# Patient Record
Sex: Female | Born: 1965
Health system: Southern US, Community
[De-identification: ages and names within clinical notes are randomized; demographics above are authoritative.]

## PROBLEM LIST (undated history)

## (undated) DIAGNOSIS — K760 Fatty (change of) liver, not elsewhere classified: Secondary | ICD-10-CM

## (undated) DIAGNOSIS — J449 Chronic obstructive pulmonary disease, unspecified: Secondary | ICD-10-CM

## (undated) DIAGNOSIS — G4730 Sleep apnea, unspecified: Secondary | ICD-10-CM

## (undated) DIAGNOSIS — C649 Malignant neoplasm of unspecified kidney, except renal pelvis: Secondary | ICD-10-CM

## (undated) DIAGNOSIS — I440 Atrioventricular block, first degree: Secondary | ICD-10-CM

## (undated) DIAGNOSIS — M1990 Unspecified osteoarthritis, unspecified site: Secondary | ICD-10-CM

## (undated) DIAGNOSIS — N289 Disorder of kidney and ureter, unspecified: Secondary | ICD-10-CM

## (undated) DIAGNOSIS — J45909 Unspecified asthma, uncomplicated: Secondary | ICD-10-CM

## (undated) DIAGNOSIS — K219 Gastro-esophageal reflux disease without esophagitis: Secondary | ICD-10-CM

## (undated) DIAGNOSIS — J309 Allergic rhinitis, unspecified: Secondary | ICD-10-CM

## (undated) DIAGNOSIS — F909 Attention-deficit hyperactivity disorder, unspecified type: Secondary | ICD-10-CM

## (undated) DIAGNOSIS — E039 Hypothyroidism, unspecified: Secondary | ICD-10-CM

## (undated) DIAGNOSIS — R251 Tremor, unspecified: Secondary | ICD-10-CM

## (undated) DIAGNOSIS — M069 Rheumatoid arthritis, unspecified: Secondary | ICD-10-CM

## (undated) DIAGNOSIS — Z8719 Personal history of other diseases of the digestive system: Secondary | ICD-10-CM

## (undated) DIAGNOSIS — E860 Dehydration: Secondary | ICD-10-CM

## (undated) DIAGNOSIS — N182 Chronic kidney disease, stage 2 (mild): Secondary | ICD-10-CM

## (undated) HISTORY — PX: WRIST SURGERY: SHX841

## (undated) HISTORY — PX: ELBOW SURGERY: SHX618

## (undated) HISTORY — DX: Chronic obstructive pulmonary disease, unspecified: J44.9

## (undated) HISTORY — PX: CHOLECYSTECTOMY: SHX55

## (undated) HISTORY — PX: APPENDECTOMY: SHX54

## (undated) HISTORY — DX: Malignant neoplasm of unspecified kidney, except renal pelvis: C64.9

## (undated) HISTORY — DX: Tremor, unspecified: R25.1

## (undated) HISTORY — DX: Disorder of kidney and ureter, unspecified: N28.9

## (undated) HISTORY — PX: KIDNEY SURGERY: SHX687

## (undated) HISTORY — DX: Sleep apnea, unspecified: G47.30

## (undated) HISTORY — PX: TOTAL SHOULDER ARTHROPLASTY: SHX126

## (undated) HISTORY — PX: SHOULDER SURGERY: SHX246

## (undated) HISTORY — DX: Unspecified asthma, uncomplicated: J45.909

## (undated) HISTORY — DX: Rheumatoid arthritis, unspecified: M06.9

## (undated) HISTORY — DX: Hypothyroidism, unspecified: E03.9

## (undated) HISTORY — DX: Dehydration: E86.0

## (undated) HISTORY — DX: Gastro-esophageal reflux disease without esophagitis: K21.9

## (undated) HISTORY — PX: TUBAL LIGATION: SHX77

## (undated) HISTORY — PX: TONSILLECTOMY: SUR1361

## (undated) HISTORY — DX: Allergic rhinitis, unspecified: J30.9

---
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
## 1984-08-28 HISTORY — PX: TUBAL LIGATION: SHX77

## 2004-08-28 HISTORY — PX: SHOULDER SURGERY: SHX246

## 2005-06-14 ENCOUNTER — Ambulatory Visit (HOSPITAL_COMMUNITY): Admission: RE | Admit: 2005-06-14 | Discharge: 2005-06-14 | Payer: Self-pay | Admitting: Orthopedic Surgery

## 2006-08-28 HISTORY — PX: KIDNEY SURGERY: SHX687

## 2007-08-29 HISTORY — PX: CHOLECYSTECTOMY: SHX55

## 2009-08-28 HISTORY — PX: TONSILLECTOMY: SUR1361

## 2010-02-17 DIAGNOSIS — G479 Sleep disorder, unspecified: Secondary | ICD-10-CM

## 2010-02-17 DIAGNOSIS — G44329 Chronic post-traumatic headache, not intractable: Secondary | ICD-10-CM

## 2010-02-17 DIAGNOSIS — F32A Depression, unspecified: Secondary | ICD-10-CM | POA: Insufficient documentation

## 2010-02-17 DIAGNOSIS — Z85528 Personal history of other malignant neoplasm of kidney: Secondary | ICD-10-CM | POA: Insufficient documentation

## 2010-02-17 DIAGNOSIS — E1169 Type 2 diabetes mellitus with other specified complication: Secondary | ICD-10-CM | POA: Insufficient documentation

## 2010-02-17 DIAGNOSIS — M4802 Spinal stenosis, cervical region: Secondary | ICD-10-CM | POA: Insufficient documentation

## 2010-02-17 DIAGNOSIS — R413 Other amnesia: Secondary | ICD-10-CM

## 2010-02-17 DIAGNOSIS — E785 Hyperlipidemia, unspecified: Secondary | ICD-10-CM

## 2010-02-17 DIAGNOSIS — M159 Polyosteoarthritis, unspecified: Secondary | ICD-10-CM | POA: Insufficient documentation

## 2010-02-17 HISTORY — DX: Depression, unspecified: F32.A

## 2010-02-17 HISTORY — DX: Chronic post-traumatic headache, not intractable: G44.329

## 2010-02-17 HISTORY — DX: Hyperlipidemia, unspecified: E78.5

## 2010-02-17 HISTORY — DX: Other amnesia: R41.3

## 2010-02-17 HISTORY — DX: Sleep disorder, unspecified: G47.9

## 2010-02-17 HISTORY — DX: Personal history of other malignant neoplasm of kidney: Z85.528

## 2010-02-17 HISTORY — DX: Type 2 diabetes mellitus with other specified complication: E11.69

## 2010-02-17 HISTORY — DX: Spinal stenosis, cervical region: M48.02

## 2015-03-01 DIAGNOSIS — E669 Obesity, unspecified: Secondary | ICD-10-CM

## 2015-03-01 HISTORY — DX: Obesity, unspecified: E66.9

## 2015-03-02 DIAGNOSIS — N182 Chronic kidney disease, stage 2 (mild): Secondary | ICD-10-CM | POA: Insufficient documentation

## 2015-04-30 DIAGNOSIS — J4550 Severe persistent asthma, uncomplicated: Secondary | ICD-10-CM | POA: Insufficient documentation

## 2015-04-30 DIAGNOSIS — J3089 Other allergic rhinitis: Secondary | ICD-10-CM | POA: Insufficient documentation

## 2015-04-30 DIAGNOSIS — K219 Gastro-esophageal reflux disease without esophagitis: Secondary | ICD-10-CM | POA: Insufficient documentation

## 2015-04-30 HISTORY — DX: Severe persistent asthma, uncomplicated: J45.50

## 2015-04-30 HISTORY — DX: Other allergic rhinitis: J30.89

## 2015-05-05 DIAGNOSIS — E039 Hypothyroidism, unspecified: Secondary | ICD-10-CM | POA: Insufficient documentation

## 2015-05-05 HISTORY — DX: Hypothyroidism, unspecified: E03.9

## 2015-05-07 ENCOUNTER — Other Ambulatory Visit: Payer: Self-pay | Admitting: *Deleted

## 2015-05-07 MED ORDER — OMALIZUMAB 150 MG ~~LOC~~ SOLR
375.0000 mg | SUBCUTANEOUS | Status: DC
  Administered 2015-08-02 – 2019-09-25 (×48): 375 mg via SUBCUTANEOUS

## 2015-05-12 DIAGNOSIS — F411 Generalized anxiety disorder: Secondary | ICD-10-CM

## 2015-05-12 HISTORY — DX: Generalized anxiety disorder: F41.1

## 2015-05-18 DIAGNOSIS — I10 Essential (primary) hypertension: Secondary | ICD-10-CM

## 2015-05-18 HISTORY — DX: Essential (primary) hypertension: I10

## 2015-05-26 ENCOUNTER — Ambulatory Visit (INDEPENDENT_AMBULATORY_CARE_PROVIDER_SITE_OTHER): Payer: Medicare Other

## 2015-05-26 DIAGNOSIS — J4550 Severe persistent asthma, uncomplicated: Secondary | ICD-10-CM

## 2015-06-17 DIAGNOSIS — F39 Unspecified mood [affective] disorder: Secondary | ICD-10-CM

## 2015-06-17 HISTORY — DX: Unspecified mood (affective) disorder: F39

## 2015-06-23 DIAGNOSIS — R079 Chest pain, unspecified: Secondary | ICD-10-CM | POA: Insufficient documentation

## 2015-06-23 HISTORY — DX: Chest pain, unspecified: R07.9

## 2015-06-24 ENCOUNTER — Encounter: Payer: Self-pay | Admitting: Allergy and Immunology

## 2015-06-24 ENCOUNTER — Ambulatory Visit (INDEPENDENT_AMBULATORY_CARE_PROVIDER_SITE_OTHER): Payer: Medicare Other | Admitting: Allergy and Immunology

## 2015-06-24 VITALS — BP 110/54 | HR 100 | Resp 20 | Ht 62.24 in | Wt 243.4 lb

## 2015-06-24 DIAGNOSIS — K219 Gastro-esophageal reflux disease without esophagitis: Secondary | ICD-10-CM

## 2015-06-24 DIAGNOSIS — G4733 Obstructive sleep apnea (adult) (pediatric): Secondary | ICD-10-CM

## 2015-06-24 DIAGNOSIS — J4550 Severe persistent asthma, uncomplicated: Secondary | ICD-10-CM | POA: Diagnosis not present

## 2015-06-24 DIAGNOSIS — G4730 Sleep apnea, unspecified: Secondary | ICD-10-CM

## 2015-06-24 DIAGNOSIS — J3089 Other allergic rhinitis: Secondary | ICD-10-CM

## 2015-06-24 MED ORDER — MONTELUKAST SODIUM 10 MG PO TABS: 10.0000 mg | ORAL_TABLET | Freq: Every day | ORAL | Status: DC

## 2015-06-24 NOTE — Patient Instructions (Addendum)
  1. Continue Symbicort 160 two inhalations two times per day + Qvar 80 two inhalations two times per day.  2. Continue Omeprazole 40 one tablet two times per day + Ranitidine 300 in evening  3. Continue Montelukast (singulair) 10 one tablet one time per day  4. Continue Zyrtec 10 one tablet one time per day  5. Continue CPAP with oxygen  6. Use Duoneb nebulization or Combivent respimat two puffs every 4-6 hours if needed.  7. Continue xolair and epi-pen  8. Get a flu vaccine  9. Return in 3 months or earlier if problem.

## 2015-06-24 NOTE — Progress Notes (Signed)
[facility]  Follow-up Note  Refering Provider: No ref. provider found Primary Provider: No primary care provider on file.  Subjective:   Kimberly Jenkins is a 49 y.o. female who returns to the [facility] in re-evaluation of the following:  HPI Comments:  Kimberly Jenkins returns to this clinic stating that she is feeling better overall. She has less coughing and less shortness of breath. She still dyspneic if she exerts herself any large degree. She still uses a nebulized ipratropium and albuterol combination 3 times per day. But in general her breathing is a lot better. She is not required any systemic steroids the past 3 months. Her nose is doing well. Her reflux is been under control. She feels better with more energy since restarting her CPAP and oxygen. As well, she apparently stopped one of her diabetic medications because she thinks she was developing problems with nausea and she is much better regarding that issue. She continues to use a combination of Symbicort and Qvar as well as omeprazole and ranitidine and montelukast. She continues on Xolair   Medication Sig  . beclomethasone (QVAR) 80 MCG/ACT inhaler Inhale 2 puffs into the lungs 2 (two) times daily.  . budesonide-formoterol (SYMBICORT) 160-4.5 MCG/ACT inhaler Inhale 2 puffs into the lungs 2 (two) times daily.  . Celecoxib (CELEBREX PO) Take by mouth.  . cetirizine (ZYRTEC) 10 MG tablet Take 10 mg by mouth daily.  . DULoxetine HCl (CYMBALTA PO) Take by mouth.  . EPINEPHrine (EPIPEN 2-PAK) 0.3 mg/0.3 mL IJ SOAJ injection Inject 0.3 mg into the muscle once.  . FUROSEMIDE PO Take by mouth.  Marland Kitchen GABAPENTIN, ONCE-DAILY, PO Take by mouth.  Marland Kitchen HYDROCODONE-ACETAMINOPHEN PO Take by mouth.  . Ipratropium-Albuterol (COMBIVENT RESPIMAT) 20-100 MCG/ACT AERS respimat Inhale 1 puff into the lungs every 6 (six) hours as needed for wheezing.  Marland Kitchen ipratropium-albuterol (DUONEB)  0.5-2.5 (3) MG/3ML SOLN Take 3 mLs by nebulization every 4 (four) hours as needed.  Marland Kitchen lisinopril (PRINIVIL,ZESTRIL) 5 MG tablet Take 5 mg by mouth daily.  . metFORMIN (GLUCOPHAGE) 500 MG tablet Take 500 mg by mouth 2 (two) times daily with a meal.  . MetFORMIN HCl (GLUCOPHAGE PO) Take by mouth.  . montelukast (SINGULAIR) 10 MG tablet Take 10 mg by mouth at bedtime.  Marland Kitchen omalizumab (XOLAIR) 150 MG injection Inject 375 mg into the skin every 14 (fourteen) days.  Marland Kitchen omeprazole (PRILOSEC) 40 MG capsule Take 40 mg by mouth 2 (two) times daily.  Marland Kitchen POTASSIUM PO Take by mouth.  . Pramipexole Dihydrochloride (MIRAPEX PO) Take by mouth.  . pravastatin (PRAVACHOL) 40 MG tablet Take 40 mg by mouth daily.  . ranitidine (ZANTAC) 300 MG capsule Take 300 mg by mouth every evening.  . triamterene-hydrochlorothiazide (DYAZIDE) 37.5-25 MG per capsule Take 1 capsule by mouth daily.   Facility-Administered Medications Prior to Visit  Medication Dose Route Frequency Provider Last Rate Last Dose  . omalizumab Arvid Right) injection 375 mg  375 mg Subcutaneous Q14 Days Jiles Prows, MD        No orders of the defined types were placed in this encounter.    Past Medical History  Diagnosis Date  . Cancer of kidney [facility])     History reviewed. No pertinent past surgical history.  Allergies  Allergen Reactions  . Advair Diskus [Fluticasone-Salmeterol]   . Aspirin   . Ciprofloxacin   . Clonidine Derivatives   . Nystatin   . Oxycodone   .  Serevent [Salmeterol]     Review of Systems  Constitutional: Negative for fever, chills and weight loss.  HENT: Negative for congestion, ear pain, nosebleeds and sore throat.   Eyes: Negative for pain, discharge and redness.  Respiratory: Positive for shortness of breath. Negative for cough, sputum production and wheezing.   Cardiovascular: Negative for chest pain and leg swelling.  Gastrointestinal: Negative for heartburn, nausea, vomiting and abdominal pain.   Musculoskeletal: Negative for myalgias and joint pain.  Skin: Negative for itching and rash.  Neurological: Negative for dizziness and headaches.  Endo/Heme/Allergies: Does not bruise/bleed easily.     Objective:   Filed Vitals:   06/24/15 1010  BP: 110/54  Pulse: 100  Resp: 20   Height: 5' 2.24" (158.1 cm)  Weight: 243 lb 6.2 oz (110.4 kg)   Physical Exam  Constitutional: She is well-developed, well-nourished, and in no distress. No distress.  HENT:  Head: Normocephalic and atraumatic. Head is without right periorbital erythema and without left periorbital erythema.  Right Ear: Tympanic membrane, external ear and ear canal normal. No drainage. No foreign bodies. Tympanic membrane is not injected, not scarred, not perforated, not erythematous, not retracted and not bulging. No middle ear effusion.  Left Ear: Tympanic membrane, external ear and ear canal normal. No drainage. No foreign bodies. Tympanic membrane is not injected, not scarred, not perforated, not erythematous, not retracted and not bulging.  No middle ear effusion.  Nose: Nose normal. No mucosal edema, rhinorrhea, nose lacerations, sinus tenderness, nasal deformity, septal deviation or nasal septal hematoma. No epistaxis.  Mouth/Throat: Oropharynx is clear and moist and mucous membranes are normal. No oropharyngeal exudate, posterior oropharyngeal edema, posterior oropharyngeal erythema or tonsillar abscesses.  Eyes: Conjunctivae and lids are normal. Pupils are equal, round, and reactive to light. Right eye exhibits no discharge and no exudate. No foreign body present in the right eye. Left eye exhibits no discharge and no exudate. No foreign body present in the left eye. Right conjunctiva is not injected. Right conjunctiva has no hemorrhage. Left conjunctiva is not injected. Left conjunctiva has no hemorrhage. No scleral icterus.  Neck: No tracheal tenderness present. No tracheal deviation present. No thyromegaly present.   Cardiovascular: Normal rate, regular rhythm, S1 normal, S2 normal and normal heart sounds.  Exam reveals no gallop and no friction rub.   No murmur heard. Pulmonary/Chest: Effort normal. No respiratory distress. She has no wheezes. She has no rhonchi. She has no rales. She exhibits no tenderness.  Musculoskeletal: She exhibits no edema or tenderness.  Lymphadenopathy:    She has no cervical adenopathy.  Skin: No purpura and no rash noted. Rash is not macular, not maculopapular, not nodular, not pustular, not vesicular and not urticarial. She is not diaphoretic. No cyanosis or erythema. No pallor. Nails show no clubbing.  Psychiatric: Mood, affect and judgment normal.    Diagnostics:    Spirometry was performed and demonstrated an FEV1 of 1.47 at 61 % of predicted.  The patient had an Asthma Control Test with the following results: ACT Total Score: 11.    Assessment and Plan:   1. Other allergic rhinitis   2. Gastroesophageal reflux disease, esophagitis presence not specified   3. Sleep apnea with use of continuous positive airway pressure (CPAP)      1. Continue Symbicort 160 two inhalations two times per day + Qvar 80 two inhalations two times per day.  2. Continue Omeprazole 40 one tablet two times per day + Ranitidine 300 in evening  3. Continue Montelukast (singulair) 10 one tablet one time per day  4. Continue Zyrtec 10 one tablet one time per day  5. Continue CPAP with oxygen  6. Use Duoneb nebulization or Combivent respimat two puffs every 4-6 hours if needed.  7. Get a flu vaccine  8. Continue xolair and epi-pen  9. Return in 3 months or earlier if problem.  Overall Kimberly Jenkins is doing okay. We'll continue to have her use the therapy mentioned above and regroup with her in 3 months or earlier if there is a problem. I've encouraged her to touch base with her endocrinologist concerning further management of her diabetes given the fact that she stop one of her diabetic  medications.      Allena Katz, MD [facility]

## 2015-07-08 DIAGNOSIS — E559 Vitamin D deficiency, unspecified: Secondary | ICD-10-CM

## 2015-07-08 HISTORY — DX: Vitamin D deficiency, unspecified: E55.9

## 2015-08-02 ENCOUNTER — Ambulatory Visit (INDEPENDENT_AMBULATORY_CARE_PROVIDER_SITE_OTHER): Payer: Medicare Other | Admitting: *Deleted

## 2015-08-02 DIAGNOSIS — J4550 Severe persistent asthma, uncomplicated: Secondary | ICD-10-CM

## 2015-09-08 ENCOUNTER — Ambulatory Visit (INDEPENDENT_AMBULATORY_CARE_PROVIDER_SITE_OTHER): Payer: Medicare Other

## 2015-09-08 DIAGNOSIS — J4550 Severe persistent asthma, uncomplicated: Secondary | ICD-10-CM | POA: Diagnosis not present

## 2015-09-23 ENCOUNTER — Ambulatory Visit (INDEPENDENT_AMBULATORY_CARE_PROVIDER_SITE_OTHER): Payer: Medicare Other | Admitting: *Deleted

## 2015-09-23 DIAGNOSIS — J4540 Moderate persistent asthma, uncomplicated: Secondary | ICD-10-CM

## 2015-09-27 ENCOUNTER — Ambulatory Visit: Payer: Medicare Other | Admitting: Allergy and Immunology

## 2015-09-27 ENCOUNTER — Ambulatory Visit (INDEPENDENT_AMBULATORY_CARE_PROVIDER_SITE_OTHER): Payer: Medicare Other | Admitting: Allergy and Immunology

## 2015-09-27 ENCOUNTER — Encounter: Payer: Self-pay | Admitting: Allergy and Immunology

## 2015-09-27 VITALS — BP 110/68 | HR 84 | Resp 16

## 2015-09-27 DIAGNOSIS — J4550 Severe persistent asthma, uncomplicated: Secondary | ICD-10-CM

## 2015-09-27 DIAGNOSIS — J387 Other diseases of larynx: Secondary | ICD-10-CM

## 2015-09-27 DIAGNOSIS — G4733 Obstructive sleep apnea (adult) (pediatric): Secondary | ICD-10-CM | POA: Diagnosis not present

## 2015-09-27 DIAGNOSIS — J3089 Other allergic rhinitis: Secondary | ICD-10-CM

## 2015-09-27 DIAGNOSIS — K219 Gastro-esophageal reflux disease without esophagitis: Secondary | ICD-10-CM

## 2015-09-27 DIAGNOSIS — E669 Obesity, unspecified: Secondary | ICD-10-CM

## 2015-09-27 DIAGNOSIS — G4730 Sleep apnea, unspecified: Secondary | ICD-10-CM

## 2015-09-27 NOTE — Progress Notes (Signed)
[facility]  Follow-up Note  Referring Provider: No ref. provider found Primary Provider: Welford Roche, NP Date of Office Visit: 09/27/2015  Subjective:   Kimberly Jenkins is a 50 y.o. female who returns to the [facility] on 09/27/2015 in re-evaluation of the following:  HPI Comments: Kimberly Jenkins returns to this clinic in reevaluation of her severe asthma treated with Xolair, LPR, and allergic rhinitis, and sleep apnea treated with CPAP and oxygen. She continues to have problems with wheezing and coughing and uses a bronchodilator at least 4 times per day usually with DuoNeb. She is out of breath if she exerts herself any large degree. Most recently she did go to the emergency room with an episode of asthma for which she was treated with prednisone. Apparently this episode was triggered off by an rhinitis which sounds as though she may of had a viral respiratory tract infection. In addition, she's been gaining weight. She is now visiting an endocrinologist for her diabetes and she's been treated for hypothyroidism. In addition, she's now visiting a nephrologist that she apparently has some degree of kidney disease whether that be from diabetes or some other source. Her nose is not really been causing her any problem. Her reflexes been under control. She's been using her CPAP.   Current Outpatient Prescriptions on File Prior to Visit  Medication Sig Dispense Refill  . beclomethasone (QVAR) 80 MCG/ACT inhaler Inhale 2 puffs into the lungs 2 (two) times daily.    . Brexpiprazole (REXULTI) 1 MG TABS Take 1 mg by mouth at bedtime.    . budesonide-formoterol (SYMBICORT) 160-4.5 MCG/ACT inhaler Inhale 2 puffs into the lungs 2 (two) times daily.    . celecoxib (CELEBREX) 200 MG capsule 200 mg.    . cetirizine (ZYRTEC) 10 MG tablet Take 10 mg by mouth daily.    . DULoxetine HCl (CYMBALTA PO) Take by mouth.    . EPINEPHrine (EPIPEN  2-PAK) 0.3 mg/0.3 mL IJ SOAJ injection Inject 0.3 mg into the muscle once.    . furosemide (LASIX) 40 MG tablet Take 40 mg by mouth.    . gabapentin (NEURONTIN) 400 MG capsule Take 400 mg by mouth 2 (two) times daily.     Marland Kitchen HYDROcodone-acetaminophen (NORCO) 10-325 MG tablet Take by mouth.    . Insulin Glargine (LANTUS SOLOSTAR) 100 UNIT/ML Solostar Pen 85 Units nightly.    . Ipratropium-Albuterol (COMBIVENT RESPIMAT) 20-100 MCG/ACT AERS respimat Inhale 1 puff into the lungs every 6 (six) hours as needed for wheezing.    Marland Kitchen ipratropium-albuterol (DUONEB) 0.5-2.5 (3) MG/3ML SOLN Take 3 mLs by nebulization every 4 (four) hours as needed.    Marland Kitchen lisinopril (PRINIVIL,ZESTRIL) 5 MG tablet Take 5 mg by mouth daily.    . metFORMIN (GLUCOPHAGE) 500 MG tablet Take 500 mg by mouth 2 (two) times daily with a meal.    . montelukast (SINGULAIR) 10 MG tablet Take 1 tablet (10 mg total) by mouth daily. 30 tablet 5  . nystatin cream (MYCOSTATIN)     . olopatadine (PATANOL) 0.1 % ophthalmic solution     . omalizumab (XOLAIR) 150 MG injection Inject 375 mg into the skin every 14 (fourteen) days.    Marland Kitchen omeprazole (PRILOSEC) 40 MG capsule Take 40 mg by mouth 2 (two) times daily.    . potassium chloride SA (K-DUR,KLOR-CON) 20 MEQ tablet Take by mouth.    . pramipexole (MIRAPEX) 0.125 MG tablet Take by mouth.    Marland Kitchen  pravastatin (PRAVACHOL) 40 MG tablet Take 40 mg by mouth daily.    . ranitidine (ZANTAC) 300 MG capsule Take 300 mg by mouth every evening.    . rizatriptan (MAXALT) 10 MG tablet Take 10 mg by mouth.    . triamcinolone cream (KENALOG) 0.1 %     . triamterene-hydrochlorothiazide (DYAZIDE) 37.5-25 MG per capsule Take 1 capsule by mouth daily.    . Vitamin D, Ergocalciferol, (DRISDOL) 50000 UNITS CAPS capsule Take by mouth every 7 (seven) days.     . Insulin Pen Needle (EASY TOUCH PEN NEEDLES) 31G X 8 MM MISC      Current Facility-Administered Medications on File Prior to Visit  Medication Dose Route Frequency  Provider Last Rate Last Dose  . omalizumab Arvid Right) injection 375 mg  375 mg Subcutaneous Q14 Days Jiles Prows, MD   375 mg at 09/23/15 1359    No orders of the defined types were placed in this encounter.    Past Medical History  Diagnosis Date  . Cancer of kidney ([facility])   . Kidney disease   . Dehydration   . Asthma   . GERD (gastroesophageal reflux disease)   . Sleep apnea   . Hypothyroidism   . Allergic rhinitis     Past Surgical History  Procedure Laterality Date  . Tonsillectomy    . Cesarean section      2 times  . Cholecystectomy    . Kidney surgery    . Appendectomy    . Shoulder surgery      Allergies  Allergen Reactions  . Advair Diskus [Fluticasone-Salmeterol]   . Aspirin   . Ciprofloxacin   . Clonidine Derivatives   . Nystatin   . Oxycodone   . Serevent [Salmeterol]     Review of systems negative except as noted in HPI / PMHx or noted below:  Review of Systems  Constitutional: Negative.   HENT: Negative.   Eyes: Negative.   Respiratory: Negative.   Cardiovascular: Negative.   Gastrointestinal: Negative.   Genitourinary: Negative.   Musculoskeletal: Negative.   Skin: Negative.   Neurological: Negative.   Endo/Heme/Allergies: Negative.   Psychiatric/Behavioral: Negative.      Objective:   Filed Vitals:   09/27/15 1523  BP: 110/68  Pulse: 84  Resp: 16          Physical Exam  Constitutional: She is well-developed, well-nourished, and in no distress.  HENT:  Head: Normocephalic. Head is without right periorbital erythema and without left periorbital erythema.  Right Ear: Tympanic membrane, external ear and ear canal normal.  Left Ear: Tympanic membrane, external ear and ear canal normal.  Nose: Nose normal. No mucosal edema or rhinorrhea.  Mouth/Throat: Oropharynx is clear and moist and mucous membranes are normal. No oropharyngeal exudate.  Eyes: Conjunctivae and lids are normal. Pupils are equal, round, and reactive to light.   Neck: Trachea normal. No tracheal deviation present. No thyromegaly present.  Cardiovascular: Normal rate, regular rhythm, S1 normal, S2 normal and normal heart sounds.   No murmur heard. Pulmonary/Chest: Effort normal. No stridor. No tachypnea. No respiratory distress. She has no wheezes. She has no rales. She exhibits no tenderness.  Abdominal: Soft. She exhibits no distension and no mass. There is no hepatosplenomegaly. There is no tenderness. There is no rebound and no guarding.  Musculoskeletal: She exhibits edema (ankles mid shin). She exhibits no tenderness.  Lymphadenopathy:       Head (right side): No tonsillar adenopathy present.  Head (left side): No tonsillar adenopathy present.    She has no cervical adenopathy.    She has no axillary adenopathy.  Neurological: She is alert. Gait normal.  Skin: No rash noted. She is not diaphoretic. No erythema. No pallor. Nails show no clubbing.  Psychiatric: Mood and affect normal.    Diagnostics:    Spirometry was performed and demonstrated an FEV1 of 1.15 at 45 % of predicted.  Oxygen saturation was 96% on room air at rest. With exercise up and down the hallway her oxygen saturation rose to 97% on room air.  The patient had an Asthma Control Test with the following results: ACT Total Score: 11.    Assessment and Plan:   1. Severe persistent asthma, uncomplicated   2. Other allergic rhinitis   3. LPRD (laryngopharyngeal reflux disease)   4. Sleep apnea with use of continuous positive airway pressure (CPAP)   5. Obesity     1. Continue Symbicort 160 two inhalations two times per day + Qvar 80 two inhalations two times per day.  2. Continue Omeprazole 40 one tablet two times per day + Ranitidine 300 in evening  3. Continue Montelukast (singulair) 10 one tablet one time per day  4. Continue Zyrtec 10 one tablet one time per day  5. Continue CPAP with oxygen  6. Use Duoneb nebulization or Combivent respimat two puffs  every 4-6 hours if needed.  7. Continue xolair and epi-pen  8. Follow up with endocrinologist and nephrologist - Weight? Fluid?  9. Return in 3 months or earlier if problem.  I will have Kimberly Jenkins continue to use high-dose inhaled steroids and Xolair to treat her atopic respiratory disease and high dose omeprazole and ranitidine to treat her reflux-induced respiratory disease. I think she would obviously do better she had a little more attention to her weight and there may also be some fluid overload issues given her kidney disease. I really do not want to give her any more systemic steroids given her diabetes and weight issue. She has problems over the next 3 months she'll contact me for further evaluation but otherwise I'll see her back in this clinic in 3 months.  Allena Katz, MD [facility]

## 2015-09-27 NOTE — Patient Instructions (Signed)
  1. Continue Symbicort 160 two inhalations two times per day + Qvar 80 two inhalations two times per day.  2. Continue Omeprazole 40 one tablet two times per day + Ranitidine 300 in evening  3. Continue Montelukast (singulair) 10 one tablet one time per day  4. Continue Zyrtec 10 one tablet one time per day  5. Continue CPAP with oxygen  6. Use Duoneb nebulization or Combivent respimat two puffs every 4-6 hours if needed.  7. Continue xolair and epi-pen  8. Follow up with endocrinologist and nephrologist - Weight? Fluid?  9. Return in 3 months or earlier if problem.

## 2015-10-11 ENCOUNTER — Ambulatory Visit (INDEPENDENT_AMBULATORY_CARE_PROVIDER_SITE_OTHER): Payer: Medicare Other

## 2015-10-11 DIAGNOSIS — J4540 Moderate persistent asthma, uncomplicated: Secondary | ICD-10-CM

## 2015-10-25 ENCOUNTER — Ambulatory Visit (INDEPENDENT_AMBULATORY_CARE_PROVIDER_SITE_OTHER): Payer: Medicare Other | Admitting: *Deleted

## 2015-10-25 DIAGNOSIS — J4550 Severe persistent asthma, uncomplicated: Secondary | ICD-10-CM | POA: Diagnosis not present

## 2015-11-03 DIAGNOSIS — D649 Anemia, unspecified: Secondary | ICD-10-CM | POA: Diagnosis not present

## 2015-11-03 DIAGNOSIS — D72829 Elevated white blood cell count, unspecified: Secondary | ICD-10-CM | POA: Diagnosis not present

## 2015-11-17 ENCOUNTER — Ambulatory Visit (INDEPENDENT_AMBULATORY_CARE_PROVIDER_SITE_OTHER): Payer: Medicare Other | Admitting: *Deleted

## 2015-11-17 DIAGNOSIS — J4540 Moderate persistent asthma, uncomplicated: Secondary | ICD-10-CM | POA: Diagnosis not present

## 2015-12-01 ENCOUNTER — Ambulatory Visit (INDEPENDENT_AMBULATORY_CARE_PROVIDER_SITE_OTHER): Payer: Medicare Other

## 2015-12-01 DIAGNOSIS — J4540 Moderate persistent asthma, uncomplicated: Secondary | ICD-10-CM | POA: Diagnosis not present

## 2015-12-20 ENCOUNTER — Ambulatory Visit (INDEPENDENT_AMBULATORY_CARE_PROVIDER_SITE_OTHER): Payer: Medicare Other | Admitting: *Deleted

## 2015-12-20 DIAGNOSIS — J4540 Moderate persistent asthma, uncomplicated: Secondary | ICD-10-CM

## 2015-12-27 ENCOUNTER — Encounter: Payer: Self-pay | Admitting: Allergy and Immunology

## 2015-12-27 ENCOUNTER — Ambulatory Visit (INDEPENDENT_AMBULATORY_CARE_PROVIDER_SITE_OTHER): Payer: Medicare Other | Admitting: Allergy and Immunology

## 2015-12-27 VITALS — BP 96/60 | HR 104 | Resp 20

## 2015-12-27 DIAGNOSIS — J4550 Severe persistent asthma, uncomplicated: Secondary | ICD-10-CM

## 2015-12-27 DIAGNOSIS — K219 Gastro-esophageal reflux disease without esophagitis: Secondary | ICD-10-CM

## 2015-12-27 DIAGNOSIS — J387 Other diseases of larynx: Secondary | ICD-10-CM | POA: Diagnosis not present

## 2015-12-27 NOTE — Progress Notes (Signed)
Follow-up Note  Referring Provider: Welford Roche, NP Primary Provider: Welford Roche, NP Date of Office Visit: 12/27/2015  Subjective:   Kimberly Jenkins (DOB: 22-Nov-1965) is a 50 y.o. female who returns to the [facility] on 12/27/2015 in re-evaluation of the following:  HPI: Kimberly Jenkins returns to this clinic in reevaluation of her asthma, allergic rhinitis, and LPR. I last saw her in his clinic in January 2017. At that point we placed her on a combination of therapy to address each one of her issues while she continues to use Xolair.  Apparently a lot of her medications have been changed. She was placed on Flovent in addition to Qvar and then she was placed on Brie. It appears as though Memory Dance is the only inhaler she is using at this point in time. She still uses her bronchodilator 4 times per day. In addition, her ranitidine was changed to Pepcid. She continues on omeprazole 40 mg twice a day.  Her nose is been doing relatively well. She takes her reflux is been doing relatively well. She has not visited with her nephrologist at this point in time although she has an appointment coming up in May. She was removed from her Celebrex as per the recommendation of the nephrologist and she's been having lots of pain across her body ever since that removal. She has not been removed from her omeprazole to date.    Medication List           BREO ELLIPTA IN  Inhale 1 puff into the lungs daily.     busPIRone 15 MG tablet  Commonly known as:  BUSPAR  Take one tablet three times daily.     celecoxib 200 MG capsule  Commonly known as:  CELEBREX  200 mg.     cetirizine 10 MG tablet  Commonly known as:  ZYRTEC  Take 10 mg by mouth daily.     CYMBALTA PO  Take by mouth.     EASY TOUCH PEN NEEDLES 31G X 8 MM Misc  Generic drug:  Insulin Pen Needle     EPIPEN 2-PAK 0.3 mg/0.3 mL Soaj injection  Generic drug:  EPINEPHrine  Inject 0.3 mg into the muscle once.     furosemide 40 MG tablet  Commonly known as:  LASIX  Take 40 mg by mouth.     gabapentin 400 MG capsule  Commonly known as:  NEURONTIN  Take 400 mg by mouth 2 (two) times daily.     HYDROcodone-acetaminophen 10-325 MG tablet  Commonly known as:  NORCO  Take by mouth.     ipratropium-albuterol 0.5-2.5 (3) MG/3ML Soln  Commonly known as:  DUONEB  Take 3 mLs by nebulization every 4 (four) hours as needed.     COMBIVENT RESPIMAT 20-100 MCG/ACT Aers respimat  Generic drug:  Ipratropium-Albuterol  Inhale 1 puff into the lungs every 6 (six) hours as needed for wheezing.     LANTUS SOLOSTAR 100 UNIT/ML Solostar Pen  Generic drug:  Insulin Glargine  85 Units nightly.     levothyroxine 50 MCG tablet  Commonly known as:  SYNTHROID, LEVOTHROID  TAKE 1 TABLET BY MOUTH EVERY DAY AT 6 AM     lisinopril 5 MG tablet  Commonly known as:  PRINIVIL,ZESTRIL  Take 5 mg by mouth daily.     metFORMIN 500 MG tablet  Commonly known as:  GLUCOPHAGE  Take 500 mg by mouth 2 (two) times daily with a meal.  montelukast 10 MG tablet  Commonly known as:  SINGULAIR  Take 1 tablet (10 mg total) by mouth daily.     nystatin cream  Commonly known as:  MYCOSTATIN     olopatadine 0.1 % ophthalmic solution  Commonly known as:  PATANOL     omeprazole 40 MG capsule  Commonly known as:  PRILOSEC  Take 40 mg by mouth 2 (two) times daily.     potassium chloride SA 20 MEQ tablet  Commonly known as:  K-DUR,KLOR-CON  Take by mouth.     pramipexole 0.125 MG tablet  Commonly known as:  MIRAPEX  Take by mouth.     pravastatin 40 MG tablet  Commonly known as:  PRAVACHOL  Take 40 mg by mouth daily.     ranitidine 300 MG capsule  Commonly known as:  ZANTAC  Take 300 mg by mouth every evening.     REXULTI 1 MG Tabs  Generic drug:  Brexpiprazole  Take 1 mg by mouth at bedtime.     rizatriptan 10 MG tablet  Commonly known as:  MAXALT  Take 10 mg by mouth.     TRANXENE-T 3.75 MG tablet  Generic  drug:  clorazepate  Take 3.75 mg by mouth.     triamcinolone cream 0.1 %  Commonly known as:  KENALOG     triamterene-hydrochlorothiazide 37.5-25 MG capsule  Commonly known as:  DYAZIDE  Take 1 capsule by mouth daily.     VICTOZA 18 MG/3ML Sopn  Generic drug:  Liraglutide  Inject 1.8 mg into the skin.     Vitamin D (Ergocalciferol) 50000 units Caps capsule  Commonly known as:  DRISDOL  Take by mouth every 7 (seven) days.     XOLAIR 150 MG injection  Generic drug:  omalizumab  Inject 375 mg into the skin every 14 (fourteen) days.        Past Medical History  Diagnosis Date  . Cancer of kidney ([place])   . Kidney disease   . Dehydration   . Asthma   . GERD (gastroesophageal reflux disease)   . Sleep apnea   . Hypothyroidism   . Allergic rhinitis     Past Surgical History  Procedure Laterality Date  . Tonsillectomy    . Cesarean section      2 times  . Cholecystectomy    . Kidney surgery    . Appendectomy    . Shoulder surgery      Allergies  Allergen Reactions  . Advair Diskus [Fluticasone-Salmeterol]   . Aspirin   . Ciprofloxacin   . Clonidine Derivatives   . Nystatin   . Oxycodone   . Serevent [Salmeterol]   . Cephalexin Nausea And Vomiting    Review of systems negative except as noted in HPI / PMHx or noted below:  Review of Systems  Constitutional: Negative.   HENT: Negative.   Eyes: Negative.   Respiratory: Negative.   Cardiovascular: Negative.   Gastrointestinal: Negative.   Genitourinary: Negative.   Musculoskeletal: Negative.   Skin: Negative.   Neurological: Negative.   Endo/Heme/Allergies: Negative.   Psychiatric/Behavioral: Negative.      Objective:   Filed Vitals:   12/27/15 1057  BP: 96/60  Pulse: 104  Resp: 20          Physical Exam  Constitutional: She is well-developed, well-nourished, and in no distress.  HENT:  Head: Normocephalic.  Right Ear: Tympanic membrane, external ear and ear canal normal.  Left Ear:  Tympanic membrane, external ear  and ear canal normal.  Nose: Nose normal. No mucosal edema or rhinorrhea.  Mouth/Throat: Uvula is midline, oropharynx is clear and moist and mucous membranes are normal. No oropharyngeal exudate.  Eyes: Conjunctivae are normal.  Neck: Trachea normal. No tracheal tenderness present. No tracheal deviation present. No thyromegaly present.  Cardiovascular: Normal rate, regular rhythm, S1 normal, S2 normal and normal heart sounds.   No murmur heard. Pulmonary/Chest: Breath sounds normal. No stridor. No respiratory distress. She has no wheezes. She has no rales.  Musculoskeletal: She exhibits no edema.  Lymphadenopathy:       Head (right side): No tonsillar adenopathy present.       Head (left side): No tonsillar adenopathy present.    She has no cervical adenopathy.  Neurological: She is alert. Gait normal.  Skin: No rash noted. She is not diaphoretic. No erythema. Nails show no clubbing.  Psychiatric: Mood and affect normal.    Diagnostics:    Spirometry was performed and demonstrated an FEV1 of 1.57 at 61 % of predicted.  The patient had an Asthma Control Test with the following results: ACT Total Score: 13.    Assessment and Plan:   1. Severe persistent asthma, uncomplicated   2. LPRD (laryngopharyngeal reflux disease)   3. Gastroesophageal reflux disease, esophagitis presence not specified     1. Continue Breo as prescribed by Dr. Alcide Clever  2. Continue Omeprazole 40 one tablet two times per day + Pepcid in evening  3. Continue Montelukast (singulair) 10 one tablet one time per day  4. Continue Zyrtec 10 one tablet one time per day  6. Use Duoneb nebulization or Combivent respimat two puffs every 4-6 hours if needed.  7. Continue xolair and epi-pen  8. Follow up with nephrologist  - omeprazole?  9. Return in 6 months or earlier if problem.  Kimberly Jenkins appears to be better from her last visit. Her spirometry is better. She is not as short of breath  although certainly still uses a bronchodilator multiple times a day. It does appear as though her medical therapy is being directed by Dr. Alcide Clever and in an attempt to not duplicate medical care I'm going to just see her back in this clinic in 6 months to assess her response to Xolair and her other medical therapy can be handled by her pulmonologist and primary care physician. Of note, she does apparently have some kidney disease and is using omeprazole. If her kidney disease does not respond to elimination of the Celebrex that was removed last month then maybe there should be consideration removing omeprazole.  Allena Katz, MD [place]

## 2015-12-27 NOTE — Patient Instructions (Signed)
  1. Continue Breo as prescribed by Dr. Alcide Clever  2. Continue Omeprazole 40 one tablet two times per day + Pepcid in evening  3. Continue Montelukast (singulair) 10 one tablet one time per day  4. Continue Zyrtec 10 one tablet one time per day  6. Use Duoneb nebulization or Combivent respimat two puffs every 4-6 hours if needed.  7. Continue xolair and epi-pen  8. Follow up with nephrologist  - omeprazole?  9. Return in 6 months or earlier if problem.

## 2016-01-06 ENCOUNTER — Ambulatory Visit (INDEPENDENT_AMBULATORY_CARE_PROVIDER_SITE_OTHER): Payer: Medicare Other

## 2016-01-06 DIAGNOSIS — J4540 Moderate persistent asthma, uncomplicated: Secondary | ICD-10-CM

## 2016-01-27 ENCOUNTER — Ambulatory Visit (INDEPENDENT_AMBULATORY_CARE_PROVIDER_SITE_OTHER): Payer: Medicare Other

## 2016-01-27 DIAGNOSIS — J4550 Severe persistent asthma, uncomplicated: Secondary | ICD-10-CM | POA: Diagnosis not present

## 2016-02-10 ENCOUNTER — Ambulatory Visit (INDEPENDENT_AMBULATORY_CARE_PROVIDER_SITE_OTHER): Payer: Medicare Other

## 2016-02-10 DIAGNOSIS — J4550 Severe persistent asthma, uncomplicated: Secondary | ICD-10-CM

## 2016-03-13 ENCOUNTER — Ambulatory Visit (INDEPENDENT_AMBULATORY_CARE_PROVIDER_SITE_OTHER): Payer: Medicare Other | Admitting: *Deleted

## 2016-03-13 DIAGNOSIS — J4550 Severe persistent asthma, uncomplicated: Secondary | ICD-10-CM

## 2016-04-03 ENCOUNTER — Ambulatory Visit (INDEPENDENT_AMBULATORY_CARE_PROVIDER_SITE_OTHER): Payer: Medicare Other | Admitting: *Deleted

## 2016-04-03 DIAGNOSIS — J4540 Moderate persistent asthma, uncomplicated: Secondary | ICD-10-CM

## 2016-04-17 ENCOUNTER — Ambulatory Visit (INDEPENDENT_AMBULATORY_CARE_PROVIDER_SITE_OTHER): Payer: Medicare Other | Admitting: *Deleted

## 2016-04-17 DIAGNOSIS — J4540 Moderate persistent asthma, uncomplicated: Secondary | ICD-10-CM | POA: Diagnosis not present

## 2016-05-02 DIAGNOSIS — Z9181 History of falling: Secondary | ICD-10-CM | POA: Insufficient documentation

## 2016-05-02 DIAGNOSIS — R251 Tremor, unspecified: Secondary | ICD-10-CM | POA: Insufficient documentation

## 2016-05-02 DIAGNOSIS — R260 Ataxic gait: Secondary | ICD-10-CM | POA: Insufficient documentation

## 2016-05-02 HISTORY — DX: Ataxic gait: R26.0

## 2016-05-02 HISTORY — DX: History of falling: Z91.81

## 2016-05-03 DIAGNOSIS — S91109A Unspecified open wound of unspecified toe(s) without damage to nail, initial encounter: Secondary | ICD-10-CM | POA: Insufficient documentation

## 2016-05-03 HISTORY — DX: Unspecified open wound of unspecified toe(s) without damage to nail, initial encounter: S91.109A

## 2016-06-08 ENCOUNTER — Ambulatory Visit (INDEPENDENT_AMBULATORY_CARE_PROVIDER_SITE_OTHER): Payer: Medicare Other | Admitting: *Deleted

## 2016-06-08 DIAGNOSIS — J4540 Moderate persistent asthma, uncomplicated: Secondary | ICD-10-CM

## 2016-06-15 ENCOUNTER — Encounter: Payer: Self-pay | Admitting: Rheumatology

## 2016-06-15 ENCOUNTER — Ambulatory Visit: Payer: Medicare Other | Admitting: Rheumatology

## 2016-06-15 ENCOUNTER — Other Ambulatory Visit: Payer: Self-pay | Admitting: Rheumatology

## 2016-06-15 ENCOUNTER — Ambulatory Visit (INDEPENDENT_AMBULATORY_CARE_PROVIDER_SITE_OTHER): Payer: Medicare Other | Admitting: Rheumatology

## 2016-06-15 ENCOUNTER — Ambulatory Visit (HOSPITAL_COMMUNITY)
Admission: RE | Admit: 2016-06-15 | Discharge: 2016-06-15 | Disposition: A | Discharge status: Home / Self Care | Payer: Medicare Other | Source: Ambulatory Visit | Attending: Rheumatology | Admitting: Rheumatology

## 2016-06-15 DIAGNOSIS — M79641 Pain in right hand: Secondary | ICD-10-CM

## 2016-06-15 DIAGNOSIS — M79642 Pain in left hand: Secondary | ICD-10-CM | POA: Diagnosis not present

## 2016-06-15 DIAGNOSIS — Z139 Encounter for screening, unspecified: Secondary | ICD-10-CM | POA: Insufficient documentation

## 2016-06-15 DIAGNOSIS — M545 Low back pain: Secondary | ICD-10-CM | POA: Diagnosis not present

## 2016-06-15 DIAGNOSIS — M79671 Pain in right foot: Secondary | ICD-10-CM | POA: Diagnosis not present

## 2016-06-15 DIAGNOSIS — Z5189 Encounter for other specified aftercare: Secondary | ICD-10-CM

## 2016-06-15 DIAGNOSIS — R700 Elevated erythrocyte sedimentation rate: Secondary | ICD-10-CM | POA: Diagnosis not present

## 2016-06-15 DIAGNOSIS — M79672 Pain in left foot: Secondary | ICD-10-CM | POA: Diagnosis not present

## 2016-06-15 DIAGNOSIS — M542 Cervicalgia: Secondary | ICD-10-CM | POA: Diagnosis not present

## 2016-06-20 ENCOUNTER — Telehealth: Payer: Self-pay | Admitting: Radiology

## 2016-06-20 NOTE — Telephone Encounter (Signed)
I have not called patient/ I have received her new patient labs/ and she has appt to review. I have placed her labs in the new patient referral folder on [employer].  They can also be accessed via [facility] lab computer if needed.

## 2016-06-21 DIAGNOSIS — D509 Iron deficiency anemia, unspecified: Secondary | ICD-10-CM | POA: Diagnosis not present

## 2016-06-23 ENCOUNTER — Telehealth: Payer: Self-pay | Admitting: Radiology

## 2016-06-23 LAB — PTH, INTACT AND CALCIUM

## 2016-06-23 NOTE — Telephone Encounter (Signed)
I have asked Kimberly Jenkins to call her to come back in.

## 2016-06-23 NOTE — Telephone Encounter (Signed)
Patients PTH was received frozen at the lab, which is appropriate, however the lab allowed the specimen to thaw prior to performing test, so the test could not be performed. >i will call patient to come back for this to be drawn again

## 2016-06-27 ENCOUNTER — Other Ambulatory Visit: Payer: Self-pay | Admitting: Rheumatology

## 2016-06-27 DIAGNOSIS — R5382 Chronic fatigue, unspecified: Secondary | ICD-10-CM

## 2016-06-28 LAB — PTH, INTACT AND CALCIUM
CALCIUM: 9 mg/dL (ref 8.6–10.4)
PTH: 52 pg/mL (ref 14–64)

## 2016-06-29 ENCOUNTER — Ambulatory Visit (INDEPENDENT_AMBULATORY_CARE_PROVIDER_SITE_OTHER): Payer: Medicare Other | Admitting: Allergy and Immunology

## 2016-06-29 ENCOUNTER — Encounter: Payer: Self-pay | Admitting: Allergy and Immunology

## 2016-06-29 VITALS — BP 108/68 | HR 80 | Resp 20

## 2016-06-29 DIAGNOSIS — J4550 Severe persistent asthma, uncomplicated: Secondary | ICD-10-CM | POA: Diagnosis not present

## 2016-06-29 DIAGNOSIS — K219 Gastro-esophageal reflux disease without esophagitis: Secondary | ICD-10-CM

## 2016-06-29 DIAGNOSIS — J3089 Other allergic rhinitis: Secondary | ICD-10-CM | POA: Diagnosis not present

## 2016-06-29 NOTE — Progress Notes (Signed)
Labs normal.

## 2016-06-29 NOTE — Progress Notes (Signed)
Follow-up Note  Referring Provider: Welford Roche, NP Primary Provider: Welford Roche, NP Date of Office Visit: 06/29/2016  Subjective:   Kimberly Jenkins (DOB: 02/27/1966) is a 50 y.o. female who returns to the [facility] on 06/29/2016 in re-evaluation of the following:  HPI: Kimberly Jenkins returns to this clinic in reevaluation of her asthma treated with omalizumab, allergic rhinitis, and LPR. I last saw her in his clinic in May 2017.  Although she continues to use DuoNeb about 3 times per day she has not required a systemic steroid to treat an exacerbation of her asthma since I've last seen her in his clinic. She does continue on Xolair and Breo at this point in time.  Her nose has not been causing her much of a problem and she has not required an antibiotic to treat an episode of sinusitis.  Her reflux is under very good control at this point in time.  During her last visit with me she mentioned that she saw a nephrologist for problems with her kidneys and she had her Celebrex removed and apparently her kidney function is better as there is no additional manipulation that has been required by the nephrologist to treat her issue. She still remains on omeprazole.  Kimberly Jenkins has apparently developed rheumatoid arthritis over the course of the past 3 months. She is now seeing a rheumatologist in [place] and was started on prednisone 3 weeks ago to help with her significantly involved small joint of her hands. She's had some benefit while using prednisone.    Medication List      BREO ELLIPTA IN Inhale 1 puff into the lungs daily.   busPIRone 15 MG tablet Commonly known as:  BUSPAR Take one tablet three times daily.   cetirizine 10 MG tablet Commonly known as:  ZYRTEC Take 10 mg by mouth daily.   EASY TOUCH PEN NEEDLES 31G X 8 MM Misc Generic drug:  Insulin Pen Needle   EPIPEN 2-PAK 0.3 mg/0.3 mL Soaj injection Generic drug:  EPINEPHrine Inject 0.3 mg into the  muscle once.   famotidine 20 MG tablet Commonly known as:  PEPCID Take 1 tablet by mouth at bedtime.   furosemide 40 MG tablet Commonly known as:  LASIX Take 40 mg by mouth.   gabapentin 400 MG capsule Commonly known as:  NEURONTIN Take 400 mg by mouth 2 (two) times daily.   HYDROcodone-acetaminophen 10-325 MG tablet Commonly known as:  NORCO Take by mouth.   ipratropium-albuterol 0.5-2.5 (3) MG/3ML Soln Commonly known as:  DUONEB Take 3 mLs by nebulization every 4 (four) hours as needed.   COMBIVENT RESPIMAT 20-100 MCG/ACT Aers respimat Generic drug:  Ipratropium-Albuterol Inhale 1 puff into the lungs every 6 (six) hours as needed for wheezing.   LANTUS SOLOSTAR 100 UNIT/ML Solostar Pen Generic drug:  Insulin Glargine 85 Units nightly.   levothyroxine 50 MCG tablet Commonly known as:  SYNTHROID, LEVOTHROID TAKE 1 TABLET BY MOUTH EVERY DAY AT 6 AM   metFORMIN 500 MG tablet Commonly known as:  GLUCOPHAGE Take 500 mg by mouth 2 (two) times daily with a meal.   montelukast 10 MG tablet Commonly known as:  SINGULAIR Take 1 tablet (10 mg total) by mouth daily.   nystatin cream Commonly known as:  MYCOSTATIN   olopatadine 0.1 % ophthalmic solution Commonly known as:  PATANOL   omeprazole 40 MG capsule Commonly known as:  PRILOSEC Take 40 mg by mouth 2 (two) times daily.   potassium  chloride SA 20 MEQ tablet Commonly known as:  K-DUR,KLOR-CON Take by mouth.   pramipexole 0.125 MG tablet Commonly known as:  MIRAPEX Take by mouth.   pravastatin 40 MG tablet Commonly known as:  PRAVACHOL Take 40 mg by mouth daily.   rizatriptan 10 MG tablet Commonly known as:  MAXALT Take 10 mg by mouth.   triamcinolone cream 0.1 % Commonly known as:  KENALOG   triamterene-hydrochlorothiazide 37.5-25 MG capsule Commonly known as:  DYAZIDE Take 1 capsule by mouth daily.   VICTOZA 18 MG/3ML Sopn Generic drug:  liraglutide Inject 1.8 mg into the skin.   Vitamin D  (Ergocalciferol) 50000 units Caps capsule Commonly known as:  DRISDOL Take by mouth every 7 (seven) days.   XOLAIR 150 MG injection Generic drug:  omalizumab Inject 375 mg into the skin every 14 (fourteen) days.       Past Medical History:  Diagnosis Date  . Allergic rhinitis   . Asthma   . Cancer of kidney ([place])   . Dehydration   . GERD (gastroesophageal reflux disease)   . Hypothyroidism   . Kidney disease   . Sleep apnea     Past Surgical History:  Procedure Laterality Date  . APPENDECTOMY    . CESAREAN SECTION     2 times  . CHOLECYSTECTOMY    . KIDNEY SURGERY    . SHOULDER SURGERY    . TONSILLECTOMY      Allergies  Allergen Reactions  . Advair Diskus [Fluticasone-Salmeterol]   . Aspirin   . Ciprofloxacin   . Clonidine Derivatives   . Nystatin   . Oxycodone   . Serevent [Salmeterol]   . Cephalexin Nausea And Vomiting    Review of systems negative except as noted in HPI / PMHx or noted below:  Review of Systems  Constitutional: Negative.   HENT: Negative.   Eyes: Negative.   Respiratory: Negative.   Cardiovascular: Negative.   Gastrointestinal: Negative.   Genitourinary: Negative.   Musculoskeletal: Negative.   Skin: Negative.   Neurological: Negative.   Endo/Heme/Allergies: Negative.   Psychiatric/Behavioral: Negative.      Objective:   Vitals:   06/29/16 1109  BP: 108/68  Pulse: 80  Resp: 20          Physical Exam  Constitutional: She is well-developed, well-nourished, and in no distress.  HENT:  Head: Normocephalic.  Right Ear: Tympanic membrane, external ear and ear canal normal.  Left Ear: Tympanic membrane, external ear and ear canal normal.  Nose: Nose normal. No mucosal edema or rhinorrhea.  Mouth/Throat: Uvula is midline, oropharynx is clear and moist and mucous membranes are normal. No oropharyngeal exudate.  Eyes: Conjunctivae are normal.  Neck: Trachea normal. No tracheal tenderness present. No tracheal deviation  present. No thyromegaly present.  Cardiovascular: Normal rate, regular rhythm, S1 normal, S2 normal and normal heart sounds.   No murmur heard. Pulmonary/Chest: Breath sounds normal. No stridor. No respiratory distress. She has no wheezes. She has no rales.  Musculoskeletal: She exhibits no edema.  Lymphadenopathy:       Head (right side): No tonsillar adenopathy present.       Head (left side): No tonsillar adenopathy present.    She has no cervical adenopathy.  Neurological: She is alert. Gait normal.  Skin: No rash noted. She is not diaphoretic. No erythema. Nails show no clubbing.  Psychiatric: Mood and affect normal.    Diagnostics:    Spirometry was performed and demonstrated an FEV1 of 1.45 at  57 % of predicted.  The patient had an Asthma Control Test with the following results: ACT Total Score: 15.    Assessment and Plan:   1. Severe persistent asthma without complication   2. Other allergic rhinitis   3. LPRD (laryngopharyngeal reflux disease)     1. Continue Breo as prescribed by Dr. Alcide Clever  2. Continue Omeprazole 40 one tablet two times per day + Pepcid in evening  3. Continue Montelukast (singulair) 10 one tablet one time per day  4. Continue Zyrtec 10 one tablet one time per day  6. Use Duoneb nebulization or Combivent respimat two puffs every 4-6 hours if needed.  7. Continue xolair and epi-pen  8. Return in 6 months or earlier if problem.  From a respiratory standpoint Dub Mikes is stable although certainly stability is defined by the requirement for a short acting bronchodilator multiple times per day while utilizing a large collection of anti-inflammatory medication including Xolair. She will continue to use this form of therapy and as well continue to use therapy directed against reflux as noted above. I will see her back in this clinic in 6 months or earlier if there is a problem.  Allena Katz, MD [place]

## 2016-06-29 NOTE — Patient Instructions (Signed)
  1. Continue Breo as prescribed by Dr. Chodri  2. Continue Omeprazole 40 one tablet two times per day + Pepcid in evening  3. Continue Montelukast (singulair) 10 one tablet one time per day  4. Continue Zyrtec 10 one tablet one time per day  6. Use Duoneb nebulization or Combivent respimat two puffs every 4-6 hours if needed.  7. Continue xolair and epi-pen  8. Return in 6 months or earlier if problem. 

## 2016-07-13 ENCOUNTER — Ambulatory Visit (INDEPENDENT_AMBULATORY_CARE_PROVIDER_SITE_OTHER): Payer: Medicare Other | Admitting: *Deleted

## 2016-07-13 DIAGNOSIS — M79643 Pain in unspecified hand: Secondary | ICD-10-CM | POA: Insufficient documentation

## 2016-07-13 DIAGNOSIS — M1990 Unspecified osteoarthritis, unspecified site: Secondary | ICD-10-CM | POA: Insufficient documentation

## 2016-07-13 DIAGNOSIS — M79673 Pain in unspecified foot: Secondary | ICD-10-CM

## 2016-07-13 DIAGNOSIS — R700 Elevated erythrocyte sedimentation rate: Secondary | ICD-10-CM | POA: Insufficient documentation

## 2016-07-13 DIAGNOSIS — J4540 Moderate persistent asthma, uncomplicated: Secondary | ICD-10-CM | POA: Diagnosis not present

## 2016-07-13 DIAGNOSIS — J4550 Severe persistent asthma, uncomplicated: Secondary | ICD-10-CM

## 2016-07-13 HISTORY — DX: Pain in unspecified hand: M79.643

## 2016-07-13 HISTORY — DX: Elevated erythrocyte sedimentation rate: R70.0

## 2016-07-13 HISTORY — DX: Pain in unspecified foot: M79.673

## 2016-07-13 NOTE — Progress Notes (Signed)
Office Visit Note  Patient: Kimberly Jenkins             Date of Birth: Aug 27, 1966           MRN: [id number]             PCP: Welford Roche, NP Referring: Welford Roche, NP Visit Date: 07/18/2016 Occupation:Diability    Subjective:  Hand pain and swelling   History of Present Illness: Kimberly Jenkins is a 50 y.o. female with history of inflammatory arthritis. She states she finished the prednisone taper and had minimal relief all she was on the prednisone. Her hands continue to swell and are very painful. She has occasional swelling in her ankle joints.   Activities of Daily Living:  Patient reports morning stiffness for 30 minutes.   Patient Reports nocturnal pain.  Difficulty dressing/grooming: Reports Difficulty climbing stairs: Reports Difficulty getting out of chair: Reports Difficulty using hands for taps, buttons, cutlery, and/or writing: Reports   Review of Systems  Constitutional: Positive for weight gain and weakness. Negative for fatigue, night sweats and weight loss.  HENT: Positive for mouth dryness. Negative for mouth sores, trouble swallowing, trouble swallowing and nose dryness.   Eyes: Negative for pain, redness, visual disturbance and dryness.  Respiratory: Negative for cough, shortness of breath and difficulty breathing.   Cardiovascular: Negative for chest pain, palpitations, hypertension, irregular heartbeat and swelling in legs/feet.  Gastrointestinal: Positive for constipation and diarrhea. Negative for blood in stool.  Endocrine: Negative for increased urination.  Genitourinary: Negative for vaginal dryness.  Musculoskeletal: Positive for arthralgias, joint pain, joint swelling, myalgias, morning stiffness and myalgias. Negative for muscle weakness and muscle tenderness.  Skin: Negative for color change, rash, hair loss, skin tightness, ulcers and sensitivity to sunlight.  Allergic/Immunologic: Negative for susceptible to infections.    Neurological: Negative for dizziness, memory loss and night sweats.  Hematological: Negative for swollen glands.  Psychiatric/Behavioral: Positive for depressed mood and sleep disturbance. The patient is nervous/anxious.     PMFS History:  Patient Active Problem List   Diagnosis Date Noted  . Rheumatoid arthritis of multiple sites with negative rheumatoid factor  07/18/2016  . High risk medication use 07/18/2016  . Hand pain 07/13/2016  . Foot pain 07/13/2016  . Elevated sed rate 07/13/2016  . Inflammatory arthritis 07/13/2016  . Severe persistent asthma 04/30/2015  . Other allergic rhinitis 04/30/2015  . GERD (gastroesophageal reflux disease) 04/30/2015    Past Medical History:  Diagnosis Date  . Allergic rhinitis   . Asthma   . Cancer of kidney ([place])   . Dehydration   . GERD (gastroesophageal reflux disease)   . Hypothyroidism   . Kidney disease   . Sleep apnea     Family History  Problem Relation Age of Onset  . Asthma Mother   . Lung cancer Mother   . Asthma Father   . Allergic rhinitis Father   . COPD Father   . Heart attack Father   . Brain cancer Brother   . Brain cancer Paternal Uncle   . Asthma Maternal Grandmother   . Brain cancer Paternal Grandmother   . Heart attack Brother    Past Surgical History:  Procedure Laterality Date  . APPENDECTOMY    . CESAREAN SECTION     2 times  . CHOLECYSTECTOMY    . KIDNEY SURGERY    . SHOULDER SURGERY    . TONSILLECTOMY     Social History   Social History Narrative  .  No narrative on file     Objective: Vital Signs: BP 107/65   Pulse 82   Resp 14   Ht 5\' 2"  (1.575 m)   Wt 223 lb (101.2 kg)   BMI 40.79 kg/m    Physical Exam  Constitutional: She is oriented to person, place, and time. She appears well-developed and well-nourished.  HENT:  Head: Normocephalic and atraumatic.  Eyes: Conjunctivae and EOM are normal.  Neck: Normal range of motion.  Cardiovascular: Normal rate, regular rhythm, normal  heart sounds and intact distal pulses.   Pulmonary/Chest: Effort normal and breath sounds normal.  Abdominal: Soft. Bowel sounds are normal.  Liver and spleen difficult to palpate due to body habitus  Lymphadenopathy:    She has no cervical adenopathy.  Neurological: She is alert and oriented to person, place, and time.  Skin: Skin is warm and dry. Capillary refill takes less than 2 seconds.  Psychiatric: She has a normal mood and affect. Her behavior is normal.  Nursing note and vitals reviewed.    Musculoskeletal Exam: C-spine, thoracic spine, lumbar spine good range of motion. No SI joint tenderness. Shoulder joints, elbow joints, wrist joints with good range of motion she is tenderness and pain and swelling over bilateral wrist joints. She has tenderness and synovitis on palpation over bilateral first second and third MCP joint. She has tenderness across all PIP joints in her hands. She is tenderness on palpation of bilateral ankle joints. Although joints afford range of motion with no synovitis.  CDAI Exam: CDAI Homunculus Exam:   Tenderness:  RUE: wrist LUE: wrist Right hand: 3rd MCP, 4th MCP, 5th MCP, 2nd PIP, 3rd PIP, 4th PIP and 5th PIP Left hand: 1st MCP, 2nd MCP, 3rd MCP, 4th MCP, 5th MCP, 2nd PIP, 3rd PIP, 4th PIP and 5th PIP RLE: tibiotalar LLE: tibiotalar  Swelling:  RUE: wrist LUE: wrist Right hand: 2nd MCP and 3rd MCP Left hand: 1st MCP, 2nd MCP and 3rd MCP  Joint Counts:  CDAI Tender Joint count: 18 CDAI Swollen Joint count: 7  Global Assessments:  Patient Global Assessment: 8 Provider Global Assessment: 8  CDAI Calculated Score: 41    Investigation: Findings:  September 2017:  Uric acid was 6.3.  ANA was negative, rheumatoid factor negative, and sed rate was 48. 06/15/2016 CMP normal, CBC normal, CK normal, hep panel negative, HIV negative, Ace 32, SPEP negative, immunoglobulins normal, UA negative, CCP negative, 14 33 eta negative, TB negative, vitamin  D 47   X-rays of bilateral hands, 2 views 06/15/16, showed osteopenia, reticular pattern, PIP and DIP narrowing, no erosive changes, and no intercarpal joint space changes were noted.   X-rays of bilateral feet, 2 views 06/15/16, showed bilateral 1st MTP, all PIP and DIP narrowing, no erosive changes, and bilateral inferior and posterior calcaneal spurs were noted.   An ultrasound of her bilateral hands was performed 06/15/16 to look for synovitis or erosive changes.  After informed consent was obtained, per EULAR recommendations, ultrasound examination of bilateral hands was performed using a 12 MHz transducer, Gray scale, and power Doppler.  Bilateral 2nd, 3rd, and 5th MCP joints and bilateral wrist joints, both dorsal and volar aspects, were evaluated.  The findings were:  She had synovial thickening in her left 2nd and 3rd MCP joints.  She has synovitis in her left 2nd, 3rd, and 5th MCP joints.  She has synovitis in her bilateral wrist joints. The right median nerve was 0.15 cm square, and the left was 0.13 cm  square.  Both are more than upper limits of normal.  These findings are consistent with inflammatory arthritis.       Imaging: No results found.  Speciality Comments: No specialty comments available.    Procedures:  No procedures performed Allergies: Advair diskus [fluticasone-salmeterol]; Aspirin; Ciprofloxacin; Clonidine derivatives; Nystatin; Oxycodone; Serevent [salmeterol]; and Cephalexin   Assessment / Plan: Visit Diagnoses: Rheumatoid arthritis of multiple sites with negative rheumatoid factor  - -RF,-CCP,-14-3-3eta,HighESR,+ synovitis on ultrasound: She continues to have significant synovitis in her bilateral hands and discomfort. She has come off the prednisone taper and has a lot of discomfort. She does not want to restart prednisone. We had detailed discussion regarding different treatment options and their side effects. Keeping in mind the aggressive nature of her  disease we discussed possible option of methotrexate. Indications side effects contraindications were discussed at length. The medication was reviewed at length by Dr. Koleen Nimrod our pharmacist. Handout was given consent was taken. The plan is to start her on 6 tablets by mouth every week of 2.5 mg methotrexate if tolerated we will increase it to 8 tablets by mouth every week. She will also get folic acid 1 mg, 2 tablets by mouth daily.  High risk medication use: She'll be starting methotrexate and folic acid today. We will check her labs and 2 weeks 2 and then every 2 months to monitor for drug toxicity.  Pain in both hands: She has some pain and stiffness due to underlying osteoarthritis  Pain in both feet : She has some changes in her feet due to osteoarthritis  She is morbidly obese. Weight loss diet and exercise was discussed.  Association of heart disease with rheumatoid arthritis was discussed. Need to monitor blood pressure, cholesterol, and to exercise 30-60 minutes on daily basis was discussed. Poor dental hygiene can be a predisposing factor for rheumatoid arthritis. Good dental hygiene was discussed. Orders: No orders of the defined types were placed in this encounter.  No orders of the defined types were placed in this encounter.   Face-to-face time spent with patient was 40 minutes. 50% of time was spent in counseling and coordination of care.  Follow-Up Instructions: Return in about 3 months (around 10/18/2016) for Rheumatoid arthritis.   Bo Merino, MD

## 2016-07-17 ENCOUNTER — Ambulatory Visit: Payer: Medicare Other | Admitting: Rheumatology

## 2016-07-17 ENCOUNTER — Other Ambulatory Visit: Payer: Self-pay | Admitting: Rheumatology

## 2016-07-18 ENCOUNTER — Encounter: Payer: Self-pay | Admitting: Rheumatology

## 2016-07-18 ENCOUNTER — Ambulatory Visit (INDEPENDENT_AMBULATORY_CARE_PROVIDER_SITE_OTHER): Payer: Medicare Other | Admitting: Rheumatology

## 2016-07-18 VITALS — BP 107/65 | HR 82 | Resp 14 | Ht 62.0 in | Wt 223.0 lb

## 2016-07-18 DIAGNOSIS — Z9189 Other specified personal risk factors, not elsewhere classified: Secondary | ICD-10-CM | POA: Insufficient documentation

## 2016-07-18 DIAGNOSIS — M79672 Pain in left foot: Secondary | ICD-10-CM | POA: Diagnosis not present

## 2016-07-18 DIAGNOSIS — M79641 Pain in right hand: Secondary | ICD-10-CM | POA: Diagnosis not present

## 2016-07-18 DIAGNOSIS — Z79899 Other long term (current) drug therapy: Secondary | ICD-10-CM

## 2016-07-18 DIAGNOSIS — IMO0001 Reserved for inherently not codable concepts without codable children: Secondary | ICD-10-CM

## 2016-07-18 DIAGNOSIS — Z6841 Body Mass Index (BMI) 40.0 and over, adult: Secondary | ICD-10-CM | POA: Diagnosis not present

## 2016-07-18 DIAGNOSIS — M0609 Rheumatoid arthritis without rheumatoid factor, multiple sites: Secondary | ICD-10-CM | POA: Diagnosis not present

## 2016-07-18 DIAGNOSIS — M79671 Pain in right foot: Secondary | ICD-10-CM | POA: Diagnosis not present

## 2016-07-18 DIAGNOSIS — M79642 Pain in left hand: Secondary | ICD-10-CM | POA: Diagnosis not present

## 2016-07-18 DIAGNOSIS — E6609 Other obesity due to excess calories: Secondary | ICD-10-CM | POA: Diagnosis not present

## 2016-07-18 HISTORY — DX: Other specified personal risk factors, not elsewhere classified: Z91.89

## 2016-07-18 HISTORY — DX: Other long term (current) drug therapy: Z79.899

## 2016-07-18 MED ORDER — METHOTREXATE 2.5 MG PO TABS
ORAL_TABLET | ORAL | 0 refills | Status: DC
Start: 2016-07-18 — End: 2016-10-15

## 2016-07-18 MED ORDER — FOLIC ACID 1 MG PO TABS: 2.0000 mg | ORAL_TABLET | Freq: Every day | ORAL | 3 refills | Status: DC

## 2016-07-18 NOTE — Progress Notes (Signed)
Pharmacy Note Subjective: Patient presents today to the [facility] to see Dr. Estanislado Pandy.  Patient seen by the pharmacist for counseling on methotrexate.    Objective: CBC (06/15/16) WBC: 8.3 K/uL RBC: 4.81 MIL/uL Hgb: 13 g/dL Hct: 40.1 % PLT: 329 K/uL  CMP (06/15/16)  SCr: 0.98 mg/dL BUN: 13 mg/dL AST: 17 U/L ALT: 19 U/L  TB Gold: negative  (06/15/16) Hepatitis panel: negative (06/15/16) HIV: negative (06/15/16)  Chest-xray: On file from 06/15/16   Assessment/Plan:  Patient was initiated on methotrexate tablets.  Patient was counseled on the purpose, proper use, and adverse effects of methotrexate including nausea, infection, and signs and symptoms of pneumonitis.  Reviewed instructions with patient to take methotrexate 6 tablets weekly for two weeks then 8 tablets weekly along with folic acid 2mg  daily.  Discussed the importance of frequent monitoring of kidney and liver function and blood counts, and provided patient with standing lab instructions.  Counseled patient to avoid sulfa antibiotics such as Bactrim or Septra while on methotrexate.  Advised patient to get annual influenza vaccine and to get a pneumococcal vaccine if patient has not already had one.  Patient confirms she has already had both.  Provided patient with educational materials on methotrexate and answered all questions.  Patient consented to methotrexate use.  Will upload consent into patient's chart.    [name], Pharm.D., BCPS Clinical Pharmacist Pager: [phone] Phone: [phone] 07/18/2016 1:56 PM

## 2016-07-18 NOTE — Patient Instructions (Signed)
Association of heart disease with rheumatoid arthritis was discussed. Need to monitor blood pressure, cholesterol, and to exercise 30-60 minutes on daily basis was discussed. Poor dental hygiene can be a predisposing factor for rheumatoid arthritis. Good dental hygiene was discussed.  Standing Labs We placed an order today for your standing lab work.    Please come back and get your standing labs in 2 weeks times 2 then every 2 months.   We have open lab Monday through Friday from 8:30-11:30 AM and 1-4 PM at the office of Dr. Tresa Moore, PA.   The office is located at [address], [place], [place], [place] [address] No appointment is necessary.   Labs are drawn by [facility].  You may receive a bill from [facility] for your lab work.    Methotrexate tablets What is this medicine? METHOTREXATE (METH oh TREX ate) is a chemotherapy drug used to treat cancer including breast cancer, leukemia, and lymphoma. This medicine can also be used to treat psoriasis and certain kinds of arthritis. This medicine may be used for other purposes; ask your health care provider or pharmacist if you have questions. COMMON BRAND NAME(S): Rheumatrex, Trexall What should I tell my health care provider before I take this medicine? They need to know if you have any of these conditions: -fluid in the stomach area or lungs -if you often drink alcohol -infection or immune system problems -kidney disease or on hemodialysis -liver disease -low blood counts, like low white cell, platelet, or red cell counts -lung disease -radiation therapy -stomach ulcers -ulcerative colitis -an unusual or allergic reaction to methotrexate, other medicines, foods, dyes, or preservatives -pregnant or trying to get pregnant -breast-feeding How should I use this medicine? Take this medicine by mouth with a glass of water. Follow the directions on the prescription label. Take your medicine at regular intervals. Do not  take it more often than directed. Do not stop taking except on your doctor's advice. Make sure you know why you are taking this medicine and how often you should take it. If this medicine is used for a condition that is not cancer, like arthritis or psoriasis, it should be taken weekly, NOT daily. Taking this medicine more often than directed can cause serious side effects, even death. Talk to your healthcare provider about safe handling and disposal of this medicine. You may need to take special precautions. Talk to your pediatrician regarding the use of this medicine in children. While this drug may be prescribed for selected conditions, precautions do apply. Overdosage: If you think you have taken too much of this medicine contact a poison control center or emergency room at once. NOTE: This medicine is only for you. Do not share this medicine with others. What if I miss a dose? If you miss a dose, talk with your doctor or health care professional. Do not take double or extra doses. What may interact with this medicine? This medicine may interact with the following medication: -acitretin -aspirin and aspirin-like medicines including salicylates -azathioprine -certain antibiotics like penicillins, tetracycline, and chloramphenicol -cyclosporine -gold -hydroxychloroquine -live virus vaccines -NSAIDs, medicines for pain and inflammation, like ibuprofen or naproxen -other cytotoxic agents -penicillamine -phenylbutazone -phenytoin -probenecid -retinoids such as isotretinoin and tretinoin -steroid medicines like prednisone or cortisone -sulfonamides like sulfasalazine and trimethoprim/sulfamethoxazole -theophylline This list may not describe all possible interactions. Give your health care provider a list of all the medicines, herbs, non-prescription drugs, or dietary supplements you use. Also tell them if you smoke,  drink alcohol, or use illegal drugs. Some items may interact with your  medicine. What should I watch for while using this medicine? Avoid alcoholic drinks. This medicine can make you more sensitive to the sun. Keep out of the sun. If you cannot avoid being in the sun, wear protective clothing and use sunscreen. Do not use sun lamps or tanning beds/booths. You may need blood work done while you are taking this medicine. Call your doctor or health care professional for advice if you get a fever, chills or sore throat, or other symptoms of a cold or flu. Do not treat yourself. This drug decreases your body's ability to fight infections. Try to avoid being around people who are sick. This medicine may increase your risk to bruise or bleed. Call your doctor or health care professional if you notice any unusual bleeding. Check with your doctor or health care professional if you get an attack of severe diarrhea, nausea and vomiting, or if you sweat a lot. The loss of too much body fluid can make it dangerous for you to take this medicine. Talk to your doctor about your risk of cancer. You may be more at risk for certain types of cancers if you take this medicine. Both men and women must use effective birth control with this medicine. Do not become pregnant while taking this medicine or until at least 1 normal menstrual cycle has occurred after stopping it. Women should inform their doctor if they wish to become pregnant or think they might be pregnant. Men should not father a child while taking this medicine and for 3 months after stopping it. There is a potential for serious side effects to an unborn child. Talk to your health care professional or pharmacist for more information. Do not breast-feed an infant while taking this medicine. What side effects may I notice from receiving this medicine? Side effects that you should report to your doctor or health care professional as soon as possible: -allergic reactions like skin rash, itching or hives, swelling of the face, lips, or  tongue -breathing problems or shortness of breath -diarrhea -dry, nonproductive cough -low blood counts - this medicine may decrease the number of white blood cells, red blood cells and platelets. You may be at increased risk for infections and bleeding. -mouth sores -redness, blistering, peeling or loosening of the skin, including inside the mouth -signs of infection - fever or chills, cough, sore throat, pain or trouble passing urine -signs and symptoms of bleeding such as bloody or black, tarry stools; red or dark-brown urine; spitting up blood or brown material that looks like coffee grounds; red spots on the skin; unusual bruising or bleeding from the eye, gums, or nose -signs and symptoms of kidney injury like trouble passing urine or change in the amount of urine -signs and symptoms of liver injury like dark yellow or brown urine; general ill feeling or flu-like symptoms; light-colored stools; loss of appetite; nausea; right upper belly pain; unusually weak or tired; yellowing of the eyes or skin Side effects that usually do not require medical attention (report to your doctor or health care professional if they continue or are bothersome): -dizziness -hair loss -tiredness -upset stomach -vomiting This list may not describe all possible side effects. Call your doctor for medical advice about side effects. You may report side effects to FDA at 1-800-FDA-1088. Where should I keep my medicine? Keep out of the reach of children. Store at room temperature between 20 and 25 degrees C (  68 and 77 degrees F). Protect from light. Throw away any unused medicine after the expiration date. NOTE: This sheet is a summary. It may not cover all possible information. If you have questions about this medicine, talk to your doctor, pharmacist, or health care provider.  2017 Elsevier/Gold Standard (2015-04-19 05:39:22)

## 2016-08-03 ENCOUNTER — Telehealth: Payer: Self-pay | Admitting: Rheumatology

## 2016-08-03 ENCOUNTER — Encounter: Payer: Self-pay | Admitting: Rheumatology

## 2016-08-03 NOTE — Telephone Encounter (Signed)
Patient called about MTX and says she has gotten diarrhea the past two time she has taken it. She was also seen by her PCP and was told she is very dehydrated as well. Patient has questions about medication since she is due to take it on Sunday. Please call patient.

## 2016-08-03 NOTE — Telephone Encounter (Signed)
Patient has been advised to stop her Methotrexate. Patient has been advised to make a follow up appointment to discuss other treatment options. Front desk to call patient to schedule appointment.

## 2016-08-03 NOTE — Telephone Encounter (Signed)
Discontinue methotrexate. Reschedule a follow-up visit to discuss other treatment options

## 2016-08-03 NOTE — Telephone Encounter (Signed)
Patient states she is on 6 MTX per week which she started taking 07/23/16. Patient states she has had diarrhea the last two time she has taken the medication. Patient has seen her PCP and states she was informed she is dehydrated and her blood pressure is low.  She has also been seen in the emergency room last Thursday due to a fall. Patient would like to know how to proceed with her medication.

## 2016-08-07 ENCOUNTER — Ambulatory Visit (INDEPENDENT_AMBULATORY_CARE_PROVIDER_SITE_OTHER): Payer: Medicare Other | Admitting: *Deleted

## 2016-08-07 DIAGNOSIS — J4550 Severe persistent asthma, uncomplicated: Secondary | ICD-10-CM

## 2016-08-08 ENCOUNTER — Other Ambulatory Visit: Payer: Self-pay | Admitting: Rheumatology

## 2016-08-08 NOTE — Telephone Encounter (Signed)
Ok to give prednisone taper as prescribed before.

## 2016-08-08 NOTE — Telephone Encounter (Signed)
Spoke to patient and she is having pain and swelling in her hands and wrists. Patient discontinued MTX due to side effects. Patient has a follow up scheduled to discuss other treatment options.   Last Visit: 07/18/16 Next Visit: 08/29/16   Okay to refill Prednisone?

## 2016-08-24 NOTE — Progress Notes (Signed)
Office Visit Note  Patient: Kimberly Jenkins             Date of Birth: 1966/03/10           MRN: [id number]             PCP: Welford Roche, NP Referring: Welford Roche, NP Visit Date: 08/29/2016 Occupation: On disability since 1996           Subjective:  Pain and swelling in hands   History of Present Illness: Kimberly Jenkins is a 50 y.o. female with seronegative rheumatoid arthritis. She states she started methotrexate after the last visit. When she reached 6 tablets per week she started having diarrhea to the point she got dehydrated. She received IV fluids at the hospital. She states she went for follow-up visit with her PCP who did x-rays of her bilateral hands and her lower back and found that she had some disc disease in her lower back. She started having increased pain and swelling in her bilateral hands and was given a Medrol Dosepak which she finished about a week ago she is having  increased swelling again in her bilateral hands. She also complains of discomfort in her bilateral knee joints and her ankles.  Activities of Daily Living:  Patient reports morning stiffness for all day hours.   Patient Reports nocturnal pain.  Difficulty dressing/grooming: Denies Difficulty climbing stairs: Reports Difficulty getting out of chair: Reports Difficulty using hands for taps, buttons, cutlery, and/or writing: Reports   Review of Systems  Constitutional: Positive for fatigue. Negative for night sweats, weight gain, weight loss and weakness.  HENT: Negative for mouth sores, trouble swallowing, trouble swallowing, mouth dryness and nose dryness.   Eyes: Negative for pain, redness, visual disturbance and dryness.  Respiratory: Negative for cough, shortness of breath and difficulty breathing.   Cardiovascular: Negative for chest pain, palpitations, hypertension, irregular heartbeat and swelling in legs/feet.  Gastrointestinal: Positive for constipation and diarrhea. Negative  for blood in stool.  Endocrine: Negative for increased urination.  Genitourinary: Negative for vaginal dryness.  Musculoskeletal: Positive for arthralgias, joint pain, joint swelling, myalgias, morning stiffness and myalgias. Negative for muscle weakness and muscle tenderness.  Skin: Negative for color change, rash, hair loss, skin tightness, ulcers and sensitivity to sunlight.  Allergic/Immunologic: Negative for susceptible to infections.  Neurological: Negative for dizziness, memory loss and night sweats.  Hematological: Negative for swollen glands.  Psychiatric/Behavioral: Positive for depressed mood and sleep disturbance. The patient is nervous/anxious.     PMFS History:  Patient Active Problem List   Diagnosis Date Noted  . Fibromyalgia 08/27/2016  . History of right rotator cuff tear repair 08/27/2016  . Diabetes 08/27/2016  . Chronic obstructive pulmonary disease ([place]) 08/27/2016  . Dyslipidemia 08/27/2016  . History of renal cell carcinoma 08/27/2016  . Anxiety 08/27/2016  . Sleep apnea 08/27/2016  . Rheumatoid arthritis of multiple sites with negative rheumatoid factor  07/18/2016  . High risk medication use 07/18/2016  . Hand pain 07/13/2016  . Foot pain 07/13/2016  . Elevated sed rate 07/13/2016  . Inflammatory arthritis 07/13/2016  . Severe persistent asthma 04/30/2015  . Other allergic rhinitis 04/30/2015  . GERD (gastroesophageal reflux disease) 04/30/2015    Past Medical History:  Diagnosis Date  . Allergic rhinitis   . Asthma   . Cancer of kidney ([place])   . Dehydration   . GERD (gastroesophageal reflux disease)   . Hypothyroidism   . Kidney disease   . Sleep apnea  Family History  Problem Relation Age of Onset  . Asthma Mother   . Lung cancer Mother   . Asthma Father   . Allergic rhinitis Father   . COPD Father   . Heart attack Father   . Brain cancer Brother   . Brain cancer Paternal Uncle   . Asthma Maternal Grandmother   . Brain cancer  Paternal Grandmother   . Heart attack Brother    Past Surgical History:  Procedure Laterality Date  . APPENDECTOMY    . CESAREAN SECTION     2 times  . CHOLECYSTECTOMY    . KIDNEY SURGERY    . SHOULDER SURGERY    . TONSILLECTOMY     Social History   Social History Narrative  . No narrative on file     Objective: Vital Signs: BP 109/61 (BP Location: Left Arm)   Resp 14   Ht 5' 2.5" (1.588 m)   Wt 227 lb (103 kg)   BMI 40.86 kg/m    Physical Exam  Constitutional: She is oriented to person, place, and time. She appears well-developed and well-nourished.  HENT:  Head: Normocephalic and atraumatic.  Eyes: Conjunctivae and EOM are normal.  Neck: Normal range of motion.  Cardiovascular: Normal rate, regular rhythm, normal heart sounds and intact distal pulses.   Pulmonary/Chest: Effort normal and breath sounds normal.  Abdominal: Soft. Bowel sounds are normal.  Lymphadenopathy:    She has no cervical adenopathy.  Neurological: She is alert and oriented to person, place, and time.  Skin: Skin is warm and dry. Capillary refill takes less than 2 seconds.  Psychiatric: Her behavior is normal.  Increased depression  Nursing note and vitals reviewed.    Musculoskeletal Exam: C-spine and thoracic lumbar spine good range of motion she is some discomfort with range of motion. She is painful range of motion of her bilateral wrist joints and elbow joints. She has some synovitis over her bilateral wrist joints all MCPs and PIP joints with incomplete fist formation. She is warmth on palpation over bilateral knee joints bilateral ankle joints and tenderness over bilateral MTP joints.  CDAI Exam: CDAI Homunculus Exam:   Tenderness:  RUE: glenohumeral and wrist LUE: glenohumeral and wrist Right hand: 1st MCP, 2nd MCP, 3rd MCP, 4th MCP, 5th MCP, 2nd PIP, 3rd PIP, 4th PIP and 5th PIP Left hand: 1st MCP, 2nd MCP, 3rd MCP, 4th MCP, 5th MCP, 2nd PIP, 3rd PIP, 4th PIP and 5th PIP RLE:  tibiofemoral and tibiotalar LLE: tibiofemoral and tibiotalar  Swelling:  RUE: wrist LUE: wrist Right hand: 1st MCP, 2nd MCP, 3rd MCP, 4th MCP, 5th MCP, 2nd PIP, 3rd PIP, 4th PIP and 5th PIP Left hand: 1st MCP, 2nd MCP, 3rd MCP, 4th MCP, 5th MCP, 2nd PIP, 3rd PIP, 4th PIP and 5th PIP  Joint Counts:  CDAI Tender Joint count: 24 CDAI Swollen Joint count: 20  Global Assessments:  Patient Global Assessment: 8 Provider Global Assessment: 6  CDAI Calculated Score: 58    Investigation: Findings:   September 2017:  Uric acid was 6.3.  ANA was negative, rheumatoid factor negative, and sed rate was 48.   06/15/2016  X-rays of bilateral hands, 2 views today, showed osteopenia, reticular pattern, PIP and DIP narrowing, no erosive changes, and no intercarpal joint space changes were noted.   X-rays of bilateral feet, 2 views today, showed bilateral 1st MTP, all PIP and DIP narrowing, no erosive changes, and bilateral inferior and posterior calcaneal spurs were noted.  I also obtained an ultrasound of her bilateral hands to look for synovitis or erosive changes.  After informed consent was obtained, per EULAR recommendations, ultrasound examination of bilateral hands was performed using a 12 MHz transducer, Gray scale, and power Doppler.  Bilateral 2nd, 3rd, and 5th MCP joints and bilateral wrist joints, both dorsal and volar aspects, were evaluated.  The findings were:  She had synovial thickening in her left 2nd and 3rd MCP joints.  She has synovitis in her left 2nd, 3rd, and 5th MCP joints.  She has synovitis in her bilateral wrist joints. The right median nerve was 0.15 cm square, and the left was 0.13 cm square.  Both are more than upper limits of normal.  These findings are consistent with inflammatory arthritis.      No visits with results within 2 Month(s) from this visit.  Latest known visit with results is:  Lab on 06/27/2016  Component Date Value Ref Range Status  . PTH 06/28/2016  52  14 - 64 pg/mL Final  . Calcium 06/28/2016 9.0  8.6 - 10.4 mg/dL Final   Comment:   Interpretive Guide:                              Intact PTH               Calcium                              ----------               ------- Normal Parathyroid           Normal                   Normal Hypoparathyroidism           Low or Low Normal        Low Hyperparathyroidism      Primary                 Normal or High           High      Secondary               High                     Normal or Low      Tertiary                High                     High Non-Parathyroid   Hypercalcemia              Low or Low Normal        High     Imaging: No results found.  Speciality Comments: No specialty comments available.    Procedures:  No procedures performed Allergies: Advair diskus [fluticasone-salmeterol]; Aspirin; Ciprofloxacin; Clonidine derivatives; Nystatin; Oxycodone; Serevent [salmeterol]; and Cephalexin   Assessment / Plan:     Visit Diagnoses: Rheumatoid arthritis of multiple sites with negative rheumatoid factor  - RF, CCP negative, 14 33 eta negative-,ANA-,ESR48, positive synovitis on ultrasound. She has significant synovitis on examination today over bilateral wrist joints and bilateral hands and multiple arthralgias. She could not tolerate methotrexate due to diarrhea and had to get IV fluids. She has underlying IBS which  complicates the situation. She had a prednisone taper which helped her to some extent but not the swelling has returned. We had detailed discussion regarding different treatment options and their side effects. Indications side effects contraindications off Arava and other anti-TNF agents were discussed at length. She decided to proceed with Enbrel. We will apply for Enbrel.  High risk medication use - she is off methotrexate due to diarrhea. I will give her prednisone taper as a bridging therapy she will take prednisone 5 mg tablet 2 tablets by mouth daily for 2  weeks, 1-1/2 for 2 weeks, 1 for 2 weeks and then half for 2 weeks. Side effects were discussed.  Pain in both hands: She has severe synovitis involving her hands and wrists joints  Pain in both feet she has tenderness over bilateral feet  Fibromyalgia: She has generalized pain and myalgias.  History of right rotator cuff tear repair  Her other medical problems are as follows:  Diabetes  COPD  Dyslipidemia  History of renal cell carcinoma - Right partial nephrectomy 2008  Anxiety - With depression. She was tearful during the whole conversation today  Sleep apnea - On CPAP, 2 L oxygen   Her other medical conditions include history of chronic kidney disease, tremors, hypothyroidism, allergies, restless leg syndrome, migraines, IBS, appendectomy, cholecystectomy, tonsillectomy. Orders: No orders of the defined types were placed in this encounter.  Meds ordered this encounter  Medications  . predniSONE (DELTASONE) 5 MG tablet    Sig: Take 10 mg QD for two weeks, then take 7.5 mg QD for two weeks, then 5 mg QD for two weeks, then 2.5 mg QD for two weeks.    Dispense:  56 tablet    Refill:  0    Face-to-face time spent with patient was 40 minutes. 50% of time was spent in counseling and coordination of care.  Follow-Up Instructions: Return in about 2 months (around 10/27/2016) for Rheumatoid arthritis.   Bo Merino, MD

## 2016-08-27 DIAGNOSIS — J449 Chronic obstructive pulmonary disease, unspecified: Secondary | ICD-10-CM | POA: Insufficient documentation

## 2016-08-27 DIAGNOSIS — G4737 Central sleep apnea in conditions classified elsewhere: Secondary | ICD-10-CM

## 2016-08-27 DIAGNOSIS — E785 Hyperlipidemia, unspecified: Secondary | ICD-10-CM | POA: Insufficient documentation

## 2016-08-27 DIAGNOSIS — M797 Fibromyalgia: Secondary | ICD-10-CM

## 2016-08-27 DIAGNOSIS — E1140 Type 2 diabetes mellitus with diabetic neuropathy, unspecified: Secondary | ICD-10-CM | POA: Insufficient documentation

## 2016-08-27 DIAGNOSIS — Z85528 Personal history of other malignant neoplasm of kidney: Secondary | ICD-10-CM | POA: Insufficient documentation

## 2016-08-27 DIAGNOSIS — M75121 Complete rotator cuff tear or rupture of right shoulder, not specified as traumatic: Secondary | ICD-10-CM | POA: Insufficient documentation

## 2016-08-27 DIAGNOSIS — F419 Anxiety disorder, unspecified: Secondary | ICD-10-CM | POA: Insufficient documentation

## 2016-08-27 DIAGNOSIS — Z794 Long term (current) use of insulin: Secondary | ICD-10-CM

## 2016-08-27 HISTORY — DX: Personal history of other malignant neoplasm of kidney: Z85.528

## 2016-08-27 HISTORY — DX: Type 2 diabetes mellitus with diabetic neuropathy, unspecified: Z79.4

## 2016-08-27 HISTORY — DX: Hyperlipidemia, unspecified: E78.5

## 2016-08-27 HISTORY — DX: Chronic obstructive pulmonary disease, unspecified: J44.9

## 2016-08-27 HISTORY — DX: Central sleep apnea in conditions classified elsewhere: G47.37

## 2016-08-27 HISTORY — DX: Long term (current) use of insulin: E11.40

## 2016-08-27 HISTORY — DX: Fibromyalgia: M79.7

## 2016-08-27 HISTORY — DX: Anxiety disorder, unspecified: F41.9

## 2016-08-29 ENCOUNTER — Ambulatory Visit (INDEPENDENT_AMBULATORY_CARE_PROVIDER_SITE_OTHER): Payer: Medicare Other | Admitting: Rheumatology

## 2016-08-29 ENCOUNTER — Telehealth: Payer: Self-pay | Admitting: Radiology

## 2016-08-29 ENCOUNTER — Encounter: Payer: Self-pay | Admitting: Rheumatology

## 2016-08-29 VITALS — BP 109/61 | Resp 14 | Ht 62.5 in | Wt 227.0 lb

## 2016-08-29 DIAGNOSIS — E1140 Type 2 diabetes mellitus with diabetic neuropathy, unspecified: Secondary | ICD-10-CM | POA: Diagnosis not present

## 2016-08-29 DIAGNOSIS — Z85528 Personal history of other malignant neoplasm of kidney: Secondary | ICD-10-CM

## 2016-08-29 DIAGNOSIS — M797 Fibromyalgia: Secondary | ICD-10-CM

## 2016-08-29 DIAGNOSIS — Z794 Long term (current) use of insulin: Secondary | ICD-10-CM

## 2016-08-29 DIAGNOSIS — M79671 Pain in right foot: Secondary | ICD-10-CM

## 2016-08-29 DIAGNOSIS — M75121 Complete rotator cuff tear or rupture of right shoulder, not specified as traumatic: Secondary | ICD-10-CM | POA: Diagnosis not present

## 2016-08-29 DIAGNOSIS — M0609 Rheumatoid arthritis without rheumatoid factor, multiple sites: Secondary | ICD-10-CM

## 2016-08-29 DIAGNOSIS — Z79899 Other long term (current) drug therapy: Secondary | ICD-10-CM

## 2016-08-29 DIAGNOSIS — E785 Hyperlipidemia, unspecified: Secondary | ICD-10-CM

## 2016-08-29 DIAGNOSIS — G4737 Central sleep apnea in conditions classified elsewhere: Secondary | ICD-10-CM

## 2016-08-29 DIAGNOSIS — J449 Chronic obstructive pulmonary disease, unspecified: Secondary | ICD-10-CM | POA: Diagnosis not present

## 2016-08-29 DIAGNOSIS — F419 Anxiety disorder, unspecified: Secondary | ICD-10-CM | POA: Diagnosis not present

## 2016-08-29 DIAGNOSIS — M79641 Pain in right hand: Secondary | ICD-10-CM | POA: Diagnosis not present

## 2016-08-29 DIAGNOSIS — M79642 Pain in left hand: Secondary | ICD-10-CM

## 2016-08-29 DIAGNOSIS — M79672 Pain in left foot: Secondary | ICD-10-CM

## 2016-08-29 MED ORDER — PREDNISONE 5 MG PO TABS: ORAL_TABLET | ORAL | 0 refills | Status: DC

## 2016-08-29 MED ORDER — ETANERCEPT 50 MG/ML ~~LOC~~ SOAJ: 50.0000 mg | SUBCUTANEOUS | 0 refills | Status: DC

## 2016-08-29 NOTE — Progress Notes (Signed)
Pharmacy Note Subjective: Patient presents today to the [facility] to see Dr. Estanislado Pandy.  Patient was previously on methotrexate, but experienced diarrhea and dehydration while on the medication.  She has stopped methotrexate at this time.  Patient seen by the pharmacist for discussion of leflunomide or Enbrel.    Objective: TB Test: negative (06/15/16) Hepatitis panel: negative (06/15/16) HIV: negative (06/15/16) CBC normal on 06/15/16  Assessment/Plan:  Had a thorough discussion of the purpose, proper use, and adverse effects of leflunomide and Enbrel.  Patient does not want to initiate leflunomide at this time due to the risk of diarrhea.  She is interested in Enbrel at this time.  Counseled patient that Enbrel is a TNF blocking agent.  Reviewed Enbrel dose of 50 mg once weekly.  Counseled patient on purpose, proper use, and adverse effects of Enbrel.  Reviewed the most common adverse effects including infections, headache, and injection site reactions. Discussed that there is the possibility of an increased risk of malignancy but it is not well understood if this increased risk is due to the medication or the disease state.  Advised patient to get yearly dermatology exams due to risk of skin cancer.  Reviewed the importance of regular labs while on Enbrel therapy.  Counseled patient that Enbrel should be held prior to scheduled surgery.  Counseled patient to avoid live vaccines while on Enbrel.  Advised patient to get annual influenza vaccine and the pneumococcal vaccine as needed.  Patient confirms she has had her annual influenza vaccine and has had a pneumococcal vaccine as well.  Provided patient with medication education material and answered all questions.  Patient consented to Enbrel.  Will upload consent into the media tab.  Will apply for Enbrel through patient's insurance company.  Advised patient to contact the office to schedule the first Enbrel shot once Enbrel is approved  by insurance.  Patient voiced understanding.    Patient was also given a prednisone taper while in the office.  Reviewed purpose, proper use, and adverse effects of prednisone with patient.  I also gave her the taper instructions.  Advised patient to closely monitor her blood sugar while on prednisone and to contact her primary care provider if she has elevated blood sugar.  Patient voiced understanding.    [name], Pharm.D., BCPS Clinical Pharmacist Pager: [phone] Phone: [phone] 08/29/2016 11:43 AM

## 2016-08-29 NOTE — Progress Notes (Addendum)
Submitted a prior authorization for Enbrel through CoverMyMeds.   Authorization was approved.   Spoke with patient to inform her of the results. Talked to her about using [facility] outpatient pharmacy to fill her Enbrel and have it mailed to her home. She agreed to have the RX sent there. She will call us to schedule a nursing visit for her first injection to be administered in the office after she receives conformation of the mailed RX.   Will scan document once we receive it.   Kimberly Jenkins, [place], CPhT

## 2016-08-29 NOTE — Patient Instructions (Addendum)
Prednisone taper using 5 mg tablets:  Take 10 mg (2 tablets) daily for two weeks, then take 7.5 mg (1 and one-half tablets) daily for two weeks, then 5 mg (1 tablet) daily for two weeks, then 2.5 mg (one-half tablet) daily for two weeks.    Monitor your blood sugar closely while on prednisone.  Call your primary care if your blood sugar is elevated.     Etanercept injection What is this medicine? ETANERCEPT (et a [employer]) is used for the treatment of rheumatoid arthritis in adults and children. The medicine is also used to treat psoriatic arthritis, ankylosing spondylitis, and psoriasis. This medicine may be used for other purposes; ask your health care provider or pharmacist if you have questions. COMMON BRAND NAME(S): Enbrel What should I tell my health care provider before I take this medicine? They need to know if you have any of these conditions: -blood disorders -cancer -congestive heart failure -diabetes -exposure to chickenpox -immune system problems -infection -multiple sclerosis -seizure disorder -tuberculosis, a positive skin test for tuberculosis or have recently been in close contact with someone who has tuberculosis -Wegener's granulomatosis -an unusual or allergic reaction to etanercept, latex, other medicines, foods, dyes, or preservatives -pregnant or trying to get pregnant -breast-feeding How should I use this medicine? The medicine is given by injection under the skin. You will be taught how to prepare and give this medicine. Use exactly as directed. Take your medicine at regular intervals. Do not take your medicine more often than directed. It is important that you put your used needles and syringes in a special sharps container. Do not put them in a trash can. If you do not have a sharps container, call your pharmacist or healthcare provider to get one. A special MedGuide will be given to you by the pharmacist with each prescription and refill. Be sure to read this  information carefully each time. Talk to your pediatrician regarding the use of this medicine in children. While this drug may be prescribed for children as young as 8 years of age for selected conditions, precautions do apply. Overdosage: If you think you have taken too much of this medicine contact a poison control center or emergency room at once. NOTE: This medicine is only for you. Do not share this medicine with others. What if I miss a dose? If you miss a dose, contact your health care professional to find out when you should take your next dose. Do not take double or extra doses without advice. What may interact with this medicine? Do not take this medicine with any of the following medications: -anakinra This medicine may also interact with the following medications: -cyclophosphamide -sulfasalazine -vaccines This list may not describe all possible interactions. Give your health care provider a list of all the medicines, herbs, non-prescription drugs, or dietary supplements you use. Also tell them if you smoke, drink alcohol, or use illegal drugs. Some items may interact with your medicine. What should I watch for while using this medicine? Tell your doctor or healthcare professional if your symptoms do not start to get better or if they get worse. You will be tested for tuberculosis (TB) before you start this medicine. If your doctor prescribes any medicine for TB, you should start taking the TB medicine before starting this medicine. Make sure to finish the full course of TB medicine. Call your doctor or health care professional for advice if you get a fever, chills or sore throat, or other symptoms of a  cold or flu. Do not treat yourself. This drug decreases your body's ability to fight infections. Try to avoid being around people who are sick. What side effects may I notice from receiving this medicine? Side effects that you should report to your doctor or health care professional as  soon as possible: -allergic reactions like skin rash, itching or hives, swelling of the face, lips, or tongue -changes in vision -fever, chills or any other sign of infection -numbness or tingling in legs or other parts of the body -red, scaly patches or raised bumps on the skin -shortness of breath or difficulty breathing -swollen lymph nodes in the neck, underarm, or groin areas -unexplained weight loss -unusual bleeding or bruising -unusual swelling or fluid retention in the legs -unusually weak or tired Side effects that usually do not require medical attention (report to your doctor or health care professional if they continue or are bothersome): -dizziness -headache -nausea -redness, itching, or swelling at the injection site -vomiting This list may not describe all possible side effects. Call your doctor for medical advice about side effects. You may report side effects to FDA at 1-800-FDA-1088. Where should I keep my medicine? Keep out of the reach of children. Store between 2 and 8 degrees C (36 and 46 degrees F). Do not freeze or shake. Protect from light. Throw away any unused medicine after the expiration date. You will be instructed on how to store this medicine. NOTE: This sheet is a summary. It may not cover all possible information. If you have questions about this medicine, talk to your doctor, pharmacist, or health care provider.  2017 Elsevier/Gold Standard (2012-02-19 15:33:36)

## 2016-08-29 NOTE — Telephone Encounter (Signed)
-----   Message from [facility], CPhT sent at 08/29/2016  3:41 PM EST ----- PA for enbrel approved

## 2016-08-30 MED FILL — ENBREL 50 MG/ML SURECLICK S: 50 | 28 days supply | Qty: 4 | Fill #0

## 2016-09-06 ENCOUNTER — Ambulatory Visit (INDEPENDENT_AMBULATORY_CARE_PROVIDER_SITE_OTHER): Payer: Medicare Other | Admitting: *Deleted

## 2016-09-06 DIAGNOSIS — J4550 Severe persistent asthma, uncomplicated: Secondary | ICD-10-CM | POA: Diagnosis not present

## 2016-09-07 ENCOUNTER — Ambulatory Visit: Payer: Medicare Other | Admitting: Radiology

## 2016-09-07 VITALS — BP 130/62 | HR 76

## 2016-09-07 DIAGNOSIS — M0609 Rheumatoid arthritis without rheumatoid factor, multiple sites: Secondary | ICD-10-CM

## 2016-09-07 MED ORDER — ETANERCEPT 50 MG/ML ~~LOC~~ SOSY
50.0000 mg | PREFILLED_SYRINGE | Freq: Once | SUBCUTANEOUS | Status: AC
  Administered 2016-09-07: 50 mg via SUBCUTANEOUS

## 2016-09-07 MED ORDER — ETANERCEPT 50 MG/ML ~~LOC~~ SOAJ: 50.0000 mg | SUBCUTANEOUS | 0 refills | Status: DC

## 2016-09-07 NOTE — Progress Notes (Signed)
Patient presented today for Enbrel new start. Advised return to office in one month for labs

## 2016-09-07 NOTE — Patient Instructions (Signed)
Return in one month for labs

## 2016-09-20 ENCOUNTER — Ambulatory Visit (INDEPENDENT_AMBULATORY_CARE_PROVIDER_SITE_OTHER): Payer: Medicare Other | Admitting: *Deleted

## 2016-09-20 DIAGNOSIS — J4550 Severe persistent asthma, uncomplicated: Secondary | ICD-10-CM

## 2016-09-22 MED FILL — ENBREL 50 MG/ML SURECLICK S: 50 | 28 days supply | Qty: 4 | Fill #0

## 2016-09-26 ENCOUNTER — Telehealth: Payer: Self-pay

## 2016-09-26 NOTE — Telephone Encounter (Signed)
Spoke with Kimberly Jenkins who state that she received her Enbrel via mail order from [facility] today 09/26/16. She did not have any questions regarding the mediation.   [name], [place], CPhT

## 2016-09-30 ENCOUNTER — Other Ambulatory Visit: Payer: Self-pay | Admitting: Rheumatology

## 2016-10-04 ENCOUNTER — Ambulatory Visit (INDEPENDENT_AMBULATORY_CARE_PROVIDER_SITE_OTHER): Payer: Medicare Other | Admitting: *Deleted

## 2016-10-04 DIAGNOSIS — J4550 Severe persistent asthma, uncomplicated: Secondary | ICD-10-CM | POA: Diagnosis not present

## 2016-10-15 ENCOUNTER — Other Ambulatory Visit: Payer: Self-pay | Admitting: Rheumatology

## 2016-10-16 NOTE — Telephone Encounter (Signed)
Last Visit: 08/29/16 Next Visit: 11/01/16 Labs: 06/15/16 WNL Patient states she will come to the office tomorrow to update labs.   Okay to refill 30 day supply MTX?

## 2016-10-18 ENCOUNTER — Ambulatory Visit: Payer: Medicare Other | Admitting: Rheumatology

## 2016-10-18 ENCOUNTER — Other Ambulatory Visit: Payer: Self-pay | Admitting: Radiology

## 2016-10-18 ENCOUNTER — Encounter: Payer: Self-pay | Admitting: Radiology

## 2016-10-18 ENCOUNTER — Telehealth: Payer: Self-pay

## 2016-10-18 DIAGNOSIS — Z79899 Other long term (current) drug therapy: Secondary | ICD-10-CM

## 2016-10-18 LAB — COMPLETE METABOLIC PANEL WITH GFR
ALT: 14 U/L (ref 6–29)
AST: 10 U/L (ref 10–35)
Albumin: 3.6 g/dL (ref 3.6–5.1)
Alkaline Phosphatase: 100 U/L (ref 33–130)
BUN: 12 mg/dL (ref 7–25)
CHLORIDE: 106 mmol/L (ref 98–110)
CO2: 24 mmol/L (ref 20–31)
CREATININE: 1.27 mg/dL — AB (ref 0.50–1.05)
Calcium: 8.6 mg/dL (ref 8.6–10.4)
GFR, EST AFRICAN AMERICAN: 56 mL/min — AB (ref >=60)
GFR, EST NON AFRICAN AMERICAN: 49 mL/min — AB (ref >=60)
Glucose, Bld: 142 mg/dL — ABNORMAL HIGH (ref 65–99)
POTASSIUM: 4.1 mmol/L (ref 3.5–5.3)
Sodium: 141 mmol/L (ref 135–146)
Total Bilirubin: 0.3 mg/dL (ref 0.2–1.2)
Total Protein: 6.6 g/dL (ref 6.1–8.1)

## 2016-10-18 LAB — CBC WITH DIFFERENTIAL/PLATELET
Basophils Absolute: 0 cells/uL (ref 0–200)
Basophils Relative: 0 %
EOS ABS: 210 {cells}/uL (ref 15–500)
Eosinophils Relative: 2 %
HEMATOCRIT: 38 % (ref 35.0–45.0)
Hemoglobin: 12 g/dL (ref 11.7–15.5)
LYMPHS PCT: 22 %
Lymphs Abs: 2310 cells/uL (ref 850–3900)
MCH: 27.1 pg (ref 27.0–33.0)
MCHC: 31.6 g/dL — AB (ref 32.0–36.0)
MCV: 86 fL (ref 80.0–100.0)
MONO ABS: 945 {cells}/uL (ref 200–950)
MONOS PCT: 9 %
MPV: 9.7 fL (ref 7.5–12.5)
NEUTROS PCT: 67 %
Neutro Abs: 7035 cells/uL (ref 1500–7800)
Platelets: 280 10*3/uL (ref 140–400)
RBC: 4.42 MIL/uL (ref 3.80–5.10)
RDW: 15.6 % — AB (ref 11.0–15.0)
WBC: 10.5 10*3/uL (ref 3.8–10.8)

## 2016-10-18 MED FILL — ENBREL 50 MG/ML SURECLICK S: 50 | 28 days supply | Qty: 4 | Fill #1

## 2016-10-18 NOTE — Progress Notes (Signed)
Office Visit Note  Patient: Kimberly Jenkins             Date of Birth: [birth year]           MRN: [id number]             PCP: Mikael Spray, NP Referring: Mikael Spray, NP Visit Date: 11/01/2016 Occupation: @GUAROCC @    Subjective:  Pain hands   History of Present Illness: Kimberly Jenkins is a 51 y.o. female with history of seronegative rheumatoid arthritis. She continues to have pain and swelling in her bilateral hands. She was recently given a prednisone taper due to ongoing pain and swelling which she finished last week. She was started on methotrexate initially but it was discontinued due to diarrhea. And was a started on 09/07/2016 which she has not noticed much improvement on the medication. She continues to have pain and swelling in her bilateral hands. None of the other joints are swollen although she's a stiff in multiple joints.  Activities of Daily Living:  Patient reports morning stiffness for 15 minutes.   Patient Reports nocturnal pain.  Difficulty dressing/grooming: Reports Difficulty climbing stairs: Reports Difficulty getting out of chair: Reports Difficulty using hands for taps, buttons, cutlery, and/or writing: Reports   Review of Systems  Constitutional: Positive for fatigue and weight loss. Negative for night sweats and weakness.  HENT: Positive for mouth dryness. Negative for mouth sores, trouble swallowing, trouble swallowing and nose dryness.   Eyes: Negative for pain, redness, visual disturbance and dryness.  Respiratory: Negative for cough, shortness of breath and difficulty breathing.   Cardiovascular: Negative for chest pain, palpitations, hypertension, irregular heartbeat and swelling in legs/feet.  Gastrointestinal: Negative for blood in stool, constipation and diarrhea.  Endocrine: Negative for increased urination.  Genitourinary: Negative for vaginal dryness.  Musculoskeletal: Positive for arthralgias, joint pain, joint swelling,  myalgias, morning stiffness and myalgias. Negative for muscle weakness and muscle tenderness.  Skin: Negative for color change, rash, hair loss, skin tightness, ulcers and sensitivity to sunlight.  Allergic/Immunologic: Negative for susceptible to infections.  Neurological: Negative for dizziness, memory loss and night sweats.  Hematological: Negative for swollen glands.  Psychiatric/Behavioral: Positive for depressed mood and sleep disturbance. The patient is nervous/anxious.        Uses CPAP    PMFS History:  Patient Active Problem List   Diagnosis Date Noted  . Fibromyalgia 08/27/2016  . History of right rotator cuff tear repair 08/27/2016  . Diabetes 08/27/2016  . Chronic obstructive pulmonary disease (HCC) 08/27/2016  . Dyslipidemia 08/27/2016  . History of renal cell carcinoma 08/27/2016  . Anxiety 08/27/2016  . Sleep apnea 08/27/2016  . Rheumatoid arthritis of multiple sites with negative rheumatoid factor  07/18/2016  . High risk medication use 07/18/2016  . Hand pain 07/13/2016  . Foot pain 07/13/2016  . Elevated sed rate 07/13/2016  . Inflammatory arthritis 07/13/2016  . Severe persistent asthma 04/30/2015  . Other allergic rhinitis 04/30/2015  . GERD (gastroesophageal reflux disease) 04/30/2015    Past Medical History:  Diagnosis Date  . Allergic rhinitis   . Asthma   . Cancer of kidney (HCC)   . Dehydration   . GERD (gastroesophageal reflux disease)   . Hypothyroidism   . Kidney disease   . Sleep apnea     Family History  Problem Relation Age of Onset  . Asthma Mother   . Lung cancer Mother   . Asthma Father   . Allergic rhinitis Father   .  COPD Father   . Heart attack Father   . Brain cancer Brother   . Brain cancer Paternal Uncle   . Asthma Maternal Grandmother   . Brain cancer Paternal Grandmother   . Heart attack Brother    Past Surgical History:  Procedure Laterality Date  . APPENDECTOMY    . CESAREAN SECTION     2 times  . CHOLECYSTECTOMY     . KIDNEY SURGERY    . SHOULDER SURGERY    . TONSILLECTOMY     Social History   Social History Narrative  . No narrative on file     Objective: Vital Signs: BP 100/60   Pulse 78    Physical Exam  Constitutional: She is oriented to person, place, and time. She appears well-developed and well-nourished.  HENT:  Head: Normocephalic and atraumatic.  Eyes: Conjunctivae and EOM are normal.  Neck: Normal range of motion.  Cardiovascular: Normal rate, regular rhythm, normal heart sounds and intact distal pulses.   Pulmonary/Chest: Effort normal and breath sounds normal.  Abdominal: Soft. Bowel sounds are normal.  Lymphadenopathy:    She has no cervical adenopathy.  Neurological: She is alert and oriented to person, place, and time.  Skin: Skin is warm and dry. Capillary refill takes less than 2 seconds.  Psychiatric: She has a normal mood and affect. Her behavior is normal.  Nursing note and vitals reviewed.    Musculoskeletal Exam: C-spine and thoracic lumbar spine good range of motion. Shoulder joints are good range of motion. Elbow joints with range of motion. She has limited range of motion of bilateral wrist joints synovitis. She synovitis over bilateral MCPs and PIP joints as described above. Discomfort range of motion of her knee joints ankles joints. No MTP PIP normal.  CDAI Exam: CDAI Homunculus Exam:   Tenderness:  RUE: wrist LUE: wrist Right hand: 1st MCP, 2nd MCP, 3rd MCP, 4th MCP, 5th MCP, 3rd PIP and 4th PIP Left hand: 1st MCP, 2nd MCP, 3rd MCP, 4th MCP, 5th MCP, 2nd PIP, 3rd PIP, 4th PIP and 5th PIP RLE: tibiofemoral LLE: tibiofemoral  Swelling:  RUE: wrist LUE: wrist Right hand: 1st MCP, 2nd MCP, 3rd PIP and 4th PIP Left hand: 1st MCP, 2nd MCP, 3rd MCP, 2nd PIP and 4th PIP  Joint Counts:  CDAI Tender Joint count: 20 CDAI Swollen Joint count: 11  Global Assessments:  Patient Global Assessment: 10 Provider Global Assessment: 8  CDAI Calculated Score:  49   Investigation: No additional findings. Orders Only on 10/18/2016  Component Date Value Ref Range Status  . WBC 10/18/2016 10.5  3.8 - 10.8 K/uL Final  . RBC 10/18/2016 4.42  3.80 - 5.10 MIL/uL Final  . Hemoglobin 10/18/2016 12.0  11.7 - 15.5 g/dL Final  . HCT 10/18/2016 38.0  35.0 - 45.0 % Final  . MCV 10/18/2016 86.0  80.0 - 100.0 fL Final  . MCH 10/18/2016 27.1  27.0 - 33.0 pg Final  . MCHC 10/18/2016 31.6* 32.0 - 36.0 g/dL Final  . RDW 10/18/2016 15.6* 11.0 - 15.0 % Final  . Platelets 10/18/2016 280  140 - 400 K/uL Final  . MPV 10/18/2016 9.7  7.5 - 12.5 fL Final  . Neutro Abs 10/18/2016 7035  1,500 - 7,800 cells/uL Final  . Lymphs Abs 10/18/2016 2310  850 - 3,900 cells/uL Final  . Monocytes Absolute 10/18/2016 945  200 - 950 cells/uL Final  . Eosinophils Absolute 10/18/2016 210  15 - 500 cells/uL Final  . Basophils Absolute 10/18/2016  0  0 - 200 cells/uL Final  . Neutrophils Relative % 10/18/2016 67  % Final  . Lymphocytes Relative 10/18/2016 22  % Final  . Monocytes Relative 10/18/2016 9  % Final  . Eosinophils Relative 10/18/2016 2  % Final  . Basophils Relative 10/18/2016 0  % Final  . Smear Review 10/18/2016 Criteria for review not met   Final  . Sodium 10/18/2016 141  135 - 146 mmol/L Final  . Potassium 10/18/2016 4.1  3.5 - 5.3 mmol/L Final  . Chloride 10/18/2016 106  98 - 110 mmol/L Final  . CO2 10/18/2016 24  20 - 31 mmol/L Final  . Glucose, Bld 10/18/2016 142* 65 - 99 mg/dL Final  . BUN 10/18/2016 12  7 - 25 mg/dL Final  . Creat 10/18/2016 1.27* 0.50 - 1.05 mg/dL Final   Comment:   For patients > or = 53 years of age: The upper reference limit for Creatinine is approximately 13% higher for people identified as African-American.     . Total Bilirubin 10/18/2016 0.3  0.2 - 1.2 mg/dL Final  . Alkaline Phosphatase 10/18/2016 100  33 - 130 U/L Final  . AST 10/18/2016 10  10 - 35 U/L Final  . ALT 10/18/2016 14  6 - 29 U/L Final  . Total Protein 10/18/2016 6.6   6.1 - 8.1 g/dL Final  . Albumin 10/18/2016 3.6  3.6 - 5.1 g/dL Final  . Calcium 10/18/2016 8.6  8.6 - 10.4 mg/dL Final  . GFR, Est African American 10/18/2016 56* >=60 mL/min Final  . GFR, Est Non African American 10/18/2016 49* >=60 mL/min Final    Imaging: No results found.  Speciality Comments: No specialty comments available.    Procedures:  No procedures performed Allergies: Methotrexate; Advair diskus [fluticasone-salmeterol]; Aspirin; Ciprofloxacin; Clonidine derivatives; Nystatin; Oxycodone; Serevent [salmeterol]; and Cephalexin   Assessment / Plan:     Visit Diagnoses: Rheumatoid arthritis of multiple sites with negative rheumatoid factor . She's having severe disease with synovitis involving her MCPs and PIPs of her hands. She recently came off prednisone taper and flaring again. She's been on Enbrel for 2 months now and had no response. We had detailed discussion regarding different treatment options. She had methotrexate in the past which cause diarrhea. We discussed the option of Arava but she is concerned about the side effect of diarrhea with Arava. Her creatinine is also elevated we'll continue to monitor that. We discussed switching from Enbrel to Humira indications CONTRAINDICATIONS were discussed at length. Consent was taken we will apply for Humira and the like to switch her as soon as possible. I will also give her the prednisone taper as a bridging therapy. I discussed possible use of sulfasalazine in future. I'll check G6PD with her next lab work as well.  Elevated sed rate  Pain in both feet  Pain in both hands  High risk medication use: Enbrel 50 mg by mouth every week started January 2017 without any good response.  History of renal cell carcinoma dxd 2008 in remission  History of COPD  History of sleep apnea  History of asthma  History of diabetes mellitus: She's been advised to monitor blood sugars closely as she'll be starting another prednisone  taper.  History of hyperlipidemia    Orders: No orders of the defined types were placed in this encounter.  Meds ordered this encounter  Medications  . predniSONE (DELTASONE) 5 MG tablet    Sig: Take 10 mg QD for two weeks, then take  7.5 mg QD for two weeks, then 5 mg QD for two weeks, then 2.5 mg QD for two weeks.    Dispense:  56 tablet    Refill:  0    Face-to-face time spent with patient was 30 minutes. 50% of time was spent in counseling and coordination of care.  Follow-Up Instructions: Return in about 2 months (around 01/01/2017) for Rheumatoid arthritis.   Bo Merino, MD  Note - This record has been created using Editor, commissioning.  Chart creation errors have been sought, but may not always  have been located. Such creation errors do not reflect on  the standard of medical care.

## 2016-10-18 NOTE — Telephone Encounter (Signed)
Noted patient's last refill of Enbrel was on 09/22/16 at [facility]. I called patient with a refill reminder call.  Patient confirms she does need a refill at this time.  Will have the outpatient pharmacy process patient's prescription at this time to be mailed to her home.   Chriselda Leppert, [place], CPhT 12:57 PM

## 2016-10-19 NOTE — Progress Notes (Signed)
Please decrease MTX to 4 tabs po qd. Please, check BMP in 1 month.

## 2016-10-20 ENCOUNTER — Telehealth: Payer: Self-pay | Admitting: Radiology

## 2016-10-20 DIAGNOSIS — Z79899 Other long term (current) drug therapy: Secondary | ICD-10-CM

## 2016-10-20 NOTE — Telephone Encounter (Signed)
Called patient to advise to decrease MTX to 4/ week  Recheck lab in 4 weeks

## 2016-10-20 NOTE — Telephone Encounter (Signed)
-----   Message from Bo Merino, MD sent at 10/19/2016 12:18 PM EST ----- Please decrease MTX to 4 tabs po qd. Please, check BMP in 1 month.

## 2016-10-24 ENCOUNTER — Other Ambulatory Visit: Payer: Self-pay | Admitting: Rheumatology

## 2016-10-25 ENCOUNTER — Telehealth: Payer: Self-pay | Admitting: *Deleted

## 2016-10-25 NOTE — Telephone Encounter (Signed)
Patient states she has not been on MTX for a few months due to getting severely dehydrated. Patient states she ended up in the hospital due and was given IV fluids because the MTX caused diarrhea. Patient wanted to make [place] aware she was no longer on the MTX. Patient states she is only on Enbrel.

## 2016-11-01 ENCOUNTER — Encounter: Payer: Self-pay | Admitting: Rheumatology

## 2016-11-01 ENCOUNTER — Ambulatory Visit (INDEPENDENT_AMBULATORY_CARE_PROVIDER_SITE_OTHER): Payer: Medicare Other | Admitting: Rheumatology

## 2016-11-01 ENCOUNTER — Other Ambulatory Visit: Payer: Self-pay | Admitting: Pharmacist

## 2016-11-01 VITALS — BP 100/60 | HR 78

## 2016-11-01 DIAGNOSIS — M79642 Pain in left hand: Secondary | ICD-10-CM | POA: Diagnosis not present

## 2016-11-01 DIAGNOSIS — M797 Fibromyalgia: Secondary | ICD-10-CM

## 2016-11-01 DIAGNOSIS — Z85528 Personal history of other malignant neoplasm of kidney: Secondary | ICD-10-CM

## 2016-11-01 DIAGNOSIS — Z8669 Personal history of other diseases of the nervous system and sense organs: Secondary | ICD-10-CM | POA: Diagnosis not present

## 2016-11-01 DIAGNOSIS — M0609 Rheumatoid arthritis without rheumatoid factor, multiple sites: Secondary | ICD-10-CM | POA: Diagnosis not present

## 2016-11-01 DIAGNOSIS — Z79899 Other long term (current) drug therapy: Secondary | ICD-10-CM

## 2016-11-01 DIAGNOSIS — M79672 Pain in left foot: Secondary | ICD-10-CM | POA: Diagnosis not present

## 2016-11-01 DIAGNOSIS — M79641 Pain in right hand: Secondary | ICD-10-CM | POA: Diagnosis not present

## 2016-11-01 DIAGNOSIS — Z8709 Personal history of other diseases of the respiratory system: Secondary | ICD-10-CM

## 2016-11-01 DIAGNOSIS — Z8639 Personal history of other endocrine, nutritional and metabolic disease: Secondary | ICD-10-CM

## 2016-11-01 DIAGNOSIS — M79671 Pain in right foot: Secondary | ICD-10-CM | POA: Diagnosis not present

## 2016-11-01 MED ORDER — PREDNISONE 5 MG PO TABS: ORAL_TABLET | ORAL | 0 refills | Status: DC

## 2016-11-01 MED ORDER — ADALIMUMAB 40 MG/0.8ML ~~LOC~~ AJKT: 40.0000 mg | AUTO-INJECTOR | SUBCUTANEOUS | 2 refills | Status: DC

## 2016-11-01 MED FILL — HUMIRA PEN 40 MG/0.8ML PNKT: 40 | 28 days supply | Qty: 2 | Fill #0

## 2016-11-01 NOTE — Progress Notes (Signed)
Submitted a prior authorization for Humira and received an approval through Covermymeds. Patient has been approved through 08/27/17  Reference Number: [id number] Phone Number : [phone]

## 2016-11-01 NOTE — Patient Instructions (Signed)
Adalimumab Injection What is this medicine? ADALIMUMAB (a dal AYE mu mab) is used to treat rheumatoid and psoriatic arthritis. It is also used to treat ankylosing spondylitis, Crohn's disease, ulcerative colitis, plaque psoriasis, hidradenitis suppurativa, and uveitis. This medicine may be used for other purposes; ask your health care provider or pharmacist if you have questions. COMMON BRAND NAME(S): CYLTEZO, Humira What should I tell my health care provider before I take this medicine? They need to know if you have any of these conditions: -diabetes -heart disease -hepatitis B or history of hepatitis B infection -immune system problems -infection or history of infections -multiple sclerosis -recently received or scheduled to receive a vaccine -scheduled to have surgery -tuberculosis, a positive skin test for tuberculosis or have recently been in close contact with someone who has tuberculosis -an unusual reaction to adalimumab, other medicines, mannitol, latex, rubber, foods, dyes, or preservatives -pregnant or trying to get pregnant -breast-feeding How should I use this medicine? This medicine is for injection under the skin. You will be taught how to prepare and give this medicine. Use exactly as directed. Take your medicine at regular intervals. Do not take your medicine more often than directed. A special MedGuide will be given to you by the pharmacist with each prescription and refill. Be sure to read this information carefully each time. It is important that you put your used needles and syringes in a special sharps container. Do not put them in a trash can. If you do not have a sharps container, call your pharmacist or healthcare provider to get one. Talk to your pediatrician regarding the use of this medicine in children. While this drug may be prescribed for children as young as 2 years for selected conditions, precautions do apply. The manufacturer of the medicine offers free  information to patients and their health care partners. Call 1-800-448-6472 for more information. Overdosage: If you think you have taken too much of this medicine contact a poison control center or emergency room at once. NOTE: This medicine is only for you. Do not share this medicine with others. What if I miss a dose? If you miss a dose, take it as soon as you can. If it is almost time for your next dose, take only that dose. Do not take double or extra doses. Give the next dose when your next scheduled dose is due. Call your doctor or health care professional if you are not sure how to handle a missed dose. What may interact with this medicine? Do not take this medicine with any of the following medications: -abatacept -anakinra -etanercept -infliximab -live virus vaccines -rilonacept This medicine may also interact with the following medications: -vaccines This list may not describe all possible interactions. Give your health care provider a list of all the medicines, herbs, non-prescription drugs, or dietary supplements you use. Also tell them if you smoke, drink alcohol, or use illegal drugs. Some items may interact with your medicine. What should I watch for while using this medicine? Visit your doctor or health care professional for regular checks on your progress. Tell your doctor or healthcare professional if your symptoms do not start to get better or if they get worse. You will be tested for tuberculosis (TB) before you start this medicine. If your doctor prescribes any medicine for TB, you should start taking the TB medicine before starting this medicine. Make sure to finish the full course of TB medicine. Call your doctor or health care professional if you get a cold   or other infection while receiving this medicine. Do not treat yourself. This medicine may decrease your body's ability to fight infection. Talk to your doctor about your risk of cancer. You may be more at risk for  certain types of cancers if you take this medicine. What side effects may I notice from receiving this medicine? Side effects that you should report to your doctor or health care professional as soon as possible: -allergic reactions like skin rash, itching or hives, swelling of the face, lips, or tongue -breathing problems -changes in vision -chest pain -fever, chills, or any other sign of infection -numbness or tingling -red, scaly patches or raised bumps on the skin -swelling of the ankles -swollen lymph nodes in the neck, underarm, or groin areas -unexplained weight loss -unusual bleeding or bruising -unusually weak or tired Side effects that usually do not require medical attention (report to your doctor or health care professional if they continue or are bothersome): -headache -nausea -redness, itching, swelling, or bruising at site where injected This list may not describe all possible side effects. Call your doctor for medical advice about side effects. You may report side effects to FDA at 1-800-FDA-1088. Where should I keep my medicine? Keep out of the reach of children. Store in the original container and in the refrigerator between 2 and 8 degrees C (36 and 46 degrees F). Do not freeze. The product may be stored in a cool carrier with an ice pack, if needed. Protect from light. Throw away any unused medicine after the expiration date. NOTE: This sheet is a summary. It may not cover all possible information. If you have questions about this medicine, talk to your doctor, pharmacist, or health care provider.  2018 Elsevier/Gold Standard (2015-03-03 11:11:43)  

## 2016-11-01 NOTE — Progress Notes (Signed)
Pharmacy Note Subjective: Patient presents today to the [facility] to see Dr. Estanislado Pandy.   Kimberly Jenkins is a pleasant 51 yo F who presents for follow up of her rheumatoid arthritis.  She is currently taking Enbrel 50 mg weekly.    Does the patient feel that his/her medications are working for him/her?  No, patient started Enbrel on 09/07/2016.  She reported some improvement in her joints, but is having some joint pain and swelling in her left hand.  Patient was on a prednisone taper and completed it one to two weeks ago.   Has the patient been experiencing any side effects to the medications prescribed?  No Does the patient have any problems obtaining medications?  No   Patient is being switched from Enbrel to Humira.  Patient seen by the pharmacist for counseling on Humira.    Objective: TB Test: negative (06/15/16) Hepatitis panel: negative (06/15/16) HIV: negative (06/15/16)  CBC    Component Value Date/Time   WBC 10.5 10/18/2016 1029   RBC 4.42 10/18/2016 1029   HGB 12.0 10/18/2016 1029   HCT 38.0 10/18/2016 1029   PLT 280 10/18/2016 1029   MCV 86.0 10/18/2016 1029   MCH 27.1 10/18/2016 1029   MCHC 31.6 (L) 10/18/2016 1029   RDW 15.6 (H) 10/18/2016 1029   LYMPHSABS 2,310 10/18/2016 1029   MONOABS 945 10/18/2016 1029   EOSABS 210 10/18/2016 1029   BASOSABS 0 10/18/2016 1029    Assessment/Plan:  Counseled patient that Humira is a TNF blocking agent.  Reviewed Humira dose of 40 mg every other week.  Counseled patient on purpose, proper use, and adverse effects of Humira.  Reviewed the Jenkins common adverse effects including infections, headache, and injection site reactions. Discussed that there is the possibility of an increased risk of malignancy but it is not well understood if this increased risk is due to the medication or the disease state.  Advised patient to get yearly dermatology exams due to risk of skin cancer.  Reviewed the importance of regular labs while on  Humira therapy.  Advised patient to get standing labs one month after starting Humira then every 2-3 months.  Counseled patient that Humira should be held prior to scheduled surgery.  Counseled patient to avoid live vaccines while on Humira.  Advised patient to get annual influenza vaccine and the pneumococcal vaccine as needed.  Provided patient with medication education material and answered all questions.  Patient voiced understanding.  Patient consented to Humira.  Will upload consent into the media tab.  Reviewed storage instructions of Humira.  Will apply for Humira.  Advised patient to contact the office to schedule the first Humira shot once Humira is approved by insurance.  Patient voiced understanding.  Patient reports she takes her Enbrel on Thursdays, and her Jenkins recent Enbrel injection was on 10/26/16.  Patient is aware that initial Humira injection must be at least one week after her Jenkins recent Enbrel injection.   Kimberly Jenkins, Pharm.D., BCPS Clinical Pharmacist Pager: [phone] Phone: [phone] 11/01/2016 11:09 AM

## 2016-11-01 NOTE — Progress Notes (Signed)
Informed patient Humira was approved. Will send Humira prescription to patient's pharmacy.  Nursing visit for initial injection scheduled for 11/02/16 at 10:00 AM.    [name], Pharm.D., BCPS, CPP Clinical Pharmacist Pager: [phone] Phone: [phone] 11/01/2016 12:52 PM

## 2016-11-02 ENCOUNTER — Ambulatory Visit: Payer: Medicare Other

## 2016-11-02 VITALS — BP 105/54 | HR 74

## 2016-11-02 DIAGNOSIS — M0609 Rheumatoid arthritis without rheumatoid factor, multiple sites: Secondary | ICD-10-CM

## 2016-11-02 MED ORDER — ADALIMUMAB 40 MG/0.8ML ~~LOC~~ PSKT
40.0000 mg | PREFILLED_SYRINGE | Freq: Once | SUBCUTANEOUS | Status: AC
  Administered 2016-11-02: 40 mg via SUBCUTANEOUS

## 2016-11-02 NOTE — Progress Notes (Unsigned)
Patient in office for an initial start for Humira. Patient's last Enbrel injection was 1 week ago.  Patient tolerated injection well. Patient was monitored for adverse reactions. No adverse reactions noted.   Administrations This Visit    Adalimumab PSKT 40 mg    Admin Date 11/02/2016 Action Given Dose 40 mg Route Subcutaneous Administered By Carole Binning, LPN

## 2016-11-02 NOTE — Patient Instructions (Signed)
Standing Labs We placed an order today for your standing lab work.    Please come back and get your standing labs in 1 month  We have open lab Monday through Friday from 8:30-11:30 AM and 1:30-4 PM at the office of Dr. Tresa Moore, PA.   The office is located at [address] [place], [place], [place] [address] No appointment is necessary.   Labs are drawn by [facility].  You may receive a bill from [facility] for your lab work.    Adalimumab Injection What is this medicine? ADALIMUMAB (a dal AYE mu mab) is used to treat rheumatoid and psoriatic arthritis. It is also used to treat ankylosing spondylitis, Crohn's disease, ulcerative colitis, plaque psoriasis, hidradenitis suppurativa, and uveitis. This medicine may be used for other purposes; ask your health care provider or pharmacist if you have questions. COMMON BRAND NAME(S): CYLTEZO, Humira What should I tell my health care provider before I take this medicine? They need to know if you have any of these conditions: -diabetes -heart disease -hepatitis B or history of hepatitis B infection -immune system problems -infection or history of infections -multiple sclerosis -recently received or scheduled to receive a vaccine -scheduled to have surgery -tuberculosis, a positive skin test for tuberculosis or have recently been in close contact with someone who has tuberculosis -an unusual reaction to adalimumab, other medicines, mannitol, latex, rubber, foods, dyes, or preservatives -pregnant or trying to get pregnant -breast-feeding How should I use this medicine? This medicine is for injection under the skin. You will be taught how to prepare and give this medicine. Use exactly as directed. Take your medicine at regular intervals. Do not take your medicine more often than directed. A special MedGuide will be given to you by the pharmacist with each prescription and refill. Be sure to read this information carefully each  time. It is important that you put your used needles and syringes in a special sharps container. Do not put them in a trash can. If you do not have a sharps container, call your pharmacist or healthcare provider to get one. Talk to your pediatrician regarding the use of this medicine in children. While this drug may be prescribed for children as young as 2 years for selected conditions, precautions do apply. The manufacturer of the medicine offers free information to patients and their health care partners. Call (364)318-2031 for more information. Overdosage: If you think you have taken too much of this medicine contact a poison control center or emergency room at once. NOTE: This medicine is only for you. Do not share this medicine with others. What if I miss a dose? If you miss a dose, take it as soon as you can. If it is almost time for your next dose, take only that dose. Do not take double or extra doses. Give the next dose when your next scheduled dose is due. Call your doctor or health care professional if you are not sure how to handle a missed dose. What may interact with this medicine? Do not take this medicine with any of the following medications: -abatacept -anakinra -etanercept -infliximab -live virus vaccines -rilonacept This medicine may also interact with the following medications: -vaccines This list may not describe all possible interactions. Give your health care provider a list of all the medicines, herbs, non-prescription drugs, or dietary supplements you use. Also tell them if you smoke, drink alcohol, or use illegal drugs. Some items may interact with your medicine. What should I watch for  while using this medicine? Visit your doctor or health care professional for regular checks on your progress. Tell your doctor or healthcare professional if your symptoms do not start to get better or if they get worse. You will be tested for tuberculosis (TB) before you start this  medicine. If your doctor prescribes any medicine for TB, you should start taking the TB medicine before starting this medicine. Make sure to finish the full course of TB medicine. Call your doctor or health care professional if you get a cold or other infection while receiving this medicine. Do not treat yourself. This medicine may decrease your body's ability to fight infection. Talk to your doctor about your risk of cancer. You may be more at risk for certain types of cancers if you take this medicine. What side effects may I notice from receiving this medicine? Side effects that you should report to your doctor or health care professional as soon as possible: -allergic reactions like skin rash, itching or hives, swelling of the face, lips, or tongue -breathing problems -changes in vision -chest pain -fever, chills, or any other sign of infection -numbness or tingling -red, scaly patches or raised bumps on the skin -swelling of the ankles -swollen lymph nodes in the neck, underarm, or groin areas -unexplained weight loss -unusual bleeding or bruising -unusually weak or tired Side effects that usually do not require medical attention (report to your doctor or health care professional if they continue or are bothersome): -headache -nausea -redness, itching, swelling, or bruising at site where injected This list may not describe all possible side effects. Call your doctor for medical advice about side effects. You may report side effects to FDA at 1-800-FDA-1088. Where should I keep my medicine? Keep out of the reach of children. Store in the original container and in the refrigerator between 2 and 8 degrees C (36 and 46 degrees F). Do not freeze. The product may be stored in a cool carrier with an ice pack, if needed. Protect from light. Throw away any unused medicine after the expiration date. NOTE: This sheet is a summary. It may not cover all possible information. If you have questions  about this medicine, talk to your doctor, pharmacist, or health care provider.  2018 Elsevier/Gold Standard (2015-03-03 11:11:43)

## 2016-11-02 NOTE — Progress Notes (Unsigned)
Pharmacy Note  Subjective:   Patient is being initiated on Humira.  Patient was previously counseled extensively on Humira on 11/01/16 and consented to initiation of Humira at that time.  Patient presents to clinic today to receive the first dose of Humira.  She confirms her most recent Enbrel injection was one week ago.       Objective: CMP     Component Value Date/Time   NA 141 10/18/2016 1029   K 4.1 10/18/2016 1029   CL 106 10/18/2016 1029   CO2 24 10/18/2016 1029   GLUCOSE 142 (H) 10/18/2016 1029   BUN 12 10/18/2016 1029   CREATININE 1.27 (H) 10/18/2016 1029   CALCIUM 8.6 10/18/2016 1029   PROT 6.6 10/18/2016 1029   ALBUMIN 3.6 10/18/2016 1029   AST 10 10/18/2016 1029   ALT 14 10/18/2016 1029   ALKPHOS 100 10/18/2016 1029   BILITOT 0.3 10/18/2016 1029   GFRNONAA 49 (L) 10/18/2016 1029   GFRAA 56 (L) 10/18/2016 1029   CBC    Component Value Date/Time   WBC 10.5 10/18/2016 1029   RBC 4.42 10/18/2016 1029   HGB 12.0 10/18/2016 1029   HCT 38.0 10/18/2016 1029   PLT 280 10/18/2016 1029   MCV 86.0 10/18/2016 1029   MCH 27.1 10/18/2016 1029   MCHC 31.6 (L) 10/18/2016 1029   RDW 15.6 (H) 10/18/2016 1029   LYMPHSABS 2,310 10/18/2016 1029   MONOABS 945 10/18/2016 1029   EOSABS 210 10/18/2016 1029   BASOSABS 0 10/18/2016 1029    TB Gold: negative (06/15/2016)  Assessment/Plan:  Patient was initiated on Humira today.  She will need standing labs in one month.  Patient was monitored for 30 minutes post injection.  Provided patient with standing lab instructions.    [name], Pharm.D., BCPS Clinical Pharmacist Pager: [phone] Phone: [phone] 11/02/2016 10:24 AM

## 2016-11-14 ENCOUNTER — Other Ambulatory Visit: Payer: Self-pay | Admitting: Rheumatology

## 2016-11-14 NOTE — Telephone Encounter (Signed)
Last Visit: 11/01/16 Next Visit: 01/02/17 Labs: 10/18/16 Creat. 1.27 GFR 49 Previous Creat 0.98 GFR 67  Okay to refill MTX?

## 2016-11-14 NOTE — Telephone Encounter (Signed)
Okay to refill methotrexate. We should repeat her BMP in 1 month it could be related to her diuretic. Please fax the results to her PCP.

## 2016-11-15 NOTE — Telephone Encounter (Signed)
Patient states she is not taking this medication due to the side effect of severe diarrhea she had. She has not been taking it.

## 2016-11-15 NOTE — Telephone Encounter (Signed)
If she wants we can offer appt to discuss other tt options

## 2016-11-15 NOTE — Telephone Encounter (Signed)
Spoke with patient and she is agreeable for an appointment to discuss other treatment options. Patient transferred to the front to schedule appointment.

## 2016-11-28 ENCOUNTER — Telehealth: Payer: Self-pay

## 2016-11-28 MED FILL — HUMIRA PEN 40 MG/0.8ML PNKT: 40 | 28 days supply | Qty: 2 | Fill #1

## 2016-11-28 NOTE — Telephone Encounter (Signed)
Noted patient's last refill of Humira was on 11/01/16 at [facility]. I called patient with a refill reminder call.  Patient confirms she does need a refill at this time.  Will have the outpatient pharmacy process patient's prescription at this time.    Cylan Borum, [place], CPhT 9:47 AM

## 2016-12-04 NOTE — Progress Notes (Signed)
Office Visit Note  Patient: Kimberly Jenkins             Date of Birth: 1965-11-08           MRN: [id number]             PCP: Welford Roche, NP Referring: Welford Roche, NP Visit Date: 12/13/2016 Occupation: @GUAROCC @    Subjective:  Pain in hands   History of Present Illness: Kimberly Jenkins is a 51 y.o. female with history of him seronegative rheumatoid arthritis. She's been on Humira since March 2018. She is on monotherapy with that. She has not noticed any improvement in her symptoms she continues to have pain and swelling in multiple joints including her hands, knee joints she also has some popping sensation in her feet. She complains of generalized pain.   Activities of Daily Living:  Patient reports morning stiffness for 45 minutes.   Patient Reports nocturnal pain.  Difficulty dressing/grooming: Denies Difficulty climbing stairs: Reports Difficulty getting out of chair: Reports Difficulty using hands for taps, buttons, cutlery, and/or writing: Reports   Review of Systems  Constitutional: Positive for fatigue. Negative for night sweats, weight gain, weight loss and weakness.  HENT: Positive for mouth dryness. Negative for mouth sores, trouble swallowing, trouble swallowing and nose dryness.   Eyes: Positive for dryness. Negative for pain, redness and visual disturbance.  Respiratory: Negative for cough, shortness of breath and difficulty breathing.   Cardiovascular: Negative for chest pain, palpitations, hypertension, irregular heartbeat and swelling in legs/feet.  Gastrointestinal: Positive for diarrhea. Negative for blood in stool and constipation.       IBS  Endocrine: Negative for increased urination.  Genitourinary: Negative for vaginal dryness.  Musculoskeletal: Positive for arthralgias, joint pain, joint swelling and morning stiffness. Negative for myalgias, muscle weakness, muscle tenderness and myalgias.  Skin: Negative for color change, rash, hair  loss, skin tightness, ulcers and sensitivity to sunlight.  Allergic/Immunologic: Negative for susceptible to infections.  Neurological: Negative for dizziness, memory loss and night sweats.  Hematological: Negative for swollen glands.  Psychiatric/Behavioral: Positive for depressed mood. Negative for sleep disturbance. The patient is not nervous/anxious.     PMFS History:  Patient Active Problem List   Diagnosis Date Noted  . Fibromyalgia 08/27/2016  . History of right rotator cuff tear repair 08/27/2016  . Diabetes 08/27/2016  . Chronic obstructive pulmonary disease ([place]) 08/27/2016  . Dyslipidemia 08/27/2016  . History of renal cell carcinoma 08/27/2016  . Anxiety 08/27/2016  . Sleep apnea 08/27/2016  . Rheumatoid arthritis of multiple sites with negative rheumatoid factor  07/18/2016  . High risk medication use 07/18/2016  . Hand pain 07/13/2016  . Foot pain 07/13/2016  . Elevated sed rate 07/13/2016  . Severe persistent asthma 04/30/2015  . Other allergic rhinitis 04/30/2015  . GERD (gastroesophageal reflux disease) 04/30/2015    Past Medical History:  Diagnosis Date  . Allergic rhinitis   . Asthma   . Cancer of kidney ([place])   . Dehydration   . GERD (gastroesophageal reflux disease)   . Hypothyroidism   . Kidney disease   . Sleep apnea     Family History  Problem Relation Age of Onset  . Asthma Mother   . Lung cancer Mother   . Asthma Father   . Allergic rhinitis Father   . COPD Father   . Heart attack Father   . Brain cancer Brother   . Brain cancer Paternal Uncle   . Asthma Maternal Grandmother   .  Brain cancer Paternal Grandmother   . Heart attack Brother    Past Surgical History:  Procedure Laterality Date  . APPENDECTOMY    . CESAREAN SECTION     2 times  . CHOLECYSTECTOMY    . KIDNEY SURGERY    . SHOULDER SURGERY    . TONSILLECTOMY     Social History   Social History Narrative  . No narrative on file     Objective: Vital Signs: There  were no vitals taken for this visit.   Physical Exam  Constitutional: She is oriented to person, place, and time. She appears well-developed and well-nourished.  HENT:  Head: Normocephalic and atraumatic.  Eyes: Conjunctivae and EOM are normal.  Neck: Normal range of motion.  Cardiovascular: Normal rate, regular rhythm, normal heart sounds and intact distal pulses.   Pulmonary/Chest: Effort normal and breath sounds normal.  Abdominal: Soft. Bowel sounds are normal.  Lymphadenopathy:    She has no cervical adenopathy.  Neurological: She is alert and oriented to person, place, and time.  Skin: Skin is warm and dry. Capillary refill takes less than 2 seconds.  Psychiatric: She has a normal mood and affect. Her behavior is normal.  Nursing note and vitals reviewed.    Musculoskeletal Exam: C-spine and thoracic lumbar spine good range of motion. Shoulder joints elbow joints painful range of motion. She has synovitis over bilateral wrist joints bilateral MCPs and PIP joints. She is warmth and swelling in her bilateral knee joints and ankle joints.  CDAI Exam: CDAI Homunculus Exam:   Tenderness:  RUE: wrist LUE: wrist Right hand: 2nd MCP and 3rd MCP Left hand: 2nd MCP, 3rd MCP and 4th MCP RLE: tibiofemoral and tibiotalar LLE: tibiofemoral and tibiotalar  Swelling:  RUE: wrist LUE: wrist Right hand: 2nd MCP Left hand: 2nd MCP and 3rd MCP RLE: tibiofemoral LLE: tibiofemoral  Joint Counts:  CDAI Tender Joint count: 9 CDAI Swollen Joint count: 7  Global Assessments:  Patient Global Assessment: 8 Provider Global Assessment: 8  CDAI Calculated Score: 32    Investigation: No additional findings.   Imaging: No results found.  Speciality Comments: No specialty comments available.    Procedures:  No procedures performed Allergies: Methotrexate; Advair diskus [fluticasone-salmeterol]; Aspirin; Ciprofloxacin; Clonidine derivatives; Nystatin; Oxycodone; Serevent  [salmeterol]; and Cephalexin   Assessment / Plan:     Visit Diagnoses: Rheumatoid arthritis of multiple sites with negative rheumatoid factor : She continues to have severe disease with pain and synovitis in multiple joints. She's been on Humira for 1 month now with inadequate response. She has failed Enbrel in the past and had side effects from methotrexate. Different treatment options and their side effects were discussed. I would like to try [place] as a combination therapy. Indications side effects contraindications were discussed at length. Informed consent was taken. She'll be started on Arava 10 mg by mouth daily for 2 weeks if her labs are stable minimal increase it to 20 mg by mouth daily. If she fails the combination therapy may consider switching her to redirect Esmeralda Links or Morrie Sheldon.  High risk medication use: She is currently on Humira 40 mg every other week. We will check labs today. And then labs will be checked every 2 weeks 2 monitor for drug therapy.  Pain in both feet: She continues to have pain and discomfort in her bilateral feet  Pain in both hands: Pain and discomfort in her bilateral hands with synovitis  She also has knee joint pain and discomfort and warmth in  her knee joints  History of right rotator cuff tear repair  Fibromyalgia: She continues to have some generalized pain from fibromyalgia  Diabetes  Chronic obstructive pulmonary disease, unspecified COPD type ([place])  Dyslipidemia: Reviewed her previous lipid panel. It may be an issue once we start on exam.  History of renal cell carcinoma: She has lows stable GFR  Anxiety  Sleep apnea: Patient is using CPAP.   [place] Association of heart disease with rheumatoid arthritis was discussed. Need to monitor blood pressure, cholesterol, and to exercise 30-60 minutes on daily basis was discussed. Poor dental hygiene can be a predisposing factor for rheumatoid arthritis. Good dental hygiene was discussed.  Orders: No  orders of the defined types were placed in this encounter.  No orders of the defined types were placed in this encounter.   Face-to-face time spent with patient was 35 minutes. 50% of time was spent in counseling and coordination of care.  Follow-Up Instructions: No Follow-up on file.   Bo Merino, MD  Note - This record has been created using Editor, commissioning.  Chart creation errors have been sought, but may not always  have been located. Such creation errors do not reflect on  the standard of medical care.

## 2016-12-06 ENCOUNTER — Ambulatory Visit (INDEPENDENT_AMBULATORY_CARE_PROVIDER_SITE_OTHER): Payer: Medicare Other

## 2016-12-06 DIAGNOSIS — J4550 Severe persistent asthma, uncomplicated: Secondary | ICD-10-CM | POA: Diagnosis not present

## 2016-12-11 ENCOUNTER — Encounter: Payer: Self-pay | Admitting: Rheumatology

## 2016-12-12 ENCOUNTER — Other Ambulatory Visit: Payer: Self-pay | Admitting: *Deleted

## 2016-12-12 NOTE — Telephone Encounter (Signed)
Patient states she is not taking this medication due to side effects she had of severe diarrhea. Patient is due in the office on 12/13/16 to discuss other treatment options.

## 2016-12-12 NOTE — Telephone Encounter (Signed)
Refill request received via fax  Last Visit: 11/01/16 Next Visit: 12/13/16 Labs: 10/18/16 Creat 1.27 GFR 49 Previous creat 0.98 GFR 67   Okay to refill MTX?

## 2016-12-12 NOTE — Telephone Encounter (Signed)
Reduce MTX to 3 tabs po qweek

## 2016-12-13 ENCOUNTER — Ambulatory Visit (INDEPENDENT_AMBULATORY_CARE_PROVIDER_SITE_OTHER): Payer: Medicare Other | Admitting: Rheumatology

## 2016-12-13 ENCOUNTER — Encounter: Payer: Self-pay | Admitting: Rheumatology

## 2016-12-13 VITALS — BP 110/60 | HR 80 | Resp 18 | Wt 222.0 lb

## 2016-12-13 DIAGNOSIS — E1140 Type 2 diabetes mellitus with diabetic neuropathy, unspecified: Secondary | ICD-10-CM | POA: Diagnosis not present

## 2016-12-13 DIAGNOSIS — M79672 Pain in left foot: Secondary | ICD-10-CM

## 2016-12-13 DIAGNOSIS — Z85528 Personal history of other malignant neoplasm of kidney: Secondary | ICD-10-CM | POA: Diagnosis not present

## 2016-12-13 DIAGNOSIS — F419 Anxiety disorder, unspecified: Secondary | ICD-10-CM | POA: Diagnosis not present

## 2016-12-13 DIAGNOSIS — J449 Chronic obstructive pulmonary disease, unspecified: Secondary | ICD-10-CM

## 2016-12-13 DIAGNOSIS — Z79899 Other long term (current) drug therapy: Secondary | ICD-10-CM | POA: Diagnosis not present

## 2016-12-13 DIAGNOSIS — M0609 Rheumatoid arthritis without rheumatoid factor, multiple sites: Secondary | ICD-10-CM

## 2016-12-13 DIAGNOSIS — M797 Fibromyalgia: Secondary | ICD-10-CM

## 2016-12-13 DIAGNOSIS — G4737 Central sleep apnea in conditions classified elsewhere: Secondary | ICD-10-CM

## 2016-12-13 DIAGNOSIS — M79641 Pain in right hand: Secondary | ICD-10-CM | POA: Diagnosis not present

## 2016-12-13 DIAGNOSIS — M75121 Complete rotator cuff tear or rupture of right shoulder, not specified as traumatic: Secondary | ICD-10-CM

## 2016-12-13 DIAGNOSIS — E785 Hyperlipidemia, unspecified: Secondary | ICD-10-CM | POA: Diagnosis not present

## 2016-12-13 DIAGNOSIS — Z794 Long term (current) use of insulin: Secondary | ICD-10-CM

## 2016-12-13 DIAGNOSIS — M79642 Pain in left hand: Secondary | ICD-10-CM

## 2016-12-13 DIAGNOSIS — M79671 Pain in right foot: Secondary | ICD-10-CM

## 2016-12-13 LAB — COMPLETE METABOLIC PANEL WITH GFR
ALBUMIN: 4 g/dL (ref 3.6–5.1)
ALT: 18 U/L (ref 6–29)
AST: 12 U/L (ref 10–35)
Alkaline Phosphatase: 118 U/L (ref 33–130)
BILIRUBIN TOTAL: 0.4 mg/dL (ref 0.2–1.2)
BUN: 18 mg/dL (ref 7–25)
CO2: 25 mmol/L (ref 20–31)
CREATININE: 1.24 mg/dL — AB (ref 0.50–1.05)
Calcium: 9.2 mg/dL (ref 8.6–10.4)
Chloride: 105 mmol/L (ref 98–110)
GFR, EST NON AFRICAN AMERICAN: 50 mL/min — AB (ref >=60)
GFR, Est African American: 58 mL/min — ABNORMAL LOW (ref >=60)
GLUCOSE: 134 mg/dL — AB (ref 65–99)
Potassium: 4 mmol/L (ref 3.5–5.3)
SODIUM: 141 mmol/L (ref 135–146)
TOTAL PROTEIN: 7.2 g/dL (ref 6.1–8.1)

## 2016-12-13 LAB — CBC WITH DIFFERENTIAL/PLATELET
BASOS PCT: 1 %
Basophils Absolute: 104 cells/uL (ref 0–200)
Eosinophils Absolute: 208 cells/uL (ref 15–500)
Eosinophils Relative: 2 %
HCT: 40.7 % (ref 35.0–45.0)
HEMOGLOBIN: 13.3 g/dL (ref 11.7–15.5)
LYMPHS ABS: 1768 {cells}/uL (ref 850–3900)
Lymphocytes Relative: 17 %
MCH: 28.7 pg (ref 27.0–33.0)
MCHC: 32.7 g/dL (ref 32.0–36.0)
MCV: 87.7 fL (ref 80.0–100.0)
MONOS PCT: 6 %
MPV: 10.3 fL (ref 7.5–12.5)
Monocytes Absolute: 624 cells/uL (ref 200–950)
NEUTROS ABS: 7696 {cells}/uL (ref 1500–7800)
Neutrophils Relative %: 74 %
PLATELETS: 283 10*3/uL (ref 140–400)
RBC: 4.64 MIL/uL (ref 3.80–5.10)
RDW: 15.1 % — ABNORMAL HIGH (ref 11.0–15.0)
WBC: 10.4 10*3/uL (ref 3.8–10.8)

## 2016-12-13 MED ORDER — LEFLUNOMIDE 10 MG PO TABS: ORAL_TABLET | ORAL | 0 refills | Status: DC

## 2016-12-13 NOTE — Progress Notes (Signed)
Pharmacy Note  Subjective: Patient presents today to the [facility] to see Dr. Estanislado Pandy for follow up of rheumatoid arthritis.  She is currently taking Humira 40 mg every other week.  Patient started Humira on 11/02/16.  Patient is due for standing labs today.    Does the patient feel that his/her medications are working for him/her? Patient started Humira 11/02/16.  She continues to have pain and swelling in her hands.  Has the patient been experiencing any side effects to the medications prescribed?  No Does the patient have any problems obtaining medications?  No  Patient seen by the pharmacist for counseling on leflunomide Jolee Ewing).    Objective: CBC    Component Value Date/Time   WBC 10.5 10/18/2016 1029   RBC 4.42 10/18/2016 1029   HGB 12.0 10/18/2016 1029   HCT 38.0 10/18/2016 1029   PLT 280 10/18/2016 1029   MCV 86.0 10/18/2016 1029   MCH 27.1 10/18/2016 1029   MCHC 31.6 (L) 10/18/2016 1029   RDW 15.6 (H) 10/18/2016 1029   LYMPHSABS 2,310 10/18/2016 1029   MONOABS 945 10/18/2016 1029   EOSABS 210 10/18/2016 1029   BASOSABS 0 10/18/2016 1029   CMP     Component Value Date/Time   NA 141 10/18/2016 1029   K 4.1 10/18/2016 1029   CL 106 10/18/2016 1029   CO2 24 10/18/2016 1029   GLUCOSE 142 (H) 10/18/2016 1029   BUN 12 10/18/2016 1029   CREATININE 1.27 (H) 10/18/2016 1029   CALCIUM 8.6 10/18/2016 1029   PROT 6.6 10/18/2016 1029   ALBUMIN 3.6 10/18/2016 1029   AST 10 10/18/2016 1029   ALT 14 10/18/2016 1029   ALKPHOS 100 10/18/2016 1029   BILITOT 0.3 10/18/2016 1029   GFRNONAA 49 (L) 10/18/2016 1029   GFRAA 56 (L) 10/18/2016 1029   TB Gold: negative (06/15/16)  Pregnancy status:  Patient had tubal ligation  Vitals:   12/13/16 1018  BP: 110/60  Pulse: 80  Resp: 18    Assessment/Plan: Patient is being initiated on leflunomide (Arava) 10 mg daily for two weeks.  She will need labs two weeks after starting [place].  If labs are stable, will plan  to increase to Arava 20 mg daily.  Advised patient to get labs two weeks after dose is increased.  Patient was counseled on the purpose, proper use, and adverse effects of leflunomide including risk of infection, nausea/diarrhea/weight loss, increase in blood pressure, rash, hair loss, tingling in the hands and feet, and signs and symptoms of interstitial lung disease.  Discussed the importance of frequent monitoring of liver function and blood counts, and patient was provided with instructions for standing labs.  Provided patient with educational materials on leflunomide and answered all questions.  Patient consented to [place] use, and consent will be uploaded into the media tab.     [name], Pharm.D., BCPS, CPP Clinical Pharmacist Pager: [phone] Phone: [phone] 12/13/2016 10:40 AM

## 2016-12-13 NOTE — Patient Instructions (Addendum)
Standing Labs We placed an order today for your standing lab work.    Please come back and get your standing labs in 2 weeks x 2, then every 2 months  We have open lab Monday through Friday from 8:30-11:30 AM and 1:30-4 PM at the office of Dr. Tresa Moore, PA.   The office is located at [address], [place], [place], [place] [address] No appointment is necessary.   Labs are drawn by [facility].  You may receive a bill from [facility] for your lab work.    Start taking leflunomide (Arava) 10 mg daily for two weeks, then get labs.  If labs are stable, will plan to increase to leflunomide 20 mg daily.    Leflunomide tablets What is this medicine? LEFLUNOMIDE (le FLOO na mide) is for rheumatoid arthritis. This medicine may be used for other purposes; ask your health care provider or pharmacist if you have questions. COMMON BRAND NAME(S): Arava What should I tell my health care provider before I take this medicine? They need to know if you have any of these conditions: -alcoholism -bone marrow problems -fever or infection -immune system problems -kidney disease -liver disease -an unusual or allergic reaction to leflunomide, teriflunomide, other medicines, lactose, foods, dyes, or preservatives -pregnant or trying to get pregnant -breast-feeding How should I use this medicine? Take this medicine by mouth with a full glass of water. Follow the directions on the prescription label. Take your medicine at regular intervals. Do not take your medicine more often than directed. Do not stop taking except on your doctor's advice. Talk to your pediatrician regarding the use of this medicine in children. Special care may be needed. Overdosage: If you think you have taken too much of this medicine contact a poison control center or emergency room at once. NOTE: This medicine is only for you. Do not share this medicine with others. What if I miss a dose? If you miss a dose, take it  as soon as you can. If it is almost time for your next dose, take only that dose. Do not take double or extra doses. What may interact with this medicine? Do not take this medicine with any of the following medications: -teriflunomide This medicine may also interact with the following medications: -charcoal -cholestyramine -methotrexate -NSAIDs, medicines for pain and inflammation, like ibuprofen or naproxen -phenytoin -rifampin -tolbutamide -vaccines -warfarin This list may not describe all possible interactions. Give your health care provider a list of all the medicines, herbs, non-prescription drugs, or dietary supplements you use. Also tell them if you smoke, drink alcohol, or use illegal drugs. Some items may interact with your medicine. What should I watch for while using this medicine? Visit your doctor or health care professional for regular checks on your progress. You will need frequent blood checks while you are receiving the medicine. If you get a cold or other infection while receiving this medicine, call your doctor or health care professional. Do not treat yourself. The medicine may increase your risk of getting an infection. If you are a woman who has the potential to become pregnant, discuss birth control options with your doctor or health care professional. Dennis Bast must not be pregnant, and you must be using a reliable form of birth control. The medicine may harm an unborn baby. Immediately call your doctor if you think you might be pregnant. Alcoholic drinks may increase possible damage to your liver. Do not drink alcohol while taking this medicine. What side effects may  I notice from receiving this medicine? Side effects that you should report to your doctor or health care professional as soon as possible: -allergic reactions like skin rash, itching or hives, swelling of the face, lips, or tongue -cough -difficulty breathing or shortness of breath -fever, chills or any other  sign of infection -redness, blistering, peeling or loosening of the skin, including inside the mouth -unusual bleeding or bruising -unusually weak or tired -vomiting -yellowing of eyes or skin Side effects that usually do not require medical attention (report to your doctor or health care professional if they continue or are bothersome): -diarrhea -hair loss -headache -nausea This list may not describe all possible side effects. Call your doctor for medical advice about side effects. You may report side effects to FDA at 1-800-FDA-1088. Where should I keep my medicine? Keep out of the reach of children. Store at room temperature between 15 and 30 degrees C (59 and 86 degrees F). Protect from moisture and light. Throw away any unused medicine after the expiration date. NOTE: This sheet is a summary. It may not cover all possible information. If you have questions about this medicine, talk to your doctor, pharmacist, or health care provider.  2018 Elsevier/Gold Standard (2013-08-12 10:53:11)

## 2016-12-14 NOTE — Progress Notes (Signed)
LABS stable

## 2016-12-19 ENCOUNTER — Telehealth: Payer: Self-pay

## 2016-12-19 NOTE — Telephone Encounter (Signed)
Noted patient's last refill of was on 11/28/16 at [facility]  I called patient with a refill reminder call.  Patient confirms she does need a refill at this time.  Will have the outpatient pharmacy process patient's prescription at this time.    Keyon Winnick, [place], CPhT 10:24 AM

## 2016-12-20 ENCOUNTER — Ambulatory Visit (INDEPENDENT_AMBULATORY_CARE_PROVIDER_SITE_OTHER): Payer: Medicare Other | Admitting: *Deleted

## 2016-12-20 DIAGNOSIS — Z862 Personal history of diseases of the blood and blood-forming organs and certain disorders involving the immune mechanism: Secondary | ICD-10-CM | POA: Diagnosis not present

## 2016-12-20 DIAGNOSIS — J4550 Severe persistent asthma, uncomplicated: Secondary | ICD-10-CM

## 2016-12-20 MED FILL — HUMIRA PEN 40 MG/0.8ML PNKT: 40 | 28 days supply | Qty: 2 | Fill #2

## 2017-01-02 ENCOUNTER — Ambulatory Visit: Payer: Medicare Other | Admitting: Rheumatology

## 2017-01-11 ENCOUNTER — Encounter: Payer: Self-pay | Admitting: Rheumatology

## 2017-01-11 ENCOUNTER — Other Ambulatory Visit: Payer: Self-pay | Admitting: Rheumatology

## 2017-01-12 NOTE — Progress Notes (Signed)
Office Visit Note  Patient: Kimberly Jenkins             Date of Birth: 02-27-1966           MRN: [id number]             PCP: Welford Roche, NP Referring: Welford Roche, NP Visit Date: 01/24/2017 Occupation: @GUAROCC @    Subjective:  Hand pain   History of Present Illness: Kimberly Jenkins is a 51 y.o. female with history of seronegative rheumatoid arthritis. She states she's doing much better on the combination of Humira and Arava. She still continues to have some discomfort in her hands. She describes discomfort mostly in her left hand. She still have a stiffness and tightness in her left hand. She is some popping sensation in her ankles. She denies any joint swelling.  Activities of Daily Living:  Patient reports morning stiffness for 20 minutes.   Patient Reports nocturnal pain.  Difficulty dressing/grooming: Denies Difficulty climbing stairs: Reports Difficulty getting out of chair: Reports Difficulty using hands for taps, buttons, cutlery, and/or writing: Reports   Review of Systems  Constitutional: Positive for fatigue. Negative for night sweats, weight gain, weight loss and weakness.  HENT: Positive for mouth dryness. Negative for mouth sores, trouble swallowing, trouble swallowing and nose dryness.   Eyes: Negative for pain, redness, visual disturbance and dryness.  Respiratory: Positive for shortness of breath. Negative for cough and difficulty breathing.        COPD  Cardiovascular: Negative for chest pain, palpitations, hypertension, irregular heartbeat and swelling in legs/feet.  Gastrointestinal: Negative for blood in stool, constipation and diarrhea.  Endocrine: Negative for increased urination.  Genitourinary: Negative for vaginal dryness.  Musculoskeletal: Positive for arthralgias, joint pain, joint swelling and morning stiffness. Negative for myalgias, muscle weakness, muscle tenderness and myalgias.  Skin: Negative for color change, rash, hair loss,  skin tightness, ulcers and sensitivity to sunlight.  Allergic/Immunologic: Negative for susceptible to infections.  Neurological: Negative for dizziness, memory loss and night sweats.  Hematological: Negative for swollen glands.  Psychiatric/Behavioral: Positive for depressed mood and sleep disturbance. The patient is nervous/anxious.        On CPAP on oxygen 2L    PMFS History:  Patient Active Problem List   Diagnosis Date Noted  . Fibromyalgia 08/27/2016  . History of right rotator cuff tear repair 08/27/2016  . Diabetes 08/27/2016  . Chronic obstructive pulmonary disease ([place]) 08/27/2016  . Dyslipidemia 08/27/2016  . History of renal cell carcinoma 08/27/2016  . Anxiety 08/27/2016  . Sleep apnea 08/27/2016  . Rheumatoid arthritis of multiple sites with negative rheumatoid factor  07/18/2016  . High risk medication use 07/18/2016  . Hand pain 07/13/2016  . Foot pain 07/13/2016  . Elevated sed rate 07/13/2016  . Severe persistent asthma 04/30/2015  . Other allergic rhinitis 04/30/2015  . GERD (gastroesophageal reflux disease) 04/30/2015    Past Medical History:  Diagnosis Date  . Allergic rhinitis   . Asthma   . Cancer of kidney ([place])   . Dehydration   . GERD (gastroesophageal reflux disease)   . Hypothyroidism   . Kidney disease   . Sleep apnea     Family History  Problem Relation Age of Onset  . Brain cancer Brother   . Asthma Mother   . Lung cancer Mother   . Asthma Father   . Allergic rhinitis Father   . COPD Father   . Heart attack Father   . Brain cancer Paternal  Uncle   . Asthma Maternal Grandmother   . Brain cancer Paternal Grandmother   . Heart attack Brother    Past Surgical History:  Procedure Laterality Date  . APPENDECTOMY    . CESAREAN SECTION     2 times  . CHOLECYSTECTOMY    . KIDNEY SURGERY    . SHOULDER SURGERY    . TONSILLECTOMY     Social History   Social History Narrative  . No narrative on file     Objective: Vital Signs:  BP 112/62   Pulse 80   Resp 14   Wt 215 lb (97.5 kg)   BMI 38.70 kg/m    Physical Exam   Musculoskeletal Exam: C-spine and thoracic lumbar spine good range of motion. Shoulder joints elbow joints are good range of motion. She has synovitis and tenderness over her left wrist joint. She has some tenderness across her MCPs and PIPs as described below. She is warmth in her right knee joint. Wrist joints are good range of motion.  CDAI Exam: CDAI Homunculus Exam:   Tenderness:  LUE: wrist Left hand: 2nd MCP, 3rd MCP, 4th MCP, 2nd PIP, 3rd PIP, 4th PIP and 5th PIP RLE: tibiofemoral  Swelling:  LUE: wrist RLE: tibiofemoral  Joint Counts:  CDAI Tender Joint count: 9 CDAI Swollen Joint count: 2  Global Assessments:  Patient Global Assessment: 6 Provider Global Assessment: 6  CDAI Calculated Score: 23    Investigation: Findings:  06/17/2016 negative TB gold  CBC Latest Ref Rng & Units 01/15/2017 12/13/2016 10/18/2016  WBC 3.8 - 10.8 K/uL 7.1 10.4 10.5  Hemoglobin 11.7 - 15.5 g/dL 12.5 13.3 12.0  Hematocrit 35.0 - 45.0 % 39.8 40.7 38.0  Platelets 140 - 400 K/uL 231 283 280    CMP Latest Ref Rng & Units 01/15/2017 12/13/2016 10/18/2016  Glucose 65 - 99 mg/dL 137(H) 134(H) 142(H)  BUN 7 - 25 mg/dL 12 18 12   Creatinine 0.50 - 1.05 mg/dL 1.13(H) 1.24(H) 1.27(H)  Sodium 135 - 146 mmol/L 142 141 141  Potassium 3.5 - 5.3 mmol/L 4.0 4.0 4.1  Chloride 98 - 110 mmol/L 110 105 106  CO2 20 - 31 mmol/L 22 25 24   Calcium 8.6 - 10.4 mg/dL 8.8 9.2 8.6  Total Protein 6.1 - 8.1 g/dL 6.4 7.2 6.6  Total Bilirubin 0.2 - 1.2 mg/dL 0.4 0.4 0.3  Alkaline Phos 33 - 130 U/L 104 118 100  AST 10 - 35 U/L 14 12 10   ALT 6 - 29 U/L 13 18 14     Imaging: No results found.  Speciality Comments: No specialty comments available.    Procedures:  Medium Joint Inj Date/Time: 01/24/2017 11:17 AM Performed by: Bo Merino Authorized by: Bo Merino   Consent Given by:  Patient Site  marked: the procedure site was marked   Timeout: prior to procedure the correct patient, procedure, and site was verified   Indications:  Pain and joint swelling Location:  Wrist Site:  L radiocarpal Needle Size:  27 G Needle Length:  1.5 inches Approach:  Dorsal Ultrasound Guided: No   Fluoroscopic Guidance: No   Medications:  1 mL lidocaine 1 %; 30 mg triamcinolone acetonide 40 MG/ML Aspiration Attempted: Yes   Aspirate amount (mL):  0 Patient tolerance:  Patient tolerated the procedure well with no immediate complications   Allergies: Methotrexate; Advair diskus [fluticasone-salmeterol]; Aspirin; Ciprofloxacin; Clonidine derivatives; Nystatin; Oxycodone; Serevent [salmeterol]; and Cephalexin   Assessment / Plan:     Visit Diagnoses: Rheumatoid arthritis of  multiple sites with negative rheumatoid factor : She still have quite active disease with pain and swelling in multiple joints. Overall she's been quite happy with the combination therapy. Her Jolee Ewing was recently increased.  Elevated sed rate  High risk medication use - Humira 40 mg subcutaneous every other week(11/02/2016), Arava 20 mg by mouth daily (12/13/2016) (inadequate response to Enbrel, side effects from methotrexate). She has low GFR but still stable.  History of right rotator cuff tear repair: Decreased range of motion  Pain in both hands: She is been and swelling in her left hand especially her left wrist joint per request after informed consent was obtained left wrist joint was prepped in sterile fashion injected with cortisone which she tolerated well. I've also given her a prescription for left wrist brace.  Pain in both feet and proper fitting shoes were given.  History of renal cell carcinoma  Fibromyalgia: She does have generalized pain  History of sleep apnea - On CPAP  History of asthma  History of COPD  History of hyperlipidemia  Pain in left wrist - Plan: DME Other see comment    Orders: Orders  Placed This Encounter  Procedures  . Medium Joint Injection/Arthrocentesis  . Hand/Upper Extremity Injection/Arthrocentesis  . Medium Joint Injection/Arthrocentesis  . DME Other see comment   No orders of the defined types were placed in this encounter.   Face-to-face time spent with patient was 30 minutes. 50% of time was spent in counseling and coordination of care.  Follow-Up Instructions: Return in about 3 months (around 04/26/2017) for Rheumatoid arthritis.   Bo Merino, MD  Note - This record has been created using Editor, commissioning.  Chart creation errors have been sought, but may not always  have been located. Such creation errors do not reflect on  the standard of medical care.

## 2017-01-15 ENCOUNTER — Other Ambulatory Visit: Payer: Self-pay

## 2017-01-15 ENCOUNTER — Other Ambulatory Visit: Payer: Self-pay | Admitting: Rheumatology

## 2017-01-15 ENCOUNTER — Telehealth: Payer: Self-pay | Admitting: Pharmacist

## 2017-01-15 DIAGNOSIS — Z79899 Other long term (current) drug therapy: Secondary | ICD-10-CM

## 2017-01-15 LAB — CBC WITH DIFFERENTIAL/PLATELET
Basophils Absolute: 71 cells/uL (ref 0–200)
Basophils Relative: 1 %
EOS PCT: 4 %
Eosinophils Absolute: 284 cells/uL (ref 15–500)
HCT: 39.8 % (ref 35.0–45.0)
HEMOGLOBIN: 12.5 g/dL (ref 11.7–15.5)
LYMPHS ABS: 1917 {cells}/uL (ref 850–3900)
LYMPHS PCT: 27 %
MCH: 27.6 pg (ref 27.0–33.0)
MCHC: 31.4 g/dL — AB (ref 32.0–36.0)
MCV: 87.9 fL (ref 80.0–100.0)
MONOS PCT: 12 %
MPV: 10.4 fL (ref 7.5–12.5)
Monocytes Absolute: 852 cells/uL (ref 200–950)
Neutro Abs: 3976 cells/uL (ref 1500–7800)
Neutrophils Relative %: 56 %
PLATELETS: 231 10*3/uL (ref 140–400)
RBC: 4.53 MIL/uL (ref 3.80–5.10)
RDW: 14.9 % (ref 11.0–15.0)
WBC: 7.1 10*3/uL (ref 3.8–10.8)

## 2017-01-15 NOTE — Telephone Encounter (Signed)
I spoke to patient while she was in the office today getting labs.  Patient confirms she is taking leflunomide 10 mg daily.  She reports some diarrhea with the medication.  Patient has history of inflammatory bowel disease.  Patient confirms she is staying hydrated and the diarrhea is tolerable.  I explained to patient that the diarrhea is often transient, but I advised patient to contact us if diarrhea worsens.  Patient voiced understanding.   [name], Pharm.D., BCPS, CPP Clinical Pharmacist Pager: [phone] Phone: [phone] 01/15/2017 10:37 AM

## 2017-01-16 LAB — COMPLETE METABOLIC PANEL WITH GFR
ALT: 13 U/L (ref 6–29)
AST: 14 U/L (ref 10–35)
Albumin: 3.7 g/dL (ref 3.6–5.1)
Alkaline Phosphatase: 104 U/L (ref 33–130)
BUN: 12 mg/dL (ref 7–25)
CHLORIDE: 110 mmol/L (ref 98–110)
CO2: 22 mmol/L (ref 20–31)
Calcium: 8.8 mg/dL (ref 8.6–10.4)
Creat: 1.13 mg/dL — ABNORMAL HIGH (ref 0.50–1.05)
GFR, Est African American: 65 mL/min (ref >=60)
GFR, Est Non African American: 56 mL/min — ABNORMAL LOW (ref >=60)
GLUCOSE: 137 mg/dL — AB (ref 65–99)
POTASSIUM: 4 mmol/L (ref 3.5–5.3)
SODIUM: 142 mmol/L (ref 135–146)
Total Bilirubin: 0.4 mg/dL (ref 0.2–1.2)
Total Protein: 6.4 g/dL (ref 6.1–8.1)

## 2017-01-16 NOTE — Telephone Encounter (Signed)
Last Visit: 12/13/16 Next Visit: 12/28/16 Labs: 01/15/17 Stable  Okay to refill Arava?

## 2017-01-16 NOTE — Telephone Encounter (Signed)
ok 

## 2017-01-16 NOTE — Progress Notes (Signed)
stable °

## 2017-01-24 ENCOUNTER — Encounter: Payer: Self-pay | Admitting: Rheumatology

## 2017-01-24 ENCOUNTER — Ambulatory Visit (INDEPENDENT_AMBULATORY_CARE_PROVIDER_SITE_OTHER): Payer: Medicare Other | Admitting: Rheumatology

## 2017-01-24 VITALS — BP 112/62 | HR 80 | Resp 14 | Wt 215.0 lb

## 2017-01-24 DIAGNOSIS — M797 Fibromyalgia: Secondary | ICD-10-CM | POA: Diagnosis not present

## 2017-01-24 DIAGNOSIS — M25532 Pain in left wrist: Secondary | ICD-10-CM

## 2017-01-24 DIAGNOSIS — M79642 Pain in left hand: Secondary | ICD-10-CM

## 2017-01-24 DIAGNOSIS — M0609 Rheumatoid arthritis without rheumatoid factor, multiple sites: Secondary | ICD-10-CM

## 2017-01-24 DIAGNOSIS — Z8669 Personal history of other diseases of the nervous system and sense organs: Secondary | ICD-10-CM

## 2017-01-24 DIAGNOSIS — Z85528 Personal history of other malignant neoplasm of kidney: Secondary | ICD-10-CM

## 2017-01-24 DIAGNOSIS — R700 Elevated erythrocyte sedimentation rate: Secondary | ICD-10-CM

## 2017-01-24 DIAGNOSIS — M79671 Pain in right foot: Secondary | ICD-10-CM

## 2017-01-24 DIAGNOSIS — Z8709 Personal history of other diseases of the respiratory system: Secondary | ICD-10-CM | POA: Diagnosis not present

## 2017-01-24 DIAGNOSIS — M79641 Pain in right hand: Secondary | ICD-10-CM | POA: Diagnosis not present

## 2017-01-24 DIAGNOSIS — M75121 Complete rotator cuff tear or rupture of right shoulder, not specified as traumatic: Secondary | ICD-10-CM

## 2017-01-24 DIAGNOSIS — Z8639 Personal history of other endocrine, nutritional and metabolic disease: Secondary | ICD-10-CM | POA: Diagnosis not present

## 2017-01-24 DIAGNOSIS — Z79899 Other long term (current) drug therapy: Secondary | ICD-10-CM | POA: Diagnosis not present

## 2017-01-24 DIAGNOSIS — M79672 Pain in left foot: Secondary | ICD-10-CM

## 2017-01-24 MED ORDER — ADALIMUMAB 40 MG/0.8ML ~~LOC~~ AJKT: 40.0000 mg | AUTO-INJECTOR | SUBCUTANEOUS | 2 refills | Status: DC

## 2017-01-24 MED ORDER — LIDOCAINE HCL 1 % IJ SOLN
1.0000 mL | INTRAMUSCULAR | Status: AC | PRN
  Administered 2017-01-24: 1 mL

## 2017-01-24 MED ORDER — TRIAMCINOLONE ACETONIDE 40 MG/ML IJ SUSP
30.0000 mg | INTRAMUSCULAR | Status: AC | PRN
  Administered 2017-01-24: 30 mg via INTRA_ARTICULAR

## 2017-01-24 MED FILL — HUMIRA PEN 40 MG/0.8ML PNKT: 40 | 28 days supply | Qty: 2 | Fill #0

## 2017-01-24 NOTE — Patient Instructions (Addendum)
Standing Labs We placed an order today for your standing lab work.    Please come back and get your standing labs in  one week and then every 2 months  We have open lab Monday through Friday from 8:30-11:30 AM and 1:30-4 PM at the office of Dr. Tresa Moore, PA.   The office is located at [address] [place], [place], [place] [address] No appointment is necessary.   Labs are drawn by [facility].  You may receive a bill from [facility] for your lab work. If you have any questions regarding directions or hours of operation,  please call [phone].

## 2017-02-01 ENCOUNTER — Other Ambulatory Visit: Payer: Self-pay | Admitting: Radiology

## 2017-02-01 DIAGNOSIS — Z79899 Other long term (current) drug therapy: Secondary | ICD-10-CM

## 2017-02-01 LAB — CBC WITH DIFFERENTIAL/PLATELET
BASOS ABS: 0 {cells}/uL (ref 0–200)
Basophils Relative: 0 %
EOS PCT: 2 %
Eosinophils Absolute: 144 cells/uL (ref 15–500)
HCT: 44.7 % (ref 35.0–45.0)
Hemoglobin: 14.7 g/dL (ref 11.7–15.5)
Lymphocytes Relative: 20 %
Lymphs Abs: 1440 cells/uL (ref 850–3900)
MCH: 28.4 pg (ref 27.0–33.0)
MCHC: 32.9 g/dL (ref 32.0–36.0)
MCV: 86.5 fL (ref 80.0–100.0)
MONOS PCT: 11 %
MPV: 10.6 fL (ref 7.5–12.5)
Monocytes Absolute: 792 cells/uL (ref 200–950)
NEUTROS ABS: 4824 {cells}/uL (ref 1500–7800)
NEUTROS PCT: 67 %
PLATELETS: 265 10*3/uL (ref 140–400)
RBC: 5.17 MIL/uL — ABNORMAL HIGH (ref 3.80–5.10)
RDW: 14.3 % (ref 11.0–15.0)
WBC: 7.2 10*3/uL (ref 3.8–10.8)

## 2017-02-01 LAB — COMPLETE METABOLIC PANEL WITH GFR
ALT: 14 U/L (ref 6–29)
AST: 15 U/L (ref 10–35)
Albumin: 4.3 g/dL (ref 3.6–5.1)
Alkaline Phosphatase: 119 U/L (ref 33–130)
BUN: 13 mg/dL (ref 7–25)
CO2: 22 mmol/L (ref 20–31)
Calcium: 9.4 mg/dL (ref 8.6–10.4)
Chloride: 105 mmol/L (ref 98–110)
Creat: 1.19 mg/dL — ABNORMAL HIGH (ref 0.50–1.05)
GFR, EST NON AFRICAN AMERICAN: 53 mL/min — AB (ref >=60)
GFR, Est African American: 61 mL/min (ref >=60)
GLUCOSE: 78 mg/dL (ref 65–99)
Potassium: 3.7 mmol/L (ref 3.5–5.3)
SODIUM: 141 mmol/L (ref 135–146)
Total Bilirubin: 0.5 mg/dL (ref 0.2–1.2)
Total Protein: 7.7 g/dL (ref 6.1–8.1)

## 2017-02-06 NOTE — Progress Notes (Signed)
Stable

## 2017-02-19 MED FILL — HUMIRA PEN 40 MG/0.8ML PNKT: 40 | 28 days supply | Qty: 2 | Fill #1

## 2017-02-26 ENCOUNTER — Other Ambulatory Visit: Payer: Self-pay | Admitting: Rheumatology

## 2017-02-26 NOTE — Telephone Encounter (Signed)
Last Visit: 01/24/17 Next Visit: 04/26/17 Labs: 02/01/17 Stable  Okay to refill per Dr. Estanislado Pandy

## 2017-03-14 MED FILL — HUMIRA PEN 40 MG/0.8ML PNKT: 40 | 28 days supply | Qty: 2 | Fill #2

## 2017-03-27 ENCOUNTER — Other Ambulatory Visit: Payer: Self-pay | Admitting: Rheumatology

## 2017-03-27 NOTE — Telephone Encounter (Signed)
Last Visit: 01/24/17 Next Visit: 04/26/17 Labs: 02/01/17 Stable  Okay to refill per Dr. Estanislado Pandy

## 2017-04-06 ENCOUNTER — Other Ambulatory Visit: Payer: Self-pay | Admitting: Rheumatology

## 2017-04-06 ENCOUNTER — Encounter: Payer: Self-pay | Admitting: Rheumatology

## 2017-04-06 MED FILL — HUMIRA PEN 40 MG/0.8ML PNKT: 40 | 28 days supply | Qty: 2 | Fill #0

## 2017-04-06 NOTE — Telephone Encounter (Signed)
Last Visit: 01/24/17 Next Visit: 04/26/17 Labs: 02/01/17 Stable 06/17/2016 negative TB gold  Okay to refill per Dr. Estanislado Pandy

## 2017-04-21 NOTE — Progress Notes (Deleted)
Office Visit Note  Patient: Kimberly Jenkins             Date of Birth: [birth year]           MRN: [id number]             PCP: Welford Roche, NP Referring: Welford Roche, NP Visit Date: 04/26/2017 Occupation: @GUAROCC @    Subjective:  No chief complaint on file.   History of Present Illness: Kimberly Jenkins is a 51 y.o. female ***   Activities of Daily Living:  Patient reports morning stiffness for *** {minute/hour:19697}.   Patient {ACTIONS;DENIES/REPORTS:21021675::"Denies"} nocturnal pain.  Difficulty dressing/grooming: {ACTIONS;DENIES/REPORTS:21021675::"Denies"} Difficulty climbing stairs: {ACTIONS;DENIES/REPORTS:21021675::"Denies"} Difficulty getting out of chair: {ACTIONS;DENIES/REPORTS:21021675::"Denies"} Difficulty using hands for taps, buttons, cutlery, and/or writing: {ACTIONS;DENIES/REPORTS:21021675::"Denies"}   No Rheumatology ROS completed.   PMFS History:  Patient Active Problem List   Diagnosis Date Noted  . Fibromyalgia 08/27/2016  . History of right rotator cuff tear repair 08/27/2016  . Diabetes 08/27/2016  . Chronic obstructive pulmonary disease ([place]) 08/27/2016  . Dyslipidemia 08/27/2016  . History of renal cell carcinoma 08/27/2016  . Anxiety 08/27/2016  . Sleep apnea 08/27/2016  . Rheumatoid arthritis of multiple sites with negative rheumatoid factor  07/18/2016  . High risk medication use 07/18/2016  . Hand pain 07/13/2016  . Foot pain 07/13/2016  . Elevated sed rate 07/13/2016  . Severe persistent asthma 04/30/2015  . Other allergic rhinitis 04/30/2015  . GERD (gastroesophageal reflux disease) 04/30/2015    Past Medical History:  Diagnosis Date  . Allergic rhinitis   . Asthma   . Cancer of kidney ([place])   . Dehydration   . GERD (gastroesophageal reflux disease)   . Hypothyroidism   . Kidney disease   . Sleep apnea     Family History  Problem Relation Age of Onset  . Brain cancer Brother   . Asthma Mother   . Lung cancer  Mother   . Asthma Father   . Allergic rhinitis Father   . COPD Father   . Heart attack Father   . Brain cancer Paternal Uncle   . Asthma Maternal Grandmother   . Brain cancer Paternal Grandmother   . Heart attack Brother    Past Surgical History:  Procedure Laterality Date  . APPENDECTOMY    . CESAREAN SECTION     2 times  . CHOLECYSTECTOMY    . KIDNEY SURGERY    . SHOULDER SURGERY    . TONSILLECTOMY     Social History   Social History Narrative  . No narrative on file     Objective: Vital Signs: There were no vitals taken for this visit.   Physical Exam   Musculoskeletal Exam: ***  CDAI Exam: No CDAI exam completed.    Investigation: No additional findings.TB Gold 05/2016 CBC Latest Ref Rng & Units 02/01/2017 01/15/2017 12/13/2016  WBC 3.8 - 10.8 K/uL 7.2 7.1 10.4  Hemoglobin 11.7 - 15.5 g/dL 14.7 12.5 13.3  Hematocrit 35.0 - 45.0 % 44.7 39.8 40.7  Platelets 140 - 400 K/uL 265 231 283   CMP     Component Value Date/Time   NA 141 02/01/2017 1212   K 3.7 02/01/2017 1212   CL 105 02/01/2017 1212   CO2 22 02/01/2017 1212   GLUCOSE 78 02/01/2017 1212   BUN 13 02/01/2017 1212   CREATININE 1.19 (H) 02/01/2017 1212   CALCIUM 9.4 02/01/2017 1212   PROT 7.7 02/01/2017 1212   ALBUMIN 4.3 02/01/2017 1212   AST  15 02/01/2017 1212   ALT 14 02/01/2017 1212   ALKPHOS 119 02/01/2017 1212   BILITOT 0.5 02/01/2017 1212   GFRNONAA 53 (L) 02/01/2017 1212   GFRAA 61 02/01/2017 1212    Imaging: No results found.  Speciality Comments: No specialty comments available.    Procedures:  No procedures performed Allergies: Methotrexate; Advair diskus [fluticasone-salmeterol]; Aspirin; Ciprofloxacin; Clonidine derivatives; Nystatin; Oxycodone; Serevent [salmeterol]; and Cephalexin   Assessment / Plan:     Visit Diagnoses: Rheumatoid arthritis of multiple sites with negative rheumatoid factor   High risk medication use -  Humira 40 mg subcutaneous every other  week(11/02/2016), Arava 20 mg by mouth daily (12/13/2016) (inadequate response to Enbrel, side effects from methotrexate)  History of right rotator cuff tear repair  Fibromyalgia  Diabetes  Dyslipidemia  Chronic obstructive pulmonary disease, unspecified COPD type ([place])  Anxiety  Gastroesophageal reflux disease, esophagitis presence not specified  Severe persistent asthma without complication  Sleep apnea    Orders: No orders of the defined types were placed in this encounter.  No orders of the defined types were placed in this encounter.   Face-to-face time spent with patient was *** minutes. 50% of time was spent in counseling and coordination of care.  Follow-Up Instructions: No Follow-up on file.   Bo Merino, MD  Note - This record has been created using Editor, commissioning.  Chart creation errors have been sought, but may not always  have been located. Such creation errors do not reflect on  the standard of medical care.

## 2017-04-26 ENCOUNTER — Ambulatory Visit: Payer: Medicare Other | Admitting: Rheumatology

## 2017-05-01 MED FILL — HUMIRA PEN 40 MG/0.8ML PNKT: 40 | 28 days supply | Qty: 2 | Fill #1

## 2017-05-14 ENCOUNTER — Encounter: Payer: Self-pay | Admitting: Allergy and Immunology

## 2017-05-14 ENCOUNTER — Encounter: Payer: Self-pay | Admitting: Rheumatology

## 2017-05-16 ENCOUNTER — Ambulatory Visit (INDEPENDENT_AMBULATORY_CARE_PROVIDER_SITE_OTHER): Payer: Medicare Other | Admitting: *Deleted

## 2017-05-16 DIAGNOSIS — J4550 Severe persistent asthma, uncomplicated: Secondary | ICD-10-CM

## 2017-05-25 MED FILL — HUMIRA PEN 40 MG/0.8ML PNKT: 40 | 28 days supply | Qty: 2 | Fill #2

## 2017-05-30 ENCOUNTER — Encounter: Payer: Self-pay | Admitting: Allergy and Immunology

## 2017-05-30 ENCOUNTER — Ambulatory Visit (INDEPENDENT_AMBULATORY_CARE_PROVIDER_SITE_OTHER): Payer: Medicare Other | Admitting: Allergy and Immunology

## 2017-05-30 VITALS — BP 112/62 | HR 68 | Resp 18

## 2017-05-30 DIAGNOSIS — J3089 Other allergic rhinitis: Secondary | ICD-10-CM

## 2017-05-30 DIAGNOSIS — J4550 Severe persistent asthma, uncomplicated: Secondary | ICD-10-CM

## 2017-05-30 DIAGNOSIS — G4730 Sleep apnea, unspecified: Secondary | ICD-10-CM | POA: Diagnosis not present

## 2017-05-30 DIAGNOSIS — K219 Gastro-esophageal reflux disease without esophagitis: Secondary | ICD-10-CM

## 2017-05-30 NOTE — Progress Notes (Signed)
Follow-up Note  Referring Provider: Welford Roche, NP Primary Provider: Welford Roche, NP Date of Office Visit: 05/30/2017  Subjective:   Kimberly Jenkins (DOB: 05/10/1966) is a 51 y.o. female who returns to the [facility] on 05/30/2017 in re-evaluation of the following:  HPI: Kimberly Jenkins returns to this clinic in reevaluation of her severe asthma treated with omalizumab, allergic rhinitis, and LPR. Her last visit to this clinic was November 2017.  She unfortunately stopped using omalizumab injections because of a logistical issue and the fact that she has been having problems over the course of the past several months with recurrent diarrhea for which she is now seeing Dr. Lyda Jester and is scheduled for colonoscopy on October 9.  She continues to have a requirement for bronchodilator multiple times a day. She will use DuoNeb twice a day and if she exerts herself she will use a MDI albuterol. This appears to occur even in the face of utilizing her omalizumab and Breo prescribed by her pulmonologist. She has not required a systemic steroid to treat an exacerbation of asthma.  Most recently she has contracted a respiratory tract infection with nasal congestion and laryngitis and coughing for which she was administered doxycycline and a cough medication and she is improving over the course of the past several days.  Her nose has been doing relatively well on montelukast and an antihistamine. It does not sound as though she has required a antibiotic to treat an episode of sinusitis.  Her reflux has been under excellent control on her current medical therapy which includes omeprazole and a H2 receptor blocker.  She is using a new CPAP mask which is working much better and she is very satisfied with the response she is receiving with that mask.  She is now on Humira and has been so for the past 4 months every 2 weeks for her rheumatoid arthritis and her hands are much  better.  Allergies as of 05/30/2017      Reactions   Methotrexate Diarrhea   Advair Diskus [fluticasone-salmeterol]    Aspirin    Ciprofloxacin    Clonidine Derivatives    Nystatin    Oxycodone    Serevent [salmeterol]    Cephalexin Nausea And Vomiting      Medication List      BREO ELLIPTA IN Inhale 1 puff into the lungs daily.   busPIRone 15 MG tablet Commonly known as:  BUSPAR Take one tablet three times daily.   cetirizine 10 MG tablet Commonly known as:  ZYRTEC Take 10 mg by mouth daily.   clorazepate 3.75 MG tablet Commonly known as:  TRANXENE Take one tablet four times a day as needed.   donepezil 10 MG tablet Commonly known as:  ARICEPT 10 mg daily   doxycycline 100 MG EC tablet Commonly known as:  DORYX Take 100 mg by mouth 2 (two) times daily.   EASY TOUCH PEN NEEDLES 31G X 8 MM Misc Generic drug:  Insulin Pen Needle   EPIPEN 2-PAK 0.3 mg/0.3 mL Soaj injection Generic drug:  EPINEPHrine Inject 0.3 mg into the muscle once.   famotidine 20 MG tablet Commonly known as:  PEPCID Take 1 tablet by mouth at bedtime.   FLUoxetine 40 MG capsule Commonly known as:  PROZAC Take 40 mg by mouth daily.   folic acid 1 MG tablet Commonly known as:  FOLVITE Take 2 tablets (2 mg total) by mouth daily.   furosemide 40 MG tablet Commonly known  as:  LASIX Take 40 mg by mouth.   gabapentin 400 MG capsule Commonly known as:  NEURONTIN Take 600 mg by mouth 3 (three) times daily.   HUMIRA PEN 40 MG/0.8ML Pnkt Generic drug:  Adalimumab INJECT 1 PEN INTO THE SKIN EVERY 14 DAYS.   HYDROcodone-acetaminophen 10-325 MG tablet Commonly known as:  NORCO Take by mouth.   HYDROcodone-homatropine 5-1.5 MG/5ML syrup Commonly known as:  HYCODAN Take 5 mLs by mouth every 6 (six) hours as needed for cough.   ipratropium-albuterol 0.5-2.5 (3) MG/3ML Soln Commonly known as:  DUONEB Take 3 mLs by nebulization every 4 (four) hours as needed.   COMBIVENT RESPIMAT 20-100  MCG/ACT Aers respimat Generic drug:  Ipratropium-Albuterol Inhale 1 puff into the lungs every 6 (six) hours as needed for wheezing.   LANTUS SOLOSTAR 100 UNIT/ML Solostar Pen Generic drug:  Insulin Glargine 85 Units nightly.   leflunomide 10 MG tablet Commonly known as:  ARAVA TAKE 2 TABLETS BY MOUTH EVERY DAY   levothyroxine 50 MCG tablet Commonly known as:  SYNTHROID, LEVOTHROID TAKE 1 TABLET BY MOUTH EVERY DAY AT 6 AM   metFORMIN 500 MG tablet Commonly known as:  GLUCOPHAGE Take 500 mg by mouth 2 (two) times daily with a meal.   montelukast 10 MG tablet Commonly known as:  SINGULAIR Take 1 tablet (10 mg total) by mouth daily.   nystatin cream Commonly known as:  MYCOSTATIN   olopatadine 0.1 % ophthalmic solution Commonly known as:  PATANOL   omeprazole 40 MG capsule Commonly known as:  PRILOSEC Take 40 mg by mouth 2 (two) times daily.   potassium chloride SA 20 MEQ tablet Commonly known as:  K-DUR,KLOR-CON Take by mouth.   pramipexole 0.125 MG tablet Commonly known as:  MIRAPEX Take by mouth.   pravastatin 40 MG tablet Commonly known as:  PRAVACHOL Take 40 mg by mouth daily.   rizatriptan 10 MG tablet Commonly known as:  MAXALT Take 10 mg by mouth.   topiramate 25 MG tablet Commonly known as:  TOPAMAX 1 tab twice a day for 4 days, then 2 twice a day for 4 days, then 3 twice a day for 4 days then 4 twice a day.   triamcinolone cream 0.1 % Commonly known as:  KENALOG   triamterene-hydrochlorothiazide 37.5-25 MG capsule Commonly known as:  DYAZIDE Take 1 capsule by mouth daily.   VICTOZA 18 MG/3ML Sopn Generic drug:  liraglutide Inject 1.8 mg into the skin.   Vitamin D (Ergocalciferol) 50000 units Caps capsule Commonly known as:  DRISDOL Take by mouth every 7 (seven) days.   XOLAIR 150 MG injection Generic drug:  omalizumab Inject 375 mg into the skin every 14 (fourteen) days.       Past Medical History:  Diagnosis Date  . Allergic  rhinitis   . Asthma   . Cancer of kidney ([place])   . Dehydration   . GERD (gastroesophageal reflux disease)   . Hypothyroidism   . Kidney disease   . Sleep apnea     Past Surgical History:  Procedure Laterality Date  . APPENDECTOMY    . CESAREAN SECTION     2 times  . CHOLECYSTECTOMY    . KIDNEY SURGERY    . SHOULDER SURGERY    . TONSILLECTOMY      Review of systems negative except as noted in HPI / PMHx or noted below:  Review of Systems  Constitutional: Negative.   HENT: Negative.   Eyes: Negative.   Respiratory:  Negative.   Cardiovascular: Negative.   Gastrointestinal: Negative.   Genitourinary: Negative.   Musculoskeletal: Negative.   Skin: Negative.   Neurological: Negative.   Endo/Heme/Allergies: Negative.   Psychiatric/Behavioral: Negative.      Objective:   Vitals:   05/30/17 1512  BP: 112/62  Pulse: 68  Resp: 18          Physical Exam  Constitutional: She is well-developed, well-nourished, and in no distress.  HENT:  Head: Normocephalic.  Right Ear: Tympanic membrane, external ear and ear canal normal.  Left Ear: External ear and ear canal normal. A middle ear effusion (dull light reflex) is present.  Nose: Nose normal. No mucosal edema or rhinorrhea.  Mouth/Throat: Uvula is midline, oropharynx is clear and moist and mucous membranes are normal. No oropharyngeal exudate.  Eyes: Conjunctivae are normal.  Neck: Trachea normal. No tracheal tenderness present. No tracheal deviation present. No thyromegaly present.  Cardiovascular: Normal rate, regular rhythm, S1 normal, S2 normal and normal heart sounds.   No murmur heard. Pulmonary/Chest: Breath sounds normal. No stridor. No respiratory distress. She has no wheezes. She has no rales.  Musculoskeletal: She exhibits no edema.  Lymphadenopathy:       Head (right side): No tonsillar adenopathy present.       Head (left side): No tonsillar adenopathy present.    She has no cervical adenopathy.   Neurological: She is alert. Gait normal.  Skin: No rash noted. She is not diaphoretic. No erythema. Nails show no clubbing.  Psychiatric: Mood and affect normal.    Diagnostics:    Spirometry was performed and demonstrated an FEV1 of 1.66 at 66 % of predicted.  The patient had an Asthma Control Test with the following results: ACT Total Score: 18.    Assessment and Plan:   1. Asthma, severe persistent, well-controlled   2. Other allergic rhinitis   3. LPRD (laryngopharyngeal reflux disease)   4. Sleep apnea with use of continuous positive airway pressure (CPAP)     1. Continue Breo as prescribed by Dr. Alcide Clever  2. Continue Omeprazole 40 one tablet two times per day + Pepcid in evening  3. Continue Montelukast (singulair) 10 one tablet one time per day  4. Continue Zyrtec 10 one tablet one time per day  6. Use Duoneb nebulization or Combivent respimat two puffs every 4-6 hours if needed.  7. Continue xolair and epi-pen  8. Return in 6 months or earlier if problem.  Overall Kimberly Jenkins appears to be stable regarding her respiratory tract situation with stability defined as consistent use of a bronchodilator on a daily basis. She will continue to utilize the therapy mentioned above which includes treatment directed against respiratory tract inflammation and reflux and I will see her back in this clinic in 6 months or earlier if there is a problem.  Allena Katz, MD [facility] [place]

## 2017-05-30 NOTE — Patient Instructions (Signed)
  1. Continue Breo as prescribed by Dr. Alcide Clever  2. Continue Omeprazole 40 one tablet two times per day + Pepcid in evening  3. Continue Montelukast (singulair) 10 one tablet one time per day  4. Continue Zyrtec 10 one tablet one time per day  6. Use Duoneb nebulization or Combivent respimat two puffs every 4-6 hours if needed.  7. Continue xolair and epi-pen  8. Return in 6 months or earlier if problem.

## 2017-06-19 ENCOUNTER — Other Ambulatory Visit: Payer: Self-pay | Admitting: Rheumatology

## 2017-06-19 NOTE — Telephone Encounter (Signed)
Last Visit: 01/24/17 Next Visit: 06/26/17 Labs: 02/01/17 Stable 06/17/2016 negative TB gold  Okay to refill 30 day supply per Dr. Estanislado Pandy

## 2017-06-26 ENCOUNTER — Encounter: Payer: Self-pay | Admitting: Rheumatology

## 2017-06-26 ENCOUNTER — Other Ambulatory Visit: Payer: Self-pay | Admitting: *Deleted

## 2017-06-26 ENCOUNTER — Ambulatory Visit (INDEPENDENT_AMBULATORY_CARE_PROVIDER_SITE_OTHER): Payer: Medicare Other | Admitting: Rheumatology

## 2017-06-26 VITALS — BP 96/62 | HR 74 | Resp 18 | Ht 62.5 in | Wt 194.0 lb

## 2017-06-26 DIAGNOSIS — Z9225 Personal history of immunosupression therapy: Secondary | ICD-10-CM

## 2017-06-26 DIAGNOSIS — M797 Fibromyalgia: Secondary | ICD-10-CM

## 2017-06-26 DIAGNOSIS — M25512 Pain in left shoulder: Secondary | ICD-10-CM

## 2017-06-26 DIAGNOSIS — F5101 Primary insomnia: Secondary | ICD-10-CM

## 2017-06-26 DIAGNOSIS — Z79899 Other long term (current) drug therapy: Secondary | ICD-10-CM | POA: Diagnosis not present

## 2017-06-26 DIAGNOSIS — R5383 Other fatigue: Secondary | ICD-10-CM | POA: Diagnosis not present

## 2017-06-26 DIAGNOSIS — M0609 Rheumatoid arthritis without rheumatoid factor, multiple sites: Secondary | ICD-10-CM | POA: Diagnosis not present

## 2017-06-26 LAB — COMPLETE METABOLIC PANEL WITH GFR
AG RATIO: 1.4 (calc) (ref 1.0–2.5)
ALT: 11 U/L (ref 6–29)
AST: 12 U/L (ref 10–35)
Albumin: 4.1 g/dL (ref 3.6–5.1)
Alkaline phosphatase (APISO): 123 U/L (ref 33–130)
BUN / CREAT RATIO: 11 (calc) (ref 6–22)
BUN: 12 mg/dL (ref 7–25)
CO2: 26 mmol/L (ref 20–32)
CREATININE: 1.07 mg/dL — AB (ref 0.50–1.05)
Calcium: 9 mg/dL (ref 8.6–10.4)
Chloride: 107 mmol/L (ref 98–110)
GFR, EST AFRICAN AMERICAN: 69 mL/min/{1.73_m2} (ref >=60)
GFR, Est Non African American: 59 mL/min/{1.73_m2} — ABNORMAL LOW (ref >=60)
GLOBULIN: 2.9 g/dL (ref 1.9–3.7)
Glucose, Bld: 133 mg/dL — ABNORMAL HIGH (ref 65–99)
Potassium: 4 mmol/L (ref 3.5–5.3)
Sodium: 142 mmol/L (ref 135–146)
Total Bilirubin: 0.4 mg/dL (ref 0.2–1.2)
Total Protein: 7 g/dL (ref 6.1–8.1)

## 2017-06-26 LAB — CBC WITH DIFFERENTIAL/PLATELET
BASOS ABS: 41 {cells}/uL (ref 0–200)
Basophils Relative: 0.7 %
EOS PCT: 3.6 %
Eosinophils Absolute: 209 cells/uL (ref 15–500)
HEMATOCRIT: 40.3 % (ref 35.0–45.0)
Hemoglobin: 13.2 g/dL (ref 11.7–15.5)
LYMPHS ABS: 1607 {cells}/uL (ref 850–3900)
MCH: 27.6 pg (ref 27.0–33.0)
MCHC: 32.8 g/dL (ref 32.0–36.0)
MCV: 84.1 fL (ref 80.0–100.0)
MPV: 10.8 fL (ref 7.5–12.5)
Monocytes Relative: 10.6 %
Neutro Abs: 3329 cells/uL (ref 1500–7800)
Neutrophils Relative %: 57.4 %
Platelets: 234 10*3/uL (ref 140–400)
RBC: 4.79 10*6/uL (ref 3.80–5.10)
RDW: 13.3 % (ref 11.0–15.0)
Total Lymphocyte: 27.7 %
WBC: 5.8 10*3/uL (ref 3.8–10.8)
WBCMIX: 615 {cells}/uL (ref 200–950)

## 2017-06-26 MED ORDER — DICLOFENAC SODIUM 1 % TD GEL: TRANSDERMAL | 3 refills | Status: DC

## 2017-06-26 MED ORDER — LEFLUNOMIDE 10 MG PO TABS: 20.0000 mg | ORAL_TABLET | Freq: Every day | ORAL | 0 refills | Status: DC

## 2017-06-26 NOTE — Progress Notes (Signed)
Office Visit Note  Patient: Kimberly Jenkins             Date of Birth: 23-Dec-1965           MRN: [id number]             PCP: Welford Roche, NP Referring: Welford Roche, NP Visit Date: 06/26/2017 Occupation: @GUAROCC @    Subjective:  Rheumatoid Arthritis (Doing good)   History of Present Illness: Kimberly Jenkins is a 51 y.o. female  With a history of seronegative rheumatoid arthritis.  She is on a combination of Humira and Arava.  (Inadequate response to Enbrel and side effects from methotrexate).  The last visit to the office was Dec 28, 2016.  Patient reports that the injection that was given in the wrist by Dr Estanislado Pandy really helped a lot.  Today, patient states that she is happy with the combination of Humira and Arava. She is able to make better fist formation than in the past. However she is still unable to hold things with her hands that are of significant weight.  She also has a history of fibromyalgia.  She has been exercising lately.  She rates her fibromyalgia discomfort is 5 on a scale of 0-10. She is resting less and exercising more which is helping her control her fiber better.  Activities of Daily Living:  Patient reports morning stiffness for 30 minute.   Patient Reports nocturnal pain.  Difficulty dressing/grooming: Reports Difficulty climbing stairs: Reports Difficulty getting out of chair: Reports Difficulty using hands for taps, buttons, cutlery, and/or writing: Reports   Review of Systems  Constitutional: Positive for fatigue, night sweats and weakness.  HENT: Positive for mouth dryness.   Eyes: Negative for dryness.  Respiratory: Negative for shortness of breath.   Cardiovascular: Positive for swelling in legs/feet.  Gastrointestinal: Negative for nausea.  Endocrine: Positive for cold intolerance and excessive thirst.  Genitourinary: Negative for painful urination.  Musculoskeletal: Positive for arthralgias, gait problem, joint pain,  joint swelling, muscle weakness, morning stiffness and muscle tenderness.  Skin: Positive for hair loss and redness.  Neurological: Positive for light-headedness, numbness and night sweats.  Hematological: Positive for bruising/bleeding tendency.  Psychiatric/Behavioral: Negative for sleep disturbance.    PMFS History:  Patient Active Problem List   Diagnosis Date Noted  . Fibromyalgia 08/27/2016  . History of right rotator cuff tear repair 08/27/2016  . Diabetes 08/27/2016  . Chronic obstructive pulmonary disease ([place]) 08/27/2016  . Dyslipidemia 08/27/2016  . History of renal cell carcinoma 08/27/2016  . Anxiety 08/27/2016  . Sleep apnea 08/27/2016  . Rheumatoid arthritis of multiple sites with negative rheumatoid factor  07/18/2016  . High risk medication use 07/18/2016  . Hand pain 07/13/2016  . Foot pain 07/13/2016  . Elevated sed rate 07/13/2016  . Severe persistent asthma 04/30/2015  . Other allergic rhinitis 04/30/2015  . GERD (gastroesophageal reflux disease) 04/30/2015    Past Medical History:  Diagnosis Date  . Allergic rhinitis   . Asthma   . Cancer of kidney ([place])   . COPD (chronic obstructive pulmonary disease) ([place])   . Dehydration   . GERD (gastroesophageal reflux disease)   . Hypothyroidism   . Kidney disease   . Rheumatoid arthritis ([place])   . Sleep apnea     Family History  Problem Relation Age of Onset  . Brain cancer Brother   . Asthma Mother   . Lung cancer Mother   . Asthma Father   . Allergic rhinitis Father   .  COPD Father   . Heart attack Father   . Brain cancer Paternal Uncle   . Asthma Maternal Grandmother   . Brain cancer Paternal Grandmother   . Heart attack Brother    Past Surgical History:  Procedure Laterality Date  . APPENDECTOMY    . CESAREAN SECTION     2 times  . CHOLECYSTECTOMY    . KIDNEY SURGERY    . SHOULDER SURGERY    . TONSILLECTOMY    . TOTAL SHOULDER ARTHROPLASTY    . TUBAL LIGATION     Social History    Social History Narrative  . No narrative on file     Objective: Vital Signs: BP 96/62 (BP Location: Left Arm, Patient Position: Sitting, Cuff Size: Normal)   Pulse 74   Resp 18   Ht 5' 2.5" (1.588 m)   Wt 194 lb (88 kg)   BMI 34.92 kg/m    Physical Exam  Constitutional: She is oriented to person, place, and time. She appears well-developed and well-nourished.  HENT:  Head: Normocephalic and atraumatic.  Eyes: Pupils are equal, round, and reactive to light. EOM are normal.  Cardiovascular: Normal rate, regular rhythm and normal heart sounds.  Exam reveals no gallop and no friction rub.   No murmur heard. Pulmonary/Chest: Effort normal and breath sounds normal. She has no wheezes. She has no rales.  Abdominal: Soft. Bowel sounds are normal. She exhibits no distension. There is no tenderness. There is no guarding. No hernia.  Musculoskeletal: Normal range of motion. She exhibits no edema, tenderness or deformity.  Lymphadenopathy:    She has no cervical adenopathy.  Neurological: She is alert and oriented to person, place, and time. Coordination normal.  Skin: Skin is warm and dry. Capillary refill takes less than 2 seconds. No rash noted.  Psychiatric: She has a normal mood and affect. Her behavior is normal.  Nursing note and vitals reviewed.    Musculoskeletal Exam:  Full range of motion of all joints Grip strength is equal and strong bilaterally Fibromyalgia tender points 18 out of 18 positive  CDAI Exam: CDAI Homunculus Exam:   Joint Counts:  CDAI Tender Joint count: 0 CDAI Swollen Joint count: 0    Investigation: No additional findings. Orders Only on 02/01/2017  Component Date Value Ref Range Status  . WBC 02/01/2017 7.2  3.8 - 10.8 K/uL Final  . RBC 02/01/2017 5.17* 3.80 - 5.10 MIL/uL Final  . Hemoglobin 02/01/2017 14.7  11.7 - 15.5 g/dL Final  . HCT 02/01/2017 44.7  35.0 - 45.0 % Final  . MCV 02/01/2017 86.5  80.0 - 100.0 fL Final  . MCH 02/01/2017  28.4  27.0 - 33.0 pg Final  . MCHC 02/01/2017 32.9  32.0 - 36.0 g/dL Final  . RDW 02/01/2017 14.3  11.0 - 15.0 % Final  . Platelets 02/01/2017 265  140 - 400 K/uL Final  . MPV 02/01/2017 10.6  7.5 - 12.5 fL Final  . Neutro Abs 02/01/2017 4824  1,500 - 7,800 cells/uL Final  . Lymphs Abs 02/01/2017 1440  850 - 3,900 cells/uL Final  . Monocytes Absolute 02/01/2017 792  200 - 950 cells/uL Final  . Eosinophils Absolute 02/01/2017 144  15 - 500 cells/uL Final  . Basophils Absolute 02/01/2017 0  0 - 200 cells/uL Final  . Neutrophils Relative % 02/01/2017 67  % Final  . Lymphocytes Relative 02/01/2017 20  % Final  . Monocytes Relative 02/01/2017 11  % Final  . Eosinophils Relative 02/01/2017 2  %  Final  . Basophils Relative 02/01/2017 0  % Final  . Smear Review 02/01/2017 Criteria for review not met   Final  . Sodium 02/01/2017 141  135 - 146 mmol/L Final  . Potassium 02/01/2017 3.7  3.5 - 5.3 mmol/L Final  . Chloride 02/01/2017 105  98 - 110 mmol/L Final  . CO2 02/01/2017 22  20 - 31 mmol/L Final  . Glucose, Bld 02/01/2017 78  65 - 99 mg/dL Final  . BUN 02/01/2017 13  7 - 25 mg/dL Final  . Creat 02/01/2017 1.19* 0.50 - 1.05 mg/dL Final   Comment:   For patients > or = 53 years of age: The upper reference limit for Creatinine is approximately 13% higher for people identified as African-American.     . Total Bilirubin 02/01/2017 0.5  0.2 - 1.2 mg/dL Final  . Alkaline Phosphatase 02/01/2017 119  33 - 130 U/L Final  . AST 02/01/2017 15  10 - 35 U/L Final  . ALT 02/01/2017 14  6 - 29 U/L Final  . Total Protein 02/01/2017 7.7  6.1 - 8.1 g/dL Final  . Albumin 02/01/2017 4.3  3.6 - 5.1 g/dL Final  . Calcium 02/01/2017 9.4  8.6 - 10.4 mg/dL Final  . GFR, Est African American 02/01/2017 61  >=60 mL/min Final  . GFR, Est Non African American 02/01/2017 53* >=60 mL/min Final  Orders Only on 01/15/2017  Component Date Value Ref Range Status  . WBC 01/15/2017 7.1  3.8 - 10.8 K/uL Final  . RBC  01/15/2017 4.53  3.80 - 5.10 MIL/uL Final  . Hemoglobin 01/15/2017 12.5  11.7 - 15.5 g/dL Final  . HCT 01/15/2017 39.8  35.0 - 45.0 % Final  . MCV 01/15/2017 87.9  80.0 - 100.0 fL Final  . MCH 01/15/2017 27.6  27.0 - 33.0 pg Final  . MCHC 01/15/2017 31.4* 32.0 - 36.0 g/dL Final  . RDW 01/15/2017 14.9  11.0 - 15.0 % Final  . Platelets 01/15/2017 231  140 - 400 K/uL Final  . MPV 01/15/2017 10.4  7.5 - 12.5 fL Final  . Neutro Abs 01/15/2017 3976  1,500 - 7,800 cells/uL Final  . Lymphs Abs 01/15/2017 1917  850 - 3,900 cells/uL Final  . Monocytes Absolute 01/15/2017 852  200 - 950 cells/uL Final  . Eosinophils Absolute 01/15/2017 284  15 - 500 cells/uL Final  . Basophils Absolute 01/15/2017 71  0 - 200 cells/uL Final  . Neutrophils Relative % 01/15/2017 56  % Final  . Lymphocytes Relative 01/15/2017 27  % Final  . Monocytes Relative 01/15/2017 12  % Final  . Eosinophils Relative 01/15/2017 4  % Final  . Basophils Relative 01/15/2017 1  % Final  . Smear Review 01/15/2017 Criteria for review not met   Final  . Sodium 01/15/2017 142  135 - 146 mmol/L Final  . Potassium 01/15/2017 4.0  3.5 - 5.3 mmol/L Final  . Chloride 01/15/2017 110  98 - 110 mmol/L Final  . CO2 01/15/2017 22  20 - 31 mmol/L Final  . Glucose, Bld 01/15/2017 137* 65 - 99 mg/dL Final  . BUN 01/15/2017 12  7 - 25 mg/dL Final  . Creat 01/15/2017 1.13* 0.50 - 1.05 mg/dL Final   Comment:   For patients > or = 53 years of age: The upper reference limit for Creatinine is approximately 13% higher for people identified as African-American.     . Total Bilirubin 01/15/2017 0.4  0.2 - 1.2 mg/dL Final  . Alkaline  Phosphatase 01/15/2017 104  33 - 130 U/L Final  . AST 01/15/2017 14  10 - 35 U/L Final  . ALT 01/15/2017 13  6 - 29 U/L Final  . Total Protein 01/15/2017 6.4  6.1 - 8.1 g/dL Final  . Albumin 01/15/2017 3.7  3.6 - 5.1 g/dL Final  . Calcium 01/15/2017 8.8  8.6 - 10.4 mg/dL Final  . GFR, Est African American 01/15/2017 65   >=60 mL/min Final  . GFR, Est Non African American 01/15/2017 56* >=60 mL/min Final     Imaging: No results found.  Speciality Comments: No specialty comments available.    Procedures:  No procedures performed Allergies: Methotrexate; Advair diskus [fluticasone-salmeterol]; Aspirin; Ciprofloxacin; Clonidine derivatives; Nystatin; Oxycodone; Serevent [salmeterol]; and Cephalexin   Assessment / Plan:     Visit Diagnoses: Rheumatoid arthritis of multiple sites with negative rheumatoid factor  - Plan: CBC with Differential/Platelet, COMPLETE METABOLIC PANEL WITH GFR, QuantiFERON-TB Gold Plus  High risk medication use - Plan: CBC with Differential/Platelet, COMPLETE METABOLIC PANEL WITH GFR, QuantiFERON-TB Gold Plus  Acute pain of left shoulder  Fibromyalgia  Fatigue, unspecified type  Primary insomnia   Plan: 1.:  Rheumatoid arthritis (seronegative) Reports that she is doing well with her rheumatoid arthritis overall. She had poor response from Enbrel. She feels that the Humira and Arava combination is working well for her. She reports that she was unable to make a good fist formation before but now with the Humira and Arava, she is doing much better.  She is not able to do a full fist formation but she is 90% making a fist formation. She still struggles to hold things with her hands because of weakness in her hands. We will give the medication combination more time to work to see what full relief she is able to get.  In the meanwhile she will continue to size her hands to get the maximum functionality back. If necessary we can refer her to physical therapy for her hands.  2.:  Prescription Humira every other week Arava 20 mg daily Adequate response with this combination (had inadequate response to Enbrel and had side effects from methotrexate) Patient's last labs were June 2018. She is due for repeat labs today.  CBC with differential and CMP with GFR today; patient's last TB  Gold was October 2017 and she is due for repeat TB Gold today which we have ordered.  #3: Fibromyalgia syndrome. Active dz w/ generalized pain Patient rates her fibromyalgia discomfort between 4-5 on a scale of 0-10. She has been more active and doing Silver sneakers and walking with her walker at the mall. In the past she is to sleep a lot. She has changed that habit to being a little bit more active and that has definitely helped her fibromyalgia.  4.:  Insomnia and fatigue.  Ongoing but improved.  5.:  Patient will continue her exercise regimen as discussed.  6.:  Voltaren gel.  She is having pain to the left shoulder and the Voltaren gel will help. I will give her 3 tubes with 3 refills.  7: intentional weight loss via exercise and proper diet  ==>              today    194 lbs  ;  was 215 on Jan 25, 2017  Orders: Orders Placed This Encounter  Procedures  . CBC with Differential/Platelet  . COMPLETE METABOLIC PANEL WITH GFR  . QuantiFERON-TB Gold Plus   Meds ordered this encounter  Medications  . diclofenac sodium (VOLTAREN) 1 % GEL    Sig: Voltaren Gel 3 grams to 3 large joints upto TID 3 TUBES with 3 refills    Dispense:  3 Tube    Refill:  3    Order Specific Question:   Supervising Provider    Answer:   Bo Merino [[id number]]  . leflunomide (ARAVA) 10 MG tablet    Sig: Take 2 tablets (20 mg total) by mouth daily.    Dispense:  180 tablet    Refill:  0    Order Specific Question:   Supervising Provider    Answer:   Bo Merino [phone]    Face-to-face time spent with patient was 30 minutes. 50% of time was spent in counseling and coordination of care.  Follow-Up Instructions: Return in about 5 months (around 11/24/2017) for RA,humira q 2 wks, arava 1m qd, FMS, FATIGUE,INSOMNIA.   NEliezer Lofts PA-C  Note - This record has been created using [employer]  Chart creation errors have been sought, but may not always  have been located. Such creation  errors do not reflect on  the standard of medical care.

## 2017-06-28 LAB — QUANTIFERON TB GOLD ASSAY (BLOOD)
Mitogen-Nil: >10 IU/mL
QUANTIFERON(R)-TB GOLD: NEGATIVE
Quantiferon Nil Value: 0.05 IU/mL

## 2017-06-28 NOTE — Progress Notes (Signed)
TB gold negative

## 2017-07-02 MED FILL — HUMIRA PEN 40 MG/0.8ML PNKT: 40 | 28 days supply | Qty: 2 | Fill #0

## 2017-07-04 LAB — QUANTIFERON( R ) PL 1T

## 2017-07-10 NOTE — Progress Notes (Signed)
Labs are stable. TB gold pending

## 2017-07-24 ENCOUNTER — Other Ambulatory Visit: Payer: Self-pay | Admitting: Rheumatology

## 2017-07-25 NOTE — Telephone Encounter (Signed)
Last Visit: 06/26/17 Next Visit: 11/28/17 Labs: 06/26/17 stable TB Gold: 06/26/17 Neg   Okay to refill per Dr. Estanislado Pandy

## 2017-07-26 ENCOUNTER — Other Ambulatory Visit: Payer: Self-pay | Admitting: Rheumatology

## 2017-07-26 MED FILL — HUMIRA PEN 40 MG/0.8ML PNKT: 40 | 27 days supply | Qty: 2 | Fill #0

## 2017-07-26 NOTE — Telephone Encounter (Signed)
Last Visit: 06/26/17 Next Visit: 11/28/17  Okay to refill per Dr. Estanislado Pandy

## 2017-08-16 MED FILL — HUMIRA PEN 40 MG/0.8ML PNKT: 40 | 27 days supply | Qty: 2 | Fill #1

## 2017-08-31 ENCOUNTER — Telehealth: Payer: Self-pay

## 2017-08-31 NOTE — Telephone Encounter (Signed)
Received a confirmation from the pharmacy that pts prior authorization has expired. Authorization was submitted and approved via cover my meds from 08/31/2017 through 08/27/2018. Informed pharmacy.   [id number] Phone: [phone]  Will send document to scan center.  Luisangel Wainright, [place], CPhT 3:06 PM

## 2017-09-12 ENCOUNTER — Encounter: Payer: Self-pay | Admitting: Rheumatology

## 2017-09-12 ENCOUNTER — Encounter: Payer: Self-pay | Admitting: Allergy and Immunology

## 2017-09-12 MED FILL — HUMIRA PEN 40 MG/0.8ML PNKT: 40 | 27 days supply | Qty: 2 | Fill #2

## 2017-09-17 ENCOUNTER — Ambulatory Visit (INDEPENDENT_AMBULATORY_CARE_PROVIDER_SITE_OTHER): Payer: Medicare Other

## 2017-09-17 DIAGNOSIS — J4550 Severe persistent asthma, uncomplicated: Secondary | ICD-10-CM

## 2017-10-01 ENCOUNTER — Ambulatory Visit (INDEPENDENT_AMBULATORY_CARE_PROVIDER_SITE_OTHER): Payer: Medicare Other | Admitting: *Deleted

## 2017-10-01 DIAGNOSIS — J4550 Severe persistent asthma, uncomplicated: Secondary | ICD-10-CM | POA: Diagnosis not present

## 2017-10-03 ENCOUNTER — Other Ambulatory Visit: Payer: Self-pay | Admitting: *Deleted

## 2017-10-03 MED ORDER — OMALIZUMAB 150 MG ~~LOC~~ SOLR: 375.0000 mg | SUBCUTANEOUS | 11 refills | Status: DC

## 2017-10-15 ENCOUNTER — Ambulatory Visit: Payer: Medicare Other

## 2017-10-22 ENCOUNTER — Other Ambulatory Visit: Payer: Self-pay | Admitting: Rheumatology

## 2017-10-22 NOTE — Telephone Encounter (Addendum)
Last Visit: 06/26/17 Next Visit: 11/28/17 Labs: 06/26/17 Stable Tb Gold: 06/26/17 Neg   Left message to advise patient she is due to update labs.   Okay to refill 30 day supply per Dr. Estanislado Pandy

## 2017-10-23 MED FILL — HUMIRA PEN 40 MG/0.8ML PNKT: 40 | 28 days supply | Qty: 2 | Fill #0

## 2017-11-01 ENCOUNTER — Other Ambulatory Visit: Payer: Self-pay

## 2017-11-01 DIAGNOSIS — Z79899 Other long term (current) drug therapy: Secondary | ICD-10-CM

## 2017-11-01 LAB — COMPLETE METABOLIC PANEL WITHOUT GFR
AG Ratio: 1.3 (calc) (ref 1.0–2.5)
ALT: 11 U/L (ref 6–29)
AST: 12 U/L (ref 10–35)
Albumin: 3.6 g/dL (ref 3.6–5.1)
Alkaline phosphatase (APISO): 98 U/L (ref 33–130)
BUN/Creatinine Ratio: 11 (calc) (ref 6–22)
BUN: 12 mg/dL (ref 7–25)
CO2: 26 mmol/L (ref 20–32)
Calcium: 8.8 mg/dL (ref 8.6–10.4)
Chloride: 108 mmol/L (ref 98–110)
Creat: 1.07 mg/dL — ABNORMAL HIGH (ref 0.50–1.05)
GFR, Est African American: 69 mL/min/1.73m2
GFR, Est Non African American: 59 mL/min/1.73m2 — ABNORMAL LOW
Globulin: 2.7 g/dL (ref 1.9–3.7)
Glucose, Bld: 134 mg/dL — ABNORMAL HIGH (ref 65–99)
Potassium: 4.5 mmol/L (ref 3.5–5.3)
Sodium: 141 mmol/L (ref 135–146)
Total Bilirubin: 0.3 mg/dL (ref 0.2–1.2)
Total Protein: 6.3 g/dL (ref 6.1–8.1)

## 2017-11-01 LAB — CBC WITH DIFFERENTIAL/PLATELET
Basophils Absolute: 73 {cells}/uL (ref 0–200)
Basophils Relative: 1.1 %
Eosinophils Absolute: 211 {cells}/uL (ref 15–500)
Eosinophils Relative: 3.2 %
HCT: 38 % (ref 35.0–45.0)
Hemoglobin: 12.6 g/dL (ref 11.7–15.5)
Lymphs Abs: 2284 {cells}/uL (ref 850–3900)
MCH: 28.4 pg (ref 27.0–33.0)
MCHC: 33.2 g/dL (ref 32.0–36.0)
MCV: 85.8 fL (ref 80.0–100.0)
MPV: 10.6 fL (ref 7.5–12.5)
Monocytes Relative: 9.7 %
Neutro Abs: 3392 {cells}/uL (ref 1500–7800)
Neutrophils Relative %: 51.4 %
Platelets: 218 Thousand/uL (ref 140–400)
RBC: 4.43 Million/uL (ref 3.80–5.10)
RDW: 13.9 % (ref 11.0–15.0)
Total Lymphocyte: 34.6 %
WBC mixed population: 640 {cells}/uL (ref 200–950)
WBC: 6.6 Thousand/uL (ref 3.8–10.8)

## 2017-11-01 NOTE — Addendum Note (Signed)
Addended by: Nena [name] on: 11/01/2017 10:15 AM   Modules accepted: Orders

## 2017-11-10 ENCOUNTER — Encounter: Payer: Self-pay | Admitting: Allergy and Immunology

## 2017-11-15 DIAGNOSIS — R634 Abnormal weight loss: Secondary | ICD-10-CM

## 2017-11-15 HISTORY — DX: Abnormal weight loss: R63.4

## 2017-11-16 ENCOUNTER — Other Ambulatory Visit: Payer: Self-pay | Admitting: Rheumatology

## 2017-11-19 NOTE — Telephone Encounter (Signed)
Last Visit: 06/26/17 Next Visit: 11/28/17 Labs: 11/01/17 Glucose is elevated. CBC WNL. All other labs are stable. Tb Gold: 06/26/17 Neg   Okay to refill per Dr. Estanislado Pandy

## 2017-11-23 NOTE — Progress Notes (Deleted)
Office Visit Note  Patient: Kimberly Jenkins             Date of Birth: January 29, 1966           MRN: [id number]             PCP: Welford Roche, NP Referring: Welford Roche, NP Visit Date: 11/28/2017 Occupation: @GUAROCC @    Subjective:  No chief complaint on file.   History of Present Illness: Kimberly Jenkins is a 52 y.o. female ***   Activities of Daily Living:  Patient reports morning stiffness for *** {minute/hour:19697}.   Patient {ACTIONS;DENIES/REPORTS:21021675::"Denies"} nocturnal pain.  Difficulty dressing/grooming: {ACTIONS;DENIES/REPORTS:21021675::"Denies"} Difficulty climbing stairs: {ACTIONS;DENIES/REPORTS:21021675::"Denies"} Difficulty getting out of chair: {ACTIONS;DENIES/REPORTS:21021675::"Denies"} Difficulty using hands for taps, buttons, cutlery, and/or writing: {ACTIONS;DENIES/REPORTS:21021675::"Denies"}   No Rheumatology ROS completed.   PMFS History:  Patient Active Problem List   Diagnosis Date Noted  . Fibromyalgia 08/27/2016  . History of right rotator cuff tear repair 08/27/2016  . Diabetes 08/27/2016  . Chronic obstructive pulmonary disease ([place]) 08/27/2016  . Dyslipidemia 08/27/2016  . History of renal cell carcinoma 08/27/2016  . Anxiety 08/27/2016  . Sleep apnea 08/27/2016  . Rheumatoid arthritis of multiple sites with negative rheumatoid factor  07/18/2016  . High risk medication use 07/18/2016  . Hand pain 07/13/2016  . Foot pain 07/13/2016  . Elevated sed rate 07/13/2016  . Severe persistent asthma 04/30/2015  . Other allergic rhinitis 04/30/2015  . GERD (gastroesophageal reflux disease) 04/30/2015    Past Medical History:  Diagnosis Date  . Allergic rhinitis   . Asthma   . Cancer of kidney ([place])   . COPD (chronic obstructive pulmonary disease) ([place])   . Dehydration   . GERD (gastroesophageal reflux disease)   . Hypothyroidism   . Kidney disease   . Rheumatoid arthritis ([place])   . Sleep apnea     Family History    Problem Relation Age of Onset  . Brain cancer Brother   . Asthma Mother   . Lung cancer Mother   . Asthma Father   . Allergic rhinitis Father   . COPD Father   . Heart attack Father   . Brain cancer Paternal Uncle   . Asthma Maternal Grandmother   . Brain cancer Paternal Grandmother   . Heart attack Brother    Past Surgical History:  Procedure Laterality Date  . APPENDECTOMY    . CESAREAN SECTION     2 times  . CHOLECYSTECTOMY    . KIDNEY SURGERY    . SHOULDER SURGERY    . TONSILLECTOMY    . TOTAL SHOULDER ARTHROPLASTY    . TUBAL LIGATION     Social History   Social History Narrative  . Not on file     Objective: Vital Signs: There were no vitals taken for this visit.   Physical Exam   Musculoskeletal Exam: ***  CDAI Exam: No CDAI exam completed.    Investigation: No additional findings.TB Gold: 06/26/2017 Negative  CBC Latest Ref Rng & Units 11/01/2017 06/26/2017 02/01/2017  WBC 3.8 - 10.8 Thousand/uL 6.6 5.8 7.2  Hemoglobin 11.7 - 15.5 g/dL 12.6 13.2 14.7  Hematocrit 35.0 - 45.0 % 38.0 40.3 44.7  Platelets 140 - 400 Thousand/uL 218 234 265   CMP Latest Ref Rng & Units 11/01/2017 06/26/2017 02/01/2017  Glucose 65 - 99 mg/dL 134(H) 133(H) 78  BUN 7 - 25 mg/dL 12 12 13   Creatinine 0.50 - 1.05 mg/dL 1.07(H) 1.07(H) 1.19(H)  Sodium 135 - 146 mmol/L  141 142 141  Potassium 3.5 - 5.3 mmol/L 4.5 4.0 3.7  Chloride 98 - 110 mmol/L 108 107 105  CO2 20 - 32 mmol/L 26 26 22   Calcium 8.6 - 10.4 mg/dL 8.8 9.0 9.4  Total Protein 6.1 - 8.1 g/dL 6.3 7.0 7.7  Total Bilirubin 0.2 - 1.2 mg/dL 0.3 0.4 0.5  Alkaline Phos 33 - 130 U/L - - 119  AST 10 - 35 U/L 12 12 15   ALT 6 - 29 U/L 11 11 14     Imaging: No results found.  Speciality Comments: No specialty comments available.    Procedures:  No procedures performed Allergies: Methotrexate; Advair diskus [fluticasone-salmeterol]; Aspirin; Ciprofloxacin; Clonidine derivatives; Nystatin; Oxycodone; Serevent [salmeterol];  and Cephalexin   Assessment / Plan:     Visit Diagnoses: No diagnosis found.    Orders: No orders of the defined types were placed in this encounter.  No orders of the defined types were placed in this encounter.   Face-to-face time spent with patient was *** minutes. 50% of time was spent in counseling and coordination of care.  Follow-Up Instructions: No follow-ups on file.   Earnestine Mealing, CMA  Note - This record has been created using Editor, commissioning.  Chart creation errors have been sought, but may not always  have been located. Such creation errors do not reflect on  the standard of medical care.

## 2017-11-26 ENCOUNTER — Ambulatory Visit: Payer: Medicare Other | Admitting: Allergy and Immunology

## 2017-11-26 MED FILL — HUMIRA PEN 40 MG/0.8ML PNKT: 40 | 84 days supply | Qty: 6 | Fill #0

## 2017-11-28 ENCOUNTER — Ambulatory Visit: Payer: Medicare Other | Admitting: Rheumatology

## 2017-11-28 ENCOUNTER — Encounter: Payer: Self-pay | Admitting: Rheumatology

## 2017-12-03 ENCOUNTER — Ambulatory Visit (INDEPENDENT_AMBULATORY_CARE_PROVIDER_SITE_OTHER): Payer: Medicare Other | Admitting: Allergy and Immunology

## 2017-12-03 ENCOUNTER — Encounter: Payer: Self-pay | Admitting: Allergy and Immunology

## 2017-12-03 VITALS — BP 108/60 | HR 60 | Resp 18

## 2017-12-03 DIAGNOSIS — K219 Gastro-esophageal reflux disease without esophagitis: Secondary | ICD-10-CM | POA: Diagnosis not present

## 2017-12-03 DIAGNOSIS — J3089 Other allergic rhinitis: Secondary | ICD-10-CM

## 2017-12-03 DIAGNOSIS — K529 Noninfective gastroenteritis and colitis, unspecified: Secondary | ICD-10-CM | POA: Diagnosis not present

## 2017-12-03 DIAGNOSIS — J4550 Severe persistent asthma, uncomplicated: Secondary | ICD-10-CM | POA: Diagnosis not present

## 2017-12-03 MED ORDER — COLESTIPOL HCL 1 G PO TABS: ORAL_TABLET | ORAL | 5 refills | Status: DC

## 2017-12-03 NOTE — Patient Instructions (Addendum)
  1. Continue Breo as prescribed by Dr. Alcide Clever  2. Continue Omeprazole 40 one tablet two times per day + Pepcid in evening  3. Continue Montelukast (singulair) 10 one tablet one time per day  4. Continue Zyrtec 10 one tablet one time per day  6. Use Duoneb nebulization or Combivent respimat two puffs every 4-6 hours if needed.  7. Continue xolair and epi-pen  8. In evaluation of Diarrhea:   A. Stool - O&P, stool pathogens, C diff. Toxin and culture  B. Try colestipol 1gm tablet 1-2-3 times a day  8. Return in 6 months or earlier if problem.

## 2017-12-03 NOTE — Progress Notes (Signed)
Follow-up Note  Referring Provider: Welford Roche, NP Primary Provider: Welford Roche, NP Date of Office Visit: 12/03/2017  Subjective:   Kimberly Jenkins (DOB: June 26, 1966) is a 52 y.o. female who returns to the [facility] on 12/03/2017 in re-evaluation of the following:  HPI: Kimberly Jenkins returns to this clinic in reevaluation of several issues including severe asthma treated with omalizumab and allergic rhinitis and LPR.  Her last visit to this clinic was 30 May 2017.  Overall her respiratory tract status is about the same from her last visit.  It does not appear that she has required a systemic steroid or an antibiotic to treat a flareup of her airway.  She continues to use omalizumab on a regular basis and continues to use a combination inhaler.  Her requirement for a short acting bronchodilators is 1 or 2 times per day but rare use at nighttime.  She can exert herself without much difficulty.  Her reflux has been under excellent control.  She still continues to have issues with diarrhea.  When I last saw her in this clinic she was scheduled to have a colonoscopy performed by Dr. Lyda Jester.  She apparently has not had any samples of her stool collected to have testing in determination of the cause of her diarrhea and she has not really had any therapy for her diarrhea other than the suggestion that she increase her fiber content.  Allergies as of 12/03/2017      Reactions   Methotrexate Diarrhea   Advair Diskus [fluticasone-salmeterol]    Aspirin    Ciprofloxacin    Clonidine Derivatives    Nystatin    Oxycodone    Serevent [salmeterol]    Cephalexin Nausea And Vomiting      Medication List      BREO ELLIPTA IN Inhale 1 puff into the lungs daily.   busPIRone 15 MG tablet Commonly known as:  BUSPAR Take one tablet three times daily.   cetirizine 10 MG tablet Commonly known as:  ZYRTEC Take 10 mg by mouth daily.   clorazepate 3.75 MG  tablet Commonly known as:  TRANXENE Take one tablet four times a day as needed.   EASY TOUCH PEN NEEDLES 31G X 8 MM Misc Generic drug:  Insulin Pen Needle   EPIPEN 2-PAK 0.3 mg/0.3 mL Soaj injection Generic drug:  EPINEPHrine Inject 0.3 mg into the muscle once.   ezetimibe 10 MG tablet Commonly known as:  ZETIA Take 10 mg by mouth daily.   famotidine 20 MG tablet Commonly known as:  PEPCID Take 1 tablet by mouth at bedtime.   FLUoxetine 40 MG capsule Commonly known as:  PROZAC Take 40 mg by mouth daily.   furosemide 40 MG tablet Commonly known as:  LASIX Take 40 mg by mouth.   gabapentin 400 MG capsule Commonly known as:  NEURONTIN Take 600 mg by mouth 3 (three) times daily.   HUMIRA PEN 40 MG/0.8ML Pnkt Generic drug:  Adalimumab INJECT 1 PEN INTO THE SKIN EVERY 14 DAYS.   HYDROcodone-acetaminophen 10-325 MG tablet Commonly known as:  NORCO Take by mouth.   ipratropium-albuterol 0.5-2.5 (3) MG/3ML Soln Commonly known as:  DUONEB Take 3 mLs by nebulization every 4 (four) hours as needed.   COMBIVENT RESPIMAT 20-100 MCG/ACT Aers respimat Generic drug:  Ipratropium-Albuterol Inhale 1 puff into the lungs every 6 (six) hours as needed for wheezing.   Iron 325 (65 Fe) MG Tabs Take by mouth.   LANTUS SOLOSTAR 100  UNIT/ML Solostar Pen Generic drug:  Insulin Glargine 85 Units nightly.   leflunomide 10 MG tablet Commonly known as:  ARAVA Take 2 tablets (20 mg total) by mouth daily.   levothyroxine 50 MCG tablet Commonly known as:  SYNTHROID, LEVOTHROID TAKE 1 TABLET BY MOUTH EVERY DAY AT 6 AM   metFORMIN 500 MG tablet Commonly known as:  GLUCOPHAGE Take 500 mg by mouth 2 (two) times daily with a meal.   montelukast 10 MG tablet Commonly known as:  SINGULAIR Take 1 tablet (10 mg total) by mouth daily.   nystatin cream Commonly known as:  MYCOSTATIN   olopatadine 0.1 % ophthalmic solution Commonly known as:  PATANOL   omalizumab 150 MG  injection Commonly known as:  XOLAIR Inject 375 mg into the skin every 14 (fourteen) days.   omeprazole 40 MG capsule Commonly known as:  PRILOSEC Take 40 mg by mouth 2 (two) times daily.   potassium chloride SA 20 MEQ tablet Commonly known as:  K-DUR,KLOR-CON Take by mouth.   pramipexole 0.125 MG tablet Commonly known as:  MIRAPEX Take by mouth.   pravastatin 40 MG tablet Commonly known as:  PRAVACHOL Take 40 mg by mouth daily.   rizatriptan 10 MG tablet Commonly known as:  MAXALT Take 10 mg by mouth.   triamcinolone cream 0.1 % Commonly known as:  KENALOG   VICTOZA [place] Inject into the skin.   Vitamin D (Ergocalciferol) 50000 units Caps capsule Commonly known as:  DRISDOL Take by mouth every 7 (seven) days.       Past Medical History:  Diagnosis Date  . Allergic rhinitis   . Asthma   . Cancer of kidney ([place])   . COPD (chronic obstructive pulmonary disease) ([place])   . Dehydration   . GERD (gastroesophageal reflux disease)   . Hypothyroidism   . Kidney disease   . Rheumatoid arthritis ([place])   . Sleep apnea     Past Surgical History:  Procedure Laterality Date  . APPENDECTOMY    . CESAREAN SECTION     2 times  . CHOLECYSTECTOMY    . KIDNEY SURGERY    . SHOULDER SURGERY    . TONSILLECTOMY    . TOTAL SHOULDER ARTHROPLASTY    . TUBAL LIGATION      Review of systems negative except as noted in HPI / PMHx or noted below:  Review of Systems  Constitutional: Negative.   HENT: Negative.   Eyes: Negative.   Respiratory: Negative.   Cardiovascular: Negative.   Gastrointestinal: Negative.   Genitourinary: Negative.   Musculoskeletal: Negative.   Skin: Negative.   Neurological: Negative.   Endo/Heme/Allergies: Negative.   Psychiatric/Behavioral: Negative.      Objective:   Vitals:   12/03/17 0837  BP: 108/60  Pulse: 60  Resp: 18          Physical Exam  Constitutional: She is well-developed, well-nourished, and in no distress.  HENT:   Head: Normocephalic.  Right Ear: Tympanic membrane, external ear and ear canal normal.  Left Ear: Tympanic membrane, external ear and ear canal normal.  Nose: Nose normal. No mucosal edema or rhinorrhea.  Mouth/Throat: Uvula is midline, oropharynx is clear and moist and mucous membranes are normal. No oropharyngeal exudate.  Eyes: Conjunctivae are normal.  Neck: Trachea normal. No tracheal tenderness present. No tracheal deviation present. No thyromegaly present.  Cardiovascular: Normal rate, regular rhythm, S1 normal, S2 normal and normal heart sounds.  No murmur heard. Pulmonary/Chest: Breath sounds normal. No stridor.  No respiratory distress. She has no wheezes. She has no rales.  Musculoskeletal: She exhibits no edema.  Lymphadenopathy:       Head (right side): No tonsillar adenopathy present.       Head (left side): No tonsillar adenopathy present.    She has no cervical adenopathy.  Neurological: She is alert. Gait normal.  Skin: No rash noted. She is not diaphoretic. No erythema. Nails show no clubbing.  Psychiatric: Mood and affect normal.    Diagnostics:    Spirometry was performed and demonstrated an FEV1 of 1.73 at 66 % of predicted.  The patient had an Asthma Control Test with the following results: ACT Total Score: 17.    Assessment and Plan:   1. Asthma, severe persistent, well-controlled   2. Other allergic rhinitis   3. LPRD (laryngopharyngeal reflux disease)   4. Chronic diarrhea     1. Continue Breo as prescribed by Dr. Alcide Clever  2. Continue Omeprazole 40 one tablet two times per day + Pepcid in evening  3. Continue Montelukast (singulair) 10 one tablet one time per day  4. Continue Zyrtec 10 one tablet one time per day  6. Use Duoneb nebulization or Combivent respimat two puffs every 4-6 hours if needed.  7. Continue xolair and epi-pen  8. In evaluation of Diarrhea:   A. Stool - O&P, stool pathogens, C diff. Toxin and culture  B. Try colestipol 1gm  tablet 1-2-3 times a day  8. Return in 6 months or earlier if problem.  Kimberly Jenkins appears to be stable regarding her atopic respiratory disease and she will remain on a combination of omalizumab and anti-inflammatory agents for her respiratory tract and her reflux appears to be under good control as well and she will remain on a proton pump inhibitor and H2 receptor blocker.  Her diarrhea is still active and she may need to go see a different GI doctor regarding further evaluation of this issue.  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  Allena Katz, MD Allergy / Immunology [place]

## 2017-12-04 ENCOUNTER — Encounter: Payer: Self-pay | Admitting: Allergy and Immunology

## 2017-12-12 DIAGNOSIS — F320 Major depressive disorder, single episode, mild: Secondary | ICD-10-CM

## 2017-12-12 HISTORY — DX: Major depressive disorder, single episode, mild: F32.0

## 2017-12-17 ENCOUNTER — Ambulatory Visit (INDEPENDENT_AMBULATORY_CARE_PROVIDER_SITE_OTHER): Payer: Medicare Other | Admitting: *Deleted

## 2017-12-17 DIAGNOSIS — J4550 Severe persistent asthma, uncomplicated: Secondary | ICD-10-CM | POA: Diagnosis not present

## 2017-12-31 ENCOUNTER — Ambulatory Visit: Payer: Medicare Other

## 2018-01-01 ENCOUNTER — Ambulatory Visit (INDEPENDENT_AMBULATORY_CARE_PROVIDER_SITE_OTHER): Payer: Medicare Other | Admitting: *Deleted

## 2018-01-01 DIAGNOSIS — J4550 Severe persistent asthma, uncomplicated: Secondary | ICD-10-CM

## 2018-01-02 NOTE — Progress Notes (Signed)
Office Visit Note  Patient: Kimberly Jenkins             Date of Birth: [birth year]           MRN: [id number]             PCP: Welford Roche, NP Referring: Welford Roche, NP Visit Date: 01/07/2018 Occupation: @GUAROCC @    Subjective:  Left wrist pain    History of Present Illness: Kimberly Jenkins is a 51 y.o. female with history of seronegative rheumatoid arthritis and fibromyalgia.  Patient continues to inject Humira subcutaneously every other week and takes Arava 20 mg by mouth daily.  She feels that her rheumatoid arthritis has been a lot more well controlled since starting on Humira.  She states that she continues to have pain and swelling in her left wrist but has no pain in her right hand at this time.  She experiences knee pain when climbing steps.  She uses a walker when walking long distances. She states that her fibromyalgia has been flaring more frequently due to increased stress at home.  She states that she has been having increased fatigue and muscle tenderness and muscle aches.  She is currently having muscle tenderness on the left chest wall and left arm.  She denies any shortness of breath, jaw pain, nausea, or palpitations.  She states that she has had the symptoms in the past.  She states that she has been taking Xanax to help with her insomnia.  She reports that she has been walking on a regular basis.   Activities of Daily Living:  Patient reports morning stiffness for 20 minutes.   Patient Reports nocturnal pain.  Difficulty dressing/grooming: Reports Difficulty climbing stairs: Reports Difficulty getting out of chair: Reports Difficulty using hands for taps, buttons, cutlery, and/or writing: Reports   Review of Systems  Constitutional: Positive for fatigue.  HENT: Negative for mouth sores, trouble swallowing, trouble swallowing, mouth dryness and nose dryness.   Eyes: Negative for pain, visual disturbance and dryness.  Respiratory: Negative for cough,  hemoptysis, shortness of breath and difficulty breathing.   Cardiovascular: Negative for chest pain, palpitations, hypertension and swelling in legs/feet.  Gastrointestinal: Positive for constipation (Hx of IBS) and diarrhea. Negative for blood in stool.  Endocrine: Negative for increased urination.  Genitourinary: Positive for painful urination.  Musculoskeletal: Positive for arthralgias, joint pain, joint swelling, muscle weakness, morning stiffness and muscle tenderness. Negative for myalgias and myalgias.  Skin: Negative for color change, pallor, rash, hair loss, nodules/bumps, skin tightness, ulcers and sensitivity to sunlight.  Allergic/Immunologic: Negative for susceptible to infections.  Neurological: Negative for dizziness and headaches.  Hematological: Negative for swollen glands.  Psychiatric/Behavioral: Positive for sleep disturbance. Negative for depressed mood. The patient is not nervous/anxious.     PMFS History:  Patient Active Problem List   Diagnosis Date Noted  . Fibromyalgia 08/27/2016  . History of right rotator cuff tear repair 08/27/2016  . Diabetes 08/27/2016  . Chronic obstructive pulmonary disease ([place]) 08/27/2016  . Dyslipidemia 08/27/2016  . History of renal cell carcinoma 08/27/2016  . Anxiety 08/27/2016  . Sleep apnea 08/27/2016  . Rheumatoid arthritis of multiple sites with negative rheumatoid factor  07/18/2016  . High risk medication use 07/18/2016  . Hand pain 07/13/2016  . Foot pain 07/13/2016  . Elevated sed rate 07/13/2016  . Severe persistent asthma 04/30/2015  . Other allergic rhinitis 04/30/2015  . GERD (gastroesophageal reflux disease) 04/30/2015    Past Medical History:  Diagnosis Date  . Allergic rhinitis   . Asthma   . Cancer of kidney ([place])   . COPD (chronic obstructive pulmonary disease) ([place])   . Dehydration   . GERD (gastroesophageal reflux disease)   . Hypothyroidism   . Kidney disease   . Rheumatoid arthritis ([place])   .  Sleep apnea     Family History  Problem Relation Age of Onset  . Brain cancer Brother   . Asthma Mother   . Lung cancer Mother   . Asthma Father   . Allergic rhinitis Father   . COPD Father   . Heart attack Father   . Brain cancer Paternal Uncle   . Asthma Maternal Grandmother   . Brain cancer Paternal Grandmother   . Heart attack Brother    Past Surgical History:  Procedure Laterality Date  . APPENDECTOMY    . CESAREAN SECTION     2 times  . CHOLECYSTECTOMY    . KIDNEY SURGERY    . SHOULDER SURGERY    . TONSILLECTOMY    . TOTAL SHOULDER ARTHROPLASTY    . TUBAL LIGATION     Social History   Social History Narrative  . Not on file     Objective: Vital Signs: BP (!) 98/56 (BP Location: Left Arm, Patient Position: Sitting, Cuff Size: Normal)   Pulse 69   Resp 20   Ht 5' 2.5" (1.588 m)   Wt 192 lb (87.1 kg)   BMI 34.56 kg/m    Physical Exam  Constitutional: She is oriented to person, place, and time. She appears well-developed and well-nourished.  HENT:  Head: Normocephalic and atraumatic.  Eyes: Conjunctivae and EOM are normal.  Neck: Normal range of motion.  Cardiovascular: Normal rate, regular rhythm, normal heart sounds and intact distal pulses.  Pulmonary/Chest: Effort normal and breath sounds normal.  Abdominal: Soft. Bowel sounds are normal.  Lymphadenopathy:    She has no cervical adenopathy.  Neurological: She is alert and oriented to person, place, and time.  Skin: Skin is warm and dry. Capillary refill takes less than 2 seconds.  Psychiatric: She has a normal mood and affect. Her behavior is normal.  Nursing note and vitals reviewed.    Musculoskeletal Exam: Generalized hyperalgesia on exam. C-spine limited range of motion with lateral rotation.  Thoracic and lumbar spine good range of motion.  No midline spinal tenderness.  No SI joint tenderness.  Shoulder joints, elbow joints, wrist joints, MCPs, PIPs, DIPs good range of motion with no synovitis.   She has tenderness of the left wrist joint.  Hip joints, knee joints, ankle joints, MTPs, PIPs, DIPs good range of motion with no synovitis.  No warmth or effusion of bilateral knees.  She has tenderness of bilateral trochanteric bursa.  CDAI Exam: No CDAI exam completed.    Investigation: No additional findings.  CBC Latest Ref Rng & Units 11/01/2017 06/26/2017 02/01/2017  WBC 3.8 - 10.8 Thousand/uL 6.6 5.8 7.2  Hemoglobin 11.7 - 15.5 g/dL 12.6 13.2 14.7  Hematocrit 35.0 - 45.0 % 38.0 40.3 44.7  Platelets 140 - 400 Thousand/uL 218 234 265   CMP Latest Ref Rng & Units 11/01/2017 06/26/2017 02/01/2017  Glucose 65 - 99 mg/dL 134(H) 133(H) 78  BUN 7 - 25 mg/dL 12 12 13   Creatinine 0.50 - 1.05 mg/dL 1.07(H) 1.07(H) 1.19(H)  Sodium 135 - 146 mmol/L 141 142 141  Potassium 3.5 - 5.3 mmol/L 4.5 4.0 3.7  Chloride 98 - 110 mmol/L 108 107 105  CO2 20 - 32 mmol/L 26 26 22   Calcium 8.6 - 10.4 mg/dL 8.8 9.0 9.4  Total Protein 6.1 - 8.1 g/dL 6.3 7.0 7.7  Total Bilirubin 0.2 - 1.2 mg/dL 0.3 0.4 0.5  Alkaline Phos 33 - 130 U/L - - 119  AST 10 - 35 U/L 12 12 15   ALT 6 - 29 U/L 11 11 14    Imaging: No results found.  Speciality Comments: No specialty comments available.    Procedures:  No procedures performed Allergies: Methotrexate; Advair diskus [fluticasone-salmeterol]; Aspirin; Ciprofloxacin; Clonidine derivatives; Nystatin; Oxycodone; Serevent [salmeterol]; and Cephalexin   Assessment / Plan:     Visit Diagnoses: Rheumatoid arthritis of multiple sites with negative rheumatoid factor: She has no synovitis on exam today.  She is having discomfort in her left wrist at this time. She has not had any recent rheumatoid arthritis flares. She continues to inject Humira 40 mg subcutaneous every other week and takes Arava 20 mg by mouth daily.  She has noticed significant improvement since starting on Humira.  High risk medication use - Humira 40 mg subcutaneous every other week(11/02/2016), Arava 20 mg  by mouth daily (12/13/2016) (inadequate response to Enbrel, side effects from methotrexate).  CBC and CMP will be drawn today to monitor for drug toxicity.  She will return in August and every 3 months for lab work.- Plan: CBC with Differential/Platelet, COMPLETE METABOLIC PANEL WITH GFR  History of rotator cuff surgery: She has chronic pain in her bilateral shoulders.  She has good range of motion with discomfort on exam.  Fibromyalgia: She has generalized hyperalgesia on exam.  She has been having increased muscle aches and muscle tenderness.  She is having tenderness in her left chest wall as well as left arm.  She was advised to follow-up with her cardiologist today to rule out a cardiovascular origin of her chest wall pain.  She has had increased stress at home which has been causing her fibromyalgia to flare.  She has chronic fatigue and insomnia.  She is been taking Xanax to help her sleep at night.  She was encouraged to continue to exercise on a regular basis.  She has been walking for exercise.  Good sleep hygiene was discussed.  Other medical conditions are listed as follows:  History of renal cell carcinoma  Dyslipidemia  History of diabetes mellitus  History of hypothyroidism  History of gastroesophageal reflux (GERD)  Chronic obstructive pulmonary disease, unspecified COPD type ([place])  Dysuria -She has been experiencing symptoms of dysuria.  UA was checked today.  She was advised to follow-up with PCP regarding her urinary symptoms.  Plan: Urinalysis, Routine w reflex microscopic    Orders: Orders Placed This Encounter  Procedures  . CBC with Differential/Platelet  . COMPLETE METABOLIC PANEL WITH GFR  . Urinalysis, Routine w reflex microscopic   No orders of the defined types were placed in this encounter.   Face-to-face time spent with patient was 30 minutes.> 50% of time was spent in counseling and coordination of care.  Follow-Up Instructions: Return in about 5  months (around 06/09/2018) for Rheumatoid arthritis, Fibromyalgia.   Ofilia Neas, PA-C  Note - This record has been created using Dragon software.  Chart creation errors have been sought, but may not always  have been located. Such creation errors do not reflect on  the standard of medical care.

## 2018-01-07 ENCOUNTER — Ambulatory Visit (INDEPENDENT_AMBULATORY_CARE_PROVIDER_SITE_OTHER): Payer: Medicare Other | Admitting: Physician Assistant

## 2018-01-07 ENCOUNTER — Encounter: Payer: Self-pay | Admitting: Physician Assistant

## 2018-01-07 VITALS — BP 98/56 | HR 69 | Resp 20 | Ht 62.5 in | Wt 192.0 lb

## 2018-01-07 DIAGNOSIS — E785 Hyperlipidemia, unspecified: Secondary | ICD-10-CM

## 2018-01-07 DIAGNOSIS — Z85528 Personal history of other malignant neoplasm of kidney: Secondary | ICD-10-CM

## 2018-01-07 DIAGNOSIS — Z8719 Personal history of other diseases of the digestive system: Secondary | ICD-10-CM | POA: Diagnosis not present

## 2018-01-07 DIAGNOSIS — Z9889 Other specified postprocedural states: Secondary | ICD-10-CM

## 2018-01-07 DIAGNOSIS — Z8639 Personal history of other endocrine, nutritional and metabolic disease: Secondary | ICD-10-CM | POA: Diagnosis not present

## 2018-01-07 DIAGNOSIS — R300 Dysuria: Secondary | ICD-10-CM | POA: Diagnosis not present

## 2018-01-07 DIAGNOSIS — M797 Fibromyalgia: Secondary | ICD-10-CM | POA: Diagnosis not present

## 2018-01-07 DIAGNOSIS — M0609 Rheumatoid arthritis without rheumatoid factor, multiple sites: Secondary | ICD-10-CM

## 2018-01-07 DIAGNOSIS — Z79899 Other long term (current) drug therapy: Secondary | ICD-10-CM

## 2018-01-07 DIAGNOSIS — J449 Chronic obstructive pulmonary disease, unspecified: Secondary | ICD-10-CM

## 2018-01-08 LAB — URINALYSIS, ROUTINE W REFLEX MICROSCOPIC
BILIRUBIN URINE: NEGATIVE
Glucose, UA: NEGATIVE
HGB URINE DIPSTICK: NEGATIVE
Ketones, ur: NEGATIVE
Leukocytes, UA: NEGATIVE
Nitrite: NEGATIVE
PROTEIN: NEGATIVE
Specific Gravity, Urine: 1.01 (ref 1.001–1.03)
pH: <=5 (ref 5.0–8.0)

## 2018-01-08 LAB — COMPLETE METABOLIC PANEL WITH GFR
AG Ratio: 1.4 (calc) (ref 1.0–2.5)
ALKALINE PHOSPHATASE (APISO): 113 U/L (ref 33–130)
ALT: 17 U/L (ref 6–29)
AST: 17 U/L (ref 10–35)
Albumin: 4.1 g/dL (ref 3.6–5.1)
BUN/Creatinine Ratio: 10 (calc) (ref 6–22)
BUN: 11 mg/dL (ref 7–25)
CALCIUM: 9.1 mg/dL (ref 8.6–10.4)
CO2: 28 mmol/L (ref 20–32)
CREATININE: 1.06 mg/dL — AB (ref 0.50–1.05)
Chloride: 107 mmol/L (ref 98–110)
GFR, EST NON AFRICAN AMERICAN: 60 mL/min/{1.73_m2} (ref >=60)
GFR, Est African American: 69 mL/min/{1.73_m2} (ref >=60)
GLUCOSE: 113 mg/dL — AB (ref 65–99)
Globulin: 2.9 g/dL (calc) (ref 1.9–3.7)
Potassium: 3.9 mmol/L (ref 3.5–5.3)
SODIUM: 142 mmol/L (ref 135–146)
Total Bilirubin: 0.3 mg/dL (ref 0.2–1.2)
Total Protein: 7 g/dL (ref 6.1–8.1)

## 2018-01-08 LAB — CBC WITH DIFFERENTIAL/PLATELET
BASOS ABS: 99 {cells}/uL (ref 0–200)
Basophils Relative: 1.4 %
EOS PCT: 4 %
Eosinophils Absolute: 284 cells/uL (ref 15–500)
HEMATOCRIT: 39 % (ref 35.0–45.0)
Hemoglobin: 13.3 g/dL (ref 11.7–15.5)
LYMPHS ABS: 2002 {cells}/uL (ref 850–3900)
MCH: 29.5 pg (ref 27.0–33.0)
MCHC: 34.1 g/dL (ref 32.0–36.0)
MCV: 86.5 fL (ref 80.0–100.0)
MONOS PCT: 10.8 %
MPV: 10.6 fL (ref 7.5–12.5)
NEUTROS PCT: 55.6 %
Neutro Abs: 3948 cells/uL (ref 1500–7800)
PLATELETS: 243 10*3/uL (ref 140–400)
RBC: 4.51 10*6/uL (ref 3.80–5.10)
RDW: 13.6 % (ref 11.0–15.0)
Total Lymphocyte: 28.2 %
WBC mixed population: 767 cells/uL (ref 200–950)
WBC: 7.1 10*3/uL (ref 3.8–10.8)

## 2018-01-08 NOTE — Progress Notes (Signed)
CBC WNL. Glucose 113.  Creatinine stable. All other lab values are WNL.

## 2018-01-09 LAB — CDIFF NAA+O+P+STOOL CULTURE
E COLI SHIGA TOXIN ASSAY: NEGATIVE
Toxigenic C. Difficile by PCR: NEGATIVE

## 2018-01-09 LAB — SPECIMEN STATUS REPORT

## 2018-01-11 LAB — BACTERIAL STOOL PATHOGENS, NAA

## 2018-01-11 LAB — SPECIMEN STATUS REPORT

## 2018-01-15 ENCOUNTER — Ambulatory Visit (INDEPENDENT_AMBULATORY_CARE_PROVIDER_SITE_OTHER): Payer: Medicare Other | Admitting: *Deleted

## 2018-01-15 DIAGNOSIS — J4550 Severe persistent asthma, uncomplicated: Secondary | ICD-10-CM

## 2018-01-29 ENCOUNTER — Ambulatory Visit: Payer: Self-pay

## 2018-01-30 ENCOUNTER — Ambulatory Visit (INDEPENDENT_AMBULATORY_CARE_PROVIDER_SITE_OTHER): Payer: Medicare Other | Admitting: *Deleted

## 2018-01-30 DIAGNOSIS — J4550 Severe persistent asthma, uncomplicated: Secondary | ICD-10-CM | POA: Diagnosis not present

## 2018-02-11 ENCOUNTER — Other Ambulatory Visit: Payer: Self-pay | Admitting: Rheumatology

## 2018-02-11 NOTE — Telephone Encounter (Signed)
Last Visit: 01/07/18 Next Visit: 06/10/18 Labs: 01/07/18 CBC WNL. Glucose 113. Creatinine stable. All other lab values are WNL. TB Gold: 06/26/17 Neg   Okay to refill per Dr. Estanislado Pandy

## 2018-02-13 ENCOUNTER — Ambulatory Visit (INDEPENDENT_AMBULATORY_CARE_PROVIDER_SITE_OTHER): Payer: Medicare Other | Admitting: *Deleted

## 2018-02-13 DIAGNOSIS — J4550 Severe persistent asthma, uncomplicated: Secondary | ICD-10-CM

## 2018-02-18 MED FILL — HUMIRA PEN 40 MG/0.8ML PNKT: 40 | 84 days supply | Qty: 6 | Fill #0

## 2018-02-27 ENCOUNTER — Ambulatory Visit: Payer: Medicare Other

## 2018-03-14 ENCOUNTER — Ambulatory Visit (INDEPENDENT_AMBULATORY_CARE_PROVIDER_SITE_OTHER): Payer: Medicare Other | Admitting: *Deleted

## 2018-03-14 DIAGNOSIS — J4550 Severe persistent asthma, uncomplicated: Secondary | ICD-10-CM

## 2018-03-28 ENCOUNTER — Ambulatory Visit: Payer: Self-pay

## 2018-05-07 ENCOUNTER — Other Ambulatory Visit: Payer: Self-pay | Admitting: Rheumatology

## 2018-05-07 DIAGNOSIS — Z79899 Other long term (current) drug therapy: Secondary | ICD-10-CM

## 2018-05-07 NOTE — Telephone Encounter (Signed)
Last Visit: 01/07/18 Next Visit: 06/10/18 Labs: 01/07/18 CBC WNL. Glucose 113. Creatinine stable. All other lab values are WNL. TB Gold: 06/26/17 Neg   Patient advised she is due for labs and will update 05/08/18  Okay to refill per Dr. Estanislado Pandy

## 2018-05-08 ENCOUNTER — Other Ambulatory Visit: Payer: Self-pay | Admitting: *Deleted

## 2018-05-08 DIAGNOSIS — Z79899 Other long term (current) drug therapy: Secondary | ICD-10-CM

## 2018-05-09 LAB — CBC WITH DIFFERENTIAL/PLATELET
BASOS PCT: 1 %
Basophils Absolute: 58 cells/uL (ref 0–200)
EOS PCT: 4.5 %
Eosinophils Absolute: 261 cells/uL (ref 15–500)
HCT: 38.8 % (ref 35.0–45.0)
Hemoglobin: 12.9 g/dL (ref 11.7–15.5)
Lymphs Abs: 1427 cells/uL (ref 850–3900)
MCH: 28.7 pg (ref 27.0–33.0)
MCHC: 33.2 g/dL (ref 32.0–36.0)
MCV: 86.4 fL (ref 80.0–100.0)
MPV: 10.2 fL (ref 7.5–12.5)
Monocytes Relative: 10.9 %
NEUTROS PCT: 59 %
Neutro Abs: 3422 cells/uL (ref 1500–7800)
PLATELETS: 219 10*3/uL (ref 140–400)
RBC: 4.49 10*6/uL (ref 3.80–5.10)
RDW: 13.4 % (ref 11.0–15.0)
Total Lymphocyte: 24.6 %
WBC: 5.8 10*3/uL (ref 3.8–10.8)
WBCMIX: 632 {cells}/uL (ref 200–950)

## 2018-05-09 LAB — COMPLETE METABOLIC PANEL WITH GFR
AG Ratio: 1.4 (calc) (ref 1.0–2.5)
ALKALINE PHOSPHATASE (APISO): 101 U/L (ref 33–130)
ALT: 13 U/L (ref 6–29)
AST: 14 U/L (ref 10–35)
Albumin: 4 g/dL (ref 3.6–5.1)
BILIRUBIN TOTAL: 0.5 mg/dL (ref 0.2–1.2)
BUN / CREAT RATIO: 11 (calc) (ref 6–22)
BUN: 13 mg/dL (ref 7–25)
CHLORIDE: 107 mmol/L (ref 98–110)
CO2: 27 mmol/L (ref 20–32)
CREATININE: 1.19 mg/dL — AB (ref 0.50–1.05)
Calcium: 9.1 mg/dL (ref 8.6–10.4)
GFR, Est African American: 60 mL/min/{1.73_m2} (ref >=60)
GFR, Est Non African American: 52 mL/min/{1.73_m2} — ABNORMAL LOW (ref >=60)
GLUCOSE: 108 mg/dL — AB (ref 65–99)
Globulin: 2.8 g/dL (calc) (ref 1.9–3.7)
Potassium: 4.1 mmol/L (ref 3.5–5.3)
Sodium: 140 mmol/L (ref 135–146)
Total Protein: 6.8 g/dL (ref 6.1–8.1)

## 2018-05-09 NOTE — Progress Notes (Signed)
Creatinine is mildly elevated most likely due to use of diuretics.  Please forward labs to her PCP.

## 2018-05-20 MED FILL — HUMIRA PEN 40 MG/0.8ML PNKT: 40 | 28 days supply | Qty: 2 | Fill #0

## 2018-05-25 ENCOUNTER — Other Ambulatory Visit: Payer: Self-pay | Admitting: Allergy and Immunology

## 2018-05-27 NOTE — Progress Notes (Signed)
Office Visit Note  Patient: Kimberly Jenkins             Date of Birth: 03-27-1966           MRN: [id number]             PCP: Welford Roche, NP Referring: Welford Roche, NP Visit Date: 06/10/2018 Occupation: @GUAROCC @  Subjective:  Lower back pain   History of Present Illness: Kimberly Jenkins is a 52 y.o. female with history of seronegative rheumatoid arthritis and fibromyalgia.  Patient is on Humira 40 mg sq injections every other week and Arava 20 mg by mouth daily.  She denies any recent rheumatoid arthritis flares.  She denies any joint swelling at this time.  She reports significant improvement since being on Humira.  She states she has had increased stress at home.  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  She has been having increased lower back pain for the past 1 month.  She is stating that she has right-sided sciatica.  She states that she will be following up with her PCP soon.  She denies seeing a spine specialist.  She states the pain is so severe she is standing for prolonged periods of time.  She continues to have generalized muscle aches muscle tenderness due to fibromyalgia.  She is chronic fatigue and insomnia.  She states that she has discomfort in bilateral knee joints when she is going up and down steps.  She would like to have a ramp at home instead of steps to eliminate some of her knee pain   Activities of Daily Living:  Patient reports morning stiffness for 5-10   minutes.   Patient Reports nocturnal pain.  Difficulty dressing/grooming: Denies Difficulty climbing stairs: Reports Difficulty getting out of chair: Reports Difficulty using hands for taps, buttons, cutlery, and/or writing: Denies  Review of Systems  Constitutional: Positive for fatigue.  HENT: Positive for mouth dryness. Negative for mouth sores and nose dryness.   Eyes: Negative for pain, visual disturbance and dryness.  Respiratory: Negative for cough, hemoptysis, shortness of breath  and difficulty breathing.   Cardiovascular: Negative for chest pain, palpitations, hypertension and swelling in legs/feet.  Gastrointestinal: Positive for constipation and diarrhea (Hx of IBS). Negative for blood in stool.  Endocrine: Negative for increased urination.  Genitourinary: Negative for painful urination.  Musculoskeletal: Positive for arthralgias, joint pain, myalgias, morning stiffness, muscle tenderness and myalgias. Negative for joint swelling and muscle weakness.  Skin: Negative for color change, pallor, rash, hair loss, nodules/bumps, skin tightness, ulcers and sensitivity to sunlight.  Allergic/Immunologic: Negative for susceptible to infections.  Neurological: Negative for dizziness, numbness, headaches and weakness.  Hematological: Negative for swollen glands.  Psychiatric/Behavioral: Positive for sleep disturbance. Negative for depressed mood. The patient is not nervous/anxious.     PMFS History:  Patient Active Problem List   Diagnosis Date Noted  . Fibromyalgia 08/27/2016  . History of right rotator cuff tear repair 08/27/2016  . Diabetes 08/27/2016  . Chronic obstructive pulmonary disease ([place]) 08/27/2016  . Dyslipidemia 08/27/2016  . History of renal cell carcinoma 08/27/2016  . Anxiety 08/27/2016  . Sleep apnea 08/27/2016  . Rheumatoid arthritis of multiple sites with negative rheumatoid factor  07/18/2016  . High risk medication use 07/18/2016  . Hand pain 07/13/2016  . Foot pain 07/13/2016  . Elevated sed rate 07/13/2016  . Severe persistent asthma 04/30/2015  . Other allergic rhinitis 04/30/2015  . GERD (gastroesophageal reflux disease) 04/30/2015  Past Medical History:  Diagnosis Date  . Allergic rhinitis   . Asthma   . Cancer of kidney ([place])   . COPD (chronic obstructive pulmonary disease) ([place])   . Dehydration   . GERD (gastroesophageal reflux disease)   . Hypothyroidism   . Kidney disease   . Rheumatoid arthritis ([place])   . Sleep apnea       Family History  Problem Relation Age of Onset  . Brain cancer Brother   . Asthma Mother   . Lung cancer Mother   . Asthma Father   . Allergic rhinitis Father   . COPD Father   . Heart attack Father   . Brain cancer Paternal Uncle   . Asthma Maternal Grandmother   . Brain cancer Paternal Grandmother   . Heart attack Brother    Past Surgical History:  Procedure Laterality Date  . APPENDECTOMY    . CESAREAN SECTION     2 times  . CHOLECYSTECTOMY    . KIDNEY SURGERY    . SHOULDER SURGERY    . TONSILLECTOMY    . TOTAL SHOULDER ARTHROPLASTY    . TUBAL LIGATION     Social History   Social History Narrative  . Not on file    Objective: Vital Signs: BP 131/84 (BP Location: Left Arm, Patient Position: Sitting, Cuff Size: Normal)   Pulse 73   Resp 16   Ht 5' 2.5" (1.588 m)   Wt 185 lb (83.9 kg)   BMI 33.30 kg/m    Physical Exam  Constitutional: She is oriented to person, place, and time. She appears well-developed and well-nourished.  HENT:  Head: Normocephalic and atraumatic.  Eyes: Conjunctivae and EOM are normal.  Neck: Normal range of motion.  Cardiovascular: Normal rate, regular rhythm, normal heart sounds and intact distal pulses.  Pulmonary/Chest: Effort normal and breath sounds normal.  Abdominal: Soft. Bowel sounds are normal.  Lymphadenopathy:    She has no cervical adenopathy.  Neurological: She is alert and oriented to person, place, and time.  Skin: Skin is warm and dry. Capillary refill takes less than 2 seconds.  Psychiatric: She has a normal mood and affect. Her behavior is normal.  Nursing note and vitals reviewed.    Musculoskeletal Exam: She has generalized hyperalgesia on exam.  C-spine limited range of motion.  Thoracic and lumbar spine limited range of motion with discomfort.  Shoulder joints, elbow joints, wrist joints, MCPs, PIPs, DIPs good range of motion with no synovitis.  She has complete fist formation bilaterally.  Hip joints, knee  joints, ankle joints, MTPs, PIPs, DIPs good range of motion with no synovitis.  No warmth or effusion bilateral knee joints.  Tenderness of bilateral trochanteric bursa.  CDAI Exam: CDAI Score: 0.8  Patient Global Assessment: 4 (mm); Provider Global Assessment: 4 (mm) Swollen: 0 ; Tender: 0  Joint Exam   Not documented   There is currently no information documented on the homunculus. Go to the Rheumatology activity and complete the homunculus joint exam.  Investigation: No additional findings.  Imaging: No results found.  Recent Labs: Lab Results  Component Value Date   WBC 5.8 05/08/2018   HGB 12.9 05/08/2018   PLT 219 05/08/2018   NA 140 05/08/2018   K 4.1 05/08/2018   CL 107 05/08/2018   CO2 27 05/08/2018   GLUCOSE 108 (H) 05/08/2018   BUN 13 05/08/2018   CREATININE 1.19 (H) 05/08/2018   BILITOT 0.5 05/08/2018   ALKPHOS 119 02/01/2017   AST  14 05/08/2018   ALT 13 05/08/2018   PROT 6.8 05/08/2018   ALBUMIN 4.3 02/01/2017   CALCIUM 9.1 05/08/2018   GFRAA 60 05/08/2018   QFTBGOLD NEGATIVE 06/26/2017   QFTBGOLDPLUS CANCELED 06/26/2017    Speciality Comments: No specialty comments available.  Procedures:  No procedures performed Allergies: Methotrexate; Advair diskus [fluticasone-salmeterol]; Aspirin; Ciprofloxacin; Clonidine derivatives; Nystatin; Oxycodone; Serevent [salmeterol]; and Cephalexin   Assessment / Plan:     Visit Diagnoses: Rheumatoid arthritis of multiple sites with negative rheumatoid factor: She has no synovitis on exam.  Her joint pain and morning stiffness has improved significantly since being on Humira 40 mg subcutaneous days injections every other week and Arava 20 mg by mouth daily.  She is clinically doing well on this combination of medications.  She has not had any recent rheumatoid arthritis flares.  She will continue injecting Humira 40 mg subcutaneously every other week and taking Areva 20 mg by mouth daily.  She does not need any refills  at this time.  She was advised to notify us if she develops increased joint pain or joint swelling.  She will follow-up in the office in 5 months.  High risk medication use - Humira 40 mg subcutaneous every other week(11/02/2016), Arava 20 mg by mouth daily (12/13/2016) (inadequate response to Enbrel, side effects from methotrexate).  CBC and CMP were drawn on 05/08/2018.  TB gold was negative on 06/26/2018.  She declined ordering TB gold test today and would like to have added with her next lab work in December.  Future order for TB gold was placed today.  She will return for labs in December and every 3 months.- Plan: QuantiFERON-TB Gold Plus  Fibromyalgia: She has generalized hyperalgesia on exam.  She continues to have generalized muscle aches and muscle tenderness.  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  She continues to have chronic insomnia and fatigue.  She has not been sleeping well at night due to her lower back pain.  She was encouraged to try to stay active motion is on a regular basis.  Good sleep hygiene was discussed.  History of rotator cuff surgery: She is good range of motion of bilateral shoulders.  She has no discomfort at this time.  Other medical conditions are listed as follows:   History of renal cell carcinoma  Dyslipidemia  History of diabetes mellitus  History of hypothyroidism  History of gastroesophageal reflux (GERD)  Chronic obstructive pulmonary disease, unspecified COPD type ([place])  Primary insomnia: chronic   Orders: Orders Placed This Encounter  Procedures  . QuantiFERON-TB Gold Plus   No orders of the defined types were placed in this encounter.   Face-to-face time spent with patient was 30 minutes. Greater than 50% of time was spent in counseling and coordination of care.  Follow-Up Instructions: Return in about 5 months (around 11/09/2018) for  Rheumatoid arthritis, Fibromyalgia.   Ofilia Neas, PA-C  Note - This record has been created using Dragon software.  Chart creation errors have been sought, but may not always  have been located. Such creation errors do not reflect on  the standard of medical care.

## 2018-06-07 ENCOUNTER — Encounter: Payer: Self-pay | Admitting: Allergy and Immunology

## 2018-06-10 ENCOUNTER — Ambulatory Visit (INDEPENDENT_AMBULATORY_CARE_PROVIDER_SITE_OTHER): Payer: Medicare Other | Admitting: *Deleted

## 2018-06-10 ENCOUNTER — Encounter: Payer: Self-pay | Admitting: Physician Assistant

## 2018-06-10 ENCOUNTER — Ambulatory Visit (INDEPENDENT_AMBULATORY_CARE_PROVIDER_SITE_OTHER): Payer: Medicare Other | Admitting: Physician Assistant

## 2018-06-10 ENCOUNTER — Ambulatory Visit: Payer: Medicare Other | Admitting: Rheumatology

## 2018-06-10 VITALS — BP 131/84 | HR 73 | Resp 16 | Ht 62.5 in | Wt 185.0 lb

## 2018-06-10 DIAGNOSIS — Z79899 Other long term (current) drug therapy: Secondary | ICD-10-CM | POA: Diagnosis not present

## 2018-06-10 DIAGNOSIS — Z8719 Personal history of other diseases of the digestive system: Secondary | ICD-10-CM

## 2018-06-10 DIAGNOSIS — Z9889 Other specified postprocedural states: Secondary | ICD-10-CM

## 2018-06-10 DIAGNOSIS — E785 Hyperlipidemia, unspecified: Secondary | ICD-10-CM

## 2018-06-10 DIAGNOSIS — Z85528 Personal history of other malignant neoplasm of kidney: Secondary | ICD-10-CM

## 2018-06-10 DIAGNOSIS — J449 Chronic obstructive pulmonary disease, unspecified: Secondary | ICD-10-CM

## 2018-06-10 DIAGNOSIS — J4550 Severe persistent asthma, uncomplicated: Secondary | ICD-10-CM

## 2018-06-10 DIAGNOSIS — M797 Fibromyalgia: Secondary | ICD-10-CM

## 2018-06-10 DIAGNOSIS — Z8639 Personal history of other endocrine, nutritional and metabolic disease: Secondary | ICD-10-CM

## 2018-06-10 DIAGNOSIS — F5101 Primary insomnia: Secondary | ICD-10-CM

## 2018-06-10 DIAGNOSIS — M0609 Rheumatoid arthritis without rheumatoid factor, multiple sites: Secondary | ICD-10-CM

## 2018-06-10 NOTE — Patient Instructions (Signed)
Standing Labs We placed an order today for your standing lab work.    Please come back and get your standing labs in December and every 3 months   We have open lab Monday through Friday from 8:30-11:30 AM and 1:30-4:00 PM  at the office of Dr. Shaili Deveshwar.   You may experience shorter wait times on Monday and Friday afternoons. The office is located at [address], [address], [place], [place] [address] No appointment is necessary.   Labs are drawn by [facility].  You may receive a bill from [facility] for your lab work. If you have any questions regarding directions or hours of operation,  please call [phone].   Just as a reminder please drink plenty of water prior to coming for your lab work. Thanks!   

## 2018-06-11 ENCOUNTER — Other Ambulatory Visit: Payer: Self-pay | Admitting: Rheumatology

## 2018-06-11 NOTE — Telephone Encounter (Signed)
Last Visit: 06/10/18 Next Visit: 11/11/18 Labs: 05/08/18 Creatinine is mildly elevated most likely due to use of diuretics.  TB Gold: 06/26/17 Neg (updated 06/10/18)  Okay to refill per Dr. Estanislado Pandy

## 2018-06-13 MED FILL — HUMIRA PEN 40 MG/0.8ML PNKT: 40 | 84 days supply | Qty: 6 | Fill #0

## 2018-06-24 ENCOUNTER — Ambulatory Visit (INDEPENDENT_AMBULATORY_CARE_PROVIDER_SITE_OTHER): Payer: Medicare Other | Admitting: *Deleted

## 2018-06-24 DIAGNOSIS — J4550 Severe persistent asthma, uncomplicated: Secondary | ICD-10-CM | POA: Diagnosis not present

## 2018-07-08 ENCOUNTER — Ambulatory Visit: Payer: Self-pay

## 2018-07-15 ENCOUNTER — Ambulatory Visit (INDEPENDENT_AMBULATORY_CARE_PROVIDER_SITE_OTHER): Payer: Medicare Other | Admitting: *Deleted

## 2018-07-15 ENCOUNTER — Telehealth (INDEPENDENT_AMBULATORY_CARE_PROVIDER_SITE_OTHER): Payer: Self-pay | Admitting: *Deleted

## 2018-07-15 DIAGNOSIS — J4550 Severe persistent asthma, uncomplicated: Secondary | ICD-10-CM

## 2018-07-15 NOTE — Telephone Encounter (Signed)
I received fax from [facility] advising pt has appt scheduled with Dr. Willene Hatchet on 07/22/18, they are requesting any recent lab work/office notes/diagnostics performed since 03/22/18.   If patient has not had any recent labs, no response is needed.   Please fax to [phone].

## 2018-07-15 NOTE — Telephone Encounter (Signed)
Attempted to contact the patient and left message for patient to call the office.  

## 2018-07-17 NOTE — Telephone Encounter (Signed)
Patient advised she will need to come office to sign release of records. Patient will come 07/18/18 to sign.

## 2018-07-24 ENCOUNTER — Other Ambulatory Visit: Payer: Self-pay | Admitting: Rheumatology

## 2018-07-24 NOTE — Telephone Encounter (Signed)
Last Visit: 06/10/18 Next Visit: 11/11/18  Okay to refill per Dr. Estanislado Pandy

## 2018-07-29 ENCOUNTER — Other Ambulatory Visit: Payer: Self-pay | Admitting: *Deleted

## 2018-07-29 ENCOUNTER — Ambulatory Visit: Payer: Self-pay

## 2018-07-29 MED ORDER — IPRATROPIUM-ALBUTEROL 0.5-2.5 (3) MG/3ML IN SOLN: RESPIRATORY_TRACT | 0 refills | Status: DC

## 2018-07-31 ENCOUNTER — Ambulatory Visit (INDEPENDENT_AMBULATORY_CARE_PROVIDER_SITE_OTHER): Payer: Medicare Other | Admitting: *Deleted

## 2018-07-31 DIAGNOSIS — J4550 Severe persistent asthma, uncomplicated: Secondary | ICD-10-CM

## 2018-08-01 ENCOUNTER — Telehealth: Payer: Self-pay | Admitting: Pharmacy Technician

## 2018-08-01 NOTE — Telephone Encounter (Signed)
Received a fax from [employer] regarding a prior authorization for Humira. Authorization has been APPROVED from 08/28/2018 to 08/28/2019.   Will send document to scan center.  Authorization # [id number] Phone # [phone]  2:08 PM Beatriz Chancellor, CPhT

## 2018-08-01 NOTE — Telephone Encounter (Signed)
Received a Prior Authorization request from [employer] for Humira. Authorization has been submitted to patient's insurance via Cover My Meds. Will update once we receive a response.  11:18 AM Beatriz Chancellor, CPhT

## 2018-08-14 ENCOUNTER — Ambulatory Visit: Payer: Medicare Other

## 2018-08-14 ENCOUNTER — Encounter: Payer: Self-pay | Admitting: Allergy and Immunology

## 2018-08-29 ENCOUNTER — Other Ambulatory Visit: Payer: Self-pay | Admitting: Allergy and Immunology

## 2018-08-30 ENCOUNTER — Other Ambulatory Visit: Payer: Self-pay | Admitting: Rheumatology

## 2018-08-30 NOTE — Telephone Encounter (Signed)
Last Visit: 06/10/18 Next Visit: 11/11/18 Labs: 05/08/18 Creatinine is mildly elevated most likely due to use of diuretics TB Gold: 06/26/17 Neg   Attempted to contact the patient and left message to advise patient she is due to update labs.   Okay to refill 30 day supply Humira?

## 2018-08-30 NOTE — Telephone Encounter (Signed)
Patient needs CBC, CMP, and TB gold prior to refilling Humira.

## 2018-09-02 ENCOUNTER — Ambulatory Visit (INDEPENDENT_AMBULATORY_CARE_PROVIDER_SITE_OTHER): Payer: Medicare Other | Admitting: *Deleted

## 2018-09-02 ENCOUNTER — Encounter: Payer: Self-pay | Admitting: Allergy and Immunology

## 2018-09-02 DIAGNOSIS — J4550 Severe persistent asthma, uncomplicated: Secondary | ICD-10-CM

## 2018-09-12 ENCOUNTER — Ambulatory Visit (INDEPENDENT_AMBULATORY_CARE_PROVIDER_SITE_OTHER): Payer: Medicare Other | Admitting: Allergy and Immunology

## 2018-09-12 ENCOUNTER — Encounter: Payer: Self-pay | Admitting: Allergy and Immunology

## 2018-09-12 ENCOUNTER — Ambulatory Visit: Payer: Medicare Other | Admitting: Allergy and Immunology

## 2018-09-12 VITALS — BP 104/68 | HR 78 | Resp 16 | Ht 61.4 in | Wt 194.8 lb

## 2018-09-12 DIAGNOSIS — K219 Gastro-esophageal reflux disease without esophagitis: Secondary | ICD-10-CM | POA: Diagnosis not present

## 2018-09-12 DIAGNOSIS — J3089 Other allergic rhinitis: Secondary | ICD-10-CM | POA: Diagnosis not present

## 2018-09-12 DIAGNOSIS — J4550 Severe persistent asthma, uncomplicated: Secondary | ICD-10-CM

## 2018-09-12 MED ORDER — EPINEPHRINE 0.3 MG/0.3ML IJ SOAJ: INTRAMUSCULAR | 3 refills | Status: DC

## 2018-09-12 MED ORDER — IPRATROPIUM-ALBUTEROL 0.5-2.5 (3) MG/3ML IN SOLN: RESPIRATORY_TRACT | 1 refills | Status: DC

## 2018-09-12 NOTE — Progress Notes (Signed)
Follow-up Note  Referring Provider: Welford Roche, NP Primary Provider: Welford Roche, NP Date of Office Visit: 09/12/2018  Subjective:   Kimberly Jenkins (DOB: 02-Sep-1965) is a 53 y.o. female who returns to the [facility] [place] on 09/12/2018 in re-evaluation of the following:  HPI: Kimberly Jenkins returns to this clinic in reevaluation of severe asthma treated with omalizumab, allergic rhinitis, and LPR.  She was last seen in this clinic on 04 April 2018.  She continues on omalizumab injections without any difficulty.  Overall she thinks that her airway is doing relatively well but she does get dyspneic on exertion.  As well, when she wakes up in the morning and she takes her CPAP machine off she has a cough and phlegm and she gags and commonly vomits.  She has been stuck in this pattern for years.  No therapy administered in the past has really helped this issue.  She sees a local pulmonologist who has her treated with a combination of Breo and Spiriva.  The local pulmonologist is also treating her sleep apnea with a CPAP machine.  She has had very little issues with her nose at this point.  Apparently she did have a "cold" once or twice since I have seen her in this clinic.  She thinks that her reflux is under very good control at this point.  When I last saw her in this clinic she was having an issue with diarrhea which fortunately has resolved.  She did obtain the flu vaccine.  She continues on Humira for her rheumatoid arthritis.  Allergies as of 09/12/2018      Reactions   Methotrexate Diarrhea   Advair Diskus [fluticasone-salmeterol]    Aspirin    Ciprofloxacin    Clonidine Derivatives    Nystatin    Oxycodone    Serevent [salmeterol]    Cephalexin Nausea And Vomiting      Medication List      ADIPEX-P 37.5 MG tablet Generic drug:  phentermine Take 37.5 mg by mouth daily before breakfast.   BREO ELLIPTA IN Inhale 1 puff into the lungs daily.   busPIRone  15 MG tablet Commonly known as:  BUSPAR Take one tablet three times daily.   cetirizine 10 MG tablet Commonly known as:  ZYRTEC Take 10 mg by mouth daily.   clorazepate 3.75 MG tablet Commonly known as:  TRANXENE Take one tablet four times a day as needed.   COMBIVENT RESPIMAT 20-100 MCG/ACT Aers respimat Generic drug:  Ipratropium-Albuterol Inhale 1 puff into the lungs every 6 (six) hours as needed for wheezing.   ipratropium-albuterol 0.5-2.5 (3) MG/3ML Soln Commonly known as:  DUONEB Use one vial in the nebulizer every 4-6 hours if needed for cough or wheeze   EASY TOUCH PEN NEEDLES 31G X 8 MM Misc Generic drug:  Insulin Pen Needle   EPIPEN 2-PAK 0.3 mg/0.3 mL Soaj injection Generic drug:  EPINEPHrine Inject 0.3 mg into the muscle once.   ezetimibe 10 MG tablet Commonly known as:  ZETIA Take 10 mg by mouth daily.   famotidine 20 MG tablet Commonly known as:  PEPCID Take 1 tablet by mouth at bedtime.   FLUoxetine 40 MG capsule Commonly known as:  PROZAC Take 40 mg by mouth daily.   folic acid 1 MG tablet Commonly known as:  FOLVITE TAKE 2 TABLETS BY MOUTH EVERY DAY   furosemide 40 MG tablet Commonly known as:  LASIX Take 20 mg by mouth every other day.  gabapentin 400 MG capsule Commonly known as:  NEURONTIN Take 600 mg by mouth 3 (three) times daily.   HUMIRA PEN 40 MG/0.8ML Pnkt Generic drug:  Adalimumab INJECT 1 PEN INTO THE SKIN EVERY 14 DAYS.   HUMIRA PEN 40 MG/0.8ML Pnkt Generic drug:  Adalimumab INJECT 1 PEN INTO THE SKIN EVERY 14 DAYS.   HYDROcodone-acetaminophen 10-325 MG tablet Commonly known as:  NORCO Take by mouth.   LANTUS SOLOSTAR 100 UNIT/ML Solostar Pen Generic drug:  Insulin Glargine as needed.   leflunomide 10 MG tablet Commonly known as:  ARAVA Take 2 tablets (20 mg total) by mouth daily.   levothyroxine 50 MCG tablet Commonly known as:  SYNTHROID, LEVOTHROID TAKE 1 TABLET BY MOUTH EVERY DAY AT 6 AM   metFORMIN 500 MG  tablet Commonly known as:  GLUCOPHAGE Take 500 mg by mouth 2 (two) times daily with a meal.   montelukast 10 MG tablet Commonly known as:  SINGULAIR Take 1 tablet (10 mg total) by mouth daily.   nystatin cream Commonly known as:  MYCOSTATIN as needed.   olopatadine 0.1 % ophthalmic solution Commonly known as:  PATANOL   omalizumab 150 MG injection Commonly known as:  XOLAIR Inject 375 mg into the skin every 14 (fourteen) days.   omeprazole 40 MG capsule Commonly known as:  PRILOSEC Take 40 mg by mouth 2 (two) times daily.   potassium chloride SA 20 MEQ tablet Commonly known as:  K-DUR,KLOR-CON Take by mouth.   pramipexole 0.125 MG tablet Commonly known as:  MIRAPEX Take by mouth.   pravastatin 40 MG tablet Commonly known as:  PRAVACHOL Take 40 mg by mouth daily.   rizatriptan 10 MG tablet Commonly known as:  MAXALT Take 10 mg by mouth.   traZODone 50 MG tablet Commonly known as:  DESYREL Take 50 mg by mouth at bedtime.   triamcinolone cream 0.1 % Commonly known as:  KENALOG   VICTOZA [place] Inject into the skin.   Vitamin D (Ergocalciferol) 1.25 MG (50000 UT) Caps capsule Commonly known as:  DRISDOL Take by mouth every 7 (seven) days.       Past Medical History:  Diagnosis Date  . Allergic rhinitis   . Asthma   . Cancer of kidney ([place])   . COPD (chronic obstructive pulmonary disease) ([place])   . Dehydration   . GERD (gastroesophageal reflux disease)   . Hypothyroidism   . Kidney disease   . Rheumatoid arthritis ([place])   . Sleep apnea   . Tremor     Past Surgical History:  Procedure Laterality Date  . APPENDECTOMY    . CESAREAN SECTION     2 times  . CHOLECYSTECTOMY    . KIDNEY SURGERY    . SHOULDER SURGERY    . TONSILLECTOMY    . TOTAL SHOULDER ARTHROPLASTY    . TUBAL LIGATION      Review of systems negative except as noted in HPI / PMHx or noted below:  Review of Systems  Constitutional: Negative.   HENT: Negative.   Eyes: Negative.     Respiratory: Negative.   Cardiovascular: Negative.   Gastrointestinal: Negative.   Genitourinary: Negative.   Musculoskeletal: Negative.   Skin: Negative.   Neurological: Negative.   Endo/Heme/Allergies: Negative.   Psychiatric/Behavioral: Negative.      Objective:   Vitals:   09/12/18 1408  BP: 104/68  Pulse: 78  Resp: 16   Height: 5' 1.4" (156 cm)  Weight: 194 lb 12.8 oz (88.4 kg)  Physical Exam Constitutional:      Appearance: She is not diaphoretic.  HENT:     Head: Normocephalic.     Right Ear: Tympanic membrane, ear canal and external ear normal.     Left Ear: Tympanic membrane, ear canal and external ear normal.     Nose: Nose normal. No mucosal edema or rhinorrhea.     Mouth/Throat:     Pharynx: Uvula midline. No oropharyngeal exudate.  Eyes:     Conjunctiva/sclera: Conjunctivae normal.  Neck:     Thyroid: No thyromegaly.     Trachea: Trachea normal. No tracheal tenderness or tracheal deviation.  Cardiovascular:     Rate and Rhythm: Normal rate and regular rhythm.     Heart sounds: Normal heart sounds, S1 normal and S2 normal. No murmur.  Pulmonary:     Effort: No respiratory distress.     Breath sounds: Normal breath sounds. No stridor. No wheezing or rales.  Lymphadenopathy:     Head:     Right side of head: No tonsillar adenopathy.     Left side of head: No tonsillar adenopathy.     Cervical: No cervical adenopathy.  Skin:    Findings: No erythema or rash.     Nails: There is no clubbing.   Neurological:     Mental Status: She is alert.     Diagnostics:    Spirometry was performed and demonstrated an FEV1 of 1.70 at 70 % of predicted.  Assessment and Plan:   1. Asthma, severe persistent, well-controlled   2. Other allergic rhinitis   3. LPRD (laryngopharyngeal reflux disease)     1. Continue Breo and Spiriva as prescribed by Dr. Alcide Clever  2. Continue Omeprazole 40 one tablet two times per day + Pepcid in evening  3. Continue  Montelukast (singulair) 10 one tablet one time per day  4. Continue Zyrtec 10 one tablet one time per day  6. Use Duoneb nebulization or Combivent respimat two puffs every 4-6 hours if needed.  7. Continue xolair and epi-pen  8. Return in 6 months or earlier if problem.  Kimberly Jenkins appears to be stable on her current medical plan directed against respiratory tract inflammation and reflux.  However, she still has an issue with having a cough and phlegm production and occasional posttussive emesis in the morning.  I mentioned that she can follow-up with her pulmonologist regarding further evaluation and treatment of this issue.  She may also need to see her gastroenterologist if she still continues to have issues with emesis.  I will see her back in this clinic in 6 months or earlier if there is a problem.  Allena Katz, MD Allergy / Immunology [place]

## 2018-09-12 NOTE — Patient Instructions (Addendum)
  1. Continue Breo and Spiriva as prescribed by Dr. Alcide Clever  2. Continue Omeprazole 40 one tablet two times per day + Pepcid in evening  3. Continue Montelukast (singulair) 10 one tablet one time per day  4. Continue Zyrtec 10 one tablet one time per day  6. Use Duoneb nebulization or Combivent respimat two puffs every 4-6 hours if needed.  7. Continue xolair and epi-pen  8. Return in 6 months or earlier if problem.

## 2018-09-16 ENCOUNTER — Encounter: Payer: Self-pay | Admitting: Allergy and Immunology

## 2018-09-16 ENCOUNTER — Ambulatory Visit (INDEPENDENT_AMBULATORY_CARE_PROVIDER_SITE_OTHER): Payer: Medicare Other | Admitting: *Deleted

## 2018-09-16 DIAGNOSIS — J4550 Severe persistent asthma, uncomplicated: Secondary | ICD-10-CM

## 2018-09-23 ENCOUNTER — Other Ambulatory Visit: Payer: Self-pay

## 2018-09-23 ENCOUNTER — Telehealth: Payer: Self-pay | Admitting: Rheumatology

## 2018-09-23 DIAGNOSIS — Z79899 Other long term (current) drug therapy: Secondary | ICD-10-CM

## 2018-09-23 NOTE — Telephone Encounter (Signed)
Patient advised that we would send prescription to the pharmacy once her labs results. Patient verbalized understanding.

## 2018-09-23 NOTE — Telephone Encounter (Signed)
Patient had labs drawn today. Patient requests refill on Humira sent to mail order pharmacy.

## 2018-09-24 NOTE — Progress Notes (Signed)
Glucose is elevated and calcium is low.  Please advise patient to take calcium supplement and monitor blood sugar.  Please forward labs to her PCP.

## 2018-09-25 LAB — CBC WITH DIFFERENTIAL/PLATELET
Absolute Monocytes: 661 cells/uL (ref 200–950)
BASOS PCT: 1.2 %
Basophils Absolute: 68 cells/uL (ref 0–200)
EOS ABS: 228 {cells}/uL (ref 15–500)
Eosinophils Relative: 4 %
HCT: 38.3 % (ref 35.0–45.0)
Hemoglobin: 12.4 g/dL (ref 11.7–15.5)
Lymphs Abs: 1465 cells/uL (ref 850–3900)
MCH: 27.9 pg (ref 27.0–33.0)
MCHC: 32.4 g/dL (ref 32.0–36.0)
MCV: 86.3 fL (ref 80.0–100.0)
MPV: 10.7 fL (ref 7.5–12.5)
Monocytes Relative: 11.6 %
Neutro Abs: 3278 cells/uL (ref 1500–7800)
Neutrophils Relative %: 57.5 %
Platelets: 229 10*3/uL (ref 140–400)
RBC: 4.44 10*6/uL (ref 3.80–5.10)
RDW: 13 % (ref 11.0–15.0)
Total Lymphocyte: 25.7 %
WBC: 5.7 10*3/uL (ref 3.8–10.8)

## 2018-09-25 LAB — COMPLETE METABOLIC PANEL WITH GFR
AG Ratio: 1.4 (calc) (ref 1.0–2.5)
ALT: 13 U/L (ref 6–29)
AST: 12 U/L (ref 10–35)
Albumin: 3.6 g/dL (ref 3.6–5.1)
Alkaline phosphatase (APISO): 96 U/L (ref 33–130)
BILIRUBIN TOTAL: 0.4 mg/dL (ref 0.2–1.2)
BUN: 12 mg/dL (ref 7–25)
CHLORIDE: 107 mmol/L (ref 98–110)
CO2: 24 mmol/L (ref 20–32)
Calcium: 8.3 mg/dL — ABNORMAL LOW (ref 8.6–10.4)
Creat: 1.01 mg/dL (ref 0.50–1.05)
GFR, Est African American: 73 mL/min/{1.73_m2} (ref >=60)
GFR, Est Non African American: 63 mL/min/{1.73_m2} (ref >=60)
Globulin: 2.5 g/dL (calc) (ref 1.9–3.7)
Glucose, Bld: 204 mg/dL — ABNORMAL HIGH (ref 65–99)
Potassium: 4 mmol/L (ref 3.5–5.3)
Sodium: 139 mmol/L (ref 135–146)
Total Protein: 6.1 g/dL (ref 6.1–8.1)

## 2018-09-25 LAB — QUANTIFERON-TB GOLD PLUS
Mitogen-NIL: >10 IU/mL
NIL: 0.05 IU/mL
QuantiFERON-TB Gold Plus: NEGATIVE
TB1-NIL: 0 IU/mL
TB2-NIL: 0.01 IU/mL

## 2018-09-25 MED ORDER — ADALIMUMAB 40 MG/0.8ML ~~LOC~~ AJKT: 40.0000 mg | AUTO-INJECTOR | SUBCUTANEOUS | 0 refills | Status: DC

## 2018-09-25 NOTE — Telephone Encounter (Signed)
Last Visit: 06/10/18 Next Visit: 11/11/18 Labs: 09/23/18 Glucose is elevated and calcium is low. CBC WNL Tb Gold: 09/23/18 Neg   Okay to refill per Dr. Estanislado Pandy

## 2018-09-25 NOTE — Progress Notes (Signed)
TB gold negative

## 2018-09-25 NOTE — Addendum Note (Signed)
Addended by: Carole Binning on: 09/25/2018 03:22 PM   Modules accepted: Orders

## 2018-09-30 ENCOUNTER — Ambulatory Visit: Payer: Self-pay

## 2018-10-04 MED FILL — HUMIRA PEN 40 MG/0.8ML PNKT: 40 | 84 days supply | Qty: 6 | Fill #0

## 2018-10-17 ENCOUNTER — Ambulatory Visit (INDEPENDENT_AMBULATORY_CARE_PROVIDER_SITE_OTHER): Payer: Self-pay

## 2018-10-17 ENCOUNTER — Ambulatory Visit (INDEPENDENT_AMBULATORY_CARE_PROVIDER_SITE_OTHER): Payer: Medicare Other | Admitting: Rheumatology

## 2018-10-17 ENCOUNTER — Encounter: Payer: Self-pay | Admitting: Rheumatology

## 2018-10-17 VITALS — BP 126/83 | HR 101 | Resp 15 | Ht 61.0 in | Wt 198.0 lb

## 2018-10-17 DIAGNOSIS — M25511 Pain in right shoulder: Secondary | ICD-10-CM | POA: Diagnosis not present

## 2018-10-17 DIAGNOSIS — M797 Fibromyalgia: Secondary | ICD-10-CM | POA: Diagnosis not present

## 2018-10-17 DIAGNOSIS — Z79899 Other long term (current) drug therapy: Secondary | ICD-10-CM

## 2018-10-17 DIAGNOSIS — M25521 Pain in right elbow: Secondary | ICD-10-CM

## 2018-10-17 DIAGNOSIS — Z85528 Personal history of other malignant neoplasm of kidney: Secondary | ICD-10-CM

## 2018-10-17 DIAGNOSIS — F5101 Primary insomnia: Secondary | ICD-10-CM

## 2018-10-17 DIAGNOSIS — M0609 Rheumatoid arthritis without rheumatoid factor, multiple sites: Secondary | ICD-10-CM

## 2018-10-17 DIAGNOSIS — J449 Chronic obstructive pulmonary disease, unspecified: Secondary | ICD-10-CM

## 2018-10-17 DIAGNOSIS — G8929 Other chronic pain: Secondary | ICD-10-CM

## 2018-10-17 DIAGNOSIS — Z8639 Personal history of other endocrine, nutritional and metabolic disease: Secondary | ICD-10-CM

## 2018-10-17 DIAGNOSIS — M25512 Pain in left shoulder: Secondary | ICD-10-CM

## 2018-10-17 DIAGNOSIS — E785 Hyperlipidemia, unspecified: Secondary | ICD-10-CM

## 2018-10-17 DIAGNOSIS — Z9889 Other specified postprocedural states: Secondary | ICD-10-CM

## 2018-10-17 DIAGNOSIS — Z8719 Personal history of other diseases of the digestive system: Secondary | ICD-10-CM

## 2018-10-17 NOTE — Patient Instructions (Signed)
Standing Labs We placed an order today for your standing lab work.    Please come back and get your standing labs in April and every 3 months  We have open lab Monday through Friday from 8:30-11:30 AM and 1:30-4:00 PM  at the office of Dr. Shaili Deveshwar.   You may experience shorter wait times on Monday and Friday afternoons. The office is located at [address], [address], [place], [place] [address] No appointment is necessary.   Labs are drawn by [facility].  You may receive a bill from [facility] for your lab work.  If you wish to have your labs drawn at another location, please call the office 24 hours in advance to send orders.  If you have any questions regarding directions or hours of operation,  please call [phone].   Just as a reminder please drink plenty of water prior to coming for your lab work. Thanks!  

## 2018-10-17 NOTE — Progress Notes (Signed)
Office Visit Note  Patient: Kimberly Jenkins             Date of Birth: [birth year]           MRN: [id number]             PCP: Welford Roche, NP Referring: Welford Roche, NP Visit Date: 10/17/2018 Occupation: @GUAROCC @  Subjective:  Right elbow pain   History of Present Illness: Tonishia Steffy is a 53 y.o. female with history of seronegative rheumatoid arthritis and fibromyalgia. She is on Humira 40 mg sq injections every 14 days and Arava 20 mg po daily.  She denies any recent rheumatoid arthritis flares.  She feels as though these medications have been effective.  She states that she is having pain in bilateral hands.  She states that recently she was diagnosed with carpal tunnel of the right wrist.  She states she is having pain in her right elbow and right shoulder as well.  She would like x-rays of both today.  She reports her fibromyalgia has been flaring more frequently.  She continues have generalized muscle aches and muscle tenderness.  She states that she has chronic fatigue and insomnia.  She has not been sleeping well at night due to the pain she has been experiencing in her right upper extremity.  She continues to take gabapentin as prescribed as well as hydrocodone for pain relief.  Patient states she had nerve conduction velocity done yesterday at her PCPs office.  The results are still pending.  She denies any joint swelling.  Patient states she had x-ray of her cervical spine and her chest yesterday at her PCPs office.  She states that showed some arthritis.  I do not have the x-rays available.    Activities of Daily Living:  Patient reports morning stiffness for 1 hour.   Patient Reports nocturnal pain.  Difficulty dressing/grooming: Denies Difficulty climbing stairs: Reports Difficulty getting out of chair: Reports Difficulty using hands for taps, buttons, cutlery, and/or writing: Reports  Review of Systems  Constitutional: Positive for fatigue.  HENT:  Positive for mouth dryness. Negative for mouth sores and nose dryness.   Eyes: Negative for pain, visual disturbance and dryness.  Respiratory: Positive for cough. Negative for hemoptysis, shortness of breath and difficulty breathing.   Cardiovascular: Negative for chest pain, palpitations, hypertension and swelling in legs/feet.  Gastrointestinal: Positive for constipation. Negative for blood in stool and diarrhea.  Endocrine: Negative for increased urination.  Genitourinary: Negative for painful urination.  Musculoskeletal: Positive for arthralgias, joint pain, myalgias, morning stiffness, muscle tenderness and myalgias. Negative for joint swelling and muscle weakness.  Skin: Negative for color change, pallor, rash, hair loss, nodules/bumps, skin tightness, ulcers and sensitivity to sunlight.  Allergic/Immunologic: Negative for susceptible to infections.  Neurological: Positive for headaches. Negative for dizziness, numbness and weakness.  Hematological: Negative for swollen glands.  Psychiatric/Behavioral: Positive for depressed mood and sleep disturbance. The patient is nervous/anxious.     PMFS History:  Patient Active Problem List   Diagnosis Date Noted  . Fibromyalgia 08/27/2016  . History of right rotator cuff tear repair 08/27/2016  . Diabetes 08/27/2016  . Chronic obstructive pulmonary disease ([place]) 08/27/2016  . Dyslipidemia 08/27/2016  . History of renal cell carcinoma 08/27/2016  . Anxiety 08/27/2016  . Sleep apnea 08/27/2016  . Rheumatoid arthritis of multiple sites with negative rheumatoid factor  07/18/2016  . High risk medication use 07/18/2016  . Hand pain 07/13/2016  . Foot pain 07/13/2016  .  Elevated sed rate 07/13/2016  . Severe persistent asthma 04/30/2015  . Other allergic rhinitis 04/30/2015  . GERD (gastroesophageal reflux disease) 04/30/2015    Past Medical History:  Diagnosis Date  . Allergic rhinitis   . Asthma   . Cancer of kidney ([place])   . COPD  (chronic obstructive pulmonary disease) ([place])   . Dehydration   . GERD (gastroesophageal reflux disease)   . Hypothyroidism   . Kidney disease   . Rheumatoid arthritis ([place])   . Sleep apnea   . Tremor     Family History  Problem Relation Age of Onset  . Brain cancer Brother   . Asthma Mother   . Lung cancer Mother   . Asthma Father   . Allergic rhinitis Father   . COPD Father   . Heart attack Father   . Brain cancer Paternal Uncle   . Asthma Maternal Grandmother   . Brain cancer Paternal Grandmother   . Heart attack Brother    Past Surgical History:  Procedure Laterality Date  . APPENDECTOMY    . CESAREAN SECTION     2 times  . CHOLECYSTECTOMY    . KIDNEY SURGERY    . SHOULDER SURGERY    . TONSILLECTOMY    . TOTAL SHOULDER ARTHROPLASTY    . TUBAL LIGATION     Social History   Social History Narrative  . Not on file    There is no immunization history on file for this patient.   Objective: Vital Signs: BP 126/83 (BP Location: Left Arm, Patient Position: Sitting, Cuff Size: Normal)   Pulse (!) 101   Resp 15   Ht 5\' 1"  (1.549 m)   Wt 198 lb (89.8 kg)   BMI 37.41 kg/m    Physical Exam Vitals signs and nursing note reviewed.  Constitutional:      Appearance: She is well-developed.  HENT:     Head: Normocephalic and atraumatic.  Eyes:     Conjunctiva/sclera: Conjunctivae normal.  Neck:     Musculoskeletal: Normal range of motion.  Cardiovascular:     Rate and Rhythm: Normal rate and regular rhythm.     Heart sounds: Normal heart sounds.  Pulmonary:     Effort: Pulmonary effort is normal.     Breath sounds: Normal breath sounds.  Abdominal:     General: Bowel sounds are normal.     Palpations: Abdomen is soft.  Lymphadenopathy:     Cervical: No cervical adenopathy.  Skin:    General: Skin is warm and dry.     Capillary Refill: Capillary refill takes less than 2 seconds.  Neurological:     Mental Status: She is alert and oriented to person, place,  and time.  Psychiatric:        Behavior: Behavior normal.      Musculoskeletal Exam: C-spine limited range of motion.  Thoracic and lumbar spine good range of motion.  She had a right shoulder joint abduction limited to 90 degrees which is painful.  She has discomfort with internal and external rotation.  Left elbow joint was in good range of motion.  Elbow joints wrist joint MCPs PIPs with good range of motion with no synovitis.  Hip joints knee joints ankles MTPs PIPs been good range of motion with no synovitis.  She has generalized hyperalgesia.  CDAI Exam: CDAI Score: Not documented Patient Global Assessment: Not documented; Provider Global Assessment: Not documented Swollen: Not documented; Tender: Not documented Joint Exam   Not documented  There is currently no information documented on the homunculus. Go to the Rheumatology activity and complete the homunculus joint exam.  Investigation: No additional findings.  Imaging: Xr Elbow 2 Views Right  Result Date: 10/17/2018 No joint space narrowing was noted.  No erosive changes were noted. Impression: Unremarkable x-ray of the elbow joint.  Xr Shoulder Right  Result Date: 10/17/2018 No glenohumeral joint space narrowing was noted.  No chondrocalcinosis was noted.  Romeo clavicular joint space narrowing was noted. Impression: Unremarkable x-ray of the shoulder joint.   Recent Labs: Lab Results  Component Value Date   WBC 5.7 09/23/2018   HGB 12.4 09/23/2018   PLT 229 09/23/2018   NA 139 09/23/2018   K 4.0 09/23/2018   CL 107 09/23/2018   CO2 24 09/23/2018   GLUCOSE 204 (H) 09/23/2018   BUN 12 09/23/2018   CREATININE 1.01 09/23/2018   BILITOT 0.4 09/23/2018   ALKPHOS 119 02/01/2017   AST 12 09/23/2018   ALT 13 09/23/2018   PROT 6.1 09/23/2018   ALBUMIN 4.3 02/01/2017   CALCIUM 8.3 (L) 09/23/2018   GFRAA 73 09/23/2018   QFTBGOLD NEGATIVE 06/26/2017   QFTBGOLDPLUS NEGATIVE 09/23/2018    Speciality Comments: No  specialty comments available.  Procedures:  No procedures performed Allergies: Methotrexate; Advair diskus [fluticasone-salmeterol]; Aspirin; Ciprofloxacin; Clonidine derivatives; Nystatin; Oxycodone; Serevent [salmeterol]; and Cephalexin    Assessment / Plan:     Visit Diagnoses: Rheumatoid arthritis of multiple sites with negative rheumatoid factor -patient had no synovitis on examination today.  Her rheumatoid arthritis is very well controlled with Humira and Arava combination.  Chronic pain of both shoulders -she has been experiencing increased pain in the right shoulder and has difficulty with range of motion.  The x-ray today was unremarkable.  Plan: XR Shoulder Right  Pain in joint of right elbow -she complains of increased elbow joint pain.  She had mild lateral epicondylitis.  No synovitis was noted.  The x-ray was unremarkable.  Plan: XR Elbow 2 Views Right   High risk medication use - Humira 40 mg subcutaneous every other week(11/02/2016), Arava 20 mg by mouth daily (12/13/2016) (inadequate response to Enbrel, side effects from methotrexate).  Her labs have been stable.  She will need repeat labs in April.  Fibromyalgia-she continues to have generalized pain and discomfort due to fibromyalgia.  History of rotator cuff surgery-patient had surgery on her right shoulder by Dr. Ronnie Derby in the past.  She has limited painful range of motion of her right shoulder today.  Her x-ray was unremarkable.  I will refer her to Dr. Ronnie Derby for evaluation.  Primary insomnia-controlled with medications.  History of diabetes mellitus  History of renal cell carcinoma  History of gastroesophageal reflux (GERD)  Chronic obstructive pulmonary disease, unspecified COPD type ([place])  History of hypothyroidism  Dyslipidemia    Orders: Orders Placed This Encounter  Procedures  . XR Elbow 2 Views Right  . XR Shoulder Right  . Ambulatory referral to Orthopedic Surgery   No orders of the defined  types were placed in this encounter.   Face-to-face time spent with patient was 40 minutes. Greater than 50% of time was spent in counseling and coordination of care.  Follow-Up Instructions: Return in about 5 months (around 03/17/2019) for Rheumatoid arthritis, Fibromyalgia.   Bo Merino, MD  Note - This record has been created using Editor, commissioning.  Chart creation errors have been sought, but may not always  have been located. Such creation errors do not reflect  on  the standard of medical care.

## 2018-10-30 ENCOUNTER — Encounter: Payer: Self-pay | Admitting: Allergy and Immunology

## 2018-10-30 ENCOUNTER — Encounter: Payer: Self-pay | Admitting: Rheumatology

## 2018-10-31 ENCOUNTER — Other Ambulatory Visit: Payer: Self-pay | Admitting: Rheumatology

## 2018-10-31 NOTE — Telephone Encounter (Signed)
Please ask pt to get [facility] labs

## 2018-10-31 NOTE — Telephone Encounter (Signed)
Spoke with patient and advised Dr.Deveshwar would like for her to have [place] labs performed. Patient will come by the office to pick up form to take to the ANY LAB Test now. Patient advised we will contact her once we receive results.

## 2018-11-01 ENCOUNTER — Encounter: Payer: Self-pay | Admitting: Rheumatology

## 2018-11-04 ENCOUNTER — Ambulatory Visit (INDEPENDENT_AMBULATORY_CARE_PROVIDER_SITE_OTHER): Payer: Medicare Other | Admitting: *Deleted

## 2018-11-04 DIAGNOSIS — J4550 Severe persistent asthma, uncomplicated: Secondary | ICD-10-CM

## 2018-11-08 ENCOUNTER — Other Ambulatory Visit: Payer: Self-pay | Admitting: Allergy and Immunology

## 2018-11-08 MED ORDER — OMALIZUMAB 150 MG ~~LOC~~ SOLR: 375.0000 mg | SUBCUTANEOUS | 11 refills | Status: DC

## 2018-11-11 ENCOUNTER — Ambulatory Visit: Payer: Medicare Other | Admitting: Physician Assistant

## 2018-11-11 ENCOUNTER — Telehealth: Payer: Self-pay | Admitting: Rheumatology

## 2018-11-11 NOTE — Telephone Encounter (Signed)
Patient advised she should contact Dr. Ruel Favors office and make an appointment with them for treatment of OA Patient verbalized understanding.

## 2018-11-11 NOTE — Telephone Encounter (Signed)
Patient states she is going to have to have surgery on her elbow as she has a pinched nerve. Patient states she going to see the surgeon [facility].. Patient states when she saw Dr. Ruel Favors PA advised her to contact our office. Patient states that she was advised that her RA may be under control but her OA is not under control and she needs to discuss that with [place]. Please advise.

## 2018-11-11 NOTE — Telephone Encounter (Signed)
Patient called requesting a return call.  Patient states she had an appointment with Dr. Ronnie Derby and has additional questions.

## 2018-11-12 DIAGNOSIS — G5621 Lesion of ulnar nerve, right upper limb: Secondary | ICD-10-CM | POA: Insufficient documentation

## 2018-11-18 ENCOUNTER — Ambulatory Visit: Payer: Self-pay

## 2018-11-26 ENCOUNTER — Encounter: Payer: Self-pay | Admitting: Allergy and Immunology

## 2018-11-29 ENCOUNTER — Telehealth: Payer: Self-pay | Admitting: Rheumatology

## 2018-11-29 NOTE — Telephone Encounter (Signed)
Patient had a test done to see if she has Lupus. Per patient, doctor told her she would be getting a call with the results. Patient has not heard anything, and wants those results. Please call to advise.

## 2018-12-03 NOTE — Telephone Encounter (Signed)
Attempted to contact the patient and left message for patient to call the office.  

## 2018-12-05 ENCOUNTER — Ambulatory Visit (INDEPENDENT_AMBULATORY_CARE_PROVIDER_SITE_OTHER): Payer: Medicare Other | Admitting: *Deleted

## 2018-12-05 DIAGNOSIS — J4550 Severe persistent asthma, uncomplicated: Secondary | ICD-10-CM

## 2018-12-07 ENCOUNTER — Encounter: Payer: Self-pay | Admitting: Rheumatology

## 2018-12-10 ENCOUNTER — Other Ambulatory Visit: Payer: Self-pay | Admitting: Allergy and Immunology

## 2018-12-10 NOTE — Progress Notes (Signed)
Virtual Visit via Telephone Note  I connected with Kimberly Jenkins on 12/10/18 at 12:15 PM EDT by telephone and verified that I am speaking with the correct person using two identifiers.   I discussed the limitations, risks, security and privacy concerns of performing an evaluation and management service by telephone and the availability of in person appointments. I also discussed with the patient that there may be a patient responsible charge related to this service. The patient expressed understanding and agreed to proceed.  CC: Generalized myalgias   History of Present Illness: Patient is a 53 year old female with a past medical history of seronegative rheumatoid arthritis and fibromyalgia.  She is on Arava 20 mg po daily and Humira 40 mg sq injections every 14 days.   She denies any recent RA flares.  She has no joint swelling at this time.  She has morning stiffness for about 30 minutes.  She has been having more frequent fibromyalgia flares.  She has chronic fatigue and insomnia.  She takes Trazodone 50 mg po at bedtime for insomnia.    Review of Systems  Constitutional: Positive for malaise/fatigue. Negative for fever.  HENT:       +mouth dryness  Eyes: Negative for photophobia, pain, discharge and redness.  Respiratory: Negative for cough, shortness of breath and wheezing.   Cardiovascular: Negative for chest pain and palpitations.  Gastrointestinal: Negative for blood in stool, constipation and diarrhea.  Genitourinary: Negative for dysuria.  Musculoskeletal: Positive for back pain, joint pain and myalgias. Negative for neck pain.       +morning stiffness  Skin: Negative for rash.  Neurological: Negative for dizziness and headaches.  Psychiatric/Behavioral: Positive for depression. The patient is nervous/anxious and has insomnia.      Observations/Objective:  Physical Exam  Constitutional: She is oriented to person, place, and time.  Neurological: She is alert and  oriented to person, place, and time.  Psychiatric: Mood, memory, affect and judgment normal.   Patient reports morning stiffness for 30 minutes.   Patient reports nocturnal pain.  Difficulty dressing/grooming: Denies Difficulty climbing stairs: Reports Difficulty getting out of chair: Reports Difficulty using hands for taps, buttons, cutlery, and/or writing: Denies  Assessment and Plan: Rheumatoid arthritis of multiple sites with negative rheumatoid factor -She has not had any recent rheumatoid arthritis flares. She has no joint swelling at this time.  She continues to have generalized arthralgias and myalgias due to fibromyalgia. She is clinically doing well on Humira 40 mg sq injections every 14 days and Arava 20 mg po daily.  She has not missed any doses.  She does not need any refills at this time.  She will continue on this current treatment regimen.  She was advised to notify us if she develops increased joint pain or joint swelling.  She will follow up in 3 -4 months.   Chronic pain of both shoulders -Right shoulder X-rays on 10/17/18 were unremarkable.   Pain in joint of right elbow - She had mild lateral epicondylitis.The x-ray was unremarkable on 10/17/18.  Her surgery was postponed for cubital tunnel and carpal tunnel syndrome.  High risk medication use - Humira 40 mg subcutaneous every other week(11/02/2016), Arava 20 mg by mouth daily (12/13/2016) (inadequate response to Enbrel, side effects from methotrexate). she was advised to hold her dose of Humira if she develops an infection. CBC and CMP were drawn on 09/23/18.  TB gold negative 09/23/18. She will be due for repeat CBC and CMP this month.  Standing orders are in place.  Fibromyalgia-She is having generalized muscle aches and muscle tenderness due to fibromyalgia.  She has been more sedentary recently.  She has been having more frequent fibromyalgia flares.  She has chronic fatigue and insomnia.  She has been having difficulty  falling asleep due to the discomfort she experiences.  The importance of regular exercise and good sleep hygiene was discussed.     History of rotator cuff surgery-She had surgery on her right shoulder by Dr. Ronnie Derby in the past.   Primary insomnia-She takes trazodone at bedtime for insomnia.   Positive ANA: ANA 1:320 NS, Index -1.8 on 11/01/18.  She has no clinical features of autoimmune disease.   Follow Up Instructions: She will follow up in 3-4 months. She is due for updated CBC and CMP this month.  Standing orders are in place.    I discussed the assessment and treatment plan with the patient. The patient was provided an opportunity to ask questions and all were answered. The patient agreed with the plan and demonstrated an understanding of the instructions.   The patient was advised to call back or seek an in-person evaluation if the symptoms worsen or if the condition fails to improve as anticipated.  I provided 22 minutes of non-face-to-face time during this encounter.  Bo Merino, MD  Scribed by-  Ofilia Neas, PA-C

## 2018-12-11 ENCOUNTER — Other Ambulatory Visit: Payer: Self-pay

## 2018-12-11 ENCOUNTER — Encounter: Payer: Self-pay | Admitting: Rheumatology

## 2018-12-11 ENCOUNTER — Telehealth (INDEPENDENT_AMBULATORY_CARE_PROVIDER_SITE_OTHER): Payer: Medicare Other | Admitting: Rheumatology

## 2018-12-11 DIAGNOSIS — M797 Fibromyalgia: Secondary | ICD-10-CM

## 2018-12-11 DIAGNOSIS — Z79899 Other long term (current) drug therapy: Secondary | ICD-10-CM

## 2018-12-11 DIAGNOSIS — Z8719 Personal history of other diseases of the digestive system: Secondary | ICD-10-CM

## 2018-12-11 DIAGNOSIS — M25511 Pain in right shoulder: Secondary | ICD-10-CM

## 2018-12-11 DIAGNOSIS — Z8639 Personal history of other endocrine, nutritional and metabolic disease: Secondary | ICD-10-CM

## 2018-12-11 DIAGNOSIS — R768 Other specified abnormal immunological findings in serum: Secondary | ICD-10-CM

## 2018-12-11 DIAGNOSIS — M0609 Rheumatoid arthritis without rheumatoid factor, multiple sites: Secondary | ICD-10-CM

## 2018-12-11 DIAGNOSIS — Z9889 Other specified postprocedural states: Secondary | ICD-10-CM

## 2018-12-11 DIAGNOSIS — F5101 Primary insomnia: Secondary | ICD-10-CM

## 2018-12-11 DIAGNOSIS — J449 Chronic obstructive pulmonary disease, unspecified: Secondary | ICD-10-CM

## 2018-12-11 DIAGNOSIS — E785 Hyperlipidemia, unspecified: Secondary | ICD-10-CM

## 2018-12-11 DIAGNOSIS — M25512 Pain in left shoulder: Secondary | ICD-10-CM

## 2018-12-11 DIAGNOSIS — R7689 Other specified abnormal immunological findings in serum: Secondary | ICD-10-CM

## 2018-12-11 DIAGNOSIS — Z85528 Personal history of other malignant neoplasm of kidney: Secondary | ICD-10-CM

## 2018-12-11 DIAGNOSIS — G8929 Other chronic pain: Secondary | ICD-10-CM

## 2018-12-19 ENCOUNTER — Ambulatory Visit: Payer: Self-pay

## 2018-12-21 ENCOUNTER — Other Ambulatory Visit: Payer: Self-pay | Admitting: Rheumatology

## 2018-12-23 ENCOUNTER — Other Ambulatory Visit: Payer: Self-pay | Admitting: *Deleted

## 2018-12-23 MED ORDER — EPINEPHRINE 0.3 MG/0.3ML IJ SOAJ: INTRAMUSCULAR | 3 refills | Status: AC

## 2018-12-23 NOTE — Telephone Encounter (Signed)
Last Visit: 12/11/2018 telemedicine  Next Visit: 03/18/2019 Labs: 09/23/2018 Glucose is elevated and calcium is low. TB Gold: 09/23/2018 negative   Okay to refill per Dr. Estanislado Pandy.

## 2018-12-24 MED FILL — HUMIRA PEN 40 MG/0.8ML PNKT: 40 | 84 days supply | Qty: 6 | Fill #0

## 2018-12-25 ENCOUNTER — Encounter: Payer: Self-pay | Admitting: Allergy and Immunology

## 2019-01-01 ENCOUNTER — Ambulatory Visit (INDEPENDENT_AMBULATORY_CARE_PROVIDER_SITE_OTHER): Payer: Medicare Other | Admitting: *Deleted

## 2019-01-01 DIAGNOSIS — J4550 Severe persistent asthma, uncomplicated: Secondary | ICD-10-CM

## 2019-01-15 ENCOUNTER — Ambulatory Visit: Payer: Self-pay

## 2019-01-24 ENCOUNTER — Encounter: Payer: Self-pay | Admitting: Allergy and Immunology

## 2019-01-24 ENCOUNTER — Encounter: Payer: Self-pay | Admitting: Rheumatology

## 2019-02-03 ENCOUNTER — Ambulatory Visit (INDEPENDENT_AMBULATORY_CARE_PROVIDER_SITE_OTHER): Payer: Medicare Other | Admitting: *Deleted

## 2019-02-03 ENCOUNTER — Other Ambulatory Visit: Payer: Self-pay

## 2019-02-03 DIAGNOSIS — J4550 Severe persistent asthma, uncomplicated: Secondary | ICD-10-CM

## 2019-02-04 ENCOUNTER — Telehealth: Payer: Self-pay | Admitting: *Deleted

## 2019-02-04 NOTE — Telephone Encounter (Signed)
Kimberly Jenkins requested a new Nebulizer machine because hers no longer turns on. I did inform her that if it has not been over 5 years since insurance paid for her current machine that she could potentially be responsible for the entire cost of the machine. She voiced understanding. Per Kimberly Jenkins's request, an order has been sent to [facility] for a new machine.

## 2019-02-17 ENCOUNTER — Other Ambulatory Visit: Payer: Self-pay

## 2019-02-17 ENCOUNTER — Ambulatory Visit (INDEPENDENT_AMBULATORY_CARE_PROVIDER_SITE_OTHER): Payer: Medicare Other | Admitting: *Deleted

## 2019-02-17 DIAGNOSIS — J4550 Severe persistent asthma, uncomplicated: Secondary | ICD-10-CM | POA: Diagnosis not present

## 2019-03-03 ENCOUNTER — Other Ambulatory Visit: Payer: Self-pay

## 2019-03-03 ENCOUNTER — Ambulatory Visit (INDEPENDENT_AMBULATORY_CARE_PROVIDER_SITE_OTHER): Payer: Medicare Other | Admitting: *Deleted

## 2019-03-03 DIAGNOSIS — J4550 Severe persistent asthma, uncomplicated: Secondary | ICD-10-CM

## 2019-03-06 NOTE — Progress Notes (Signed)
Office Visit Note  Patient: Kimberly Jenkins             Date of Birth: 1966-08-14           MRN: [id number]             PCP: Clancy Gourd, NP Referring: Welford Roche, NP Visit Date: 03/18/2019 Occupation: @GUAROCC @  Subjective:  Left knee and left ankle pain.    History of Present Illness: Kimberly Jenkins is a 53 y.o. female with history of rheumatoid arthritis.  She states she continues to have some discomfort in her joints.  She states just prior to her next Humira injection she started having pain in her left hand.  She is also has some discomfort in the left knee joint and her left ankle.  She denies any joint swelling.  She states her depression is getting worse.  She has been seeing her psychiatrist on a regular basis.  She was also seen by her nephrologist recently.  Patient states that she could not have the right ulnar nerve release in the carpal tunnel release secondary to pandemic.  She continues to have some symptoms related to that.  Activities of Daily Living:  Patient reports morning stiffness for 15 minutes.   Patient Denies nocturnal pain.  Difficulty dressing/grooming: Denies Difficulty climbing stairs: Reports Difficulty getting out of chair: Reports Difficulty using hands for taps, buttons, cutlery, and/or writing: Denies  Review of Systems  Constitutional: Positive for fatigue. Negative for night sweats, weight gain and weight loss.  HENT: Negative for mouth sores, trouble swallowing, trouble swallowing, mouth dryness and nose dryness.   Eyes: Negative for pain, redness, visual disturbance and dryness.  Respiratory: Negative for cough, shortness of breath and difficulty breathing.   Cardiovascular: Negative for chest pain, palpitations, hypertension, irregular heartbeat and swelling in legs/feet.  Gastrointestinal: Positive for constipation. Negative for blood in stool and diarrhea.  Endocrine: Negative for increased urination.  Genitourinary:  Negative for vaginal dryness.  Musculoskeletal: Positive for arthralgias, joint pain, myalgias, morning stiffness and myalgias. Negative for joint swelling, muscle weakness and muscle tenderness.  Skin: Negative for color change, rash, hair loss, skin tightness, ulcers and sensitivity to sunlight.  Allergic/Immunologic: Negative for susceptible to infections.  Neurological: Negative for dizziness, memory loss, night sweats and weakness.  Hematological: Negative for swollen glands.  Psychiatric/Behavioral: Positive for depressed mood and sleep disturbance. The patient is nervous/anxious.     PMFS History:  Patient Active Problem List   Diagnosis Date Noted  . Fibromyalgia 08/27/2016  . History of right rotator cuff tear repair 08/27/2016  . Diabetes 08/27/2016  . Chronic obstructive pulmonary disease ([place]) 08/27/2016  . Dyslipidemia 08/27/2016  . History of renal cell carcinoma 08/27/2016  . Anxiety 08/27/2016  . Sleep apnea 08/27/2016  . Rheumatoid arthritis of multiple sites with negative rheumatoid factor  07/18/2016  . High risk medication use 07/18/2016  . Hand pain 07/13/2016  . Foot pain 07/13/2016  . Elevated sed rate 07/13/2016  . Severe persistent asthma 04/30/2015  . Other allergic rhinitis 04/30/2015  . GERD (gastroesophageal reflux disease) 04/30/2015    Past Medical History:  Diagnosis Date  . Allergic rhinitis   . Asthma   . Cancer of kidney ([place])   . COPD (chronic obstructive pulmonary disease) ([place])   . Dehydration   . GERD (gastroesophageal reflux disease)   . Hypothyroidism   . Kidney disease   . Rheumatoid arthritis ([place])   . Sleep apnea   .  Tremor     Family History  Problem Relation Age of Onset  . Brain cancer Brother   . Asthma Mother   . Lung cancer Mother   . Asthma Father   . Allergic rhinitis Father   . COPD Father   . Heart attack Father   . Brain cancer Paternal Uncle   . Asthma Maternal Grandmother   . Brain cancer Paternal  Grandmother   . Heart attack Brother    Past Surgical History:  Procedure Laterality Date  . APPENDECTOMY    . CESAREAN SECTION     2 times  . CHOLECYSTECTOMY    . KIDNEY SURGERY    . SHOULDER SURGERY    . TONSILLECTOMY    . TOTAL SHOULDER ARTHROPLASTY    . TUBAL LIGATION     Social History   Social History Narrative  . Not on file    There is no immunization history on file for this patient.   Objective: Vital Signs: BP 115/73 (BP Location: Left Arm, Patient Position: Sitting, Cuff Size: Small)   Pulse 82   Resp 12   Ht 5\' 1"  (1.549 m)   Wt 200 lb (90.7 kg)   BMI 37.79 kg/m    Physical Exam Vitals signs and nursing note reviewed.  Constitutional:      Appearance: She is well-developed.  HENT:     Head: Normocephalic and atraumatic.  Eyes:     Conjunctiva/sclera: Conjunctivae normal.  Neck:     Musculoskeletal: Normal range of motion.  Cardiovascular:     Rate and Rhythm: Normal rate and regular rhythm.     Heart sounds: Normal heart sounds.  Pulmonary:     Effort: Pulmonary effort is normal.     Breath sounds: Normal breath sounds.  Abdominal:     General: Bowel sounds are normal.     Palpations: Abdomen is soft.  Lymphadenopathy:     Cervical: No cervical adenopathy.  Skin:    General: Skin is warm and dry.     Capillary Refill: Capillary refill takes less than 2 seconds.  Neurological:     Mental Status: She is alert and oriented to person, place, and time.  Psychiatric:        Behavior: Behavior normal.    Musculoskeletal Exam: Patient has some stiffness range of motion of her cervical spine.  She has discomfort range of motion of her lumbar spine.  Shoulder joints elbow joints wrist joints are in good range of motion.  She has some tenderness of her right wrist joint but no synovitis was noted.  DIP and PIP thickening was noted.  She had discomfort range of motion of left knee joint and left ankle joint but no warmth and swelling was noted.  CDAI  Exam: CDAI Score: 2.8  Patient Global: 4 mm; Provider Global: 4 mm Swollen: 0 ; Tender: 3  Joint Exam      Right  Left  Wrist   Tender     Knee      Tender  Ankle      Tender     Investigation: No additional findings.  Imaging: No results found.  Recent Labs: Lab Results  Component Value Date   WBC 5.7 09/23/2018   HGB 12.4 09/23/2018   PLT 229 09/23/2018   NA 139 09/23/2018   K 4.0 09/23/2018   CL 107 09/23/2018   CO2 24 09/23/2018   GLUCOSE 204 (H) 09/23/2018   BUN 12 09/23/2018   CREATININE 1.01 09/23/2018  BILITOT 0.4 09/23/2018   ALKPHOS 119 02/01/2017   AST 12 09/23/2018   ALT 13 09/23/2018   PROT 6.1 09/23/2018   ALBUMIN 4.3 02/01/2017   CALCIUM 8.3 (L) 09/23/2018   GFRAA 73 09/23/2018   QFTBGOLD NEGATIVE 06/26/2017   QFTBGOLDPLUS NEGATIVE 09/23/2018    Speciality Comments: No specialty comments available.  Procedures:  No procedures performed Allergies: Methotrexate, Advair diskus [fluticasone-salmeterol], Aspirin, Ciprofloxacin, Clonidine derivatives, Nystatin, Oxycodone, Serevent [salmeterol], and Cephalexin   Assessment / Plan:     Visit Diagnoses: Rheumatoid arthritis of multiple sites with negative rheumatoid factor  -patient complains of intermittent joint pain and discomfort.  No warmth swelling or effusion was noted.  No synovitis was noted.  She appears to be doing well on combination of Humira and Arava.  High risk medication use - Humira 40 mg subcutaneous every other week(11/02/2016), Arava 20 mg by mouth daily (12/13/2016) (inadequate response to Enbrel, side effects from methotrexate). - Plan: CBC with Differential/Platelet, COMPLETE METABOLIC PANEL WITH GFR, her labs are past 2.  We will check labs today.  Fibromyalgia -she continues to have some generalized pain and discomfort due to fibromyalgia.  History of rotator cuff surgery - She had surgery on her right shoulder by Dr. Ronnie Derby in the past.   Primary insomnia -patient has  underlying anxiety and depression which contributes to her insomnia.  Positive ANA (antinuclear antibody) - ANA 1:320 NS,AVISE Index -1.8 on 11/01/18.  She has no clinical features of mixed connective tissue disease.  Chronic midline lower back pain-I have given her a handout on back exercises.  Other medical problems are listed as follows:  Dyslipidemia   History of diabetes mellitus   Chronic obstructive pulmonary disease, unspecified COPD type ([place])   History of hypothyroidism  History of renal cell carcinoma   History of gastroesophageal reflux (GERD)   Orders: Orders Placed This Encounter  Procedures  . CBC with Differential/Platelet  . COMPLETE METABOLIC PANEL WITH GFR   No orders of the defined types were placed in this encounter.    Follow-Up Instructions: Return in about 5 months (around 08/18/2019) for Rheumatoid arthritis, Osteoarthritis.   Bo Merino, MD  Note - This record has been created using Editor, commissioning.  Chart creation errors have been sought, but may not always  have been located. Such creation errors do not reflect on  the standard of medical care.

## 2019-03-10 ENCOUNTER — Other Ambulatory Visit: Payer: Self-pay | Admitting: Allergy and Immunology

## 2019-03-13 ENCOUNTER — Other Ambulatory Visit: Payer: Self-pay

## 2019-03-13 ENCOUNTER — Ambulatory Visit (INDEPENDENT_AMBULATORY_CARE_PROVIDER_SITE_OTHER): Payer: Medicare Other | Admitting: Allergy and Immunology

## 2019-03-13 ENCOUNTER — Encounter: Payer: Self-pay | Admitting: Allergy and Immunology

## 2019-03-13 VITALS — BP 122/74 | HR 94 | Resp 18

## 2019-03-13 DIAGNOSIS — K219 Gastro-esophageal reflux disease without esophagitis: Secondary | ICD-10-CM | POA: Diagnosis not present

## 2019-03-13 DIAGNOSIS — J3089 Other allergic rhinitis: Secondary | ICD-10-CM | POA: Diagnosis not present

## 2019-03-13 DIAGNOSIS — J4550 Severe persistent asthma, uncomplicated: Secondary | ICD-10-CM | POA: Diagnosis not present

## 2019-03-13 NOTE — Patient Instructions (Addendum)
  1. Continue Breo and Spiriva as prescribed by Dr. Alcide Clever  2. Continue Omeprazole 40 one tablet two times per day + Pepcid in evening  3. Continue Montelukast - 10 one tablet one time per day  4. Continue Zyrtec 10 one tablet one time per day  6. Use Duoneb nebulization or Combivent respimat two puffs every 4-6 hours if needed.  7. Continue xolair and epi-pen  8. Return in 6 months or earlier if problem  9. Obtain fall flu vaccine (and COVID vaccine)

## 2019-03-13 NOTE — Progress Notes (Signed)
[facility] [place] [facility] [place]   Follow-up Note  Referring Provider: Welford Roche, NP Primary Provider: Clancy Gourd, NP Date of Office Visit: 03/13/2019  Subjective:   Kimberly Jenkins (DOB: [birth year]) is a 53 y.o. female who returns to the [facility] on 03/13/2019 in re-evaluation of the following:  HPI: Kimberly Jenkins returns to this clinic and evaluation of asthma treated with omalizumab, allergic rhinitis, and LPR.  Her last visit to this clinic was 12 September 2018.  Her asthma has been stable.  Stable for Kimberly Jenkins means that she still continues to cough every day and she uses her bronchodilator every day.  But she has not required a systemic steroid or an antibiotic for any airway issue.  She continues on a large collection of medical therapy directed against respiratory tract inflammation including the use of omalizumab.  She believes that her nose is doing relatively well at this point in time.  She believes that her reflux is doing relatively well at this point in time.  She is under significant stress and is having problems with her depression and anxiety and bipolar disorder and she recently just started a new antidepressant agent administered by [facility].  There is an individual living inside the household that is producing a fair amount of stress in Kimberly Jenkins's life.  Allergies as of 03/13/2019      Reactions   Methotrexate Diarrhea   Advair Diskus [fluticasone-salmeterol]    Aspirin    Ciprofloxacin    Clonidine Derivatives    Nystatin    Oxycodone    Serevent [salmeterol]    Cephalexin Nausea And Vomiting      Medication List    Adipex-P 37.5 MG tablet Generic drug: phentermine Take 37.5 mg by mouth daily before breakfast.   BREO ELLIPTA IN Inhale 1 puff into the lungs daily.   busPIRone 15 MG tablet Commonly known as: BUSPAR Take one tablet three times daily.   cetirizine 10 MG tablet  Commonly known as: ZYRTEC Take 10 mg by mouth daily.   clorazepate 3.75 MG tablet Commonly known as: TRANXENE Take one tablet four times a day as needed.   Combivent Respimat 20-100 MCG/ACT Aers respimat Generic drug: Ipratropium-Albuterol Inhale 1 puff into the lungs every 6 (six) hours as needed for wheezing.   ipratropium-albuterol 0.5-2.5 (3) MG/3ML Soln Commonly known as: DUONEB USE ONE VIAL VIA NEBULIZER EVERY 4-6 HOURS AS NEEDED FOR COUGH OR WHEEZE   Easy Touch Pen Needles 31G X 8 MM Misc Generic drug: Insulin Pen Needle   EPINEPHrine 0.3 mg/0.3 mL Soaj injection Commonly known as: EpiPen 2-Pak Use as directed for life-threatening allergic reaction.   ezetimibe 10 MG tablet Commonly known as: ZETIA Take 10 mg by mouth daily.   famotidine 20 MG tablet Commonly known as: PEPCID Take 1 tablet by mouth at bedtime.   FISH OIL PO Take by mouth.   FLUoxetine 40 MG capsule Commonly known as: PROZAC Take 40 mg by mouth daily.   folic acid 1 MG tablet Commonly known as: FOLVITE TAKE 2 TABLETS BY MOUTH EVERY DAY   furosemide 40 MG tablet Commonly known as: LASIX Take 20 mg by mouth every other day.   gabapentin 400 MG capsule Commonly known as: NEURONTIN Take 600 mg by mouth 3 (three) times daily.   Humira Pen 40 MG/0.8ML Pnkt Generic drug: Adalimumab INJECT 40 MG INTO THE SKIN EVERY 14 (FOURTEEN) DAYS.   HYDROcodone-acetaminophen 10-325 MG  tablet Commonly known as: NORCO Take by mouth.   Lantus SoloStar 100 UNIT/ML Solostar Pen Generic drug: Insulin Glargine as needed.   leflunomide 10 MG tablet Commonly known as: ARAVA Take 2 tablets (20 mg total) by mouth daily.   levothyroxine 50 MCG tablet Commonly known as: SYNTHROID TAKE 1 TABLET BY MOUTH EVERY DAY AT 6 AM   metFORMIN 500 MG tablet Commonly known as: GLUCOPHAGE Take 500 mg by mouth 2 (two) times daily with a meal.   montelukast 10 MG tablet Commonly known as: SINGULAIR Take 1 tablet (10  mg total) by mouth daily.   nystatin cream Commonly known as: MYCOSTATIN as needed.   olopatadine 0.1 % ophthalmic solution Commonly known as: PATANOL   omalizumab 150 MG injection Commonly known as: Xolair Inject 375 mg into the skin every 14 (fourteen) days.   omeprazole 40 MG capsule Commonly known as: PRILOSEC Take 40 mg by mouth 2 (two) times daily.   potassium chloride SA 20 MEQ tablet Commonly known as: K-DUR Take by mouth.   pramipexole 0.125 MG tablet Commonly known as: MIRAPEX Take by mouth.   pravastatin 40 MG tablet Commonly known as: PRAVACHOL Take 40 mg by mouth daily.   rizatriptan 10 MG tablet Commonly known as: MAXALT Take 10 mg by mouth.   SPIRIVA RESPIMAT IN Inhale into the lungs.   triamcinolone cream 0.1 % Commonly known as: KENALOG   VICTOZA [place] Inject into the skin.   Vitamin D (Ergocalciferol) 1.25 MG (50000 UT) Caps capsule Commonly known as: DRISDOL Take by mouth every 7 (seven) days.       Past Medical History:  Diagnosis Date  . Allergic rhinitis   . Asthma   . Cancer of kidney ([place])   . COPD (chronic obstructive pulmonary disease) ([place])   . Dehydration   . GERD (gastroesophageal reflux disease)   . Hypothyroidism   . Kidney disease   . Rheumatoid arthritis ([place])   . Sleep apnea   . Tremor     Past Surgical History:  Procedure Laterality Date  . APPENDECTOMY    . CESAREAN SECTION     2 times  . CHOLECYSTECTOMY    . KIDNEY SURGERY    . SHOULDER SURGERY    . TONSILLECTOMY    . TOTAL SHOULDER ARTHROPLASTY    . TUBAL LIGATION      Review of systems negative except as noted in HPI / PMHx or noted below:  Review of Systems  Constitutional: Negative.   HENT: Negative.   Eyes: Negative.   Respiratory: Negative.   Cardiovascular: Negative.   Gastrointestinal: Negative.   Genitourinary: Negative.   Musculoskeletal: Negative.   Skin: Negative.   Neurological: Negative.   Endo/Heme/Allergies: Negative.    Psychiatric/Behavioral: Negative.      Objective:   Vitals:   03/13/19 1535  BP: 122/74  Pulse: 94  Resp: 18  SpO2: 95%          Physical Exam Constitutional:      Appearance: She is not diaphoretic.  HENT:     Head: Normocephalic.     Right Ear: Tympanic membrane, ear canal and external ear normal.     Left Ear: Tympanic membrane, ear canal and external ear normal.     Nose: Nose normal. No mucosal edema or rhinorrhea.     Mouth/Throat:     Pharynx: Uvula midline. No oropharyngeal exudate.  Eyes:     Conjunctiva/sclera: Conjunctivae normal.  Neck:     Thyroid: No thyromegaly.  Trachea: Trachea normal. No tracheal tenderness or tracheal deviation.  Cardiovascular:     Rate and Rhythm: Normal rate and regular rhythm.     Heart sounds: Normal heart sounds, S1 normal and S2 normal. No murmur.  Pulmonary:     Effort: No respiratory distress.     Breath sounds: Normal breath sounds. No stridor. No wheezing or rales.  Lymphadenopathy:     Head:     Right side of head: No tonsillar adenopathy.     Left side of head: No tonsillar adenopathy.     Cervical: No cervical adenopathy.  Skin:    Findings: No erythema or rash.     Nails: There is no clubbing.   Neurological:     Mental Status: She is alert.     Diagnostics:    Spirometry was performed and demonstrated an FEV1 of 1.62 at 67 % of predicted.  Assessment and Plan:   1. Asthma, severe persistent, well-controlled   2. Other allergic rhinitis   3. LPRD (laryngopharyngeal reflux disease)     1. Continue Breo and Spiriva as prescribed by Dr. Alcide Clever  2. Continue Omeprazole 40 one tablet two times per day + Pepcid in evening  3. Continue Montelukast - 10 one tablet one time per day  4. Continue Zyrtec 10 one tablet one time per day  6. Use Duoneb nebulization or Combivent respimat two puffs every 4-6 hours if needed.  7. Continue xolair and epi-pen  8. Return in 6 months or earlier if problem  9.  Obtain fall flu vaccine (and COVID vaccine)  Kimberly Jenkins appears to be stable at this point on a very large collection of medical therapy directed against respiratory tract inflammation including the use of omalizumab and therapy directed against reflux.  I will continue to have her use this plan and see her back in this clinic in 6 months or earlier if there is a problem.  Allena Katz, MD Allergy / Immunology [place]

## 2019-03-17 ENCOUNTER — Ambulatory Visit: Payer: Self-pay

## 2019-03-17 ENCOUNTER — Encounter: Payer: Self-pay | Admitting: Allergy and Immunology

## 2019-03-18 ENCOUNTER — Encounter: Payer: Self-pay | Admitting: Physician Assistant

## 2019-03-18 ENCOUNTER — Other Ambulatory Visit: Payer: Self-pay

## 2019-03-18 ENCOUNTER — Ambulatory Visit (INDEPENDENT_AMBULATORY_CARE_PROVIDER_SITE_OTHER): Payer: Medicare Other | Admitting: Rheumatology

## 2019-03-18 VITALS — BP 115/73 | HR 82 | Resp 12 | Ht 61.0 in | Wt 200.0 lb

## 2019-03-18 DIAGNOSIS — Z85528 Personal history of other malignant neoplasm of kidney: Secondary | ICD-10-CM

## 2019-03-18 DIAGNOSIS — J449 Chronic obstructive pulmonary disease, unspecified: Secondary | ICD-10-CM

## 2019-03-18 DIAGNOSIS — R768 Other specified abnormal immunological findings in serum: Secondary | ICD-10-CM

## 2019-03-18 DIAGNOSIS — F5101 Primary insomnia: Secondary | ICD-10-CM

## 2019-03-18 DIAGNOSIS — Z9889 Other specified postprocedural states: Secondary | ICD-10-CM | POA: Diagnosis not present

## 2019-03-18 DIAGNOSIS — E785 Hyperlipidemia, unspecified: Secondary | ICD-10-CM

## 2019-03-18 DIAGNOSIS — M0609 Rheumatoid arthritis without rheumatoid factor, multiple sites: Secondary | ICD-10-CM | POA: Diagnosis not present

## 2019-03-18 DIAGNOSIS — Z8719 Personal history of other diseases of the digestive system: Secondary | ICD-10-CM

## 2019-03-18 DIAGNOSIS — Z79899 Other long term (current) drug therapy: Secondary | ICD-10-CM

## 2019-03-18 DIAGNOSIS — M797 Fibromyalgia: Secondary | ICD-10-CM

## 2019-03-18 DIAGNOSIS — Z8639 Personal history of other endocrine, nutritional and metabolic disease: Secondary | ICD-10-CM

## 2019-03-18 NOTE — Patient Instructions (Addendum)
Standing Labs We placed an order today for your standing lab work.    Please come back and get your standing labs in October and every 3 months  We have open lab daily Monday through Thursday from 8:30-12:30 PM and 1:30-4:30 PM and Friday from 8:30-12:30 PM and 1:30 -4:00 PM at the office of Dr. Bo Merino.   You may experience shorter wait times on Monday and Friday afternoons. The office is located at [address], [place], [place], [place] [address] No appointment is necessary.   Labs are drawn by [facility].  You may receive a bill from [facility] for your lab work.  If you wish to have your labs drawn at another location, please call the office 24 hours in advance to send orders.  If you have any questions regarding directions or hours of operation,  please call [phone].   Just as a reminder please drink plenty of water prior to coming for your lab work. Thanks!  Back Exercises The following exercises strengthen the muscles that help to support the trunk and back. They also help to keep the lower back flexible. Doing these exercises can help to prevent back pain or lessen existing pain.  If you have back pain or discomfort, try doing these exercises 2-3 times each day or as told by your health care provider.  As your pain improves, do them once each day, but increase the number of times that you repeat the steps for each exercise (do more repetitions).  To prevent the recurrence of back pain, continue to do these exercises once each day or as told by your health care provider. Do exercises exactly as told by your health care provider and adjust them as directed. It is normal to feel mild stretching, pulling, tightness, or discomfort as you do these exercises, but you should stop right away if you feel sudden pain or your pain gets worse. Exercises Single knee to chest Repeat these steps 3-5 times for each leg: 1. Lie on your back on a firm bed or the floor with your legs  extended. 2. Bring one knee to your chest. Your other leg should stay extended and in contact with the floor. 3. Hold your knee in place by grabbing your knee or thigh with both hands and hold. 4. Pull on your knee until you feel a gentle stretch in your lower back or buttocks. 5. Hold the stretch for 10-30 seconds. 6. Slowly release and straighten your leg. Pelvic tilt Repeat these steps 5-10 times: 1. Lie on your back on a firm bed or the floor with your legs extended. 2. Bend your knees so they are pointing toward the ceiling and your feet are flat on the floor. 3. Tighten your lower abdominal muscles to press your lower back against the floor. This motion will tilt your pelvis so your tailbone points up toward the ceiling instead of pointing to your feet or the floor. 4. With gentle tension and even breathing, hold this position for 5-10 seconds. Cat-cow Repeat these steps until your lower back becomes more flexible: 1. Get into a hands-and-knees position on a firm surface. Keep your hands under your shoulders, and keep your knees under your hips. You may place padding under your knees for comfort. 2. Let your head hang down toward your chest. Contract your abdominal muscles and point your tailbone toward the floor so your lower back becomes rounded like the back of a cat. 3. Hold this position for 5 seconds. 4.  Slowly lift your head, let your abdominal muscles relax and point your tailbone up toward the ceiling so your back forms a sagging arch like the back of a cow. 5. Hold this position for 5 seconds.  Press-ups Repeat these steps 5-10 times: 1. Lie on your abdomen (face-down) on the floor. 2. Place your palms near your head, about shoulder-width apart. 3. Keeping your back as relaxed as possible and keeping your hips on the floor, slowly straighten your arms to raise the top half of your body and lift your shoulders. Do not use your back muscles to raise your upper torso. You may  adjust the placement of your hands to make yourself more comfortable. 4. Hold this position for 5 seconds while you keep your back relaxed. 5. Slowly return to lying flat on the floor.  Bridges Repeat these steps 10 times: 1. Lie on your back on a firm surface. 2. Bend your knees so they are pointing toward the ceiling and your feet are flat on the floor. Your arms should be flat at your sides, next to your body. 3. Tighten your buttocks muscles and lift your buttocks off the floor until your waist is at almost the same height as your knees. You should feel the muscles working in your buttocks and the back of your thighs. If you do not feel these muscles, slide your feet 1-2 inches farther away from your buttocks. 4. Hold this position for 3-5 seconds. 5. Slowly lower your hips to the starting position, and allow your buttocks muscles to relax completely. If this exercise is too easy, try doing it with your arms crossed over your chest. Abdominal crunches Repeat these steps 5-10 times: 1. Lie on your back on a firm bed or the floor with your legs extended. 2. Bend your knees so they are pointing toward the ceiling and your feet are flat on the floor. 3. Cross your arms over your chest. 4. Tip your chin slightly toward your chest without bending your neck. 5. Tighten your abdominal muscles and slowly raise your trunk (torso) high enough to lift your shoulder blades a tiny bit off the floor. Avoid raising your torso higher than that because it can put too much stress on your low back and does not help to strengthen your abdominal muscles. 6. Slowly return to your starting position. Back lifts Repeat these steps 5-10 times: 1. Lie on your abdomen (face-down) with your arms at your sides, and rest your forehead on the floor. 2. Tighten the muscles in your legs and your buttocks. 3. Slowly lift your chest off the floor while you keep your hips pressed to the floor. Keep the back of your head in  line with the curve in your back. Your eyes should be looking at the floor. 4. Hold this position for 3-5 seconds. 5. Slowly return to your starting position. Contact a health care provider if:  Your back pain or discomfort gets much worse when you do an exercise.  Your worsening back pain or discomfort does not lessen within 2 hours after you exercise. If you have any of these problems, stop doing these exercises right away. Do not do them again unless your health care provider says that you can. Get help right away if:  You develop sudden, severe back pain. If this happens, stop doing the exercises right away. Do not do them again unless your health care provider says that you can. This information is not intended to replace advice given  to you by your health care provider. Make sure you discuss any questions you have with your health care provider. Document Released: 09/21/2004 Document Revised: 12/19/2018 Document Reviewed: 05/16/2018 Elsevier Patient Education  2020 [employer].

## 2019-03-19 LAB — CBC WITH DIFFERENTIAL/PLATELET
Absolute Monocytes: 728 cells/uL (ref 200–950)
Basophils Absolute: 68 cells/uL (ref 0–200)
Basophils Relative: 1 %
Eosinophils Absolute: 313 cells/uL (ref 15–500)
Eosinophils Relative: 4.6 %
HCT: 40 % (ref 35.0–45.0)
Hemoglobin: 12.9 g/dL (ref 11.7–15.5)
Lymphs Abs: 1986 cells/uL (ref 850–3900)
MCH: 27.4 pg (ref 27.0–33.0)
MCHC: 32.3 g/dL (ref 32.0–36.0)
MCV: 85.1 fL (ref 80.0–100.0)
MPV: 10.4 fL (ref 7.5–12.5)
Monocytes Relative: 10.7 %
Neutro Abs: 3706 cells/uL (ref 1500–7800)
Neutrophils Relative %: 54.5 %
Platelets: 263 10*3/uL (ref 140–400)
RBC: 4.7 10*6/uL (ref 3.80–5.10)
RDW: 13.6 % (ref 11.0–15.0)
Total Lymphocyte: 29.2 %
WBC: 6.8 10*3/uL (ref 3.8–10.8)

## 2019-03-19 LAB — COMPLETE METABOLIC PANEL WITH GFR
AG Ratio: 1.4 (calc) (ref 1.0–2.5)
ALT: 11 U/L (ref 6–29)
AST: 15 U/L (ref 10–35)
Albumin: 4 g/dL (ref 3.6–5.1)
Alkaline phosphatase (APISO): 97 U/L (ref 37–153)
BUN: 11 mg/dL (ref 7–25)
CO2: 26 mmol/L (ref 20–32)
Calcium: 9 mg/dL (ref 8.6–10.4)
Chloride: 107 mmol/L (ref 98–110)
Creat: 0.92 mg/dL (ref 0.50–1.05)
GFR, Est African American: 81 mL/min/{1.73_m2} (ref >=60)
GFR, Est Non African American: 70 mL/min/{1.73_m2} (ref >=60)
Globulin: 2.8 g/dL (calc) (ref 1.9–3.7)
Glucose, Bld: 85 mg/dL (ref 65–99)
Potassium: 4 mmol/L (ref 3.5–5.3)
Sodium: 141 mmol/L (ref 135–146)
Total Bilirubin: 0.4 mg/dL (ref 0.2–1.2)
Total Protein: 6.8 g/dL (ref 6.1–8.1)

## 2019-03-29 ENCOUNTER — Encounter: Payer: Self-pay | Admitting: Allergy and Immunology

## 2019-04-03 ENCOUNTER — Other Ambulatory Visit: Payer: Self-pay | Admitting: Rheumatology

## 2019-04-03 NOTE — Telephone Encounter (Signed)
Last Visit: 03/18/19 Next Visit: 08/19/19 Labs: 03/18/19 WNL TB Gold: 09/23/18 WNL  Okay to refill per Dr. Estanislado Pandy

## 2019-04-10 MED FILL — HUMIRA PEN 40 MG/0.8ML PNKT: 40 | 28 days supply | Qty: 2 | Fill #0

## 2019-04-17 DIAGNOSIS — G5621 Lesion of ulnar nerve, right upper limb: Secondary | ICD-10-CM | POA: Insufficient documentation

## 2019-04-17 DIAGNOSIS — G5601 Carpal tunnel syndrome, right upper limb: Secondary | ICD-10-CM | POA: Insufficient documentation

## 2019-04-17 HISTORY — DX: Carpal tunnel syndrome, right upper limb: G56.01

## 2019-05-12 ENCOUNTER — Ambulatory Visit (INDEPENDENT_AMBULATORY_CARE_PROVIDER_SITE_OTHER): Payer: Medicare Other | Admitting: *Deleted

## 2019-05-12 DIAGNOSIS — J4550 Severe persistent asthma, uncomplicated: Secondary | ICD-10-CM

## 2019-05-26 ENCOUNTER — Other Ambulatory Visit: Payer: Self-pay

## 2019-05-26 ENCOUNTER — Ambulatory Visit (INDEPENDENT_AMBULATORY_CARE_PROVIDER_SITE_OTHER): Payer: Medicare Other | Admitting: *Deleted

## 2019-05-26 DIAGNOSIS — J4550 Severe persistent asthma, uncomplicated: Secondary | ICD-10-CM

## 2019-07-07 MED FILL — HUMIRA PEN 40 MG/0.8ML PNKT: 40 | 28 days supply | Qty: 2 | Fill #1

## 2019-07-31 ENCOUNTER — Other Ambulatory Visit: Payer: Self-pay | Admitting: Rheumatology

## 2019-07-31 DIAGNOSIS — Z79899 Other long term (current) drug therapy: Secondary | ICD-10-CM

## 2019-07-31 NOTE — Telephone Encounter (Signed)
Last Visit: 03/18/2019 Next Visit: 08/19/2019 Labs: 03/18/2019 WNL  TB Gold: 09/23/2018 negative   Advised patient she is due to update labs, patient verbalized understanding and will go to [facility] this afternoon. Orders have been released.   Okay to refill 30 day supply, per Dr. Estanislado Pandy.

## 2019-08-07 ENCOUNTER — Other Ambulatory Visit: Payer: Self-pay

## 2019-08-07 DIAGNOSIS — Z79899 Other long term (current) drug therapy: Secondary | ICD-10-CM

## 2019-08-08 NOTE — Progress Notes (Signed)
TB Gold is pending.  Please advise patient that her glucose is elevated.  Creatinine is also mildly increased but is stable.  Please forward labs to her PCP.

## 2019-08-10 LAB — CMP14+EGFR
ALT: 15 IU/L (ref 0–32)
AST: 13 IU/L (ref 0–40)
Albumin/Globulin Ratio: 1.7 (ref 1.2–2.2)
Albumin: 4.2 g/dL (ref 3.8–4.9)
Alkaline Phosphatase: 160 IU/L — ABNORMAL HIGH (ref 39–117)
BUN/Creatinine Ratio: 14 (ref 9–23)
BUN: 14 mg/dL (ref 6–24)
Bilirubin Total: 0.3 mg/dL (ref 0.0–1.2)
CO2: 27 mmol/L (ref 20–29)
Calcium: 9.1 mg/dL (ref 8.7–10.2)
Chloride: 101 mmol/L (ref 96–106)
Creatinine, Ser: 1.01 mg/dL — ABNORMAL HIGH (ref 0.57–1.00)
GFR calc Af Amer: 72 mL/min/{1.73_m2} (ref >59)
GFR calc non Af Amer: 63 mL/min/{1.73_m2} (ref >59)
Globulin, Total: 2.5 g/dL (ref 1.5–4.5)
Glucose: 287 mg/dL — ABNORMAL HIGH (ref 65–99)
Potassium: 4.1 mmol/L (ref 3.5–5.2)
Sodium: 136 mmol/L (ref 134–144)
Total Protein: 6.7 g/dL (ref 6.0–8.5)

## 2019-08-10 LAB — CBC WITH DIFFERENTIAL/PLATELET
Basophils Absolute: 0 10*3/uL (ref 0.0–0.2)
Basos: 1 %
EOS (ABSOLUTE): 0.4 10*3/uL (ref 0.0–0.4)
Eos: 5 %
Hematocrit: 41.3 % (ref 34.0–46.6)
Hemoglobin: 13.4 g/dL (ref 11.1–15.9)
Lymphocytes Absolute: 2 10*3/uL (ref 0.7–3.1)
Lymphs: 27 %
MCH: 28.5 pg (ref 26.6–33.0)
MCHC: 32.4 g/dL (ref 31.5–35.7)
MCV: 88 fL (ref 79–97)
Monocytes Absolute: 0.8 10*3/uL (ref 0.1–0.9)
Monocytes: 10 %
Neutrophils Absolute: 4.2 10*3/uL (ref 1.4–7.0)
Neutrophils: 57 %
Platelets: 245 10*3/uL (ref 150–450)
RBC: 4.71 x10E6/uL (ref 3.77–5.28)
RDW: 15.1 % (ref 11.7–15.4)
WBC: 7.4 10*3/uL (ref 3.4–10.8)

## 2019-08-10 LAB — QUANTIFERON-TB GOLD PLUS

## 2019-08-11 ENCOUNTER — Telehealth: Payer: Self-pay | Admitting: Pharmacy Technician

## 2019-08-11 NOTE — Progress Notes (Signed)
TB Gold was canceled due to an adequate specimen.  We will have to send patient again for the TB Gold test.

## 2019-08-11 NOTE — Telephone Encounter (Signed)
Received notification from [facility] regarding a prior authorization for [place]. Authorization has been APPROVED from 08/11/19 to 08/27/20.   Will send document to scan center.  Authorization # [id number] Phone # [phone]

## 2019-08-11 NOTE — Telephone Encounter (Signed)
Submitted a Prior Authorization request to [facility] for [place] via Cover My Meds. Will update once we receive a response.  2:02 PM Beatriz Chancellor, CPhT

## 2019-08-14 ENCOUNTER — Other Ambulatory Visit: Payer: Self-pay

## 2019-08-14 ENCOUNTER — Encounter: Payer: Self-pay | Admitting: Rheumatology

## 2019-08-14 ENCOUNTER — Telehealth (INDEPENDENT_AMBULATORY_CARE_PROVIDER_SITE_OTHER): Payer: Medicare Other | Admitting: Rheumatology

## 2019-08-14 DIAGNOSIS — J449 Chronic obstructive pulmonary disease, unspecified: Secondary | ICD-10-CM

## 2019-08-14 DIAGNOSIS — Z8639 Personal history of other endocrine, nutritional and metabolic disease: Secondary | ICD-10-CM

## 2019-08-14 DIAGNOSIS — E785 Hyperlipidemia, unspecified: Secondary | ICD-10-CM

## 2019-08-14 DIAGNOSIS — M0609 Rheumatoid arthritis without rheumatoid factor, multiple sites: Secondary | ICD-10-CM | POA: Diagnosis not present

## 2019-08-14 DIAGNOSIS — R7689 Other specified abnormal immunological findings in serum: Secondary | ICD-10-CM

## 2019-08-14 DIAGNOSIS — F5101 Primary insomnia: Secondary | ICD-10-CM

## 2019-08-14 DIAGNOSIS — Z79899 Other long term (current) drug therapy: Secondary | ICD-10-CM | POA: Diagnosis not present

## 2019-08-14 DIAGNOSIS — R768 Other specified abnormal immunological findings in serum: Secondary | ICD-10-CM

## 2019-08-14 DIAGNOSIS — M25561 Pain in right knee: Secondary | ICD-10-CM

## 2019-08-14 DIAGNOSIS — Z85528 Personal history of other malignant neoplasm of kidney: Secondary | ICD-10-CM

## 2019-08-14 DIAGNOSIS — Z9889 Other specified postprocedural states: Secondary | ICD-10-CM | POA: Diagnosis not present

## 2019-08-14 DIAGNOSIS — Z8719 Personal history of other diseases of the digestive system: Secondary | ICD-10-CM

## 2019-08-14 DIAGNOSIS — M797 Fibromyalgia: Secondary | ICD-10-CM

## 2019-08-14 DIAGNOSIS — R296 Repeated falls: Secondary | ICD-10-CM

## 2019-08-14 MED FILL — HUMIRA PEN 40 MG/0.8ML PNKT: 40 | 28 days supply | Qty: 2 | Fill #0

## 2019-08-14 NOTE — Progress Notes (Signed)
Virtual Visit via Telephone Note  I connected with Kimberly Jenkins on 08/14/19 at  1:00 PM EST by telephone and verified that I am speaking with the correct person using two identifiers.  Location: Patient: Home Provider: Clinic  This service was conducted via virtual visit.  Both audio and visual tools were used.  The patient was located at home. I was located in my office.  Consent was obtained prior to the virtual visit and is aware of possible charges through their insurance for this visit.  The patient is an established patient.  Dr. Estanislado Pandy, MD conducted the virtual visit and Hazel Sams, PA-C acted as scribed during the service.  Office staff helped with scheduling follow up visits after the service was conducted.   I discussed the limitations, risks, security and privacy concerns of performing an evaluation and management service by telephone and the availability of in person appointments. I also discussed with the patient that there may be a patient responsible charge related to this service. The patient expressed understanding and agreed to proceed.  CC: Right knee joint pain  History of Present Illness: Patient is a 53 year old female with past medical history seronegative rheumatoid arthritis.  She is on Humira 40 mg sq injections every 14 day and Arava 20 mg 1 tablet daily. She has been experiencing right knee joint pain and inflammation for the past 3 weeks. She has ongoing pain in the right elbow and hand s/p cubital tunnel and carpal tunnel release performed by Dr. Burney Gauze in September 2020. She has had several falls recently, which she feels is exacerbating the discomfort in the right knee and right wrist joint.   Review of Systems  Constitutional: Positive for malaise/fatigue. Negative for fever.  HENT:       +Dry mouth  Eyes: Negative for photophobia, pain, discharge and redness.  Respiratory: Negative for cough, shortness of breath and wheezing.   Cardiovascular:  Negative for chest pain and palpitations.  Gastrointestinal: Negative for blood in stool, constipation and diarrhea.  Genitourinary: Negative for dysuria.  Musculoskeletal: Positive for joint pain. Negative for back pain, myalgias and neck pain.       +Joint swelling +Morning stiffness   Skin: Negative for rash.  Neurological: Negative for dizziness and headaches.  Psychiatric/Behavioral: Negative for depression. The patient has insomnia. The patient is not nervous/anxious.       Observations/Objective: Physical Exam  Constitutional: She is oriented to person, place, and time.  Neurological: She is alert and oriented to person, place, and time.  Psychiatric: Mood, memory, affect and judgment normal.   Patient reports morning stiffness for 10-15   minutes.   Patient reports nocturnal pain.  Difficulty dressing/grooming: Denies Difficulty climbing stairs: Reports Difficulty getting out of chair: Reports Difficulty using hands for taps, buttons, cutlery, and/or writing: Reports  She rates her RA a 3/10 currently.   Assessment and Plan: Visit Diagnoses: Rheumatoid arthritis of multiple sites with negative rheumatoid factor: She has not had any recent rheumatoid arthritis flares.  She is on Humira 40 mg sq injections every 14 days and Arava 20 mg 1 tablet daily.  She has not missed any doses recently. She has been experiencing right knee joint pain and joint swelling for the past 3 weeks, which she feels is exacerbated by a recent fall. She has been experiencing frequent falls. We will refer her to [facility] for further evaluation and treatment.  We will also place a referral to PT for lower extremity muscle strengthening  and fall prevention.  She will continue on Humira and Arava as prescribed. She does not need any refills at this time.  She was advised to notify us if she develops increased joint pain or joint swelling.  She will follow up in 3-4 months.   High risk medication use -  Humira 40 mg subcutaneous every other week(11/02/2016), Arava 20 mg by mouth daily (12/13/2016) (inadequate response to Enbrel, side effects from methotrexate). CBC and CMP were drawn on 08/07/19.  She will be due to update lab work in March and every 3 months. Order for TB gold placed today.   Acute pain of right knee: She has been experiencing right knee joint pain and swelling for the past 3 weeks, which she attributes to a recent fall.  We will refer her to an orthopedist at [facility] for further evaluation and treatment.   Frequent falls: She has been having more frequent falls recently.  A referral to PT will be placed today for lower extremity muscle strengthening and fall prevention.   Fibromyalgia -She has intermittent fibromyalgia flares.  She has chronic fatigue related to insomnia.  We discussed the importance of regular exercise and good sleep hygiene.   History of rotator cuff surgery - She had surgery on her right shoulder by Dr. Ronnie Derby in the past.   Primary insomnia -Underlying anxiety and depression which contributes to her insomnia.  Positive ANA (antinuclear antibody) - ANA 1:320 NS,AVISE Index -1.8 on 11/01/18.  She has no clinical features of autoimmune disease at this time.   Chronic midline lower back pain-She is not having any lower back pain at this time.  Other medical problems are listed as follows:  Dyslipidemia   History of diabetes mellitus   Chronic obstructive pulmonary disease, unspecified COPD type ([place])   History of hypothyroidism  History of renal cell carcinoma   History of gastroesophageal reflux (GERD)   Follow Up Instructions: She will follow up in 3-4 months.    I discussed the assessment and treatment plan with the patient. The patient was provided an opportunity to ask questions and all were answered. The patient agreed with the plan and demonstrated an understanding of the instructions.   The patient was advised to call back or  seek an in-person evaluation if the symptoms worsen or if the condition fails to improve as anticipated.  I provided 15 minutes of non-face-to-face time during this encounter.   Bo Merino, MD   Scribed by-  Hazel Sams, PA-C

## 2019-08-15 ENCOUNTER — Telehealth: Payer: Self-pay | Admitting: Rheumatology

## 2019-08-15 ENCOUNTER — Other Ambulatory Visit: Payer: Self-pay | Admitting: *Deleted

## 2019-08-15 DIAGNOSIS — G8929 Other chronic pain: Secondary | ICD-10-CM

## 2019-08-15 NOTE — Telephone Encounter (Signed)
LMOM for patient to call and schedule follow-up appointment due in 3-4 months (around 11/12/2019) for RA, Osteoarthritis.

## 2019-08-15 NOTE — Telephone Encounter (Signed)
-----   Message from Shona Needles, RT sent at 08/14/2019  3:52 PM EST ----- Regarding: 3-4 MONTH F/U

## 2019-08-19 ENCOUNTER — Ambulatory Visit: Payer: Medicare Other | Admitting: Rheumatology

## 2019-08-28 ENCOUNTER — Other Ambulatory Visit: Payer: Self-pay

## 2019-08-28 ENCOUNTER — Ambulatory Visit: Payer: Self-pay

## 2019-08-28 ENCOUNTER — Ambulatory Visit (INDEPENDENT_AMBULATORY_CARE_PROVIDER_SITE_OTHER): Payer: Medicare Other | Admitting: Orthopaedic Surgery

## 2019-08-28 ENCOUNTER — Encounter: Payer: Self-pay | Admitting: Orthopaedic Surgery

## 2019-08-28 VITALS — Ht 61.0 in | Wt 211.0 lb

## 2019-08-28 DIAGNOSIS — G8929 Other chronic pain: Secondary | ICD-10-CM

## 2019-08-28 DIAGNOSIS — M25561 Pain in right knee: Secondary | ICD-10-CM

## 2019-08-28 HISTORY — DX: Pain in right knee: M25.561

## 2019-08-28 NOTE — Progress Notes (Signed)
Office Visit Note   Patient: Kimberly Jenkins           Date of Birth: 12-11-1965           MRN: [id number] Visit Date: 08/28/2019              Requested by: Kimberly Merino, MD [address] [place],  [place] [address] PCP: Kimberly Gourd, NP   Assessment & Plan: Visit Diagnoses:  1. Chronic pain of right knee     Plan: Right knee pain with recurrent effusion.  Does have a history of rheumatoid arthritis being followed by Kimberly Jenkins with Humira.  I am concerned that she may have some other pathology as her films are relatively benign and would like to obtain an MRI scan as she has had chronic pain with a feeling of instability.  Concerned that she may have some other pathology i.e. meniscal tear with her falls  Follow-Up Instructions: Return After MRI right knee.   Orders:  Orders Placed This Encounter  Procedures  . XR KNEE 3 VIEW RIGHT  . MR Knee Right w/o contrast   No orders of the defined types were placed in this encounter.     Procedures: No procedures performed   Clinical Data: No additional findings.   Subjective: Chief Complaint  Patient presents with  . Right Knee - Pain  Patient presents today for right knee pain. Her knee has been hurting for years. She said that the pain has progressively worsened. Her pain is medial and lateral. She said that it larger than her other left knee. She has increased pain with going up stairs. No grinding, popping, or catching. She takes hydrocodone 10/325 four times daily. No previous knee surgery. Has a history of rheumatoid arthritis treated by Kimberly Jenkins with Humira.  Kimberly Jenkins has been applying ice and heat to her knee but still having recurrent pain and swelling.  She has had several falls in the last month with recurrent pain.  HPI  Review of Systems   Objective: Vital Signs: Ht 5\' 1"  (1.549 m)   Wt 211 lb (95.7 kg)   BMI 39.87 kg/m   Physical Exam Constitutional:    Appearance: She is well-developed.  Eyes:     Pupils: Pupils are equal, round, and reactive to light.  Pulmonary:     Effort: Pulmonary effort is normal.  Skin:    General: Skin is warm and dry.  Neurological:     Mental Status: She is alert and oriented to person, place, and time.  Psychiatric:        Behavior: Behavior normal.     Ortho Exam awake alert and oriented x3.  Comfortable sitting positive effusion right knee with mild increased heat.  Does have some mild medial lateral joint pain.  Full extension actively.  Some patellar crepitation with minimal discomfort with compression.  No instability.  Slight fullness in the popliteal space but no obvious mass.  No distal edema.  Neurologically intact Specialty Comments:  No specialty comments available.  Imaging: XR KNEE 3 VIEW RIGHT  Result Date: 08/28/2019 Films of the right knee were obtained in 3 projections standing.  No significant osteoarthritic changes i.e. subchondral sclerosis or osteophyte formation.  Normal alignment.  Joint spaces are well maintained.    PMFS History: Patient Active Problem List   Diagnosis Date Noted  . Pain in right knee 08/28/2019  . Fibromyalgia 08/27/2016  . History of right rotator cuff tear repair 08/27/2016  .  Diabetes 08/27/2016  . Chronic obstructive pulmonary disease ([place]) 08/27/2016  . Dyslipidemia 08/27/2016  . History of renal cell carcinoma 08/27/2016  . Anxiety 08/27/2016  . Sleep apnea 08/27/2016  . Rheumatoid arthritis of multiple sites with negative rheumatoid factor  07/18/2016  . High risk medication use 07/18/2016  . Hand pain 07/13/2016  . Foot pain 07/13/2016  . Elevated sed rate 07/13/2016  . Severe persistent asthma 04/30/2015  . Other allergic rhinitis 04/30/2015  . GERD (gastroesophageal reflux disease) 04/30/2015   Past Medical History:  Diagnosis Date  . Allergic rhinitis   . Asthma   . Cancer of kidney ([place])   . COPD (chronic obstructive pulmonary  disease) ([place])   . Dehydration   . GERD (gastroesophageal reflux disease)   . Hypothyroidism   . Kidney disease   . Rheumatoid arthritis ([place])   . Sleep apnea   . Tremor     Family History  Problem Relation Age of Onset  . Brain cancer Brother   . Asthma Mother   . Lung cancer Mother   . Asthma Father   . Allergic rhinitis Father   . COPD Father   . Heart attack Father   . Brain cancer Paternal Uncle   . Asthma Maternal Grandmother   . Brain cancer Paternal Grandmother   . Heart attack Brother     Past Surgical History:  Procedure Laterality Date  . APPENDECTOMY    . CESAREAN SECTION     2 times  . CHOLECYSTECTOMY    . KIDNEY SURGERY    . SHOULDER SURGERY    . TONSILLECTOMY    . TOTAL SHOULDER ARTHROPLASTY    . TUBAL LIGATION     Social History   Occupational History  . Not on file  Tobacco Use  . Smoking status: Never Smoker  . Smokeless tobacco: Never Used  Substance and Sexual Activity  . Alcohol use: No  . Drug use: No  . Sexual activity: Not on file

## 2019-08-29 HISTORY — PX: ESOPHAGEAL DILATION: SHX303

## 2019-08-29 HISTORY — PX: COLONOSCOPY W/ POLYPECTOMY: SHX1380

## 2019-09-08 ENCOUNTER — Encounter: Payer: Self-pay | Admitting: Allergy and Immunology

## 2019-09-10 ENCOUNTER — Ambulatory Visit: Payer: Medicare Other | Admitting: Family Medicine

## 2019-09-16 ENCOUNTER — Other Ambulatory Visit: Payer: Self-pay

## 2019-09-16 ENCOUNTER — Ambulatory Visit
Admission: RE | Admit: 2019-09-16 | Discharge: 2019-09-16 | Disposition: A | Discharge status: Home / Self Care | Payer: Medicare Other | Source: Ambulatory Visit | Attending: Orthopaedic Surgery | Admitting: Orthopaedic Surgery

## 2019-09-16 DIAGNOSIS — G8929 Other chronic pain: Secondary | ICD-10-CM

## 2019-09-18 ENCOUNTER — Other Ambulatory Visit: Payer: Self-pay

## 2019-09-18 ENCOUNTER — Encounter: Payer: Self-pay | Admitting: Orthopaedic Surgery

## 2019-09-18 ENCOUNTER — Ambulatory Visit (INDEPENDENT_AMBULATORY_CARE_PROVIDER_SITE_OTHER): Payer: Medicare Other | Admitting: Orthopaedic Surgery

## 2019-09-18 VITALS — Ht 61.0 in | Wt 211.0 lb

## 2019-09-18 DIAGNOSIS — M0609 Rheumatoid arthritis without rheumatoid factor, multiple sites: Secondary | ICD-10-CM | POA: Diagnosis not present

## 2019-09-18 DIAGNOSIS — G8929 Other chronic pain: Secondary | ICD-10-CM | POA: Diagnosis not present

## 2019-09-18 DIAGNOSIS — M25561 Pain in right knee: Secondary | ICD-10-CM

## 2019-09-18 NOTE — Progress Notes (Signed)
Office Visit Note   Patient: Kimberly Jenkins           Date of Birth: [birth year]           MRN: [id number] Visit Date: 09/18/2019              Requested by: Clancy Gourd, NP [address] [place],  [place] [address] PCP: Clancy Gourd, NP   Assessment & Plan: Visit Diagnoses:  1. Rheumatoid arthritis of multiple sites with negative rheumatoid factor    2. Chronic pain of right knee     Plan: MRI scan of right knee demonstrated intact ligaments and menisci.  There were moderate degenerative changes in all 3 compartments.  I believe that is the cause of her pain.  Long discussion regarding the diagnosis and treatment options.  She will follow up with Dr. Estanislado Pandy for her rheumatoid arthritis and continuing the Humira.  But I discussed physical therapy for strengthening and a spider brace.  She would like to try the brace.  Will apply this and then plan to see her back as needed.  Definitive treatment would be knee replacement but I do not think it is bad enough yet and her x-rays revealed minimal loss of joint space  Follow-Up Instructions: Return if symptoms worsen or fail to improve.   Orders:  No orders of the defined types were placed in this encounter.  No orders of the defined types were placed in this encounter.     Procedures: No procedures performed   Clinical Data: No additional findings.   Subjective: Chief Complaint  Patient presents with  . Right Knee - Pain    MRI results  Patient presents today for follow up on her right knee. She had an MRI on 09/16/2019 and is here today for those results.  HPI  Review of Systems   Objective: Vital Signs: Ht 5\' 1"  (1.549 m)   Wt 211 lb (95.7 kg)   BMI 39.87 kg/m   Physical Exam Constitutional:      Appearance: She is well-developed.  Eyes:     Pupils: Pupils are equal, round, and reactive to light.  Pulmonary:     Effort: Pulmonary effort is normal.  Skin:    General: Skin is warm and  dry.  Neurological:     Mental Status: She is alert and oriented to person, place, and time.  Psychiatric:        Behavior: Behavior normal.     Ortho Exam awake alert and oriented x3.  Comfortable sitting.  Right knee was not unstable.  Full extension.  I did not think she had an effusion today.  There was some patellar crepitation and mild pain with compression.  Also little bit of discomfort medially and laterally.  No popliteal discomfort. Specialty Comments:  No specialty comments available.  Imaging: No results found.   PMFS History: Patient Active Problem List   Diagnosis Date Noted  . Pain in right knee 08/28/2019  . Fibromyalgia 08/27/2016  . History of right rotator cuff tear repair 08/27/2016  . Diabetes 08/27/2016  . Chronic obstructive pulmonary disease ([place]) 08/27/2016  . Dyslipidemia 08/27/2016  . History of renal cell carcinoma 08/27/2016  . Anxiety 08/27/2016  . Sleep apnea 08/27/2016  . Rheumatoid arthritis of multiple sites with negative rheumatoid factor  07/18/2016  . High risk medication use 07/18/2016  . Hand pain 07/13/2016  . Foot pain 07/13/2016  . Elevated sed rate 07/13/2016  . Severe persistent  asthma 04/30/2015  . Other allergic rhinitis 04/30/2015  . GERD (gastroesophageal reflux disease) 04/30/2015   Past Medical History:  Diagnosis Date  . Allergic rhinitis   . Asthma   . Cancer of kidney ([place])   . COPD (chronic obstructive pulmonary disease) ([place])   . Dehydration   . GERD (gastroesophageal reflux disease)   . Hypothyroidism   . Kidney disease   . Rheumatoid arthritis ([place])   . Sleep apnea   . Tremor     Family History  Problem Relation Age of Onset  . Brain cancer Brother   . Asthma Mother   . Lung cancer Mother   . Asthma Father   . Allergic rhinitis Father   . COPD Father   . Heart attack Father   . Brain cancer Paternal Uncle   . Asthma Maternal Grandmother   . Brain cancer Paternal Grandmother   . Heart attack  Brother     Past Surgical History:  Procedure Laterality Date  . APPENDECTOMY    . CESAREAN SECTION     2 times  . CHOLECYSTECTOMY    . KIDNEY SURGERY    . SHOULDER SURGERY    . TONSILLECTOMY    . TOTAL SHOULDER ARTHROPLASTY    . TUBAL LIGATION     Social History   Occupational History  . Not on file  Tobacco Use  . Smoking status: Never Smoker  . Smokeless tobacco: Never Used  Substance and Sexual Activity  . Alcohol use: No  . Drug use: No  . Sexual activity: Not on file

## 2019-09-22 MED FILL — HUMIRA PEN 40 MG/0.8ML PNKT: 40 | 28 days supply | Qty: 2 | Fill #2

## 2019-09-25 ENCOUNTER — Telehealth: Payer: Self-pay | Admitting: Orthopaedic Surgery

## 2019-09-25 ENCOUNTER — Encounter: Payer: Self-pay | Admitting: Allergy and Immunology

## 2019-09-25 ENCOUNTER — Ambulatory Visit (INDEPENDENT_AMBULATORY_CARE_PROVIDER_SITE_OTHER): Payer: Medicare Other | Admitting: Allergy and Immunology

## 2019-09-25 ENCOUNTER — Other Ambulatory Visit: Payer: Self-pay

## 2019-09-25 VITALS — BP 118/62 | HR 76 | Resp 18 | Ht 61.0 in | Wt 218.8 lb

## 2019-09-25 DIAGNOSIS — J4550 Severe persistent asthma, uncomplicated: Secondary | ICD-10-CM | POA: Diagnosis not present

## 2019-09-25 MED ORDER — TRELEGY ELLIPTA 200-62.5-25 MCG/INH IN AEPB: 1.0000 | INHALATION_SPRAY | Freq: Every day | RESPIRATORY_TRACT | 5 refills | Status: DC

## 2019-09-25 NOTE — Telephone Encounter (Signed)
Faxed MRI results to number provided. Called patient. No answer. Left message that this had been done.

## 2019-09-25 NOTE — Progress Notes (Signed)
[name] [place] [facility] [place]   Follow-up Note  Referring Provider: Clancy Gourd, NP Primary Provider: Clancy Gourd, NP Date of Office Visit: 09/25/2019  Subjective:   Kimberly Jenkins (DOB: 1966-05-03) is a 54 y.o. female who returns to the [facility] on 09/25/2019 in re-evaluation of the following:  HPI: Kimberly Jenkins returns to this clinic in evaluation of severe asthma and allergic rhinitis and LPR.  I last saw her in this clinic on 13 March 2019.  She continues to have daily wheezing and coughing and must use a bronchodilator at least 1 time per day and she recently had an exacerbation of her asthma requiring the administration of a Z-Pak and prednisone.  There was no obvious provoking factor giving rise to that flareup.  She continues to utilize anti-inflammatory agents for her airway including the use of omalizumab injections.  She believes that her nose is doing well on her current plan.  She believes that her reflux is doing well on her current plan.  However, she apparently does have post prandial bloating for which she will be receiving an endoscopy and colonoscopy next week  She did receive the flu vaccine.  Allergies as of 09/25/2019      Reactions   Methotrexate Diarrhea   Advair Diskus [fluticasone-salmeterol]    Aspirin    Ciprofloxacin    Clonidine Derivatives    Nystatin    Oxycodone    Serevent [salmeterol]    Cephalexin Nausea And Vomiting      Medication List    BREO ELLIPTA IN Inhale 1 puff into the lungs daily.   busPIRone 15 MG tablet Commonly known as: BUSPAR Take one tablet three times daily.   cetirizine 10 MG tablet Commonly known as: ZYRTEC Take 10 mg by mouth daily.   clorazepate 3.75 MG tablet Commonly known as: TRANXENE Take one tablet four times a day as needed.   Combivent Respimat 20-100 MCG/ACT Aers respimat Generic drug: Ipratropium-Albuterol Inhale 1 puff into the lungs  every 6 (six) hours as needed for wheezing.   ipratropium-albuterol 0.5-2.5 (3) MG/3ML Soln Commonly known as: DUONEB USE ONE VIAL VIA NEBULIZER EVERY 4-6 HOURS AS NEEDED FOR COUGH OR WHEEZE   Easy Touch Pen Needles 31G X 8 MM Misc Generic drug: Insulin Pen Needle   EPINEPHrine 0.3 mg/0.3 mL Soaj injection Commonly known as: EpiPen 2-Pak Use as directed for life-threatening allergic reaction.   ezetimibe 10 MG tablet Commonly known as: ZETIA Take 10 mg by mouth daily.   famotidine 20 MG tablet Commonly known as: PEPCID Take 1 tablet by mouth at bedtime.   FISH OIL PO Take by mouth.   FLUoxetine 40 MG capsule Commonly known as: PROZAC Take 40 mg by mouth daily.   folic acid 1 MG tablet Commonly known as: FOLVITE TAKE 2 TABLETS BY MOUTH EVERY DAY   furosemide 40 MG tablet Commonly known as: LASIX Take 20 mg by mouth every other day.   gabapentin 400 MG capsule Commonly known as: NEURONTIN Take 600 mg by mouth 3 (three) times daily.   Humira Pen 40 MG/0.8ML Pnkt Generic drug: Adalimumab INJECT 40 MG INTO THE SKIN EVERY 14 DAYS.   HYDROcodone-acetaminophen 10-325 MG tablet Commonly known as: NORCO Take by mouth.   Lantus SoloStar 100 UNIT/ML Solostar Pen Generic drug: Insulin Glargine as needed.   leflunomide 10 MG tablet Commonly known as: ARAVA Take 2 tablets (20 mg total) by mouth daily.  levothyroxine 50 MCG tablet Commonly known as: SYNTHROID TAKE 1 TABLET BY MOUTH EVERY DAY AT 6 AM   metFORMIN 500 MG tablet Commonly known as: GLUCOPHAGE Take 500 mg by mouth 2 (two) times daily with a meal.   montelukast 10 MG tablet Commonly known as: SINGULAIR Take 1 tablet (10 mg total) by mouth daily.   nystatin cream Commonly known as: MYCOSTATIN as needed.   olopatadine 0.1 % ophthalmic solution Commonly known as: PATANOL   omalizumab 150 MG injection Commonly known as: Xolair Inject 375 mg into the skin every 14 (fourteen) days.   omeprazole 40  MG capsule Commonly known as: PRILOSEC Take 40 mg by mouth 2 (two) times daily.   potassium chloride SA 20 MEQ tablet Commonly known as: KLOR-CON Take by mouth.   pramipexole 0.125 MG tablet Commonly known as: MIRAPEX Take by mouth.   pravastatin 40 MG tablet Commonly known as: PRAVACHOL Take 40 mg by mouth daily.   progesterone 100 MG capsule Commonly known as: PROMETRIUM Take 200 mg by mouth daily.   Rexulti 0.5 MG Tabs Generic drug: Brexpiprazole TK 1 T PO D   rizatriptan 10 MG tablet Commonly known as: MAXALT Take 10 mg by mouth.   SPIRIVA RESPIMAT IN Inhale into the lungs.   topiramate 25 MG tablet Commonly known as: TOPAMAX Take 25 mg by mouth 2 (two) times daily.   Trelegy Ellipta 200-62.5-25 MCG/INH Aepb Generic drug: Fluticasone-Umeclidin-Vilant Inhale 1 puff into the lungs daily. Started by: Jiles Prows, MD   triamcinolone cream 0.1 % Commonly known as: KENALOG   VICTOZA [place] Inject into the skin.   Vitamin D (Ergocalciferol) 1.25 MG (50000 UNIT) Caps capsule Commonly known as: DRISDOL Take by mouth every 7 (seven) days.       Past Medical History:  Diagnosis Date  . Allergic rhinitis   . Asthma   . Cancer of kidney ([place])   . COPD (chronic obstructive pulmonary disease) ([place])   . Dehydration   . GERD (gastroesophageal reflux disease)   . Hypothyroidism   . Kidney disease   . Rheumatoid arthritis ([place])   . Sleep apnea   . Tremor     Past Surgical History:  Procedure Laterality Date  . APPENDECTOMY    . CESAREAN SECTION     2 times  . CHOLECYSTECTOMY    . KIDNEY SURGERY    . SHOULDER SURGERY    . TONSILLECTOMY    . TOTAL SHOULDER ARTHROPLASTY    . TUBAL LIGATION      Review of systems negative except as noted in HPI / PMHx or noted below:  Review of Systems  Constitutional: Negative.   HENT: Negative.   Eyes: Negative.   Respiratory: Negative.   Cardiovascular: Negative.   Gastrointestinal: Negative.   Genitourinary:  Negative.   Musculoskeletal: Negative.   Skin: Negative.   Neurological: Negative.   Endo/Heme/Allergies: Negative.   Psychiatric/Behavioral: Negative.      Objective:   Vitals:   09/25/19 1139  BP: 118/62  Pulse: 76  Resp: 18  SpO2: 94%   Height: 5\' 1"  (154.9 cm)  Weight: 218 lb 12.8 oz (99.2 kg)   Physical Exam Constitutional:      Appearance: She is not diaphoretic.  HENT:     Head: Normocephalic.     Right Ear: Tympanic membrane, ear canal and external ear normal.     Left Ear: Tympanic membrane, ear canal and external ear normal.     Nose: Nose normal. No  mucosal edema or rhinorrhea.     Mouth/Throat:     Pharynx: Uvula midline. No oropharyngeal exudate.  Eyes:     Conjunctiva/sclera: Conjunctivae normal.  Neck:     Thyroid: No thyromegaly.     Trachea: Trachea normal. No tracheal tenderness or tracheal deviation.  Cardiovascular:     Rate and Rhythm: Normal rate and regular rhythm.     Heart sounds: Normal heart sounds, S1 normal and S2 normal. No murmur.  Pulmonary:     Effort: No respiratory distress.     Breath sounds: No stridor. Wheezing (Bilateral end expiratory wheezes posterior lung fields) present. No rales.  Lymphadenopathy:     Head:     Right side of head: No tonsillar adenopathy.     Left side of head: No tonsillar adenopathy.     Cervical: No cervical adenopathy.  Skin:    Findings: No erythema or rash.     Nails: There is no clubbing.  Neurological:     Mental Status: She is alert.     Diagnostics:    Spirometry was performed and demonstrated an FEV1 of 1.47 at 61 % of predicted.  Results of blood tests obtained 07 August 2019 identified WBC 7.4, absolute eosinophil 400, absolute lymphocyte 2000, hemoglobin 13.4, platelet 245.  Assessment and Plan:   1. Asthma, severe persistent, well-controlled     1. Start Trelegy 200 - 1 inhalation 1 time per day (replaces Breo and Spiriva)  2. Change Xolair to Mepolizumab injections  (initiate this afternoon)  3. Continue Omeprazole 40 one tablet two times per day + Pepcid 40 mg in evening  4. Continue Montelukast - 10 one tablet one time per day  5. Continue Zyrtec 10 one tablet one time per day  6. Use Duoneb nebulization or ALBUTEROL HFA two puffs every 4-6 hours if needed.  7. Return to clinic in 12 weeks or earlier if problem  8. Obtain COVID vaccine when avaialble  We will now give Kimberly Jenkins a anti-IL-5 biologic agent to address her respiratory tract inflammation in the setting of eosinophilia to replace her omalizumab.  As well, I have altered her anti-inflammatory medications as noted above and she will continue to use therapy directed against reflux.  We will start her on mepolizumab as soon as possible.  I will see her back in this clinic in 12 weeks or earlier if there is a problem.  Allena Katz, MD Allergy / Immunology [place]

## 2019-09-25 NOTE — Patient Instructions (Addendum)
  1. Start Trelegy 200 - 1 inhalation 1 time per day (replaces Breo and Spiriva)  2. Change Xolair to Mepolizumab injections (initiate this afternoon)  3. Continue Omeprazole 40 one tablet two times per day + Pepcid 40 mg in evening  4. Continue Montelukast - 10 one tablet one time per day  5. Continue Zyrtec 10 one tablet one time per day  6. Use Duoneb nebulization or ALBUTEROL HFA two puffs every 4-6 hours if needed.  7. Return to clinic in 12 weeks or earlier if problem  8. Obtain COVID vaccine when avaialble

## 2019-09-25 NOTE — Telephone Encounter (Signed)
Pt called requesting her MRI results done on 09-16-19,  Be faxed to Dr. Charisse Klinefelter at the [facility] pain management center.   Pt # [phone]  Fax # [phone]

## 2019-09-29 ENCOUNTER — Encounter: Payer: Self-pay | Admitting: Allergy and Immunology

## 2019-10-02 ENCOUNTER — Other Ambulatory Visit: Payer: Self-pay | Admitting: Rheumatology

## 2019-10-02 ENCOUNTER — Other Ambulatory Visit: Payer: Self-pay

## 2019-10-02 ENCOUNTER — Ambulatory Visit (INDEPENDENT_AMBULATORY_CARE_PROVIDER_SITE_OTHER): Payer: Medicare Other | Admitting: *Deleted

## 2019-10-02 DIAGNOSIS — J4550 Severe persistent asthma, uncomplicated: Secondary | ICD-10-CM

## 2019-10-02 MED ORDER — MEPOLIZUMAB 100 MG ~~LOC~~ SOLR
100.0000 mg | SUBCUTANEOUS | Status: DC
  Administered 2019-10-02 – 2022-10-05 (×33): 100 mg via SUBCUTANEOUS

## 2019-10-02 NOTE — Telephone Encounter (Signed)
Last Visit: 08/14/2019 telemedicine Next Visit:  Message sent to the front desk to schedule.   Okay to refill per Dr. Estanislado Pandy.

## 2019-10-02 NOTE — Progress Notes (Signed)
Started Nucala today. Will receive 100 mg every 4 weeks. Has Epipen. Had no issues. Consent signed.

## 2019-10-08 ENCOUNTER — Ambulatory Visit: Payer: Self-pay

## 2019-10-15 ENCOUNTER — Other Ambulatory Visit: Payer: Self-pay | Admitting: Rheumatology

## 2019-10-15 DIAGNOSIS — Z9225 Personal history of immunosupression therapy: Secondary | ICD-10-CM

## 2019-10-15 NOTE — Telephone Encounter (Signed)
Last Visit: 08/14/2019 telemedicine Next Visit: due March/April 2021 Labs: 08/07/19 glucose is elevated. Creatinine is also mildly increased but is stable.  TB Gold: 09/23/18 Neg   Patient advised she will need to have her TB Gold redrawn. Patient will come to the office 10/20/19 to have it drawn.   Okay to refill 30 day supply per Dr. Estanislado Pandy

## 2019-10-23 MED FILL — HUMIRA PEN 40 MG/0.8ML PNKT: 40 | 28 days supply | Qty: 2 | Fill #0

## 2019-10-30 ENCOUNTER — Ambulatory Visit (INDEPENDENT_AMBULATORY_CARE_PROVIDER_SITE_OTHER): Payer: Medicare Other | Admitting: *Deleted

## 2019-10-30 ENCOUNTER — Other Ambulatory Visit: Payer: Self-pay

## 2019-10-30 DIAGNOSIS — J4550 Severe persistent asthma, uncomplicated: Secondary | ICD-10-CM

## 2019-11-07 NOTE — Progress Notes (Signed)
Office Visit Note  Patient: Kimberly Jenkins             Date of Birth: Dec 28, 1965           MRN: [id number]             PCP: Clancy Gourd, NP Referring: Clancy Gourd, NP Visit Date: 11/11/2019 Occupation: @GUAROCC @  Subjective:  Other (increased pain, right hip pain )   History of Present Illness: Kimberly Jenkins is a 54 y.o. female history of rheumatoid arthritis and fibromyalgia.  She states her rheumatoid arthritis is well controlled.  She denies any joint swelling.  She has been having discomfort in her right trochanteric area.  She also describes generalized pain all over.  She is sleeping well.  She also complains of hyperalgesia.  Activities of Daily Living:  Patient reports morning stiffness for 24 hours.   Patient Reports nocturnal pain.  Difficulty dressing/grooming: Reports Difficulty climbing stairs: Reports Difficulty getting out of chair: Reports Difficulty using hands for taps, buttons, cutlery, and/or writing: Denies  Review of Systems  Constitutional: Positive for fatigue. Negative for night sweats, weight gain and weight loss.  HENT: Positive for mouth dryness. Negative for mouth sores, trouble swallowing, trouble swallowing and nose dryness.   Eyes: Negative for pain, redness, itching, visual disturbance and dryness.  Respiratory: Negative for cough, shortness of breath and difficulty breathing.   Cardiovascular: Positive for swelling in legs/feet. Negative for chest pain, palpitations, hypertension and irregular heartbeat.  Gastrointestinal: Negative for blood in stool, constipation and diarrhea.  Endocrine: Negative for increased urination.  Genitourinary: Negative for difficulty urinating, painful urination and vaginal dryness.  Musculoskeletal: Positive for arthralgias, joint pain, morning stiffness and muscle tenderness. Negative for joint swelling, myalgias, muscle weakness and myalgias.  Skin: Negative for color change, rash, hair  loss, skin tightness, ulcers and sensitivity to sunlight.  Allergic/Immunologic: Negative for susceptible to infections.  Neurological: Negative for dizziness, numbness, headaches, memory loss, night sweats and weakness.  Hematological: Positive for bruising/bleeding tendency. Negative for swollen glands.  Psychiatric/Behavioral: Negative for depressed mood, confusion and sleep disturbance. The patient is not nervous/anxious.     PMFS History:  Patient Active Problem List   Diagnosis Date Noted  . Pain in right knee 08/28/2019  . Fibromyalgia 08/27/2016  . History of right rotator cuff tear repair 08/27/2016  . Diabetes 08/27/2016  . Chronic obstructive pulmonary disease ([place]) 08/27/2016  . Dyslipidemia 08/27/2016  . History of renal cell carcinoma 08/27/2016  . Anxiety 08/27/2016  . Sleep apnea 08/27/2016  . Rheumatoid arthritis of multiple sites with negative rheumatoid factor  07/18/2016  . High risk medication use 07/18/2016  . Hand pain 07/13/2016  . Foot pain 07/13/2016  . Elevated sed rate 07/13/2016  . Severe persistent asthma 04/30/2015  . Other allergic rhinitis 04/30/2015  . GERD (gastroesophageal reflux disease) 04/30/2015    Past Medical History:  Diagnosis Date  . Allergic rhinitis   . Asthma   . Cancer of kidney ([place])   . COPD (chronic obstructive pulmonary disease) ([place])   . Dehydration   . GERD (gastroesophageal reflux disease)   . Hypothyroidism   . Kidney disease   . Rheumatoid arthritis ([place])   . Sleep apnea   . Tremor     Family History  Problem Relation Age of Onset  . Brain cancer Brother   . Asthma Mother   . Lung cancer Mother   . Asthma Father   . Allergic rhinitis Father   .  COPD Father   . Heart attack Father   . Arthritis Father   . Brain cancer Paternal Uncle   . Asthma Maternal Grandmother   . Brain cancer Paternal Grandmother   . Heart attack Brother    Past Surgical History:  Procedure Laterality Date  . APPENDECTOMY    .  CESAREAN SECTION     2 times  . CHOLECYSTECTOMY    . COLONOSCOPY W/ POLYPECTOMY  2021  . ESOPHAGEAL DILATION  2021  . KIDNEY SURGERY    . SHOULDER SURGERY    . TONSILLECTOMY    . TOTAL SHOULDER ARTHROPLASTY    . TUBAL LIGATION     Social History   Social History Narrative  . Not on file    There is no immunization history on file for this patient.   Objective: Vital Signs: BP 111/66 (BP Location: Left Arm, Patient Position: Sitting, Cuff Size: Normal)   Pulse 72   Resp 16   Ht 5\' 1"  (1.549 m)   Wt 224 lb (101.6 kg)   BMI 42.32 kg/m    Physical Exam Vitals and nursing note reviewed.  Constitutional:      Appearance: She is well-developed.  HENT:     Head: Normocephalic and atraumatic.  Eyes:     Conjunctiva/sclera: Conjunctivae normal.  Cardiovascular:     Rate and Rhythm: Normal rate and regular rhythm.     Heart sounds: Normal heart sounds.  Pulmonary:     Effort: Pulmonary effort is normal.     Breath sounds: Normal breath sounds.  Abdominal:     General: Bowel sounds are normal.     Palpations: Abdomen is soft.  Musculoskeletal:     Cervical back: Normal range of motion.  Lymphadenopathy:     Cervical: No cervical adenopathy.  Skin:    General: Skin is warm and dry.     Capillary Refill: Capillary refill takes less than 2 seconds.  Neurological:     Mental Status: She is alert and oriented to person, place, and time.  Psychiatric:        Behavior: Behavior normal.      Musculoskeletal Exam: C-spine thoracic and lumbar spine were in good range of motion.  Shoulder joints, elbow joints, wrist joints, MCPs PIPs and DIPs with good range of motion with no synovitis.  She has tenderness on palpation of right trochanteric bursa.  Hip joints, knee joints, ankles, MTPs and PIPs with good range of motion with no synovitis.  CDAI Exam: CDAI Score: 0.4  Patient Global: 2 mm; Provider Global: 2 mm Swollen: 0 ; Tender: 0  Joint Exam 11/11/2019   No joint exam  has been documented for this visit   There is currently no information documented on the homunculus. Go to the Rheumatology activity and complete the homunculus joint exam.  Investigation: No additional findings.  Imaging: No results found.  Recent Labs: Lab Results  Component Value Date   WBC 7.4 08/07/2019   HGB 13.4 08/07/2019   PLT 245 08/07/2019   NA 136 08/07/2019   K 4.1 08/07/2019   CL 101 08/07/2019   CO2 27 08/07/2019   GLUCOSE 287 (H) 08/07/2019   BUN 14 08/07/2019   CREATININE 1.01 (H) 08/07/2019   BILITOT 0.3 08/07/2019   ALKPHOS 160 (H) 08/07/2019   AST 13 08/07/2019   ALT 15 08/07/2019   PROT 6.7 08/07/2019   ALBUMIN 4.2 08/07/2019   CALCIUM 9.1 08/07/2019   GFRAA 72 08/07/2019   QFTBGOLD NEGATIVE  06/26/2017   QFTBGOLDPLUS CANCELED 08/07/2019    Speciality Comments: No specialty comments available.  Procedures:  Large Joint Inj: R greater trochanter on 11/11/2019 9:50 AM Indications: pain Details: 27 G 1.5 in needle, lateral approach  Arthrogram: No  Medications: 40 mg triamcinolone acetonide 40 MG/ML; 1.5 mL lidocaine 1 % Aspirate: 0 mL Outcome: tolerated well, no immediate complications Procedure, treatment alternatives, risks and benefits explained, specific risks discussed. Consent was given by the patient. Immediately prior to procedure a time out was called to verify the correct patient, procedure, equipment, support staff and site/side marked as required. Patient was prepped and draped in the usual sterile fashion.     Allergies: Methotrexate, Advair diskus [fluticasone-salmeterol], Aspirin, Ciprofloxacin, Clonidine derivatives, Nystatin, Oxycodone, Serevent [salmeterol], and Cephalexin   Assessment / Plan:     Visit Diagnoses: Rheumatoid arthritis of multiple sites with negative rheumatoid factor -patient had no active synovitis.  Her rheumatoid arthritis is well controlled.  She has been experiencing increased pain all over.  I will obtain  sed rate today.  High risk medication use - Humira 40 mg subcutaneous every other week(11/02/2016), Arava 20 mg by mouth daily (12/13/2016) (inadequate response to Enbrel, side effects from methotrexate).  Her labs are past due.  We will get labs today.  Fibromyalgia-she appears to be having a flare of fibromyalgia with generalized pain and hyperalgesia.  Trochanteric bursitis-she been having severe pain and discomfort in her right trochanter area.  She gives history of nocturnal pain.  After different treatment options were discussed per her request right trochanteric area was injected with cortisone as described above.  I have advised her to monitor her blood sugar closely.  She states she can adjust her blood sugar with insulin.  I have given her a handout on IT band exercises.  History of rotator cuff surgery  Primary insomnia-good sleep hygiene was discussed.  Positive ANA (antinuclear antibody)  Dyslipidemia  History of diabetes mellitus-I have advised her to monitor her blood sugar closely as she was getting cortisone injection.  She states she has insulin and she will adjust the dose accordingly.  Chronic obstructive pulmonary disease, unspecified COPD type ([place])  History of hypothyroidism  History of renal cell carcinoma  History of gastroesophageal reflux (GERD)  Postmenopausal, osteoporosis screen-we will schedule DEXA scan.  Orders: Orders Placed This Encounter  Procedures  . Large Joint Inj  . DG BONE DENSITY (DXA)  . CBC with Differential/Platelet  . COMPLETE METABOLIC PANEL WITH GFR  . QuantiFERON-TB Gold Plus  . Sedimentation rate   No orders of the defined types were placed in this encounter.   Face-to-face time spent with patient was 30 minutes. Greater than 50% of time was spent in counseling and coordination of care.  Follow-Up Instructions: Return in about 5 months (around 04/12/2020) for Rheumatoid arthritis,FMS.   Bo Merino, MD  Note - This  record has been created using Editor, commissioning.  Chart creation errors have been sought, but may not always  have been located. Such creation errors do not reflect on  the standard of medical care.

## 2019-11-11 ENCOUNTER — Encounter: Payer: Self-pay | Admitting: Rheumatology

## 2019-11-11 ENCOUNTER — Other Ambulatory Visit: Payer: Self-pay

## 2019-11-11 ENCOUNTER — Ambulatory Visit (INDEPENDENT_AMBULATORY_CARE_PROVIDER_SITE_OTHER): Payer: Medicare Other | Admitting: Rheumatology

## 2019-11-11 VITALS — BP 111/66 | HR 72 | Resp 16 | Ht 61.0 in | Wt 224.0 lb

## 2019-11-11 DIAGNOSIS — E785 Hyperlipidemia, unspecified: Secondary | ICD-10-CM

## 2019-11-11 DIAGNOSIS — Z9889 Other specified postprocedural states: Secondary | ICD-10-CM

## 2019-11-11 DIAGNOSIS — M797 Fibromyalgia: Secondary | ICD-10-CM

## 2019-11-11 DIAGNOSIS — J449 Chronic obstructive pulmonary disease, unspecified: Secondary | ICD-10-CM

## 2019-11-11 DIAGNOSIS — M7061 Trochanteric bursitis, right hip: Secondary | ICD-10-CM

## 2019-11-11 DIAGNOSIS — M0609 Rheumatoid arthritis without rheumatoid factor, multiple sites: Secondary | ICD-10-CM | POA: Diagnosis not present

## 2019-11-11 DIAGNOSIS — Z8719 Personal history of other diseases of the digestive system: Secondary | ICD-10-CM

## 2019-11-11 DIAGNOSIS — F5101 Primary insomnia: Secondary | ICD-10-CM

## 2019-11-11 DIAGNOSIS — Z13820 Encounter for screening for osteoporosis: Secondary | ICD-10-CM

## 2019-11-11 DIAGNOSIS — Z8639 Personal history of other endocrine, nutritional and metabolic disease: Secondary | ICD-10-CM

## 2019-11-11 DIAGNOSIS — Z780 Asymptomatic menopausal state: Secondary | ICD-10-CM

## 2019-11-11 DIAGNOSIS — Z85528 Personal history of other malignant neoplasm of kidney: Secondary | ICD-10-CM

## 2019-11-11 DIAGNOSIS — R768 Other specified abnormal immunological findings in serum: Secondary | ICD-10-CM

## 2019-11-11 DIAGNOSIS — Z79899 Other long term (current) drug therapy: Secondary | ICD-10-CM | POA: Diagnosis not present

## 2019-11-11 MED ORDER — TRIAMCINOLONE ACETONIDE 40 MG/ML IJ SUSP
40.0000 mg | INTRAMUSCULAR | Status: AC | PRN
  Administered 2019-11-11: 40 mg via INTRA_ARTICULAR

## 2019-11-11 MED ORDER — LIDOCAINE HCL 1 % IJ SOLN
1.5000 mL | INTRAMUSCULAR | Status: AC | PRN
  Administered 2019-11-11: 1.5 mL

## 2019-11-11 NOTE — Patient Instructions (Signed)
Standing Labs We placed an order today for your standing lab work.    Please come back and get your standing labs in June and every 3 months  We have open lab daily Monday through Thursday from 8:30-12:30 PM and 1:30-4:30 PM and Friday from 8:30-12:30 PM and 1:30-4:00 PM at the office of Dr. Bo Merino.   You may experience shorter wait times on Monday and Friday afternoons. The office is located at [address], [place], [place], [place] [address] No appointment is necessary.   Labs are drawn by [facility].  You may receive a bill from [facility] for your lab work.  If you wish to have your labs drawn at another location, please call the office 24 hours in advance to send orders.  If you have any questions regarding directions or hours of operation,  please call [phone].   Just as a reminder please drink plenty of water prior to coming for your lab work. Thanks!   Iliotibial Band Syndrome Rehab Ask your health care provider which exercises are safe for you. Do exercises exactly as told by your health care provider and adjust them as directed. It is normal to feel mild stretching, pulling, tightness, or discomfort as you do these exercises. Stop right away if you feel sudden pain or your pain gets significantly worse. Do not begin these exercises until told by your health care provider. Stretching and range-of-motion exercises These exercises warm up your muscles and joints and improve the movement and flexibility of your hip and pelvis. Quadriceps stretch, prone  1. Lie on your abdomen on a firm surface, such as a bed or padded floor (prone position). 2. Bend your left / right knee and reach back to hold your ankle or pant leg. If you cannot reach your ankle or pant leg, loop a belt around your foot and grab the belt instead. 3. Gently pull your heel toward your buttocks. Your knee should not slide out to the side. You should feel a stretch in the front of your thigh and knee  (quadriceps). 4. Hold this position for __________ seconds. Repeat __________ times. Complete this exercise __________ times a day. Iliotibial band stretch An iliotibial band is a strong band of muscle tissue that runs from the outer side of your hip to the outer side of your thigh and knee. 1. Lie on your side with your left / right leg in the top position. 2. Bend both of your knees and grab your left / right ankle. Stretch out your bottom arm to help you balance. 3. Slowly bring your top knee back so your thigh goes behind your trunk. 4. Slowly lower your top leg toward the floor until you feel a gentle stretch on the outside of your left / right hip and thigh. If you do not feel a stretch and your knee will not fall farther, place the heel of your other foot on top of your knee and pull your knee down toward the floor with your foot. 5. Hold this position for __________ seconds. Repeat __________ times. Complete this exercise __________ times a day. Strengthening exercises These exercises build strength and endurance in your hip and pelvis. Endurance is the ability to use your muscles for a long time, even after they get tired. Straight leg raises, side-lying This exercise strengthens the muscles that rotate the leg at the hip and move it away from your body (hip abductors). 1. Lie on your side with your left / right leg in  the top position. Lie so your head, shoulder, hip, and knee line up. You may bend your bottom knee to help you balance. 2. Roll your hips slightly forward so your hips are stacked directly over each other and your left / right knee is facing forward. 3. Tense the muscles in your outer thigh and lift your top leg 4-6 inches (10-15 cm). 4. Hold this position for __________ seconds. 5. Slowly return to the starting position. Let your muscles relax completely before doing another repetition. Repeat __________ times. Complete this exercise __________ times a day. Leg raises,  prone This exercise strengthens the muscles that move the hips (hip extensors). 1. Lie on your abdomen on your bed or a firm surface. You can put a pillow under your hips if that is more comfortable for your lower back. 2. Bend your left / right knee so your foot is straight up in the air. 3. Squeeze your buttocks muscles and lift your left / right thigh off the bed. Do not let your back arch. 4. Tense your thigh muscle as hard as you can without increasing any knee pain. 5. Hold this position for __________ seconds. 6. Slowly lower your leg to the starting position and allow it to relax completely. Repeat __________ times. Complete this exercise __________ times a day. Hip hike 1. Stand sideways on a bottom step. Stand on your left / right leg with your other foot unsupported next to the step. You can hold on to the railing or wall for balance if needed. 2. Keep your knees straight and your torso square. Then lift your left / right hip up toward the ceiling. 3. Slowly let your left / right hip lower toward the floor, past the starting position. Your foot should get closer to the floor. Do not lean or bend your knees. Repeat __________ times. Complete this exercise __________ times a day. This information is not intended to replace advice given to you by your health care provider. Make sure you discuss any questions you have with your health care provider. Document Revised: 12/05/2018 Document Reviewed: 06/05/2018 Elsevier Patient Education  [place].

## 2019-11-12 NOTE — Progress Notes (Signed)
CBC is normal.  Sed rate is normal.  CMP shows elevated glucose.  Please notify patient.  Please forward labs to her PCP.

## 2019-11-13 LAB — COMPLETE METABOLIC PANEL WITH GFR
AG Ratio: 1.3 (calc) (ref 1.0–2.5)
ALT: 16 U/L (ref 6–29)
AST: 16 U/L (ref 10–35)
Albumin: 3.8 g/dL (ref 3.6–5.1)
Alkaline phosphatase (APISO): 126 U/L (ref 37–153)
BUN: 14 mg/dL (ref 7–25)
CO2: 24 mmol/L (ref 20–32)
Calcium: 8.8 mg/dL (ref 8.6–10.4)
Chloride: 104 mmol/L (ref 98–110)
Creat: 0.96 mg/dL (ref 0.50–1.05)
GFR, Est African American: 77 mL/min/{1.73_m2} (ref >=60)
GFR, Est Non African American: 67 mL/min/{1.73_m2} (ref >=60)
Globulin: 2.9 g/dL (calc) (ref 1.9–3.7)
Glucose, Bld: 250 mg/dL — ABNORMAL HIGH (ref 65–99)
Potassium: 4 mmol/L (ref 3.5–5.3)
Sodium: 138 mmol/L (ref 135–146)
Total Bilirubin: 0.4 mg/dL (ref 0.2–1.2)
Total Protein: 6.7 g/dL (ref 6.1–8.1)

## 2019-11-13 LAB — QUANTIFERON-TB GOLD PLUS
Mitogen-NIL: >10 IU/mL
NIL: 0.05 IU/mL
QuantiFERON-TB Gold Plus: NEGATIVE
TB1-NIL: 0.01 IU/mL
TB2-NIL: 0.01 IU/mL

## 2019-11-13 LAB — CBC WITH DIFFERENTIAL/PLATELET
Absolute Monocytes: 667 cells/uL (ref 200–950)
Basophils Absolute: 59 cells/uL (ref 0–200)
Basophils Relative: 0.9 %
Eosinophils Absolute: 53 cells/uL (ref 15–500)
Eosinophils Relative: 0.8 %
HCT: 40.1 % (ref 35.0–45.0)
Hemoglobin: 12.9 g/dL (ref 11.7–15.5)
Lymphs Abs: 1492 cells/uL (ref 850–3900)
MCH: 27.9 pg (ref 27.0–33.0)
MCHC: 32.2 g/dL (ref 32.0–36.0)
MCV: 86.6 fL (ref 80.0–100.0)
MPV: 10.7 fL (ref 7.5–12.5)
Monocytes Relative: 10.1 %
Neutro Abs: 4330 cells/uL (ref 1500–7800)
Neutrophils Relative %: 65.6 %
Platelets: 237 10*3/uL (ref 140–400)
RBC: 4.63 10*6/uL (ref 3.80–5.10)
RDW: 14 % (ref 11.0–15.0)
Total Lymphocyte: 22.6 %
WBC: 6.6 10*3/uL (ref 3.8–10.8)

## 2019-11-13 LAB — SEDIMENTATION RATE: Sed Rate: 25 mm/h (ref 0–30)

## 2019-11-21 ENCOUNTER — Other Ambulatory Visit: Payer: Self-pay | Admitting: Rheumatology

## 2019-11-21 NOTE — Telephone Encounter (Signed)
Last Visit: 11/11/19  Next Visit: 04/14/20  Labs: 11/11/19 CBC is normal. Sed rate is normal. CMP shows elevated glucose. Tb Gold: 11/11/19 Neg   Okay to refill per Dr. Estanislado Pandy

## 2019-11-25 MED FILL — HUMIRA PEN 40 MG/0.8ML PNKT: 40 | 28 days supply | Qty: 2 | Fill #0

## 2019-11-27 ENCOUNTER — Other Ambulatory Visit: Payer: Self-pay

## 2019-11-27 ENCOUNTER — Ambulatory Visit (INDEPENDENT_AMBULATORY_CARE_PROVIDER_SITE_OTHER): Payer: Medicare Other | Admitting: *Deleted

## 2019-11-27 DIAGNOSIS — J4550 Severe persistent asthma, uncomplicated: Secondary | ICD-10-CM | POA: Diagnosis not present

## 2019-12-15 ENCOUNTER — Telehealth: Payer: Self-pay | Admitting: *Deleted

## 2019-12-15 NOTE — Telephone Encounter (Signed)
Received DEXA results from [facility].  Date of Scan: 12/12/19 Lowest T-score and site measured:-1.9 AP Spine Significant changes in BMD and site measured (5% and above):n/a  Current Regimen:  Recommendation: Calcium 1200 mg  Diet and supplement , Vitamin D and Resistive exercises   Attempted to contact the patient and left message for patient to call the office.

## 2019-12-18 ENCOUNTER — Ambulatory Visit (INDEPENDENT_AMBULATORY_CARE_PROVIDER_SITE_OTHER): Payer: Medicare Other | Admitting: Allergy and Immunology

## 2019-12-18 ENCOUNTER — Ambulatory Visit: Payer: Medicare Other | Admitting: Allergy and Immunology

## 2019-12-18 ENCOUNTER — Encounter: Payer: Self-pay | Admitting: Allergy and Immunology

## 2019-12-18 ENCOUNTER — Other Ambulatory Visit: Payer: Self-pay

## 2019-12-18 VITALS — BP 130/72 | HR 84 | Resp 16

## 2019-12-18 DIAGNOSIS — K219 Gastro-esophageal reflux disease without esophagitis: Secondary | ICD-10-CM

## 2019-12-18 DIAGNOSIS — G4730 Sleep apnea, unspecified: Secondary | ICD-10-CM

## 2019-12-18 DIAGNOSIS — J4550 Severe persistent asthma, uncomplicated: Secondary | ICD-10-CM

## 2019-12-18 DIAGNOSIS — J3089 Other allergic rhinitis: Secondary | ICD-10-CM

## 2019-12-18 MED FILL — HUMIRA PEN 40 MG/0.8ML PNKT: 40 | 28 days supply | Qty: 2 | Fill #1

## 2019-12-18 NOTE — Progress Notes (Signed)
[facility] [place]   Follow-up Note  Referring Provider: Clancy Gourd, NP Primary Provider: Clancy Gourd, NP Date of Office Visit: 12/18/2019  Subjective:   Kimberly Jenkins (DOB: Jul 22, 1966) is a 54 y.o. female who returns to the [facility] on 12/18/2019 in re-evaluation of the following:  HPI: Kimberly Jenkins returns to this clinic in reevaluation of severe asthma and allergic rhinitis and LPR and sleep apnea.  Her last visit to this clinic was 25 September 2019.  During her last visit we started her on mepolizumab and this has resulted in dramatic improvement regarding her respiratory tract issue.  She rarely uses a short acting bronchodilator averaging out to 1-2 times per week at this point and she is able to exercise by walking over 2 miles without any difficulty.  She continues on [place] as the Trelegy made her cough.  She continues on mepolizumab injections.  She has had very little issues with her nose.  Her sleep apnea is going quite well.  Currently she is using a mask with 2 L nasal cannula oxygen.  She does not believe that she has ever had her oxygen level checked since she has been placed on this oxygen.  She underwent a colonoscopy and endoscopy recently.  She had her esophagus dilated and apparently had a biopsy which was normal and apparently she had some polyps in her colon.  She is currently using a combination of omeprazole and Pepcid to treat her reflux.  She has received 2 Covid vaccinations.  Allergies as of 12/18/2019      Reactions   Methotrexate Diarrhea   Advair Diskus [fluticasone-salmeterol]    Aspirin    Ciprofloxacin    Clonidine Derivatives    Nystatin    Oxycodone    Serevent [salmeterol]    Cephalexin Nausea And Vomiting      Medication List    Breo Ellipta 200-25 MCG/INH Aepb Generic drug: fluticasone furoate-vilanterol SMARTSIG:1 Inhalation By Mouth Daily   busPIRone  15 MG tablet Commonly known as: BUSPAR Take one tablet three times daily.   cetirizine 10 MG tablet Commonly known as: ZYRTEC Take 10 mg by mouth daily.   clorazepate 3.75 MG tablet Commonly known as: TRANXENE Take one tablet four times a day as needed.   Combivent Respimat 20-100 MCG/ACT Aers respimat Generic drug: Ipratropium-Albuterol Inhale 1 puff into the lungs every 6 (six) hours as needed for wheezing.   ipratropium-albuterol 0.5-2.5 (3) MG/3ML Soln Commonly known as: DUONEB USE ONE VIAL VIA NEBULIZER EVERY 4-6 HOURS AS NEEDED FOR COUGH OR WHEEZE   Easy Touch Pen Needles 31G X 8 MM Misc Generic drug: Insulin Pen Needle   EPINEPHrine 0.3 mg/0.3 mL Soaj injection Commonly known as: EpiPen 2-Pak Use as directed for life-threatening allergic reaction.   ezetimibe 10 MG tablet Commonly known as: ZETIA Take 10 mg by mouth daily.   famotidine 20 MG tablet Commonly known as: PEPCID Take 1 tablet by mouth at bedtime.   FISH OIL PO Take by mouth.   FLUoxetine 40 MG capsule Commonly known as: PROZAC Take 40 mg by mouth daily.   folic acid 1 MG tablet Commonly known as: FOLVITE TAKE 2 TABLETS BY MOUTH EVERY DAY   furosemide 40 MG tablet Commonly known as: LASIX Take 20 mg by mouth every other day.   gabapentin 400 MG capsule Commonly known as: NEURONTIN Take 600 mg by mouth 3 (three) times daily.  Humira Pen 40 MG/0.8ML Pnkt Generic drug: Adalimumab INJECT 40 MG INTO THE SKIN EVERY 14 DAYS.   HYDROcodone-acetaminophen 10-325 MG tablet Commonly known as: NORCO Take by mouth.   lamoTRIgine 25 MG tablet Commonly known as: LAMICTAL Take 25 mg by mouth 2 (two) times daily.   Lantus SoloStar 100 UNIT/ML Solostar Pen Generic drug: insulin glargine as needed.   leflunomide 10 MG tablet Commonly known as: ARAVA Take 2 tablets (20 mg total) by mouth daily.   levothyroxine 50 MCG tablet Commonly known as: SYNTHROID TAKE 1 TABLET BY MOUTH EVERY DAY AT 6  AM   metFORMIN 500 MG tablet Commonly known as: GLUCOPHAGE Take 500 mg by mouth 2 (two) times daily with a meal.   montelukast 10 MG tablet Commonly known as: SINGULAIR Take 1 tablet (10 mg total) by mouth daily.   Nucala 100 MG Solr Generic drug: Mepolizumab   nystatin cream Commonly known as: MYCOSTATIN as needed.   olopatadine 0.1 % ophthalmic solution Commonly known as: PATANOL   omeprazole 40 MG capsule Commonly known as: PRILOSEC Take 40 mg by mouth 2 (two) times daily.   phentermine 37.5 MG tablet Commonly known as: ADIPEX-P Take 37.5 mg by mouth daily.   potassium chloride SA 20 MEQ tablet Commonly known as: KLOR-CON Take by mouth.   pramipexole 0.125 MG tablet Commonly known as: MIRAPEX Take by mouth.   pravastatin 40 MG tablet Commonly known as: PRAVACHOL Take 40 mg by mouth daily.   progesterone 100 MG capsule Commonly known as: PROMETRIUM Take 200 mg by mouth daily.   Rexulti 1 MG Tabs tablet Generic drug: brexpiprazole Take 1 mg by mouth daily.   rizatriptan 10 MG tablet Commonly known as: MAXALT Take 10 mg by mouth as needed.   Spiriva Respimat 1.25 MCG/ACT Aers Generic drug: Tiotropium Bromide Monohydrate SMARTSIG:2 Puff(s) By Mouth Daily   topiramate 25 MG tablet Commonly known as: TOPAMAX Take 75 mg by mouth daily.   triamcinolone cream 0.1 % Commonly known as: KENALOG as needed.   VICTOZA [place] Inject into the skin.   Vitamin D (Ergocalciferol) 1.25 MG (50000 UNIT) Caps capsule Commonly known as: DRISDOL Take by mouth every 7 (seven) days.       Past Medical History:  Diagnosis Date  . Allergic rhinitis   . Asthma   . Cancer of kidney ([facility])   . COPD (chronic obstructive pulmonary disease) ([facility])   . Dehydration   . GERD (gastroesophageal reflux disease)   . Hypothyroidism   . Kidney disease   . Rheumatoid arthritis ([place])   . Sleep apnea   . Tremor     Past Surgical History:  Procedure Laterality Date  .  APPENDECTOMY    . CESAREAN SECTION     2 times  . CHOLECYSTECTOMY    . COLONOSCOPY W/ POLYPECTOMY  2021  . ESOPHAGEAL DILATION  2021  . KIDNEY SURGERY    . SHOULDER SURGERY    . TONSILLECTOMY    . TOTAL SHOULDER ARTHROPLASTY    . TUBAL LIGATION      Review of systems negative except as noted in HPI / PMHx or noted below:  Review of Systems  Constitutional: Negative.   HENT: Negative.   Eyes: Negative.   Respiratory: Negative.   Cardiovascular: Negative.   Gastrointestinal: Negative.   Genitourinary: Negative.   Musculoskeletal: Negative.   Skin: Negative.   Neurological: Negative.   Endo/Heme/Allergies: Negative.   Psychiatric/Behavioral: Negative.      Objective:   Vitals:  12/18/19 1358  BP: 130/72  Pulse: 84  Resp: 16  SpO2: 97%          Physical Exam Constitutional:      Appearance: She is not diaphoretic.  HENT:     Head: Normocephalic.     Right Ear: Tympanic membrane, ear canal and external ear normal.     Left Ear: Tympanic membrane, ear canal and external ear normal.     Nose: Nose normal. No mucosal edema or rhinorrhea.     Mouth/Throat:     Pharynx: Uvula midline. No oropharyngeal exudate.  Eyes:     Conjunctiva/sclera: Conjunctivae normal.  Neck:     Thyroid: No thyromegaly.     Trachea: Trachea normal. No tracheal tenderness or tracheal deviation.  Cardiovascular:     Rate and Rhythm: Normal rate and regular rhythm.     Heart sounds: Normal heart sounds, S1 normal and S2 normal. No murmur.  Pulmonary:     Effort: No respiratory distress.     Breath sounds: Normal breath sounds. No stridor. No wheezing or rales.  Lymphadenopathy:     Head:     Right side of head: No tonsillar adenopathy.     Left side of head: No tonsillar adenopathy.     Cervical: No cervical adenopathy.  Skin:    Findings: No erythema or rash.     Nails: There is no clubbing.  Neurological:     Mental Status: She is alert.     Diagnostics:    Spirometry was  performed and demonstrated an FEV1 of 2.07 at 86 % of predicted.  Assessment and Plan:   1. Asthma, severe persistent, well-controlled   2. Other allergic rhinitis   3. LPRD (laryngopharyngeal reflux disease)   4. Sleep apnea with use of continuous positive airway pressure (CPAP)     1. Continue Breo and Spiriva  2. Continue Mepolizumab    3. Continue Montelukast - 10 one tablet one time per day  4. Continue Omeprazole 40 one tablet two times per day + Pepcid 40 mg in evening  5. Continue Zyrtec 10 one tablet one time per day  6. Use Duoneb nebulization or Combivent two puffs every 4-6 hours if needed.  7. Return to clinic in 12 weeks or earlier if problem  8. Obtain nocturnal oximetry study while using CPAP with 2 L nasal cannula oxygen  Kimberly Jenkins has had an excellent response to the administration of mepolizumab.  She will continue on this biological agent along with her anti-inflammatory agents for her airway and also continue to treat her reflux.  I think the one issue we do need to further explore is whether or not she is getting adequate levels of oxygenation while utilizing her CPAP machine with 2 L nasal cannula oxygen.  We will arrange for nocturnal oximetry study.  I will contact her with the results of the study once it is available for review.  Allena Katz, MD Allergy / Immunology [place]

## 2019-12-18 NOTE — Patient Instructions (Addendum)
1. Continue Breo and Spiriva  2. Continue Mepolizumab    3. Continue Montelukast - 10 one tablet one time per day  4. Continue Omeprazole 40 one tablet two times per day + Pepcid 40 mg in evening  5. Continue Zyrtec 10 one tablet one time per day  6. Use Duoneb nebulization or Combivent two puffs every 4-6 hours if needed.  7. Return to clinic in 12 weeks or earlier if problem  8. Obtain nocturnal oximetry study while using CPAP with 2 L nasal cannula oxygen

## 2019-12-22 ENCOUNTER — Telehealth: Payer: Self-pay

## 2019-12-22 ENCOUNTER — Encounter: Payer: Self-pay | Admitting: Allergy and Immunology

## 2019-12-22 NOTE — Telephone Encounter (Signed)
Faxed order for overnight oximetry while using CPAP with 2 L nasal cannula to [facility].  Patient currently gets her CPAP and oxygen from [facility]. Fax number is [phone].

## 2019-12-25 ENCOUNTER — Other Ambulatory Visit: Payer: Self-pay

## 2019-12-25 ENCOUNTER — Ambulatory Visit (INDEPENDENT_AMBULATORY_CARE_PROVIDER_SITE_OTHER): Payer: Medicare Other | Admitting: *Deleted

## 2019-12-25 DIAGNOSIS — J4550 Severe persistent asthma, uncomplicated: Secondary | ICD-10-CM | POA: Diagnosis not present

## 2020-01-16 ENCOUNTER — Encounter: Payer: Self-pay | Admitting: Allergy and Immunology

## 2020-01-22 ENCOUNTER — Ambulatory Visit: Payer: Medicare Other

## 2020-01-22 MED FILL — HUMIRA PEN 40 MG/0.8ML PNKT: 40 | 28 days supply | Qty: 2 | Fill #2

## 2020-01-28 ENCOUNTER — Other Ambulatory Visit: Payer: Self-pay

## 2020-01-28 ENCOUNTER — Ambulatory Visit (INDEPENDENT_AMBULATORY_CARE_PROVIDER_SITE_OTHER): Payer: Medicare Other | Admitting: *Deleted

## 2020-01-28 DIAGNOSIS — J4550 Severe persistent asthma, uncomplicated: Secondary | ICD-10-CM | POA: Diagnosis not present

## 2020-02-20 ENCOUNTER — Other Ambulatory Visit: Payer: Self-pay | Admitting: Rheumatology

## 2020-02-20 NOTE — Telephone Encounter (Signed)
Patient states she would prefer to wait until labs result to send a 90 day prescription instead of a 30 day supply.

## 2020-02-20 NOTE — Telephone Encounter (Signed)
Last Visit: 11/11/19  Next Visit: 04/14/20  Labs: 11/11/19 CBC is normal. Sed rate is normal. CMP shows elevated glucose. Tb Gold: 11/11/19 Neg   Current Dose per office note on 11/11/2019: Humira 40 mg subcutaneous every other week  Patient advised she is due to update labs. Patient states she will update them next week.   Okay to refill Humira?

## 2020-02-20 NOTE — Telephone Encounter (Signed)
We can send in a 30 day supply of Humira until she completes lab work or we can wait to send the refill until the labs have resulted and send a 90-day supply at that time.  Please clarify the patients preference.

## 2020-02-25 ENCOUNTER — Ambulatory Visit (INDEPENDENT_AMBULATORY_CARE_PROVIDER_SITE_OTHER): Payer: Medicare Other | Admitting: *Deleted

## 2020-02-25 ENCOUNTER — Other Ambulatory Visit: Payer: Self-pay

## 2020-02-25 ENCOUNTER — Other Ambulatory Visit: Payer: Self-pay | Admitting: *Deleted

## 2020-02-25 DIAGNOSIS — Z79899 Other long term (current) drug therapy: Secondary | ICD-10-CM

## 2020-02-25 DIAGNOSIS — J4550 Severe persistent asthma, uncomplicated: Secondary | ICD-10-CM | POA: Diagnosis not present

## 2020-02-26 LAB — CBC WITH DIFFERENTIAL/PLATELET
Absolute Monocytes: 728 cells/uL (ref 200–950)
Basophils Absolute: 59 cells/uL (ref 0–200)
Basophils Relative: 0.9 %
Eosinophils Absolute: 78 cells/uL (ref 15–500)
Eosinophils Relative: 1.2 %
HCT: 37.1 % (ref 35.0–45.0)
Hemoglobin: 12.1 g/dL (ref 11.7–15.5)
Lymphs Abs: 1937 cells/uL (ref 850–3900)
MCH: 28.9 pg (ref 27.0–33.0)
MCHC: 32.6 g/dL (ref 32.0–36.0)
MCV: 88.8 fL (ref 80.0–100.0)
MPV: 11 fL (ref 7.5–12.5)
Monocytes Relative: 11.2 %
Neutro Abs: 3699 cells/uL (ref 1500–7800)
Neutrophils Relative %: 56.9 %
Platelets: 227 10*3/uL (ref 140–400)
RBC: 4.18 10*6/uL (ref 3.80–5.10)
RDW: 14.5 % (ref 11.0–15.0)
Total Lymphocyte: 29.8 %
WBC: 6.5 10*3/uL (ref 3.8–10.8)

## 2020-02-26 LAB — COMPLETE METABOLIC PANEL WITH GFR
AG Ratio: 1.2 (calc) (ref 1.0–2.5)
ALT: 14 U/L (ref 6–29)
AST: 14 U/L (ref 10–35)
Albumin: 3.6 g/dL (ref 3.6–5.1)
Alkaline phosphatase (APISO): 119 U/L (ref 37–153)
BUN/Creatinine Ratio: 9 (calc) (ref 6–22)
BUN: 10 mg/dL (ref 7–25)
CO2: 27 mmol/L (ref 20–32)
Calcium: 8.8 mg/dL (ref 8.6–10.4)
Chloride: 105 mmol/L (ref 98–110)
Creat: 1.16 mg/dL — ABNORMAL HIGH (ref 0.50–1.05)
GFR, Est African American: 61 mL/min/{1.73_m2} (ref >=60)
GFR, Est Non African American: 53 mL/min/{1.73_m2} — ABNORMAL LOW (ref >=60)
Globulin: 2.9 g/dL (calc) (ref 1.9–3.7)
Glucose, Bld: 187 mg/dL — ABNORMAL HIGH (ref 65–99)
Potassium: 4 mmol/L (ref 3.5–5.3)
Sodium: 139 mmol/L (ref 135–146)
Total Bilirubin: 0.3 mg/dL (ref 0.2–1.2)
Total Protein: 6.5 g/dL (ref 6.1–8.1)

## 2020-02-26 NOTE — Progress Notes (Signed)
CBC is normal.  Glucose is elevated.  GFR is low.  Most likely due to Lasix use.  Please forward labs to her PCP.

## 2020-03-10 ENCOUNTER — Ambulatory Visit: Payer: Medicare Other | Admitting: Allergy and Immunology

## 2020-03-17 ENCOUNTER — Other Ambulatory Visit: Payer: Self-pay | Admitting: Rheumatology

## 2020-03-17 MED FILL — HUMIRA PEN 40 MG/0.8ML PNKT: 40 | 28 days supply | Qty: 2 | Fill #0

## 2020-03-17 NOTE — Telephone Encounter (Signed)
Last Visit: 11/11/19  Next Visit: 04/14/20  Labs: 02/25/2020 CBC is normal. Glucose is elevated. GFR is low. Most likely due to Lasix use Tb Gold: 11/11/19 Neg   Current Dose per office note on 11/11/2019: Humira 40 mg subcutaneous every other week  Okay to refill Humira?

## 2020-03-18 ENCOUNTER — Other Ambulatory Visit: Payer: Self-pay

## 2020-03-18 ENCOUNTER — Encounter: Payer: Self-pay | Admitting: Allergy and Immunology

## 2020-03-18 ENCOUNTER — Ambulatory Visit (INDEPENDENT_AMBULATORY_CARE_PROVIDER_SITE_OTHER): Payer: Medicare Other | Admitting: Allergy and Immunology

## 2020-03-18 VITALS — BP 120/68 | HR 82 | Resp 20

## 2020-03-18 DIAGNOSIS — J4550 Severe persistent asthma, uncomplicated: Secondary | ICD-10-CM

## 2020-03-18 DIAGNOSIS — J3089 Other allergic rhinitis: Secondary | ICD-10-CM | POA: Diagnosis not present

## 2020-03-18 DIAGNOSIS — K219 Gastro-esophageal reflux disease without esophagitis: Secondary | ICD-10-CM

## 2020-03-18 DIAGNOSIS — R635 Abnormal weight gain: Secondary | ICD-10-CM | POA: Diagnosis not present

## 2020-03-18 MED ORDER — IPRATROPIUM-ALBUTEROL 0.5-2.5 (3) MG/3ML IN SOLN: RESPIRATORY_TRACT | 1 refills | Status: DC

## 2020-03-18 NOTE — Patient Instructions (Addendum)
1. Continue Breo and Spiriva  2. Continue Mepolizumab injections  3. Continue Montelukast - 10 one tablet one time per day  4. Continue Flonase - 1-2 sprays each nostril 1 time per day  5. Continue Omeprazole 20 one tablet two times per day + Pepcid 20 mg in evening  6. Continue Zyrtec 10 one tablet one time per day  7. Use Duoneb nebulization or Combivent two puffs every 4-6 hours if needed.  8. Return to clinic in 12 weeks or earlier if problem  9. Obtain fall flu vaccine

## 2020-03-18 NOTE — Progress Notes (Signed)
[facility] [place]   Follow-up Note  Referring Provider: Clancy Gourd, NP Primary Provider: Clancy Gourd, NP Date of Office Visit: 03/18/2020  Subjective:   Kimberly Jenkins (DOB: 10-Dec-1965) is a 54 y.o. female who returns to the [facility] on 03/18/2020 in re-evaluation of the following:  HPI: [name] returns to this clinic in reevaluation of severe eosinophilic asthma, allergic rhinitis, LPR, and history of sleep apnea.  I last saw her in this clinic on 18 December 2019.  The big issue for [name] at this point in time is that she is having significant stress at home which is causing her to impulsively eat and she has gained 30 pounds since her last visit.  Her stress comes about from the fact that her children are dumping the grandchildren onto her both daytime and nighttime.  It sounds as though 3 of her children are spending all day and all night at her house 5 days/week and 3 other children are spending a few days per week at her house.  She has discussed this issue with her psychiatrist recently and apparently has had some of her psychiatric medications increased to address this issue.  Her added weight gain has caused her to get a little bit more short of breath when she exerts herself and apparently her blood sugars are a little bit more out of control and she does not think her sleep apnea therapy which includes CPAP with 2 L nasal cannula oxygen is working as well.  Her breathing has not required the administration of a systemic steroid or antibiotics since she was last seen in this clinic.  She uses her nebulized bronchodilator about twice a week.  She will use her Combivent if she exerts herself to any degree.  It sounds as though her nose is doing well.  It sounds as though her reflux is under good control.  Allergies as of 03/18/2020      Reactions   Methotrexate Diarrhea   Advair Diskus  [fluticasone-salmeterol]    Aspirin    Ciprofloxacin    Clonidine Derivatives    Nystatin    Oxycodone    Serevent [salmeterol]    Cephalexin Nausea And Vomiting      Medication List      Breo Ellipta 200-25 MCG/INH Aepb Generic drug: fluticasone furoate-vilanterol SMARTSIG:1 Inhalation By Mouth Daily   busPIRone 15 MG tablet Commonly known as: BUSPAR Take one tablet three times daily.   cetirizine 10 MG tablet Commonly known as: ZYRTEC Take 10 mg by mouth daily.   clorazepate 3.75 MG tablet Commonly known as: TRANXENE Take one tablet four times a day as needed.   Combivent Respimat 20-100 MCG/ACT Aers respimat Generic drug: Ipratropium-Albuterol Inhale 1 puff into the lungs every 6 (six) hours as needed for wheezing.   ipratropium-albuterol 0.5-2.5 (3) MG/3ML Soln Commonly known as: DUONEB CAN USE ONE VIAL VIA NEBULIZER EVERY 4-6 HOURS AS NEEDED FOR COUGH OR WHEEZE   donepezil 10 MG tablet Commonly known as: ARICEPT Take 10 mg by mouth daily.   Easy Touch Pen Needles 31G X 8 MM Misc Generic drug: Insulin Pen Needle   EPINEPHrine 0.3 mg/0.3 mL Soaj injection Commonly known as: EpiPen 2-Pak Use as directed for life-threatening allergic reaction.   ezetimibe 10 MG tablet Commonly known as: ZETIA Take 10 mg by mouth daily.   famotidine 20 MG tablet Commonly known as: PEPCID Take 1 tablet by mouth at  bedtime.   FISH OIL PO Take by mouth.   FLUoxetine 20 MG capsule Commonly known as: PROZAC Take 60 mg by mouth daily.   fluticasone 50 MCG/ACT nasal spray Commonly known as: FLONASE Place 2 sprays into both nostrils daily.   folic acid 1 MG tablet Commonly known as: FOLVITE TAKE 2 TABLETS BY MOUTH EVERY DAY   furosemide 40 MG tablet Commonly known as: LASIX Take 20 mg by mouth every other day.   gabapentin 600 MG tablet Commonly known as: NEURONTIN Take 600 mg by mouth 3 (three) times daily.   Humira Pen 40 MG/0.8ML Pnkt Generic drug:  Adalimumab INJECT 40 MG INTO THE SKIN EVERY 14 DAYS.   HYDROcodone-acetaminophen 10-325 MG tablet Commonly known as: NORCO Take by mouth.   lamoTRIgine 25 MG tablet Commonly known as: LAMICTAL Take 25 mg by mouth 2 (two) times daily.   Lantus SoloStar 100 UNIT/ML Solostar Pen Generic drug: insulin glargine as needed.   leflunomide 10 MG tablet Commonly known as: ARAVA Take 2 tablets (20 mg total) by mouth daily.   levothyroxine 50 MCG tablet Commonly known as: SYNTHROID TAKE 1 TABLET BY MOUTH EVERY DAY AT 6 AM   Linzess 72 MCG capsule Generic drug: linaclotide Take 72 mcg by mouth daily.   metFORMIN 500 MG tablet Commonly known as: GLUCOPHAGE Take 500 mg by mouth 2 (two) times daily with a meal.   montelukast 10 MG tablet Commonly known as: SINGULAIR Take 1 tablet (10 mg total) by mouth daily.   Nucala 100 MG Solr Generic drug: Mepolizumab   nystatin cream Commonly known as: MYCOSTATIN as needed.   olopatadine 0.1 % ophthalmic solution Commonly known as: PATANOL   omeprazole 40 MG capsule Commonly known as: PRILOSEC Take 40 mg by mouth 2 (two) times daily.   phentermine 37.5 MG tablet Commonly known as: ADIPEX-P Take 37.5 mg by mouth daily.   potassium chloride SA 20 MEQ tablet Commonly known as: KLOR-CON Take by mouth.   pramipexole 0.125 MG tablet Commonly known as: MIRAPEX Take by mouth.   pravastatin 40 MG tablet Commonly known as: PRAVACHOL Take 40 mg by mouth daily.   progesterone 100 MG capsule Commonly known as: PROMETRIUM Take 200 mg by mouth daily.   Rexulti 2 MG Tabs tablet Generic drug: brexpiprazole Take 2 mg by mouth daily.   rizatriptan 10 MG tablet Commonly known as: MAXALT Take 10 mg by mouth as needed.   Spiriva Respimat 1.25 MCG/ACT Aers Generic drug: Tiotropium Bromide Monohydrate SMARTSIG:2 Puff(s) By Mouth Daily   topiramate 25 MG tablet Commonly known as: TOPAMAX Take 75 mg by mouth daily.   triamcinolone  cream 0.1 % Commonly known as: KENALOG as needed.   VICTOZA [place] Inject into the skin.   Vitamin D (Ergocalciferol) 1.25 MG (50000 UNIT) Caps capsule Commonly known as: DRISDOL Take by mouth every 7 (seven) days.       Past Medical History:  Diagnosis Date  . Allergic rhinitis   . Asthma   . Cancer of kidney ([place])   . COPD (chronic obstructive pulmonary disease) ([place])   . Dehydration   . GERD (gastroesophageal reflux disease)   . Hypothyroidism   . Kidney disease   . Rheumatoid arthritis ([place])   . Sleep apnea   . Tremor     Past Surgical History:  Procedure Laterality Date  . APPENDECTOMY    . CESAREAN SECTION     2 times  . CHOLECYSTECTOMY    . COLONOSCOPY W/ POLYPECTOMY  2021  . ESOPHAGEAL DILATION  2021  . KIDNEY SURGERY    . SHOULDER SURGERY    . TONSILLECTOMY    . TOTAL SHOULDER ARTHROPLASTY    . TUBAL LIGATION      Review of systems negative except as noted in HPI / PMHx or noted below:  Review of Systems  Constitutional: Negative.   HENT: Negative.   Eyes: Negative.   Respiratory: Negative.   Cardiovascular: Negative.   Gastrointestinal: Negative.   Genitourinary: Negative.   Musculoskeletal: Negative.   Skin: Negative.   Neurological: Negative.   Endo/Heme/Allergies: Negative.   Psychiatric/Behavioral: Negative.      Objective:   Vitals:   03/18/20 1032  BP: 120/68  Pulse: 82  Resp: 20  SpO2: 97%          Physical Exam Constitutional:      Appearance: She is not diaphoretic.  HENT:     Head: Normocephalic.     Right Ear: Tympanic membrane, ear canal and external ear normal.     Left Ear: Tympanic membrane, ear canal and external ear normal.     Nose: Nose normal. No mucosal edema or rhinorrhea.     Mouth/Throat:     Pharynx: Uvula midline. No oropharyngeal exudate.  Eyes:     Conjunctiva/sclera: Conjunctivae normal.  Neck:     Thyroid: No thyromegaly.     Trachea: Trachea normal. No tracheal tenderness or tracheal  deviation.  Cardiovascular:     Rate and Rhythm: Normal rate and regular rhythm.     Heart sounds: Normal heart sounds, S1 normal and S2 normal. No murmur heard.   Pulmonary:     Effort: No respiratory distress.     Breath sounds: Normal breath sounds. No stridor. No wheezing or rales.  Lymphadenopathy:     Head:     Right side of head: No tonsillar adenopathy.     Left side of head: No tonsillar adenopathy.     Cervical: No cervical adenopathy.  Skin:    Findings: No erythema or rash.     Nails: There is no clubbing.  Neurological:     Mental Status: She is alert.     Diagnostics:    Spirometry was performed and demonstrated an FEV1 of 1.30 at 55 % of predicted.   Assessment and Plan:   1. Asthma, severe persistent, well-controlled   2. Other allergic rhinitis   3. LPRD (laryngopharyngeal reflux disease)   4. Weight gain     1. Continue Breo and Spiriva  2. Continue Mepolizumab injections  3. Continue Montelukast - 10 one tablet one time per day  4. Continue Flonase - 1-2 sprays each nostril 1 time per day  5. Continue Omeprazole 20 one tablet two times per day + Pepcid 20 mg in evening  6. Continue Zyrtec 10 one tablet one time per day  7. Use Duoneb nebulization or Combivent two puffs every 4-6 hours if needed.  8. Return to clinic in 12 weeks or earlier if problem  9. Obtain fall flu vaccine  [name] appears to be doing okay regarding her respiratory tract issue on her current plan and I am not really going to change any of her therapy and she will continue to use a collection of anti-inflammatory agents including the use of mepolizumab injections directed against respiratory tract inflammation and also therapy directed against reflux.  I had a long talk with her today about the stress that is going on at home and I gave her  a calendar of the county school system showing her that in 3 weeks the grandchildren will be attending school and at that point in time she  needs to insist to her children that the grandchildren remain at their parent's house rather than her house on a consistent basis.  She appeared to be quite happy about that upcoming event.  I will see her back in this clinic in 12 weeks or earlier if there is a problem.  Allena Katz, MD [facility] [place]

## 2020-03-22 ENCOUNTER — Encounter: Payer: Self-pay | Admitting: Allergy and Immunology

## 2020-03-24 ENCOUNTER — Other Ambulatory Visit: Payer: Self-pay

## 2020-03-24 ENCOUNTER — Ambulatory Visit (INDEPENDENT_AMBULATORY_CARE_PROVIDER_SITE_OTHER): Payer: Medicare Other

## 2020-03-24 DIAGNOSIS — J4550 Severe persistent asthma, uncomplicated: Secondary | ICD-10-CM | POA: Diagnosis not present

## 2020-04-01 NOTE — Progress Notes (Signed)
Office Visit Note  Patient: Kimberly Jenkins             Date of Birth: [birth year]           MRN: [id number]             PCP: Clancy Gourd, NP Referring: Clancy Gourd, NP Visit Date: 04/14/2020 Occupation: @GUAROCC @  Subjective:  Other (left hip pain, hand cramps )   History of Present Illness: Kimberly Jenkins is a 54 y.o. female with rheumatoid arthritis, osteoarthritis and fibromyalgia syndrome.  She states she continues to have a lot of pain and discomfort from osteoarthritis and fibromyalgia.  She has been going to pain clinic.  She states she was having lot of muscle cramps and since she has been taking Flexeril she has noticed some improvement in her muscle cramps.  She does not have any joint swelling but she continues to have joint discomfort in almost all of her joints.  She states her depression got worse and she gained a lot of weight now.  She is going to a psychiatrist in her depression medications have been adjusted.  Activities of Daily Living:  Patient reports morning stiffness for a few  minutes.   Patient Reports nocturnal pain.  Difficulty dressing/grooming: Reports Difficulty climbing stairs: Reports Difficulty getting out of chair: Reports Difficulty using hands for taps, buttons, cutlery, and/or writing: Denies  Review of Systems  Constitutional: Positive for fatigue.  HENT: Positive for mouth dryness. Negative for mouth sores and nose dryness.   Eyes: Negative for itching and dryness.  Respiratory: Negative for shortness of breath and difficulty breathing.   Cardiovascular: Negative for chest pain and palpitations.  Gastrointestinal: Positive for diarrhea. Negative for blood in stool and constipation.  Endocrine: Negative for increased urination.  Genitourinary: Negative for difficulty urinating.  Musculoskeletal: Positive for arthralgias, joint pain, myalgias, morning stiffness, muscle tenderness and myalgias. Negative for joint  swelling.  Skin: Negative for color change, rash and redness.  Allergic/Immunologic: Negative for susceptible to infections.  Neurological: Positive for numbness, memory loss and weakness. Negative for dizziness and headaches.  Hematological: Positive for bruising/bleeding tendency.  Psychiatric/Behavioral: Positive for depressed mood. Negative for confusion. The patient is nervous/anxious.     PMFS History:  Patient Active Problem List   Diagnosis Date Noted  . Pain in right knee 08/28/2019  . Fibromyalgia 08/27/2016  . History of right rotator cuff tear repair 08/27/2016  . Diabetes 08/27/2016  . Chronic obstructive pulmonary disease ([place]) 08/27/2016  . Dyslipidemia 08/27/2016  . History of renal cell carcinoma 08/27/2016  . Anxiety 08/27/2016  . Sleep apnea 08/27/2016  . Rheumatoid arthritis of multiple sites with negative rheumatoid factor  07/18/2016  . High risk medication use 07/18/2016  . Hand pain 07/13/2016  . Foot pain 07/13/2016  . Elevated sed rate 07/13/2016  . Severe persistent asthma 04/30/2015  . Other allergic rhinitis 04/30/2015  . GERD (gastroesophageal reflux disease) 04/30/2015    Past Medical History:  Diagnosis Date  . Allergic rhinitis   . Asthma   . Cancer of kidney ([place])   . COPD (chronic obstructive pulmonary disease) ([place])   . Dehydration   . GERD (gastroesophageal reflux disease)   . Hypothyroidism   . Kidney disease   . Rheumatoid arthritis ([place])   . Sleep apnea   . Tremor     Family History  Problem Relation Age of Onset  . Brain cancer Brother   . Asthma Mother   .  Lung cancer Mother   . Asthma Father   . Allergic rhinitis Father   . COPD Father   . Heart attack Father   . Arthritis Father   . Brain cancer Paternal Uncle   . Asthma Maternal Grandmother   . Brain cancer Paternal Grandmother   . Heart attack Brother    Past Surgical History:  Procedure Laterality Date  . APPENDECTOMY    . CESAREAN SECTION     2 times  .  CHOLECYSTECTOMY    . COLONOSCOPY W/ POLYPECTOMY  2021  . ESOPHAGEAL DILATION  2021  . KIDNEY SURGERY    . SHOULDER SURGERY    . TONSILLECTOMY    . TOTAL SHOULDER ARTHROPLASTY    . TUBAL LIGATION     Social History   Social History Narrative  . Not on file   Immunization History  Administered Date(s) Administered  . PFIZER SARS-COV-2 Vaccination 11/18/2019, 12/09/2019     Objective: Vital Signs: BP 116/72 (BP Location: Left Arm, Patient Position: Sitting, Cuff Size: Normal)   Pulse 75   Resp 16   Ht 5\' 1"  (1.549 m)   Wt 225 lb (102.1 kg)   BMI 42.51 kg/m    Physical Exam Vitals and nursing note reviewed.  Constitutional:      Appearance: She is well-developed.  HENT:     Head: Normocephalic and atraumatic.  Eyes:     Conjunctiva/sclera: Conjunctivae normal.  Cardiovascular:     Rate and Rhythm: Normal rate and regular rhythm.     Heart sounds: Normal heart sounds.  Pulmonary:     Effort: Pulmonary effort is normal.     Breath sounds: Normal breath sounds.  Abdominal:     General: Bowel sounds are normal.     Palpations: Abdomen is soft.  Musculoskeletal:     Cervical back: Normal range of motion.  Lymphadenopathy:     Cervical: No cervical adenopathy.  Skin:    General: Skin is warm and dry.     Capillary Refill: Capillary refill takes less than 2 seconds.  Neurological:     Mental Status: She is alert and oriented to person, place, and time.  Psychiatric:        Behavior: Behavior normal.      Musculoskeletal Exam: She had limited range of motion of her cervical spine and tenderness over trapezius area.  She had limited range of motion of her lumbar spine.  Shoulder joints and elbow joints with good range of motion.  She has DIP and PIP thickening with no synovitis over MCP or wrist joints.  She has good range of motion of her hip joints and knee joints.  No tenderness was noted over her MTPs.  She has generalized hyperalgesia and positive tender  points.  CDAI Exam: CDAI Score: -- Patient Global: 5 mm; Provider Global: -- Swollen: 0 ; Tender: 0  Joint Exam 04/14/2020   No joint exam has been documented for this visit   There is currently no information documented on the homunculus. Go to the Rheumatology activity and complete the homunculus joint exam.  Investigation: No additional findings.  Imaging: No results found.  Recent Labs: Lab Results  Component Value Date   WBC 6.5 02/25/2020   HGB 12.1 02/25/2020   PLT 227 02/25/2020   NA 139 02/25/2020   K 4.0 02/25/2020   CL 105 02/25/2020   CO2 27 02/25/2020   GLUCOSE 187 (H) 02/25/2020   BUN 10 02/25/2020   CREATININE 1.16 (H) 02/25/2020  BILITOT 0.3 02/25/2020   ALKPHOS 160 (H) 08/07/2019   AST 14 02/25/2020   ALT 14 02/25/2020   PROT 6.5 02/25/2020   ALBUMIN 4.2 08/07/2019   CALCIUM 8.8 02/25/2020   GFRAA 61 02/25/2020   QFTBGOLD NEGATIVE 06/26/2017   QFTBGOLDPLUS NEGATIVE 11/11/2019    Speciality Comments: No specialty comments available.  Procedures:  No procedures performed Allergies: Methotrexate, Advair diskus [fluticasone-salmeterol], Aspirin, Ciprofloxacin, Clonidine derivatives, Nystatin, Oxycodone, Serevent [salmeterol], and Cephalexin   Assessment / Plan:     Visit Diagnoses: Rheumatoid arthritis of multiple sites with negative rheumatoid factor  -she complains of pain and discomfort in all of her joints.  She had no synovitis on examination.  She has findings of osteoarthritis.  Plan: XR Hand 2 View Right, XR Hand 2 View Left, XR Foot 2 Views Right, XR Foot 2 Views Left.  X-rays obtained today showed findings consistent with rheumatoid arthritis and osteoarthritis.  No comparison x-rays were available.  High risk medication use - Humira 40 mg subcutaneous every other week(11/02/2016), Arava 20 mg by mouth daily (12/13/2016) (inadequate response to Enbrel, side effects from methotrexate).  Her labs have been stable.  We will check her labs again  in September and then every 3 months to monitor for drug toxicity.  Fibromyalgia-she has generalized hyperalgesia and positive tender points.  She has been going to pain management.  She states she was given Flexeril which has been helpful.  History of rotator cuff surgery-she has discomfort range of motion of her shoulders.  Trochanteric bursitis, left hip-she has been experiencing increased discomfort in her left trochanteric area.  I have given her IT band exercises.  She is diabetic, I would avoid cortisone injection.  Primary insomnia-sleeping better since she she is on Flexeril and antidepressants.  Positive ANA (antinuclear antibody)-she has no clinical features of lupus.  Other medical problems are listed as follows:  Dyslipidemia  History of diabetes mellitus  Chronic obstructive pulmonary disease, unspecified COPD type ([place])  History of hypothyroidism  History of renal cell carcinoma  History of gastroesophageal reflux (GERD)  COVID-19-she has had COVID-19 vaccinations.  I discussed the need for getting booster.  She has been advised to continue to wear mask, practice social distancing and handwashing despite being fully vaccinated.  If she develops COVID-19 infection she may be a candidate for monoclonal antibody infusion.  Instructions were placed in the AVS.  Orders: Orders Placed This Encounter  Procedures  . XR Hand 2 View Right  . XR Hand 2 View Left  . XR Foot 2 Views Right  . XR Foot 2 Views Left   No orders of the defined types were placed in this encounter.   Follow-Up Instructions: Return in about 5 months (around 09/14/2020) for Rheumatoid arthritis.   Bo Merino, MD  Note - This record has been created using Editor, commissioning.  Chart creation errors have been sought, but may not always  have been located. Such creation errors do not reflect on  the standard of medical care.

## 2020-04-14 ENCOUNTER — Ambulatory Visit: Payer: Self-pay

## 2020-04-14 ENCOUNTER — Encounter: Payer: Self-pay | Admitting: Physician Assistant

## 2020-04-14 ENCOUNTER — Other Ambulatory Visit: Payer: Self-pay

## 2020-04-14 ENCOUNTER — Ambulatory Visit (INDEPENDENT_AMBULATORY_CARE_PROVIDER_SITE_OTHER): Payer: Medicare Other | Admitting: Rheumatology

## 2020-04-14 VITALS — BP 116/72 | HR 75 | Resp 16 | Ht 61.0 in | Wt 225.0 lb

## 2020-04-14 DIAGNOSIS — Z8639 Personal history of other endocrine, nutritional and metabolic disease: Secondary | ICD-10-CM

## 2020-04-14 DIAGNOSIS — Z8719 Personal history of other diseases of the digestive system: Secondary | ICD-10-CM

## 2020-04-14 DIAGNOSIS — J449 Chronic obstructive pulmonary disease, unspecified: Secondary | ICD-10-CM

## 2020-04-14 DIAGNOSIS — F5101 Primary insomnia: Secondary | ICD-10-CM

## 2020-04-14 DIAGNOSIS — R768 Other specified abnormal immunological findings in serum: Secondary | ICD-10-CM

## 2020-04-14 DIAGNOSIS — M797 Fibromyalgia: Secondary | ICD-10-CM

## 2020-04-14 DIAGNOSIS — E785 Hyperlipidemia, unspecified: Secondary | ICD-10-CM

## 2020-04-14 DIAGNOSIS — M7062 Trochanteric bursitis, left hip: Secondary | ICD-10-CM

## 2020-04-14 DIAGNOSIS — M0609 Rheumatoid arthritis without rheumatoid factor, multiple sites: Secondary | ICD-10-CM

## 2020-04-14 DIAGNOSIS — Z79899 Other long term (current) drug therapy: Secondary | ICD-10-CM

## 2020-04-14 DIAGNOSIS — Z85528 Personal history of other malignant neoplasm of kidney: Secondary | ICD-10-CM

## 2020-04-14 DIAGNOSIS — Z9889 Other specified postprocedural states: Secondary | ICD-10-CM

## 2020-04-14 NOTE — Patient Instructions (Addendum)
COVID-19 vaccine recommendations:   COVID-19 vaccine is recommended for everyone (unless you are allergic to a vaccine component), even if you are on a medication that suppresses your immune system.   If you are on Methotrexate, Cellcept (mycophenolate), Rinvoq, Morrie Sheldon, and Olumiant- hold the medication for 1 week after each vaccine. Hold Methotrexate for 2 weeks after the single dose COVID-19 vaccine.   If you are on Orencia subcutaneous injection - hold medication one week prior to and one week after the first COVID-19 vaccine dose (only).   If you are on Orencia IV infusions- time vaccination administration so that the first COVID-19 vaccination will occur four weeks after the infusion and postpone the subsequent infusion by one week.   If you are on Cyclophosphamide or Rituxan infusions please contact your doctor prior to receiving the COVID-19 vaccine.   Do not take Tylenol or any anti-inflammatory medications (NSAIDs) 24 hours prior to the COVID-19 vaccination.   There is no direct evidence about the efficacy of the COVID-19 vaccine in individuals who are on medications that suppress the immune system.   Even if you are fully vaccinated, and you are on any medications that suppress your immune system, please continue to wear a mask, maintain at least six feet social distance and practice hand hygiene.   If you develop a COVID-19 infection, please contact your PCP or our office to determine if you need antibody infusion.  The booster vaccine is now available for immunocompromised patients. It is advised that if you had Pfizer vaccine you should get Coca-Cola booster.  If you had a Moderna vaccine then you should get a Moderna booster. Johnson and Wynetta Emery does not have a booster vaccine at this time.  Please see the following web sites for updated information.    https://www.rheumatology.org/Portals/0/Files/COVID-19-Vaccination-Patient-Resources.pdf  https://www.rheumatology.org/About-Us/Newsroom/Press-Releases/ID/1159      Iliotibial Band Syndrome Rehab Ask your health care provider which exercises are safe for you. Do exercises exactly as told by your health care provider and adjust them as directed. It is normal to feel mild stretching, pulling, tightness, or discomfort as you do these exercises. Stop right away if you feel sudden pain or your pain gets significantly worse. Do not begin these exercises until told by your health care provider. Stretching and range-of-motion exercises These exercises warm up your muscles and joints and improve the movement and flexibility of your hip and pelvis. Quadriceps stretch, prone  1. Lie on your abdomen on a firm surface, such as a bed or padded floor (prone position). 2. Bend your left / right knee and reach back to hold your ankle or pant leg. If you cannot reach your ankle or pant leg, loop a belt around your foot and grab the belt instead. 3. Gently pull your heel toward your buttocks. Your knee should not slide out to the side. You should feel a stretch in the front of your thigh and knee (quadriceps). 4. Hold this position for __________ seconds. Repeat __________ times. Complete this exercise __________ times a day. Iliotibial band stretch An iliotibial band is a strong band of muscle tissue that runs from the outer side of your hip to the outer side of your thigh and knee. 1. Lie on your side with your left / right leg in the top position. 2. Bend both of your knees and grab your left / right ankle. Stretch out your bottom arm to help you balance. 3. Slowly bring your top knee back so your thigh goes behind your trunk. 4.  Slowly lower your top leg toward the floor until you feel a gentle stretch on the outside of your left / right hip and thigh. If you do not feel a stretch and your knee will not  fall farther, place the heel of your other foot on top of your knee and pull your knee down toward the floor with your foot. 5. Hold this position for __________ seconds. Repeat __________ times. Complete this exercise __________ times a day. Strengthening exercises These exercises build strength and endurance in your hip and pelvis. Endurance is the ability to use your muscles for a long time, even after they get tired. Straight leg raises, side-lying This exercise strengthens the muscles that rotate the leg at the hip and move it away from your body (hip abductors). 1. Lie on your side with your left / right leg in the top position. Lie so your head, shoulder, hip, and knee line up. You may bend your bottom knee to help you balance. 2. Roll your hips slightly forward so your hips are stacked directly over each other and your left / right knee is facing forward. 3. Tense the muscles in your outer thigh and lift your top leg 4-6 inches (10-15 cm). 4. Hold this position for __________ seconds. 5. Slowly return to the starting position. Let your muscles relax completely before doing another repetition. Repeat __________ times. Complete this exercise __________ times a day. Leg raises, prone This exercise strengthens the muscles that move the hips (hip extensors). 1. Lie on your abdomen on your bed or a firm surface. You can put a pillow under your hips if that is more comfortable for your lower back. 2. Bend your left / right knee so your foot is straight up in the air. 3. Squeeze your buttocks muscles and lift your left / right thigh off the bed. Do not let your back arch. 4. Tense your thigh muscle as hard as you can without increasing any knee pain. 5. Hold this position for __________ seconds. 6. Slowly lower your leg to the starting position and allow it to relax completely. Repeat __________ times. Complete this exercise __________ times a day. Hip hike 1. Stand sideways on a bottom step.  Stand on your left / right leg with your other foot unsupported next to the step. You can hold on to the railing or wall for balance if needed. 2. Keep your knees straight and your torso square. Then lift your left / right hip up toward the ceiling. 3. Slowly let your left / right hip lower toward the floor, past the starting position. Your foot should get closer to the floor. Do not lean or bend your knees. Repeat __________ times. Complete this exercise __________ times a day. This information is not intended to replace advice given to you by your health care provider. Make sure you discuss any questions you have with your health care provider. Document Revised: 12/05/2018 Document Reviewed: 06/05/2018 Elsevier Patient Education  [place] We placed an order today for your standing lab work.   Please have your standing labs drawn in September and every 3 months  If possible, please have your labs drawn 2 weeks prior to your appointment so that the provider can discuss your results at your appointment.  We have open lab daily Monday through Thursday from 8:30-12:30 PM and 1:30-4:30 PM and Friday from 8:30-12:30 PM and 1:30-4:00 PM at the office of Dr. Bo Merino, [facility].   Please  be advised, patients with office appointments requiring lab work will take precedents over walk-in lab work.  If possible, please come for your lab work on Monday and Friday afternoons, as you may experience shorter wait times. The office is located at [address] [place], [place], [place] [address] No appointment is necessary.   Labs are drawn by [facility]. Please bring your co-pay at the time of your lab draw.  You may receive a bill from [facility] for your lab work.  If you wish to have your labs drawn at another location, please call the office 24 hours in advance to send orders.  If you have any questions regarding directions or hours of operation,  please call  [phone].   As a reminder, please drink plenty of water prior to coming for your lab work. Thanks!

## 2020-04-15 MED FILL — HUMIRA PEN 40 MG/0.8ML PNKT: 40 | 28 days supply | Qty: 2 | Fill #1

## 2020-04-21 ENCOUNTER — Ambulatory Visit: Payer: Self-pay

## 2020-05-06 ENCOUNTER — Encounter: Payer: Self-pay | Admitting: Allergy and Immunology

## 2020-05-07 DIAGNOSIS — J45909 Unspecified asthma, uncomplicated: Secondary | ICD-10-CM | POA: Insufficient documentation

## 2020-05-07 DIAGNOSIS — E8770 Fluid overload, unspecified: Secondary | ICD-10-CM | POA: Insufficient documentation

## 2020-05-07 HISTORY — DX: Fluid overload, unspecified: E87.70

## 2020-05-10 DIAGNOSIS — J358 Other chronic diseases of tonsils and adenoids: Secondary | ICD-10-CM | POA: Insufficient documentation

## 2020-05-10 DIAGNOSIS — D500 Iron deficiency anemia secondary to blood loss (chronic): Secondary | ICD-10-CM

## 2020-05-10 DIAGNOSIS — J351 Hypertrophy of tonsils: Secondary | ICD-10-CM | POA: Insufficient documentation

## 2020-05-10 DIAGNOSIS — R609 Edema, unspecified: Secondary | ICD-10-CM

## 2020-05-10 DIAGNOSIS — R040 Epistaxis: Secondary | ICD-10-CM

## 2020-05-10 DIAGNOSIS — G43909 Migraine, unspecified, not intractable, without status migrainosus: Secondary | ICD-10-CM

## 2020-05-10 DIAGNOSIS — E7800 Pure hypercholesterolemia, unspecified: Secondary | ICD-10-CM

## 2020-05-10 DIAGNOSIS — D219 Benign neoplasm of connective and other soft tissue, unspecified: Secondary | ICD-10-CM | POA: Insufficient documentation

## 2020-05-10 DIAGNOSIS — M940 Chondrocostal junction syndrome [Tietze]: Secondary | ICD-10-CM | POA: Insufficient documentation

## 2020-05-10 HISTORY — DX: Epistaxis: R04.0

## 2020-05-10 HISTORY — DX: Iron deficiency anemia secondary to blood loss (chronic): D50.0

## 2020-05-10 HISTORY — DX: Edema, unspecified: R60.9

## 2020-05-10 HISTORY — DX: Other chronic diseases of tonsils and adenoids: J35.8

## 2020-05-10 HISTORY — DX: Benign neoplasm of connective and other soft tissue, unspecified: D21.9

## 2020-05-10 HISTORY — DX: Pure hypercholesterolemia, unspecified: E78.00

## 2020-05-10 HISTORY — DX: Hypertrophy of tonsils: J35.1

## 2020-05-10 HISTORY — DX: Migraine, unspecified, not intractable, without status migrainosus: G43.909

## 2020-05-11 DIAGNOSIS — E876 Hypokalemia: Secondary | ICD-10-CM | POA: Insufficient documentation

## 2020-05-11 HISTORY — DX: Hypokalemia: E87.6

## 2020-05-19 ENCOUNTER — Ambulatory Visit (INDEPENDENT_AMBULATORY_CARE_PROVIDER_SITE_OTHER): Payer: Medicare Other | Admitting: Allergy and Immunology

## 2020-05-19 ENCOUNTER — Other Ambulatory Visit: Payer: Self-pay

## 2020-05-19 VITALS — BP 104/60 | HR 84 | Resp 22

## 2020-05-19 DIAGNOSIS — K219 Gastro-esophageal reflux disease without esophagitis: Secondary | ICD-10-CM | POA: Diagnosis not present

## 2020-05-19 DIAGNOSIS — J4550 Severe persistent asthma, uncomplicated: Secondary | ICD-10-CM

## 2020-05-19 DIAGNOSIS — J3089 Other allergic rhinitis: Secondary | ICD-10-CM

## 2020-05-19 MED ORDER — BREO ELLIPTA 200-25 MCG/INH IN AEPB: INHALATION_SPRAY | RESPIRATORY_TRACT | 5 refills | Status: DC

## 2020-05-19 MED ORDER — SPIRIVA RESPIMAT 1.25 MCG/ACT IN AERS: INHALATION_SPRAY | RESPIRATORY_TRACT | 5 refills | Status: DC

## 2020-05-19 NOTE — Progress Notes (Signed)
[facility]   Follow-up Note  Referring Provider: Clancy Gourd, NP Primary Provider: Clancy Gourd, NP Date of Office Visit: 05/19/2020  Subjective:   Kimberly Jenkins (DOB: 08-Oct-1965) is a 54 y.o. female who returns to the [facility] on 05/19/2020 in re-evaluation of the following:  HPI: Kimberly Jenkins returns to this clinic in reevaluation of severe eosinophilic asthma treated with mepolizumab, allergic rhinitis, and LPR.  I last saw her in this clinic on 18 March 2020.  During the interval she contracted Covid manifested as chest congestion and coughing and wheezing.  She was Covid swab positive on 19 April 2020.  She had a very severe illness for about 2 weeks although she did not require hospitalization.  She has improved significantly but still has some wheezing and coughing and chest congestion and feels out of breath when she exerts herself.  She occasionally runs out of her asthma medications and she has been inconsistent with receiving her mepolizumab.  She has had very little issues with her nose or her reflux.  She has been having problems with tremor and she is scheduled to see a neurologist.  She has received 2 Covid vaccinations in the past.  Allergies as of 05/19/2020      Reactions   Methotrexate Diarrhea   Advair Diskus [fluticasone-salmeterol]    Aspirin    Ciprofloxacin    Clonidine Derivatives    Nystatin    Oxycodone    Serevent [salmeterol]    Cephalexin Nausea And Vomiting      Medication List      Breo Ellipta 200-25 MCG/INH Aepb Generic drug: fluticasone furoate-vilanterol SMARTSIG:1 Inhalation By Mouth Daily   busPIRone 15 MG tablet Commonly known as: BUSPAR Take one tablet three times daily.   cetirizine 10 MG tablet Commonly known as: ZYRTEC Take 10 mg by mouth daily.   clorazepate 3.75 MG tablet Commonly known as: TRANXENE Take one tablet four times a day as  needed.   Combivent Respimat 20-100 MCG/ACT Aers respimat Generic drug: Ipratropium-Albuterol Inhale 1 puff into the lungs every 6 (six) hours as needed for wheezing.   ipratropium-albuterol 0.5-2.5 (3) MG/3ML Soln Commonly known as: DUONEB CAN USE ONE VIAL VIA NEBULIZER EVERY 4-6 HOURS AS NEEDED FOR COUGH OR WHEEZE   cyclobenzaprine 10 MG tablet Commonly known as: FLEXERIL Take 10 mg by mouth at bedtime.   donepezil 10 MG tablet Commonly known as: ARICEPT Take 10 mg by mouth daily.   Easy Touch Pen Needles 31G X 8 MM Misc Generic drug: Insulin Pen Needle   EPINEPHrine 0.3 mg/0.3 mL Soaj injection Commonly known as: EpiPen 2-Pak Use as directed for life-threatening allergic reaction.   ezetimibe 10 MG tablet Commonly known as: ZETIA Take 10 mg by mouth daily.   famotidine 20 MG tablet Commonly known as: PEPCID Take 1 tablet by mouth at bedtime.   FISH OIL PO Take by mouth.   FLUoxetine 20 MG capsule Commonly known as: PROZAC Take 60 mg by mouth daily.   fluticasone 50 MCG/ACT nasal spray Commonly known as: FLONASE Place 2 sprays into both nostrils daily.   folic acid 1 MG tablet Commonly known as: FOLVITE TAKE 2 TABLETS BY MOUTH EVERY DAY   furosemide 40 MG tablet Commonly known as: LASIX Take 20 mg by mouth every other day.   gabapentin 600 MG tablet Commonly known as: NEURONTIN Take 600 mg by mouth 3 (three) times daily.  Humira Pen 40 MG/0.8ML Pnkt Generic drug: Adalimumab INJECT 40 MG INTO THE SKIN EVERY 14 DAYS.   HYDROcodone-acetaminophen 10-325 MG tablet Commonly known as: NORCO Take by mouth.   lamoTRIgine 25 MG tablet Commonly known as: LAMICTAL Take 25 mg by mouth 2 (two) times daily.   Lantus SoloStar 100 UNIT/ML Solostar Pen Generic drug: insulin glargine as needed.   leflunomide 10 MG tablet Commonly known as: ARAVA Take 2 tablets (20 mg total) by mouth daily.   levothyroxine 50 MCG tablet Commonly known as: SYNTHROID TAKE 1  TABLET BY MOUTH EVERY DAY AT 6 AM   Linzess 72 MCG capsule Generic drug: linaclotide Take 72 mcg by mouth daily.   metFORMIN 500 MG tablet Commonly known as: GLUCOPHAGE Take 500 mg by mouth 2 (two) times daily with a meal.   montelukast 10 MG tablet Commonly known as: SINGULAIR Take 1 tablet (10 mg total) by mouth daily.   Nucala 100 MG Solr Generic drug: Mepolizumab   nystatin cream Commonly known as: MYCOSTATIN as needed.   olopatadine 0.1 % ophthalmic solution Commonly known as: PATANOL   omeprazole 40 MG capsule Commonly known as: PRILOSEC Take 40 mg by mouth 2 (two) times daily.   phentermine 37.5 MG tablet Commonly known as: ADIPEX-P Take 37.5 mg by mouth daily.   potassium chloride SA 20 MEQ tablet Commonly known as: KLOR-CON Take by mouth.   pramipexole 0.125 MG tablet Commonly known as: MIRAPEX Take by mouth.   pravastatin 40 MG tablet Commonly known as: PRAVACHOL Take 40 mg by mouth daily.   progesterone 100 MG capsule Commonly known as: PROMETRIUM Take 200 mg by mouth daily.   Rexulti 2 MG Tabs tablet Generic drug: brexpiprazole Take 2 mg by mouth daily.   rizatriptan 10 MG tablet Commonly known as: MAXALT Take 10 mg by mouth as needed.   Spiriva Respimat 1.25 MCG/ACT Aers Generic drug: Tiotropium Bromide Monohydrate SMARTSIG:2 Puff(s) By Mouth Daily   topiramate 25 MG tablet Commonly known as: TOPAMAX Take 75 mg by mouth daily.   triamcinolone cream 0.1 % Commonly known as: KENALOG as needed.   VICTOZA [place] Inject into the skin.   Vitamin D (Ergocalciferol) 1.25 MG (50000 UNIT) Caps capsule Commonly known as: DRISDOL Take by mouth every 7 (seven) days.       Past Medical History:  Diagnosis Date  . Allergic rhinitis   . Asthma   . Cancer of kidney ([place])   . COPD (chronic obstructive pulmonary disease) ([place])   . Dehydration   . GERD (gastroesophageal reflux disease)   . Hypothyroidism   . Kidney disease   . Rheumatoid  arthritis ([place])   . Sleep apnea   . Tremor     Past Surgical History:  Procedure Laterality Date  . APPENDECTOMY    . CESAREAN SECTION     2 times  . CHOLECYSTECTOMY    . COLONOSCOPY W/ POLYPECTOMY  2021  . ESOPHAGEAL DILATION  2021  . KIDNEY SURGERY    . SHOULDER SURGERY    . TONSILLECTOMY    . TOTAL SHOULDER ARTHROPLASTY    . TUBAL LIGATION      Review of systems negative except as noted in HPI / PMHx or noted below:  Review of Systems  Constitutional: Negative.   HENT: Negative.   Eyes: Negative.   Respiratory: Negative.   Cardiovascular: Negative.   Gastrointestinal: Negative.   Genitourinary: Negative.   Musculoskeletal: Negative.   Skin: Negative.   Neurological: Negative.  Endo/Heme/Allergies: Negative.   Psychiatric/Behavioral: Negative.      Objective:   Vitals:   05/19/20 1127  BP: 104/60  Pulse: 84  Resp: (!) 22  SpO2: 98%          Physical Exam Constitutional:      Appearance: She is not diaphoretic.  HENT:     Head: Normocephalic.     Right Ear: Tympanic membrane, ear canal and external ear normal.     Left Ear: Tympanic membrane, ear canal and external ear normal.     Nose: Nose normal. No mucosal edema or rhinorrhea.     Mouth/Throat:     Pharynx: Uvula midline. No oropharyngeal exudate.  Eyes:     Conjunctiva/sclera: Conjunctivae normal.  Neck:     Thyroid: No thyromegaly.     Trachea: Trachea normal. No tracheal tenderness or tracheal deviation.  Cardiovascular:     Rate and Rhythm: Normal rate and regular rhythm.     Heart sounds: Normal heart sounds, S1 normal and S2 normal. No murmur heard.   Pulmonary:     Effort: No respiratory distress.     Breath sounds: Normal breath sounds. No stridor. No wheezing or rales.  Lymphadenopathy:     Head:     Right side of head: No tonsillar adenopathy.     Left side of head: No tonsillar adenopathy.     Cervical: No cervical adenopathy.  Skin:    Findings: No erythema or rash.      Nails: There is no clubbing.  Neurological:     Mental Status: She is alert.     Diagnostics:    Spirometry was performed and demonstrated an FEV1 of 0.64 at 27 % of predicted.  Oxygen saturation was 98% on room air at rest.  Oxygen saturation was 97% on room air while walking the hallway.  Assessment and Plan:   1. Not well controlled severe persistent asthma   2. Other allergic rhinitis   3. LPRD (laryngopharyngeal reflux disease)   4. Severe persistent asthma without complication     1. Continue Breo 200 - 1 inhalation +  Spiriva 1.25 Respimat - 2 inhalations daily consistently  2. Continue Mepolizumab injections every 4 weeks consistently (Injection today)  3. Continue Montelukast - 10 one tablet one time per day  4. Continue Flonase - 1-2 sprays each nostril 1 time per day  5. Continue Omeprazole 40 one tablet two times per day + Pepcid 20 mg in evening  6. Continue Zyrtec 10 one tablet one time per day  7. Use Duoneb nebulization or Combivent two puffs every 4-6 hours if needed.  8. Return to clinic in 12 weeks or earlier if problem  9. Obtain fall flu vaccine  We will have Beverlee Nims restart consistent use of her controller agents including a combination of Breo and Spiriva and mepolizumab in addition to montelukast and Flonase which should help clear out some of the Covid induced inflammation that developed in the setting of her severe eosinophilic driven asthma.  She will also continue to treat her reflux as noted above.  Assuming she does well I will see her back in his clinic in 12 weeks or earlier if there is a problem.  Allena Katz, MD Allergy / Immunology [place]

## 2020-05-19 NOTE — Patient Instructions (Addendum)
1. Continue Breo 200 - 1 inhalation +  Spiriva 1.25 Respimat - 2 inhalations daily consistently  2. Continue Mepolizumab injections every 4 weeks consistently (Injection today)  3. Continue Montelukast - 10 one tablet one time per day  4. Continue Flonase - 1-2 sprays each nostril 1 time per day  5. Continue Omeprazole 40 one tablet two times per day + Pepcid 20 mg in evening  6. Continue Zyrtec 10 one tablet one time per day  7. Use Duoneb nebulization or Combivent two puffs every 4-6 hours if needed.  8. Return to clinic in 12 weeks or earlier if problem  9. Obtain fall flu vaccine

## 2020-05-20 ENCOUNTER — Encounter: Payer: Self-pay | Admitting: Allergy and Immunology

## 2020-05-20 MED FILL — HUMIRA PEN 40 MG/0.8ML PNKT: 40 | 28 days supply | Qty: 2 | Fill #2

## 2020-06-10 ENCOUNTER — Ambulatory Visit: Payer: Medicare Other | Admitting: Allergy and Immunology

## 2020-06-16 ENCOUNTER — Other Ambulatory Visit: Payer: Self-pay

## 2020-06-16 ENCOUNTER — Ambulatory Visit (INDEPENDENT_AMBULATORY_CARE_PROVIDER_SITE_OTHER): Payer: Medicare Other

## 2020-06-16 DIAGNOSIS — J4550 Severe persistent asthma, uncomplicated: Secondary | ICD-10-CM | POA: Diagnosis not present

## 2020-06-17 ENCOUNTER — Other Ambulatory Visit: Payer: Self-pay | Admitting: Physician Assistant

## 2020-06-17 NOTE — Telephone Encounter (Signed)
Last Visit: 04/14/2020 Next Visit: 09/15/2020 Labs: 02/25/2020 CBC is normal. Glucose is elevated. GFR is low. TB Gold: 11/11/2019 Neg   Current Dose per office on 04/14/2020: Humira 40 mg subcutaneous every other week Dx: Rheumatoid arthritis of multiple sites with negative rheumatoid factor    Patient advised she is due to update labs. Patient states she will try to come into office tomorrow to update.   Okay to refill Humira?

## 2020-06-18 ENCOUNTER — Other Ambulatory Visit: Payer: Self-pay

## 2020-06-18 DIAGNOSIS — Z79899 Other long term (current) drug therapy: Secondary | ICD-10-CM

## 2020-06-19 LAB — COMPLETE METABOLIC PANEL WITH GFR
AG Ratio: 1.4 (calc) (ref 1.0–2.5)
ALT: 27 U/L (ref 6–29)
AST: 23 U/L (ref 10–35)
Albumin: 3.7 g/dL (ref 3.6–5.1)
Alkaline phosphatase (APISO): 103 U/L (ref 37–153)
BUN/Creatinine Ratio: 12 (calc) (ref 6–22)
BUN: 13 mg/dL (ref 7–25)
CO2: 26 mmol/L (ref 20–32)
Calcium: 8.7 mg/dL (ref 8.6–10.4)
Chloride: 106 mmol/L (ref 98–110)
Creat: 1.1 mg/dL — ABNORMAL HIGH (ref 0.50–1.05)
GFR, Est African American: 65 mL/min/{1.73_m2} (ref >=60)
GFR, Est Non African American: 56 mL/min/{1.73_m2} — ABNORMAL LOW (ref >=60)
Globulin: 2.7 g/dL (calc) (ref 1.9–3.7)
Glucose, Bld: 186 mg/dL — ABNORMAL HIGH (ref 65–99)
Potassium: 3.8 mmol/L (ref 3.5–5.3)
Sodium: 137 mmol/L (ref 135–146)
Total Bilirubin: 0.3 mg/dL (ref 0.2–1.2)
Total Protein: 6.4 g/dL (ref 6.1–8.1)

## 2020-06-19 LAB — CBC WITH DIFFERENTIAL/PLATELET
Absolute Monocytes: 744 cells/uL (ref 200–950)
Basophils Absolute: 43 cells/uL (ref 0–200)
Basophils Relative: 0.7 %
Eosinophils Absolute: 61 cells/uL (ref 15–500)
Eosinophils Relative: 1 %
HCT: 36.6 % (ref 35.0–45.0)
Hemoglobin: 11.7 g/dL (ref 11.7–15.5)
Lymphs Abs: 1726 cells/uL (ref 850–3900)
MCH: 27.9 pg (ref 27.0–33.0)
MCHC: 32 g/dL (ref 32.0–36.0)
MCV: 87.1 fL (ref 80.0–100.0)
MPV: 10.6 fL (ref 7.5–12.5)
Monocytes Relative: 12.2 %
Neutro Abs: 3526 cells/uL (ref 1500–7800)
Neutrophils Relative %: 57.8 %
Platelets: 230 10*3/uL (ref 140–400)
RBC: 4.2 10*6/uL (ref 3.80–5.10)
RDW: 14.7 % (ref 11.0–15.0)
Total Lymphocyte: 28.3 %
WBC: 6.1 10*3/uL (ref 3.8–10.8)

## 2020-06-19 NOTE — Progress Notes (Signed)
Glucose is elevated.  Creatinine is elevated but stable.  CBC is normal.  Please forward results to her PCP.

## 2020-06-28 MED FILL — HUMIRA PEN 40 MG/0.8ML PNKT: 40 | 28 days supply | Qty: 2 | Fill #0

## 2020-07-12 ENCOUNTER — Other Ambulatory Visit: Payer: Self-pay | Admitting: Physician Assistant

## 2020-07-12 NOTE — Telephone Encounter (Signed)
Last Visit: 04/14/2020 Next Visit: 09/15/2020 Labs: 06/18/2020 Glucose is elevated. Creatinine is elevated but stable. CBC is normal TB Gold: 11/11/2019 Neg   Current Dose per office on 04/14/2020: Humira 40 mg subcutaneous every other week Dx: Rheumatoid arthritis of multiple sites with negative rheumatoid factor   Okay to refill Humira?

## 2020-07-14 ENCOUNTER — Ambulatory Visit (INDEPENDENT_AMBULATORY_CARE_PROVIDER_SITE_OTHER): Payer: Medicare Other

## 2020-07-14 ENCOUNTER — Other Ambulatory Visit: Payer: Self-pay

## 2020-07-14 DIAGNOSIS — J4550 Severe persistent asthma, uncomplicated: Secondary | ICD-10-CM | POA: Diagnosis not present

## 2020-07-26 MED FILL — HUMIRA PEN 40 MG/0.8ML PNKT: 40 | 28 days supply | Qty: 2 | Fill #0

## 2020-08-11 ENCOUNTER — Encounter: Payer: Self-pay | Admitting: Allergy and Immunology

## 2020-08-11 ENCOUNTER — Ambulatory Visit (INDEPENDENT_AMBULATORY_CARE_PROVIDER_SITE_OTHER): Payer: Medicare Other | Admitting: Allergy and Immunology

## 2020-08-11 ENCOUNTER — Other Ambulatory Visit: Payer: Self-pay

## 2020-08-11 ENCOUNTER — Ambulatory Visit: Payer: Self-pay

## 2020-08-11 VITALS — BP 122/54 | HR 84 | Resp 16

## 2020-08-11 DIAGNOSIS — J3089 Other allergic rhinitis: Secondary | ICD-10-CM | POA: Diagnosis not present

## 2020-08-11 DIAGNOSIS — R682 Dry mouth, unspecified: Secondary | ICD-10-CM | POA: Diagnosis not present

## 2020-08-11 DIAGNOSIS — J4550 Severe persistent asthma, uncomplicated: Secondary | ICD-10-CM

## 2020-08-11 DIAGNOSIS — K219 Gastro-esophageal reflux disease without esophagitis: Secondary | ICD-10-CM

## 2020-08-11 NOTE — Progress Notes (Signed)
[place] [facility] [place] [facility] [place]   Follow-up Note  Referring Provider: Clancy Gourd, NP Primary Provider: Clancy Gourd, NP Date of Office Visit: 08/11/2020  Subjective:   Kimberly Jenkins (DOB: 13-Dec-1965) is a 54 y.o. female who returns to the [facility] on 08/11/2020 in re-evaluation of the following:  HPI: Kimberly Jenkins returns to this clinic in reevaluation of severe eosinophilic asthma treated with mepolizumab, allergic rhinitis, and LPR. Her last visit to this clinic was 19 May 2020.   She has really done well since her last visit while consistently using medications directed against inflammation and reflux.  She has resolved the majority of her chest congestion and coughing and wheezing and shortness of breath that appeared to be exacerbated by her Covid infection in August 2021.  She still uses a Combivent about 2 times per day.  She has had very little issues with her nose.  She has had no issues with reflux.  She has been having a dry mouth which has been a longstanding issue even prior to her last visit.  She has received 3 Pfizer Covid vaccines and the flu vaccine and has received her first shingles vaccine.  When I last saw her in his clinic she was having tremor and she visited with a neurologist and is now using primidone successfully for this tremor.  She states that she had a mammogram which identified an abnormality on her right breast and she is scheduled to have an ultrasound today.  Allergies as of 08/11/2020      Reactions   Methotrexate Diarrhea   Advair Diskus [fluticasone-salmeterol]    Aspirin    Ciprofloxacin    Clonidine Derivatives    Nystatin    Oxycodone    Serevent [salmeterol]    Cephalexin Nausea And Vomiting      Medication List    amphetamine-dextroamphetamine 5 MG tablet Commonly known as: ADDERALL Take 1 tablet by mouth daily.   Breo Ellipta 200-25 MCG/INH Aepb Generic  drug: fluticasone furoate-vilanterol Inhale one puff once daily   busPIRone 15 MG tablet Commonly known as: BUSPAR Take one tablet three times daily.   cetirizine 10 MG tablet Commonly known as: ZYRTEC Take 10 mg by mouth daily.   clorazepate 3.75 MG tablet Commonly known as: TRANXENE Take one tablet four times a day as needed.   cyclobenzaprine 10 MG tablet Commonly known as: FLEXERIL Take 10 mg by mouth at bedtime.   donepezil 10 MG tablet Commonly known as: ARICEPT Take 10 mg by mouth daily.   EPINEPHrine 0.3 mg/0.3 mL Soaj injection Commonly known as: EpiPen 2-Pak Use as directed for life-threatening allergic reaction.   ezetimibe 10 MG tablet Commonly known as: ZETIA Take 10 mg by mouth daily.   famotidine 20 MG tablet Commonly known as: PEPCID Take 1 tablet by mouth at bedtime.   FISH OIL PO Take by mouth.   FLUoxetine 20 MG capsule Commonly known as: PROZAC Take 60 mg by mouth daily.   fluticasone 50 MCG/ACT nasal spray Commonly known as: FLONASE Place 2 sprays into both nostrils daily.   folic acid 1 MG tablet Commonly known as: FOLVITE TAKE 2 TABLETS BY MOUTH EVERY DAY   furosemide 40 MG tablet Commonly known as: LASIX Take 20 mg by mouth every other day.   gabapentin 600 MG tablet Commonly known as: NEURONTIN Take 600 mg by mouth 3 (three) times daily.   Humira Pen 40 MG/0.8ML Pnkt Generic drug:  Adalimumab INJECT 40 MG INTO THE SKIN EVERY 14 DAYS.   HYDROcodone-acetaminophen 10-325 MG tablet Commonly known as: NORCO Take by mouth.   insulin glargine 100 UNIT/ML Solostar Pen Commonly known as: LANTUS as needed.   Insulin Pen Needle 31G X 8 MM Misc   Ipratropium-Albuterol 20-100 MCG/ACT Aers respimat Commonly known as: COMBIVENT Inhale 1 puff into the lungs every 6 (six) hours as needed for wheezing.   ipratropium-albuterol 0.5-2.5 (3) MG/3ML Soln Commonly known as: DUONEB CAN USE ONE VIAL VIA NEBULIZER EVERY 4-6 HOURS AS NEEDED  FOR COUGH OR WHEEZE   lamoTRIgine 25 MG tablet Commonly known as: LAMICTAL Take 25 mg by mouth 2 (two) times daily.   leflunomide 10 MG tablet Commonly known as: ARAVA Take 2 tablets (20 mg total) by mouth daily.   levothyroxine 50 MCG tablet Commonly known as: SYNTHROID TAKE 1 TABLET BY MOUTH EVERY DAY AT 6 AM   Linzess 72 MCG capsule Generic drug: linaclotide Take 72 mcg by mouth daily.   metFORMIN 500 MG tablet Commonly known as: GLUCOPHAGE Take 500 mg by mouth 2 (two) times daily with a meal.   montelukast 10 MG tablet Commonly known as: SINGULAIR Take 1 tablet (10 mg total) by mouth daily.   Nucala 100 MG Solr Generic drug: Mepolizumab   nystatin cream Commonly known as: MYCOSTATIN as needed.   olopatadine 0.1 % ophthalmic solution Commonly known as: PATANOL   omeprazole 40 MG capsule Commonly known as: PRILOSEC Take 40 mg by mouth 2 (two) times daily.   potassium chloride SA 20 MEQ tablet Commonly known as: KLOR-CON Take by mouth.   pramipexole 0.125 MG tablet Commonly known as: MIRAPEX Take by mouth.   pravastatin 40 MG tablet Commonly known as: PRAVACHOL Take 40 mg by mouth daily.   progesterone 100 MG capsule Commonly known as: PROMETRIUM Take 200 mg by mouth daily.   Rexulti 2 MG Tabs tablet Generic drug: brexpiprazole Take 2 mg by mouth daily.   rizatriptan 10 MG tablet Commonly known as: MAXALT Take 10 mg by mouth as needed.   Spiriva Respimat 1.25 MCG/ACT Aers Generic drug: Tiotropium Bromide Monohydrate Inhale two puffs once daily   topiramate 25 MG tablet Commonly known as: TOPAMAX Take 75 mg by mouth daily.   triamcinolone 0.1 % Commonly known as: KENALOG as needed.   VICTOZA [place] Inject into the skin.   Vitamin D (Ergocalciferol) 1.25 MG (50000 UNIT) Caps capsule Commonly known as: DRISDOL Take by mouth every 7 (seven) days.       Past Medical History:  Diagnosis Date  . Allergic rhinitis   . Asthma   . Cancer of  kidney ([place])   . COPD (chronic obstructive pulmonary disease) ([place])   . Dehydration   . GERD (gastroesophageal reflux disease)   . Hypothyroidism   . Kidney disease   . Rheumatoid arthritis ([place])   . Sleep apnea   . Tremor     Past Surgical History:  Procedure Laterality Date  . APPENDECTOMY    . CESAREAN SECTION     2 times  . CHOLECYSTECTOMY    . COLONOSCOPY W/ POLYPECTOMY  2021  . ESOPHAGEAL DILATION  2021  . KIDNEY SURGERY    . SHOULDER SURGERY    . TONSILLECTOMY    . TOTAL SHOULDER ARTHROPLASTY    . TUBAL LIGATION      Review of systems negative except as noted in HPI / PMHx or noted below:  Review of Systems  Constitutional: Negative.  HENT: Negative.   Eyes: Negative.   Respiratory: Negative.   Cardiovascular: Negative.   Gastrointestinal: Negative.   Genitourinary: Negative.   Musculoskeletal: Negative.   Skin: Negative.   Neurological: Negative.   Endo/Heme/Allergies: Negative.   Psychiatric/Behavioral: Negative.      Objective:   Vitals:   08/11/20 0815  BP: (!) 122/54  Pulse: 84  Resp: 16  SpO2: 97%          Physical Exam Constitutional:      Appearance: She is not diaphoretic.  HENT:     Head: Normocephalic.     Right Ear: Tympanic membrane, ear canal and external ear normal.     Left Ear: Tympanic membrane, ear canal and external ear normal.     Nose: Nose normal. No mucosal edema or rhinorrhea.     Mouth/Throat:     Mouth: Oropharynx is clear and moist and mucous membranes are normal.     Pharynx: Uvula midline. No oropharyngeal exudate.  Eyes:     Conjunctiva/sclera: Conjunctivae normal.  Neck:     Thyroid: No thyromegaly.     Trachea: Trachea normal. No tracheal tenderness or tracheal deviation.  Cardiovascular:     Rate and Rhythm: Normal rate and regular rhythm.     Heart sounds: Normal heart sounds, S1 normal and S2 normal. No murmur heard.   Pulmonary:     Effort: No respiratory distress.     Breath sounds: Normal  breath sounds. No stridor. No wheezing or rales.  Musculoskeletal:        General: No edema.  Lymphadenopathy:     Head:     Right side of head: No tonsillar adenopathy.     Left side of head: No tonsillar adenopathy.     Cervical: No cervical adenopathy.  Skin:    Findings: No erythema or rash.     Nails: There is no clubbing.  Neurological:     Mental Status: She is alert.     Diagnostics:    Spirometry was performed and demonstrated an FEV1 of 1.53 at 64 % of predicted.  The patient had an Asthma Control Test with the following results: ACT Total Score: 17.    Assessment and Plan:   1. Asthma, severe persistent, well-controlled   2. Other allergic rhinitis   3. LPRD (laryngopharyngeal reflux disease)   4. Dry mouth     1. Continue Breo 200 - 1 inhalation +  Spiriva 1.25 Respimat - 2 inhalations daily   2. Continue Mepolizumab injections every 4 weeks   3. Continue Montelukast - 10 one tablet one time per day  4. Continue Flonase - 1-2 sprays each nostril 1 time per day  5. Continue Omeprazole 40 one tablet two times per day + Pepcid 20 mg in evening  6. Use Duoneb nebulization or Albuterol HFA (NOT Combivent) two puffs every 4-6 hours if needed.  7. DISCONTINUE Zyrtec / cetirizine  8. Is Flexeril or Spiriva causing your dry mouth???  9. Return to clinic in 12 weeks or earlier if problem  At this point Kimberly Jenkins is doing much better and we will keep her on a collection of anti-inflammatory agents as noted above and therapy directed against reflux to address her respiratory tract issues.  She has very dry mouth and we will eliminate her antihistamine and her Combivent and then she needs to work through whether or not Flexeril or Spiriva is contributing to some of her dry mouth.  If she does well I will see  her back in this clinic in 12 weeks or earlier if there is a problem.  Allena Katz, MD Allergy / Immunology [place]

## 2020-08-11 NOTE — Patient Instructions (Addendum)
  1. Continue Breo 200 - 1 inhalation +  Spiriva 1.25 Respimat - 2 inhalations daily   2. Continue Mepolizumab injections every 4 weeks   3. Continue Montelukast - 10 one tablet one time per day  4. Continue Flonase - 1-2 sprays each nostril 1 time per day  5. Continue Omeprazole 40 one tablet two times per day + Pepcid 20 mg in evening  6. Use Duoneb nebulization or Albuterol HFA (NOT Combivent) two puffs every 4-6 hours if needed.  7. DISCONTINUE Zyrtec / cetirizine  8. Is Flexeril or Spiriva causing your dry mouth???  9. Return to clinic in 12 weeks or earlier if problem

## 2020-08-12 ENCOUNTER — Encounter: Payer: Self-pay | Admitting: Allergy and Immunology

## 2020-08-18 ENCOUNTER — Ambulatory Visit: Payer: Medicare Other | Admitting: Allergy and Immunology

## 2020-08-18 ENCOUNTER — Other Ambulatory Visit: Payer: Self-pay

## 2020-08-18 ENCOUNTER — Other Ambulatory Visit: Payer: Self-pay | Admitting: Nurse Practitioner

## 2020-08-18 DIAGNOSIS — N6489 Other specified disorders of breast: Secondary | ICD-10-CM

## 2020-08-26 ENCOUNTER — Ambulatory Visit
Admission: RE | Admit: 2020-08-26 | Discharge: 2020-08-26 | Disposition: A | Discharge status: Home / Self Care | Payer: Medicare Other | Source: Ambulatory Visit | Attending: Nurse Practitioner | Admitting: Nurse Practitioner

## 2020-08-26 ENCOUNTER — Other Ambulatory Visit: Payer: Self-pay

## 2020-08-26 ENCOUNTER — Other Ambulatory Visit: Payer: Self-pay | Admitting: Nurse Practitioner

## 2020-08-26 DIAGNOSIS — N6489 Other specified disorders of breast: Secondary | ICD-10-CM

## 2020-09-02 MED FILL — HUMIRA PEN 40 MG/0.8ML PNKT: 40 | 28 days supply | Qty: 2 | Fill #1

## 2020-09-02 NOTE — Progress Notes (Deleted)
Office Visit Note  Patient: Kimberly Jenkins             Date of Birth: March 22, 1966           MRN: [id number]             PCP: Tamsen Snider, NP Referring: Tamsen Snider, NP Visit Date: 09/15/2020 Occupation: @GUAROCC @  Subjective:  No chief complaint on file.   History of Present Illness: Kimberly Jenkins is a 55 y.o. female ***   Activities of Daily Living:  Patient reports morning stiffness for *** {minute/hour:19697}.   Patient {ACTIONS;DENIES/REPORTS:21021675::"Denies"} nocturnal pain.  Difficulty dressing/grooming: {ACTIONS;DENIES/REPORTS:21021675::"Denies"} Difficulty climbing stairs: {ACTIONS;DENIES/REPORTS:21021675::"Denies"} Difficulty getting out of chair: {ACTIONS;DENIES/REPORTS:21021675::"Denies"} Difficulty using hands for taps, buttons, cutlery, and/or writing: {ACTIONS;DENIES/REPORTS:21021675::"Denies"}  No Rheumatology ROS completed.   PMFS History:  Patient Active Problem List   Diagnosis Date Noted  . Pain in right knee 08/28/2019  . Fibromyalgia 08/27/2016  . History of right rotator cuff tear repair 08/27/2016  . Diabetes 08/27/2016  . Chronic obstructive pulmonary disease (HCC) 08/27/2016  . Dyslipidemia 08/27/2016  . History of renal cell carcinoma 08/27/2016  . Anxiety 08/27/2016  . Sleep apnea 08/27/2016  . Rheumatoid arthritis of multiple sites with negative rheumatoid factor  07/18/2016  . High risk medication use 07/18/2016  . Hand pain 07/13/2016  . Foot pain 07/13/2016  . Elevated sed rate 07/13/2016  . Severe persistent asthma 04/30/2015  . Other allergic rhinitis 04/30/2015  . GERD (gastroesophageal reflux disease) 04/30/2015    Past Medical History:  Diagnosis Date  . Allergic rhinitis   . Asthma   . Cancer of kidney (H[facility])   . COPD (chronic obstructive pulmonary disease) ([facility])   . Dehydration   . GERD (gastroesophageal reflux disease)   . Hypothyroidism   . Kidney disease   . Rheumatoid arthritis ([facility])   .  Sleep apnea   . Tremor     Family History  Problem Relation Age of Onset  . Brain cancer Brother   . Asthma Mother   . Lung cancer Mother   . Asthma Father   . Allergic rhinitis Father   . COPD Father   . Heart attack Father   . Arthritis Father   . Brain cancer Paternal Uncle   . Asthma Maternal Grandmother   . Brain cancer Paternal Grandmother   . Heart attack Brother    Past Surgical History:  Procedure Laterality Date  . APPENDECTOMY    . CESAREAN SECTION     2 times  . CHOLECYSTECTOMY    . COLONOSCOPY W/ POLYPECTOMY  2021  . ESOPHAGEAL DILATION  2021  . KIDNEY SURGERY    . SHOULDER SURGERY    . TONSILLECTOMY    . TOTAL SHOULDER ARTHROPLASTY    . TUBAL LIGATION     Social History   Social History Narrative  . Not on file   Immunization History  Administered Date(s) Administered  . PFIZER SARS-COV-2 Vaccination 11/18/2019, 12/09/2019, 08/05/2020     Objective: Vital Signs: There were no vitals taken for this visit.   Physical Exam   Musculoskeletal Exam: ***  CDAI Exam: CDAI Score: -- Patient Global: --; Provider Global: -- Swollen: --; Tender: -- Joint Exam 09/15/2020   No joint exam has been documented for this visit   There is currently no information documented on the homunculus. Go to the Rheumatology activity and complete the homunculus joint exam.  Investigation: No additional findings.  Imaging: MM DIAG BREAST TOMO UNI  RIGHT  Result Date: 08/26/2020 CLINICAL DATA:  55 year old female presenting for biopsy of a possible right breast asymmetry without sonographic correlate. EXAM: DIGITAL DIAGNOSTIC UNILATERAL RIGHT MAMMOGRAM WITH TOMO AND CAD COMPARISON:  Previous exam(s). ACR Breast Density Category b: There are scattered areas of fibroglandular density. FINDINGS: On the pre biopsy scout images the possible asymmetry in the upper outer right breast could not be reproduced definitively. Repeat full field mL and spot compression mL  tomosynthesis views were also performed demonstrating decreased conspicuity of the possible asymmetry, most likely representing normal overlapping fibroglandular tissue. Therefore the planned biopsy was canceled. IMPRESSION: Probably benign right breast asymmetry, with decreased conspicuity on today's pre biopsy imaging, likely representing normal overlapping fibroglandular tissue. RECOMMENDATION: Diagnostic right breast mammogram in 6 months. I have discussed the findings and recommendations with the patient who agrees to short-term follow-up. If applicable, a reminder letter will be sent to the patient regarding the next appointment. BI-RADS CATEGORY  3: Probably benign. Electronically Signed   By: Audie Pinto M.D.   On: 08/26/2020 09:18    Recent Labs: Lab Results  Component Value Date   WBC 6.1 06/18/2020   HGB 11.7 06/18/2020   PLT 230 06/18/2020   NA 137 06/18/2020   K 3.8 06/18/2020   CL 106 06/18/2020   CO2 26 06/18/2020   GLUCOSE 186 (H) 06/18/2020   BUN 13 06/18/2020   CREATININE 1.10 (H) 06/18/2020   BILITOT 0.3 06/18/2020   ALKPHOS 160 (H) 08/07/2019   AST 23 06/18/2020   ALT 27 06/18/2020   PROT 6.4 06/18/2020   ALBUMIN 4.2 08/07/2019   CALCIUM 8.7 06/18/2020   GFRAA 65 06/18/2020   QFTBGOLD NEGATIVE 06/26/2017   QFTBGOLDPLUS NEGATIVE 11/11/2019    Speciality Comments: No specialty comments available.  Procedures:  No procedures performed Allergies: Methotrexate, Advair diskus [fluticasone-salmeterol], Aspirin, Ciprofloxacin, Clonidine derivatives, Nystatin, Oxycodone, Serevent [salmeterol], and Cephalexin   Assessment / Plan:     Visit Diagnoses: No diagnosis found.  Orders: No orders of the defined types were placed in this encounter.  No orders of the defined types were placed in this encounter.   Face-to-face time spent with patient was *** minutes. Greater than 50% of time was spent in counseling and coordination of care.  Follow-Up Instructions:  No follow-ups on file.   Earnestine Mealing, CMA  Note - This record has been created using Editor, commissioning.  Chart creation errors have been sought, but may not always  have been located. Such creation errors do not reflect on  the standard of medical care.

## 2020-09-08 ENCOUNTER — Other Ambulatory Visit: Payer: Self-pay

## 2020-09-08 ENCOUNTER — Ambulatory Visit (INDEPENDENT_AMBULATORY_CARE_PROVIDER_SITE_OTHER): Payer: Medicare Other | Admitting: *Deleted

## 2020-09-08 DIAGNOSIS — J4550 Severe persistent asthma, uncomplicated: Secondary | ICD-10-CM | POA: Diagnosis not present

## 2020-09-15 ENCOUNTER — Ambulatory Visit: Payer: Medicare Other | Admitting: Rheumatology

## 2020-09-15 DIAGNOSIS — Z85528 Personal history of other malignant neoplasm of kidney: Secondary | ICD-10-CM

## 2020-09-15 DIAGNOSIS — M0609 Rheumatoid arthritis without rheumatoid factor, multiple sites: Secondary | ICD-10-CM

## 2020-09-15 DIAGNOSIS — E785 Hyperlipidemia, unspecified: Secondary | ICD-10-CM

## 2020-09-15 DIAGNOSIS — M7062 Trochanteric bursitis, left hip: Secondary | ICD-10-CM

## 2020-09-15 DIAGNOSIS — Z79899 Other long term (current) drug therapy: Secondary | ICD-10-CM

## 2020-09-15 DIAGNOSIS — Z8719 Personal history of other diseases of the digestive system: Secondary | ICD-10-CM

## 2020-09-15 DIAGNOSIS — J449 Chronic obstructive pulmonary disease, unspecified: Secondary | ICD-10-CM

## 2020-09-15 DIAGNOSIS — R768 Other specified abnormal immunological findings in serum: Secondary | ICD-10-CM

## 2020-09-15 DIAGNOSIS — F5101 Primary insomnia: Secondary | ICD-10-CM

## 2020-09-15 DIAGNOSIS — Z9889 Other specified postprocedural states: Secondary | ICD-10-CM

## 2020-09-15 DIAGNOSIS — M797 Fibromyalgia: Secondary | ICD-10-CM

## 2020-09-15 DIAGNOSIS — Z8639 Personal history of other endocrine, nutritional and metabolic disease: Secondary | ICD-10-CM

## 2020-10-06 ENCOUNTER — Ambulatory Visit: Payer: Medicare Other

## 2020-10-11 MED FILL — HUMIRA PEN 40 MG/0.8ML PNKT: 40 | 28 days supply | Qty: 2 | Fill #2

## 2020-10-20 ENCOUNTER — Other Ambulatory Visit: Payer: Self-pay | Admitting: *Deleted

## 2020-10-20 MED ORDER — NUCALA 100 MG ~~LOC~~ SOLR: 100.0000 mg | SUBCUTANEOUS | 11 refills | Status: DC

## 2020-11-03 ENCOUNTER — Ambulatory Visit (INDEPENDENT_AMBULATORY_CARE_PROVIDER_SITE_OTHER): Payer: Medicare Other | Admitting: Allergy and Immunology

## 2020-11-03 ENCOUNTER — Encounter: Payer: Self-pay | Admitting: Allergy and Immunology

## 2020-11-03 ENCOUNTER — Other Ambulatory Visit: Payer: Self-pay | Admitting: Allergy and Immunology

## 2020-11-03 ENCOUNTER — Encounter: Payer: Self-pay | Admitting: Physician Assistant

## 2020-11-03 ENCOUNTER — Ambulatory Visit: Payer: Medicare Other

## 2020-11-03 ENCOUNTER — Other Ambulatory Visit: Payer: Self-pay | Admitting: Physician Assistant

## 2020-11-03 ENCOUNTER — Telehealth: Payer: Self-pay

## 2020-11-03 ENCOUNTER — Ambulatory Visit (INDEPENDENT_AMBULATORY_CARE_PROVIDER_SITE_OTHER): Payer: Medicare Other | Admitting: Physician Assistant

## 2020-11-03 ENCOUNTER — Other Ambulatory Visit: Payer: Self-pay

## 2020-11-03 VITALS — BP 115/76 | HR 76 | Ht 61.0 in | Wt 221.0 lb

## 2020-11-03 VITALS — BP 104/68 | HR 82 | Resp 20

## 2020-11-03 DIAGNOSIS — B370 Candidal stomatitis: Secondary | ICD-10-CM

## 2020-11-03 DIAGNOSIS — F5101 Primary insomnia: Secondary | ICD-10-CM

## 2020-11-03 DIAGNOSIS — M7062 Trochanteric bursitis, left hip: Secondary | ICD-10-CM

## 2020-11-03 DIAGNOSIS — Z9889 Other specified postprocedural states: Secondary | ICD-10-CM | POA: Diagnosis not present

## 2020-11-03 DIAGNOSIS — Z780 Asymptomatic menopausal state: Secondary | ICD-10-CM

## 2020-11-03 DIAGNOSIS — Z79899 Other long term (current) drug therapy: Secondary | ICD-10-CM

## 2020-11-03 DIAGNOSIS — J449 Chronic obstructive pulmonary disease, unspecified: Secondary | ICD-10-CM

## 2020-11-03 DIAGNOSIS — M797 Fibromyalgia: Secondary | ICD-10-CM

## 2020-11-03 DIAGNOSIS — K219 Gastro-esophageal reflux disease without esophagitis: Secondary | ICD-10-CM | POA: Diagnosis not present

## 2020-11-03 DIAGNOSIS — J4550 Severe persistent asthma, uncomplicated: Secondary | ICD-10-CM

## 2020-11-03 DIAGNOSIS — M8589 Other specified disorders of bone density and structure, multiple sites: Secondary | ICD-10-CM

## 2020-11-03 DIAGNOSIS — M0609 Rheumatoid arthritis without rheumatoid factor, multiple sites: Secondary | ICD-10-CM

## 2020-11-03 DIAGNOSIS — Z8639 Personal history of other endocrine, nutritional and metabolic disease: Secondary | ICD-10-CM

## 2020-11-03 DIAGNOSIS — Z85528 Personal history of other malignant neoplasm of kidney: Secondary | ICD-10-CM

## 2020-11-03 DIAGNOSIS — Z8719 Personal history of other diseases of the digestive system: Secondary | ICD-10-CM

## 2020-11-03 DIAGNOSIS — E785 Hyperlipidemia, unspecified: Secondary | ICD-10-CM

## 2020-11-03 DIAGNOSIS — J3089 Other allergic rhinitis: Secondary | ICD-10-CM | POA: Diagnosis not present

## 2020-11-03 DIAGNOSIS — M7061 Trochanteric bursitis, right hip: Secondary | ICD-10-CM

## 2020-11-03 DIAGNOSIS — R768 Other specified abnormal immunological findings in serum: Secondary | ICD-10-CM

## 2020-11-03 DIAGNOSIS — R296 Repeated falls: Secondary | ICD-10-CM

## 2020-11-03 DIAGNOSIS — Z111 Encounter for screening for respiratory tuberculosis: Secondary | ICD-10-CM

## 2020-11-03 DIAGNOSIS — R7689 Other specified abnormal immunological findings in serum: Secondary | ICD-10-CM

## 2020-11-03 MED ORDER — FLUCONAZOLE 150 MG PO TABS: ORAL_TABLET | ORAL | 0 refills | Status: DC

## 2020-11-03 MED ORDER — BREO ELLIPTA 200-25 MCG/INH IN AEPB: INHALATION_SPRAY | RESPIRATORY_TRACT | 5 refills | Status: AC

## 2020-11-03 NOTE — Progress Notes (Signed)
[facility] [place] [facility]   Follow-up Note  Referring Provider: Clancy Gourd, NP Primary Provider: Clancy Gourd, NP Date of Office Visit: 11/03/2020  Subjective:   Kimberly Jenkins (DOB: 17-Sep-1965) is a 55 y.o. female who returns to the [facility] on 11/03/2020 in re-evaluation of the following:  HPI: Kimberly Jenkins returns to this clinic in reevaluation of her severe eosinophilic asthma, allergic rhinitis, LPR, and a recent issue involving a fall.  I last saw her in this clinic on 11 August 2020.  She was really doing well with her airway and rarely used a short acting bronchodilator and was walking the mall about 2 miles per day while utilizing a collection of anti-inflammatory agents for her airway and continuing on mepolizumab injections.  Likewise, she was doing very well regarding her nose and her reflux was under very good control.  It sounds as though she has had 2 new developments.  1 week ago she fell and it sounds as though she fractured her left posterior #9 rib confirmed by a chest x-ray.  Several days after that fall she has been coughing.  It should be noted that she is making some phlegm production that can be yellow although she does not feel as though she has any fever or significant shortness of breath or chest tightness or other respiratory tract symptoms.  Interestingly, she did have an irritated throat that was present about 1 week prior to her fall and continues today.  During her last evaluation she was having a very dry mouth and we asked her to work through whether or not it was Flexeril or Spiriva giving rise to the dry mouth and it appeared as though was Flexeril was responsible for that issue and her mouth is doing much better while she remains away from using Flexeril.  Allergies as of 11/03/2020      Reactions   Methotrexate Diarrhea   Advair Diskus [fluticasone-salmeterol]    Aspirin    Ciprofloxacin     Clonidine Derivatives    Nystatin    Oxycodone    Serevent [salmeterol]    Cephalexin Nausea And Vomiting      Medication List      amphetamine-dextroamphetamine 5 MG tablet Commonly known as: ADDERALL Take 1 tablet by mouth daily.   Breo Ellipta 200-25 MCG/INH Aepb Generic drug: fluticasone furoate-vilanterol Inhale one puff once daily   busPIRone 15 MG tablet Commonly known as: BUSPAR Take one tablet three times daily.   cetirizine 10 MG tablet Commonly known as: ZYRTEC Take 10 mg by mouth daily.   clorazepate 3.75 MG tablet Commonly known as: TRANXENE Take one tablet four times a day as needed.   cyclobenzaprine 10 MG tablet Commonly known as: FLEXERIL Take 10 mg by mouth at bedtime.   donepezil 10 MG tablet Commonly known as: ARICEPT Take 10 mg by mouth daily.   EPINEPHrine 0.3 mg/0.3 mL Soaj injection Commonly known as: EpiPen 2-Pak Use as directed for life-threatening allergic reaction.   ezetimibe 10 MG tablet Commonly known as: ZETIA Take 10 mg by mouth daily.   famotidine 20 MG tablet Commonly known as: PEPCID Take 1 tablet by mouth at bedtime.   FISH OIL PO Take by mouth.   FLUoxetine 20 MG capsule Commonly known as: PROZAC Take 60 mg by mouth daily.   fluticasone 50 MCG/ACT nasal spray Commonly known as: FLONASE Place 2 sprays into both nostrils daily.   folic acid  1 MG tablet Commonly known as: FOLVITE TAKE 2 TABLETS BY MOUTH EVERY DAY   furosemide 40 MG tablet Commonly known as: LASIX Take 20 mg by mouth every other day.   gabapentin 600 MG tablet Commonly known as: NEURONTIN Take 600 mg by mouth 3 (three) times daily.   Humira Pen 40 MG/0.8ML Pnkt Generic drug: Adalimumab INJECT 40 MG INTO THE SKIN EVERY 14 DAYS.   HYDROcodone-acetaminophen 10-325 MG tablet Commonly known as: NORCO Take by mouth.   insulin glargine 100 UNIT/ML Solostar Pen Commonly known as: LANTUS as needed.   Insulin Pen Needle 31G X 8 MM Misc    Ipratropium-Albuterol 20-100 MCG/ACT Aers respimat Commonly known as: COMBIVENT Inhale 1 puff into the lungs every 6 (six) hours as needed for wheezing.   ipratropium-albuterol 0.5-2.5 (3) MG/3ML Soln Commonly known as: DUONEB CAN USE ONE VIAL VIA NEBULIZER EVERY 4-6 HOURS AS NEEDED FOR COUGH OR WHEEZE   lamoTRIgine 25 MG tablet Commonly known as: LAMICTAL Take 25 mg by mouth 2 (two) times daily.   leflunomide 10 MG tablet Commonly known as: ARAVA Take 2 tablets (20 mg total) by mouth daily.   levothyroxine 50 MCG tablet Commonly known as: SYNTHROID TAKE 1 TABLET BY MOUTH EVERY DAY AT 6 AM   Linzess 72 MCG capsule Generic drug: linaclotide Take 72 mcg by mouth daily.   metFORMIN 500 MG tablet Commonly known as: GLUCOPHAGE Take 500 mg by mouth 2 (two) times daily with a meal.   montelukast 10 MG tablet Commonly known as: SINGULAIR Take 1 tablet (10 mg total) by mouth daily.   Nucala 100 MG Solr Generic drug: Mepolizumab Inject 100 mg into the skin every 28 (twenty-eight) days.   nystatin cream Commonly known as: MYCOSTATIN as needed.   olopatadine 0.1 % ophthalmic solution Commonly known as: PATANOL   omeprazole 40 MG capsule Commonly known as: PRILOSEC Take 40 mg by mouth 2 (two) times daily.   potassium chloride SA 20 MEQ tablet Commonly known as: KLOR-CON Take by mouth.   pramipexole 0.125 MG tablet Commonly known as: MIRAPEX Take by mouth.   pravastatin 40 MG tablet Commonly known as: PRAVACHOL Take 40 mg by mouth daily.   progesterone 100 MG capsule Commonly known as: PROMETRIUM Take 200 mg by mouth daily.   Rexulti 2 MG Tabs tablet Generic drug: brexpiprazole Take 2 mg by mouth daily.   rizatriptan 10 MG tablet Commonly known as: MAXALT Take 10 mg by mouth as needed.   Spiriva Respimat 1.25 MCG/ACT Aers Generic drug: Tiotropium Bromide Monohydrate Inhale two puffs once daily   topiramate 25 MG tablet Commonly known as: TOPAMAX Take  75 mg by mouth daily.   triamcinolone 0.1 % Commonly known as: KENALOG as needed.   VICTOZA [place] Inject into the skin.   Vitamin D (Ergocalciferol) 1.25 MG (50000 UNIT) Caps capsule Commonly known as: DRISDOL Take by mouth every 7 (seven) days.       Past Medical History:  Diagnosis Date  . Allergic rhinitis   . Asthma   . Cancer of kidney ([facility])   . COPD (chronic obstructive pulmonary disease) ([place])   . Dehydration   . GERD (gastroesophageal reflux disease)   . Hypothyroidism   . Kidney disease   . Rheumatoid arthritis ([place])   . Sleep apnea   . Tremor     Past Surgical History:  Procedure Laterality Date  . APPENDECTOMY    . CESAREAN SECTION     2 times  . CHOLECYSTECTOMY    .  COLONOSCOPY W/ POLYPECTOMY  2021  . ESOPHAGEAL DILATION  2021  . KIDNEY SURGERY    . SHOULDER SURGERY    . TONSILLECTOMY    . TOTAL SHOULDER ARTHROPLASTY    . TUBAL LIGATION      Review of systems negative except as noted in HPI / PMHx or noted below:  Review of Systems  Constitutional: Negative.   HENT: Negative.   Eyes: Negative.   Respiratory: Negative.   Cardiovascular: Negative.   Gastrointestinal: Negative.   Genitourinary: Negative.   Musculoskeletal: Negative.   Skin: Negative.   Neurological: Negative.   Endo/Heme/Allergies: Negative.   Psychiatric/Behavioral: Negative.      Objective:   Vitals:   11/03/20 0822  BP: 104/68  Pulse: 82  Resp: 20  SpO2: 97%          Physical Exam Constitutional:      Appearance: She is not diaphoretic.  HENT:     Head: Normocephalic.     Right Ear: Tympanic membrane, ear canal and external ear normal.     Left Ear: Tympanic membrane, ear canal and external ear normal.     Nose: Nose normal. No mucosal edema or rhinorrhea.     Mouth/Throat:     Mouth: Mucous membranes are normal.     Pharynx: Uvula midline. Oropharyngeal exudate (Thrush) present.  Eyes:     Conjunctiva/sclera: Conjunctivae normal.  Neck:     Thyroid:  No thyromegaly.     Trachea: Trachea normal. No tracheal tenderness or tracheal deviation.  Cardiovascular:     Rate and Rhythm: Normal rate and regular rhythm.     Heart sounds: Normal heart sounds, S1 normal and S2 normal. No murmur heard.   Pulmonary:     Effort: No respiratory distress.     Breath sounds: Normal breath sounds. No stridor. No wheezing or rales.  Musculoskeletal:        General: No edema.  Lymphadenopathy:     Head:     Right side of head: No tonsillar adenopathy.     Left side of head: No tonsillar adenopathy.     Cervical: No cervical adenopathy.  Skin:    Findings: No erythema or rash.     Nails: There is no clubbing.  Neurological:     Mental Status: She is alert.     Diagnostics:    Spirometry was not performed.   Assessment and Plan:   1. Asthma, severe persistent, well-controlled   2. Other allergic rhinitis   3. LPRD (laryngopharyngeal reflux disease)   4. Thrush     1. Continue Breo 200 - 1 inhalation +  Spiriva 1.25 Respimat - 2 inhalations daily   2. Continue Mepolizumab injections every 4 weeks   3. Continue Montelukast - 10 one tablet one time per day  4. Continue Flonase - 1-2 sprays each nostril 1 time per day  5. Continue Omeprazole 40 one tablet two times per day + Pepcid 20 mg in evening  6. Use Duoneb nebulization or Albuterol HFA 2 puffs every 4-6 hours and Zyrtec if needed.  7. Treat fungus with diflucan 150 mg tablet today and repeat in one week    8. Further treatment???  9. Return to clinic in 12 weeks or earlier if problem  Kimberly Jenkins appears to have thrush and I suspect that this is the material that she has been coughing up recently.  Of course, with her recent fall it is quite possible she has a secondary infection of her lung  parenchyma but she just looks very healthy today and at this point were not going to exposure her to any more radiation with a CXR or treat her empirically for a bacterial infection.  She will keep  in contact with me noting her response to treating her fungal overgrowth issue.  She will remain on large collection of anti-inflammatory agents for airway and therapy directed against reflux as noted above.  I will see her back in this clinic in 12 weeks or earlier if there is a problem.  Kimberly Katz, MD Allergy / [facility] [place]

## 2020-11-03 NOTE — Telephone Encounter (Signed)
Patient advised.

## 2020-11-03 NOTE — Telephone Encounter (Signed)
Pharmacy sent message saying there was interaction between patient's primidone and Diflucan.  I informed patient that Dr. Neldon Mc would like her to just use half the usual dose of Primidone for three days after taking a Diflucan tablet.  In other words she will use half dose of Primidone for three days after taking the first Diflucan tablet and then repeat the same process when she takes her second Diflucan tablet.  Patient ok with plan.

## 2020-11-03 NOTE — Progress Notes (Signed)
Office Visit Note  Patient: Kimberly Jenkins             Date of Birth: 1966-07-13           MRN: [id number]             PCP: Clancy Gourd, NP Referring: Clancy Gourd, NP Visit Date: 11/03/2020 Occupation: @GUAROCC @  Subjective:  Frequent falls   History of Present Illness: Kimberly Jenkins is a 55 y.o. female with history of seronegative rheumatoid arthritis and osteoarthritis.  Patient is on Humira 40 mg sq injections every 14 days and arava 20 mg 1 tablet by mouth daily.  She denies any recent rheumatoid arthritis flares.  She has found this combination of medications to be effective at managing her RA.  She has occasional hand cramping but denies any joint pain or joint swelling.  She reports she continues to have frequent falls.  She states her most recent fall was one week ago and she fractured her ninth rib on the left side according to x-rays obtained by her PCP per patient.  She has continued to take vitamin D 50,000 units once weekly and occasionally takes a calcium supplement.  She denies any other recent fractures. Patient reports that she was evaluated by her pulmonologist for a routine exam yesterday and she was diagnosed with oral thrush.  According to the patient her pulmonologist sent in a prescription for 1 tablet for today and then 1 tablet which she will take next week but she is unsure of the name of the antifungal treatment.  She plans on picking up the prescription today.    Activities of Daily Living:  Patient reports morning stiffness for 5-10 minutes.   Patient Reports nocturnal pain.  Difficulty dressing/grooming: Denies Difficulty climbing stairs: Reports Difficulty getting out of chair: Reports Difficulty using hands for taps, buttons, cutlery, and/or writing: Reports  Review of Systems  Constitutional: Positive for fatigue.  HENT: Positive for mouth dryness. Negative for mouth sores and nose dryness.   Eyes: Negative for pain, itching,  visual disturbance and dryness.  Respiratory: Positive for cough. Negative for hemoptysis, shortness of breath and difficulty breathing.   Cardiovascular: Positive for swelling in legs/feet. Negative for chest pain and palpitations.  Gastrointestinal: Positive for constipation and diarrhea. Negative for abdominal pain and blood in stool.  Endocrine: Negative for increased urination.  Genitourinary: Negative for painful urination.  Musculoskeletal: Positive for arthralgias, joint pain, myalgias, muscle weakness, morning stiffness, muscle tenderness and myalgias. Negative for joint swelling.  Skin: Negative for color change, rash and redness.  Allergic/Immunologic: Positive for susceptible to infections.  Neurological: Positive for memory loss. Negative for dizziness, numbness, headaches and weakness.  Hematological: Negative for swollen glands.  Psychiatric/Behavioral: Positive for sleep disturbance. Negative for confusion.    PMFS History:  Patient Active Problem List   Diagnosis Date Noted   Pain in right knee 08/28/2019   Fibromyalgia 08/27/2016   History of right rotator cuff tear repair 08/27/2016   Diabetes 08/27/2016   Chronic obstructive pulmonary disease ([place]) 08/27/2016   Dyslipidemia 08/27/2016   History of renal cell carcinoma 08/27/2016   Anxiety 08/27/2016   Sleep apnea 08/27/2016   Rheumatoid arthritis of multiple sites with negative rheumatoid factor  07/18/2016   High risk medication use 07/18/2016   Hand pain 07/13/2016   Foot pain 07/13/2016   Elevated sed rate 07/13/2016   Severe persistent asthma 04/30/2015   Other allergic rhinitis 04/30/2015   GERD (gastroesophageal reflux  disease) 04/30/2015    Past Medical History:  Diagnosis Date   Allergic rhinitis    Asthma    Cancer of kidney ([facility])    COPD (chronic obstructive pulmonary disease) ([facility])    Dehydration    GERD (gastroesophageal reflux disease)    Hypothyroidism    Kidney  disease    Rheumatoid arthritis ([place])    Sleep apnea    Tremor     Family History  Problem Relation Age of Onset   Brain cancer Brother    Asthma Mother    Lung cancer Mother    Asthma Father    Allergic rhinitis Father    COPD Father    Heart attack Father    Arthritis Father    Brain cancer Paternal Uncle    Asthma Maternal Grandmother    Brain cancer Paternal Grandmother    Heart attack Brother    Past Surgical History:  Procedure Laterality Date   APPENDECTOMY     CESAREAN SECTION     2 times   CHOLECYSTECTOMY     COLONOSCOPY W/ POLYPECTOMY  2021   ESOPHAGEAL DILATION  2021   KIDNEY SURGERY     SHOULDER SURGERY     TONSILLECTOMY     TOTAL SHOULDER ARTHROPLASTY     TUBAL LIGATION     Social History   Social History Narrative   Not on file   Immunization History  Administered Date(s) Administered   PFIZER(Purple Top)SARS-COV-2 Vaccination 11/18/2019, 12/09/2019, 08/05/2020     Objective: Vital Signs: BP 115/76 (BP Location: Left Arm, Patient Position: Sitting, Cuff Size: Normal)    Pulse 76    Ht 5\' 1"  (1.549 m)    Wt 221 lb (100.2 kg)    BMI 41.76 kg/m    Physical Exam Vitals and nursing note reviewed.  Constitutional:      Appearance: She is well-developed and well-nourished.  HENT:     Head: Normocephalic and atraumatic.  Eyes:     Extraocular Movements: EOM normal.     Conjunctiva/sclera: Conjunctivae normal.  Cardiovascular:     Pulses: Intact distal pulses.  Pulmonary:     Effort: Pulmonary effort is normal.  Abdominal:     Palpations: Abdomen is soft.  Musculoskeletal:     Cervical back: Normal range of motion.  Skin:    General: Skin is warm and dry.     Capillary Refill: Capillary refill takes less than 2 seconds.  Neurological:     Mental Status: She is alert and oriented to person, place, and time.  Psychiatric:        Mood and Affect: Mood and affect normal.        Behavior: Behavior normal.       Musculoskeletal Exam: C-spine, thoracic spine, and lumbar spine good ROM.  Shoulder joints, elbow joints, wrist joints, MCPs, PIPs, and DIPs good ROM with no synovitis.  PIP and DIP thickening consistent with osteoarthritis of both hands.  Hip joints, knee joints, and ankle joints good ROM with no discomfort. No warmth or effusion of knee joints.  Thickening over the left ankle joint.  No tenderness or swelling of ankle joints.  No tenderness over the trochanteric bursa at this time.  CDAI Exam: CDAI Score: 0.6  Patient Global: 4 mm; Provider Global: 2 mm Swollen: 0 ; Tender: 0  Joint Exam 11/03/2020   No joint exam has been documented for this visit   There is currently no information documented on the homunculus. Go to the Rheumatology  activity and complete the homunculus joint exam.  Investigation: No additional findings.  Imaging: No results found.  Recent Labs: Lab Results  Component Value Date   WBC 6.1 06/18/2020   HGB 11.7 06/18/2020   PLT 230 06/18/2020   NA 137 06/18/2020   K 3.8 06/18/2020   CL 106 06/18/2020   CO2 26 06/18/2020   GLUCOSE 186 (H) 06/18/2020   BUN 13 06/18/2020   CREATININE 1.10 (H) 06/18/2020   BILITOT 0.3 06/18/2020   ALKPHOS 160 (H) 08/07/2019   AST 23 06/18/2020   ALT 27 06/18/2020   PROT 6.4 06/18/2020   ALBUMIN 4.2 08/07/2019   CALCIUM 8.7 06/18/2020   GFRAA 65 06/18/2020   QFTBGOLD NEGATIVE 06/26/2017   QFTBGOLDPLUS NEGATIVE 11/11/2019    Speciality Comments: No specialty comments available.  Procedures:  No procedures performed Allergies: Methotrexate, Advair diskus [fluticasone-salmeterol], Aspirin, Ciprofloxacin, Clonidine derivatives, Nystatin, Oxycodone, Serevent [salmeterol], and Cephalexin   Assessment / Plan:     Visit Diagnoses: Rheumatoid arthritis of multiple sites with negative rheumatoid factor: She has no joint tenderness or synovitis on exam.  She has not had any recent rheumatoid arthritis flares.  She is  clinically doing well on Humira 40 mg subcutaneous injections every 14 days and Arava 20 mg 1 tablet by mouth daily.  Her rheumatoid arthritis has been well controlled on combination therapy.  She has not had any difficulty with ADLs.  She will continue on the current treatment regimen.  She was advised to notify us if she develops recurrent flares.  She will follow-up in the office in 5 months.  High risk medication use - Humira 40 mg subcutaneous injections every 14 days (11/02/2016), Arava 20 mg by mouth daily (12/13/2016) (inadequate response to Enbrel, side effects from methotrexate).  CBC and CMP updated on 06/18/2020.  She is overdue to update lab work.  Orders for CBC and CMP were released.  Her next lab work will be due in June and every 3 months to monitor for drug toxicity.  TB gold negative on 11/11/2019.  Order for TB gold was released.  - Plan: QuantiFERON-TB Gold Plus, CBC with Differential/Platelet, COMPLETE METABOLIC PANEL WITH GFR She will be starting fluconazole today for management of oral thrush.  She was advised to hold humira and arava until the infection has completely cleared. We discussed the importance of holding Humira and Arava if she develops signs or symptoms of an infection and to resume once infection has completely cleared.  She voiced understanding.  Screening for tuberculosis -Order for TB gold released today.  Plan: QuantiFERON-TB Gold Plus  Fibromyalgia: She continues to have intermittent myalgias and muscle tenderness due to fibromyalgia.  She takes Flexeril 10 mg 1 tablet by mouth at bedtime as needed for muscle spasms and gabapentin as prescribed.  She has started to walk on a regular basis for exercise.  We discussed the importance of regular exercise and good sleep hygiene.  History of rotator cuff surgery: Right shoulder-She experiences intermittent discomfort in her right shoulder.  She has good range of motion on examination today with no  discomfort.  Trochanteric bursitis, left hip: She has occasional symptoms of trochanteric bursitis of the left hip.  Trochanteric bursitis, right hip: She experiences intermittent discomfort due to trochanter bursitis of the right hip.  She has no tenderness palpation on examination today.  Primary insomnia: She experiences occasional nocturnal pain which contributes to her insomnia.  We discussed the importance of good sleep hygiene.  Positive ANA (antinuclear  antibody): She has no clinical features of systemic lupus.  Frequent falls: She continues to have recurrent falls.  Her most recent fall was 1 week ago in her home.  She landed on a space heater and fractured the ninth rib on the left side.  According to the patient she was evaluated at her PCPs office today and had x-rays at that time which revealed a fracture.  We discussed the importance of lower extremity muscle strengthening and fall prevention.  She has started walking on a regular basis with her walker for exercise to try to build up her strength.  She was advised to schedule appointment with her neurologist for further evaluation.  Osteopenia of multiple sites: DEXA updated on 12/12/2019: AP spine BMD 0.842 with T score -1.9.  DEXA results were reviewed with the patient today in the office.  She continues to have frequent falls.  We discussed the importance of lower extremity muscle strengthening and fall prevention.  She was advised to schedule an appointment with her neurologist for further evaluation.  She has been taking vitamin D 5000 units once weekly.  We discussed the importance of increasing her calcium intake to try to obtain 1200 mg daily.  We also discussed the importance of resistive exercises.  Other medical conditions are listed as follows:  Dyslipidemia  History of diabetes mellitus  Chronic obstructive pulmonary disease, unspecified COPD type ([place])  History of hypothyroidism  History of renal cell  carcinoma  History of gastroesophageal reflux (GERD)  Postmenopausal   Orders: Orders Placed This Encounter  Procedures   QuantiFERON-TB Gold Plus   CBC with Differential/Platelet   COMPLETE METABOLIC PANEL WITH GFR   No orders of the defined types were placed in this encounter.     Follow-Up Instructions: Return in about 5 months (around 04/05/2021) for Rheumatoid arthritis, Osteoarthritis.   Ofilia Neas, PA-C  Note - This record has been created using Dragon software.  Chart creation errors have been sought, but may not always  have been located. Such creation errors do not reflect on  the standard of medical care.

## 2020-11-03 NOTE — Patient Instructions (Signed)
Standing Labs We placed an order today for your standing lab work.   Please have your standing labs drawn in June and every 3 months   If possible, please have your labs drawn 2 weeks prior to your appointment so that the provider can discuss your results at your appointment.  We have open lab daily Monday through Thursday from 1:30-4:30 PM and Friday from 1:30-4:00 PM at the office of Dr. Shaili Deveshwar, [facility].   Please be advised, all patients with office appointments requiring lab work will take precedents over walk-in lab work.  If possible, please come for your lab work on Monday and Friday afternoons, as you may experience shorter wait times. The office is located at [address], [address], [place], [place] [address] No appointment is necessary.   Labs are drawn by [facility]. Please bring your co-pay at the time of your lab draw.  You may receive a bill from [facility] for your lab work.  If you wish to have your labs drawn at another location, please call the office 24 hours in advance to send orders.  If you have any questions regarding directions or hours of operation,  please call [phone].   As a reminder, please drink plenty of water prior to coming for your lab work. Thanks!   

## 2020-11-03 NOTE — Telephone Encounter (Signed)
She can use half the usual dose of her primidone for the initial 3 days of taking Diflucan.

## 2020-11-03 NOTE — Telephone Encounter (Signed)
Dr. Neldon Mc, Please advise of refill.  Pharmacy concerned with affecting primodone levels.

## 2020-11-03 NOTE — Telephone Encounter (Signed)
Called pharmacy and checked on RX.

## 2020-11-03 NOTE — Patient Instructions (Signed)
  1. Continue Breo 200 - 1 inhalation +  Spiriva 1.25 Respimat - 2 inhalations daily   2. Continue Mepolizumab injections every 4 weeks   3. Continue Montelukast - 10 one tablet one time per day  4. Continue Flonase - 1-2 sprays each nostril 1 time per day  5. Continue Omeprazole 40 one tablet two times per day + Pepcid 20 mg in evening  6. Use Duoneb nebulization or Albuterol HFA 2 puffs every 4-6 hours and Zyrtec if needed.  7. Treat fungus with diflucan 150 mg tablet today and repeat in one week.  8. Further treatment???  9. Return to clinic in 12 weeks or earlier if problem

## 2020-11-04 ENCOUNTER — Encounter: Payer: Self-pay | Admitting: Allergy and Immunology

## 2020-11-04 NOTE — Progress Notes (Signed)
Glucose is elevated-330.  Rest of CMP WNL.  CBC WNL.

## 2020-11-05 LAB — CBC WITH DIFFERENTIAL/PLATELET
Absolute Monocytes: 848 cells/uL (ref 200–950)
Basophils Absolute: 59 cells/uL (ref 0–200)
Basophils Relative: 0.7 %
Eosinophils Absolute: 109 cells/uL (ref 15–500)
Eosinophils Relative: 1.3 %
HCT: 39.8 % (ref 35.0–45.0)
Hemoglobin: 12.7 g/dL (ref 11.7–15.5)
Lymphs Abs: 2596 cells/uL (ref 850–3900)
MCH: 27.8 pg (ref 27.0–33.0)
MCHC: 31.9 g/dL — ABNORMAL LOW (ref 32.0–36.0)
MCV: 87.1 fL (ref 80.0–100.0)
MPV: 10.8 fL (ref 7.5–12.5)
Monocytes Relative: 10.1 %
Neutro Abs: 4788 cells/uL (ref 1500–7800)
Neutrophils Relative %: 57 %
Platelets: 219 10*3/uL (ref 140–400)
RBC: 4.57 10*6/uL (ref 3.80–5.10)
RDW: 14 % (ref 11.0–15.0)
Total Lymphocyte: 30.9 %
WBC: 8.4 10*3/uL (ref 3.8–10.8)

## 2020-11-05 LAB — QUANTIFERON-TB GOLD PLUS
Mitogen-NIL: >10 [IU]/mL
NIL: 0.05 [IU]/mL
QuantiFERON-TB Gold Plus: NEGATIVE
TB1-NIL: <0 [IU]/mL
TB2-NIL: <0 [IU]/mL

## 2020-11-05 LAB — COMPLETE METABOLIC PANEL WITH GFR
AG Ratio: 1.4 (calc) (ref 1.0–2.5)
ALT: 14 U/L (ref 6–29)
AST: 13 U/L (ref 10–35)
Albumin: 3.8 g/dL (ref 3.6–5.1)
Alkaline phosphatase (APISO): 116 U/L (ref 37–153)
BUN: 10 mg/dL (ref 7–25)
CO2: 25 mmol/L (ref 20–32)
Calcium: 8.9 mg/dL (ref 8.6–10.4)
Chloride: 100 mmol/L (ref 98–110)
Creat: 0.94 mg/dL (ref 0.50–1.05)
GFR, Est African American: 79 mL/min/{1.73_m2} (ref >=60)
GFR, Est Non African American: 68 mL/min/{1.73_m2} (ref >=60)
Globulin: 2.8 g/dL (calc) (ref 1.9–3.7)
Glucose, Bld: 330 mg/dL — ABNORMAL HIGH (ref 65–99)
Potassium: 4.3 mmol/L (ref 3.5–5.3)
Sodium: 134 mmol/L — ABNORMAL LOW (ref 135–146)
Total Bilirubin: 0.3 mg/dL (ref 0.2–1.2)
Total Protein: 6.6 g/dL (ref 6.1–8.1)

## 2020-11-08 NOTE — Progress Notes (Signed)
TB gold negative

## 2020-11-18 ENCOUNTER — Other Ambulatory Visit (HOSPITAL_COMMUNITY): Payer: Self-pay

## 2020-11-30 ENCOUNTER — Ambulatory Visit (INDEPENDENT_AMBULATORY_CARE_PROVIDER_SITE_OTHER): Payer: Medicare Other

## 2020-11-30 DIAGNOSIS — J4550 Severe persistent asthma, uncomplicated: Secondary | ICD-10-CM | POA: Diagnosis not present

## 2020-12-01 ENCOUNTER — Ambulatory Visit: Payer: Self-pay

## 2020-12-09 ENCOUNTER — Other Ambulatory Visit: Payer: Self-pay | Admitting: Rheumatology

## 2020-12-09 ENCOUNTER — Other Ambulatory Visit (HOSPITAL_COMMUNITY): Payer: Self-pay

## 2020-12-09 ENCOUNTER — Other Ambulatory Visit: Payer: Self-pay | Admitting: *Deleted

## 2020-12-09 MED ORDER — LEFLUNOMIDE 10 MG PO TABS: 20.0000 mg | ORAL_TABLET | Freq: Every day | ORAL | 0 refills | Status: DC

## 2020-12-09 MED ORDER — ADALIMUMAB 40 MG/0.8ML ~~LOC~~ AJKT
AUTO-INJECTOR | SUBCUTANEOUS | 0 refills | Status: DC
  Filled 2020-12-09: qty 2, 28d supply, fill #0
  Filled 2021-01-04: qty 2, 28d supply, fill #1
  Filled 2021-02-14 – 2021-02-24 (×2): qty 2, 28d supply, fill #2

## 2020-12-09 NOTE — Telephone Encounter (Signed)
Please call patient to schedule appt. Thank you.  Return in about 5 months (around 04/05/2021) for Rheumatoid arthritis, Osteoarthritis.

## 2020-12-09 NOTE — Telephone Encounter (Addendum)
Next Visit: message sent to front desk to schedule appt, Return in about 5 months (around 04/05/2021) for Rheumatoid arthritis, Osteoarthritis.  Last Visit: 11/03/2020  Last Fill: Humira 07/12/2020, Jolee Ewing 06/26/2017  DX:  Rheumatoid arthritis of multiple sites with negative rheumatoid factor  Current Dose per office note 11/03/2020, Humira 40 mg subcutaneous injections every 14 days, Arava 20 mg by mouth daily   Labs: 11/03/2020, Glucose is elevated-330. Rest of CMP WNL. CBC WNL.   TB Gold: 11/03/2020, TB gold negative.   Okay to refill Arava and Humira?

## 2020-12-10 ENCOUNTER — Other Ambulatory Visit (HOSPITAL_COMMUNITY): Payer: Self-pay

## 2020-12-28 ENCOUNTER — Ambulatory Visit: Payer: Self-pay

## 2021-01-04 ENCOUNTER — Other Ambulatory Visit (HOSPITAL_COMMUNITY): Payer: Self-pay

## 2021-01-10 ENCOUNTER — Ambulatory Visit (INDEPENDENT_AMBULATORY_CARE_PROVIDER_SITE_OTHER): Payer: Medicare Other | Admitting: *Deleted

## 2021-01-10 DIAGNOSIS — J4550 Severe persistent asthma, uncomplicated: Secondary | ICD-10-CM

## 2021-01-11 ENCOUNTER — Other Ambulatory Visit (HOSPITAL_COMMUNITY): Payer: Self-pay

## 2021-01-26 ENCOUNTER — Encounter: Payer: Self-pay | Admitting: Allergy and Immunology

## 2021-01-26 ENCOUNTER — Ambulatory Visit (INDEPENDENT_AMBULATORY_CARE_PROVIDER_SITE_OTHER): Payer: Medicare Other | Admitting: Allergy and Immunology

## 2021-01-26 ENCOUNTER — Other Ambulatory Visit (HOSPITAL_COMMUNITY): Payer: Self-pay

## 2021-01-26 ENCOUNTER — Other Ambulatory Visit: Payer: Self-pay

## 2021-01-26 VITALS — BP 108/62 | HR 92 | Resp 16 | Ht 61.0 in | Wt 217.6 lb

## 2021-01-26 DIAGNOSIS — B370 Candidal stomatitis: Secondary | ICD-10-CM

## 2021-01-26 DIAGNOSIS — J3089 Other allergic rhinitis: Secondary | ICD-10-CM | POA: Diagnosis not present

## 2021-01-26 DIAGNOSIS — K219 Gastro-esophageal reflux disease without esophagitis: Secondary | ICD-10-CM

## 2021-01-26 DIAGNOSIS — D849 Immunodeficiency, unspecified: Secondary | ICD-10-CM

## 2021-01-26 DIAGNOSIS — J4550 Severe persistent asthma, uncomplicated: Secondary | ICD-10-CM

## 2021-01-26 MED ORDER — NYSTATIN 100000 UNIT/ML MT SUSP: OROMUCOSAL | 3 refills | Status: DC

## 2021-01-26 NOTE — Patient Instructions (Signed)
  1. Continue Breo 200 - 1 inhalation +  Spiriva 1.25 Respimat - 2 inhalations daily   2. Continue Mepolizumab injections every 4 weeks   3. Continue Montelukast 10 mg - 1 tablet one time per day  4. Continue Flonase - 1-2 sprays each nostril 1 time per day  5. Continue Omeprazole 40 mg - 1 tablet two times per day + Pepcid 20 mg in evening  6. Use Duoneb nebulization or Albuterol HFA 2 puffs every 4-6 hours and Zyrtec if needed.  7. START Nystatin oral solution - 5 mls swish and spit after BREO / Spiriva use  8. Return to clinic in 12 weeks or earlier if problem

## 2021-01-26 NOTE — Progress Notes (Signed)
[place]   Follow-up Note  Referring Provider: Clancy Gourd, NP Primary Provider: Darrol Jump, NP Date of Office Visit: 01/26/2021  Subjective:   Kimberly Jenkins (DOB: 1966-07-04) is a 54 y.o. female who returns to the [facility] on 01/26/2021 in re-evaluation of the following:  HPI: `Kimberly Jenkins returns to this clinic in evaluation of her severe eosinophilic asthma, allergic rhinitis, LPR.  Her last visit to this clinic was 03 November 2020.  Overall her asthma has been under very good control on her current plan which includes mepolizumab injections and anti-inflammatory agents for her airway.  Her requirement for short acting bronchodilator is daily whenever she exerts herself but otherwise does not use this medication in a rescue mode.  She has not required a systemic steroid to treat an exacerbation of of her asthma since her last visit.  Her nose has been doing quite well at this point in time.  She has not required an antibiotic to treat an episode of sinusitis.  Her reflux and throat is doing quite well while using therapy directed against GERD.  She uses omeprazole twice a day and H2 receptor blocker in the evening.  She has been having problems with cracks alongside of her lips which has been a longstanding issue.  She has also had a history of intermittent thrush that has required the use of Diflucan in the past.  She informs me that she had a colonoscopy performed in investigation of diarrhea which identified ulcerative colitis and she is now on a medication which has resulted in resolution of her diarrhea.  She continues on immunosuppression for her rheumatoid arthritis with the use of adalimumab.  Allergies as of 01/26/2021      Reactions   Methotrexate Diarrhea   Advair Diskus [fluticasone-salmeterol]    Aspirin    Ciprofloxacin    Clonidine Derivatives    Nystatin    Oxycodone    Serevent  [salmeterol]    Cephalexin Nausea And Vomiting      Medication List      amphetamine-dextroamphetamine 5 MG tablet Commonly known as: ADDERALL Take 1 tablet by mouth daily.   Breo Ellipta 200-25 MCG/INH Aepb Generic drug: fluticasone furoate-vilanterol Inhale one puff once daily   busPIRone 15 MG tablet Commonly known as: BUSPAR Take one tablet three times daily.   cetirizine 10 MG tablet Commonly known as: ZYRTEC Take 10 mg by mouth daily.   clorazepate 3.75 MG tablet Commonly known as: TRANXENE Take one tablet four times a day as needed.   cyclobenzaprine 10 MG tablet Commonly known as: FLEXERIL Take 10 mg by mouth at bedtime.   donepezil 10 MG tablet Commonly known as: ARICEPT Take 10 mg by mouth daily.   EPINEPHrine 0.3 mg/0.3 mL Soaj injection Commonly known as: EpiPen 2-Pak Use as directed for life-threatening allergic reaction.   ezetimibe 10 MG tablet Commonly known as: ZETIA Take 10 mg by mouth daily.   famotidine 20 MG tablet Commonly known as: PEPCID Take 1 tablet by mouth at bedtime.   FISH OIL PO Take by mouth.   FLUoxetine 20 MG capsule Commonly known as: PROZAC Take 60 mg by mouth daily.   fluticasone 50 MCG/ACT nasal spray Commonly known as: FLONASE Place 2 sprays into both nostrils daily.   folic acid 1 MG tablet Commonly known as: FOLVITE TAKE 2 TABLETS BY MOUTH EVERY DAY   furosemide 40 MG tablet Commonly known as: LASIX  Take 20 mg by mouth every other day.   gabapentin 600 MG tablet Commonly known as: NEURONTIN Take 600 mg by mouth 3 (three) times daily.   Humira Pen 40 MG/0.8ML Pnkt Generic drug: Adalimumab INJECT 40 MG INTO THE SKIN EVERY 14 DAYS.   HYDROcodone-acetaminophen 10-325 MG tablet Commonly known as: NORCO Take by mouth.   insulin glargine 100 UNIT/ML Solostar Pen Commonly known as: LANTUS as needed.   Insulin Pen Needle 31G X 8 MM Misc   Ipratropium-Albuterol 20-100 MCG/ACT Aers respimat Commonly  known as: COMBIVENT Inhale 1 puff into the lungs every 6 (six) hours as needed for wheezing.   ipratropium-albuterol 0.5-2.5 (3) MG/3ML Soln Commonly known as: DUONEB CAN USE ONE VIAL VIA NEBULIZER EVERY 4-6 HOURS AS NEEDED FOR COUGH OR WHEEZE   lamoTRIgine 25 MG tablet Commonly known as: LAMICTAL Take 25 mg by mouth 2 (two) times daily.   leflunomide 10 MG tablet Commonly known as: ARAVA Take 2 tablets (20 mg total) by mouth daily.   levothyroxine 50 MCG tablet Commonly known as: SYNTHROID TAKE 1 TABLET BY MOUTH EVERY DAY AT 6 AM   Linzess 72 MCG capsule Generic drug: linaclotide Take 72 mcg by mouth daily.   metFORMIN 500 MG tablet Commonly known as: GLUCOPHAGE Take 500 mg by mouth 2 (two) times daily with a meal.   montelukast 10 MG tablet Commonly known as: SINGULAIR Take 1 tablet (10 mg total) by mouth daily.   Nucala 100 MG Solr Generic drug: Mepolizumab Inject 100 mg into the skin every 28 (twenty-eight) days.   nystatin cream Commonly known as: MYCOSTATIN as needed.   olopatadine 0.1 % ophthalmic solution Commonly known as: PATANOL   omeprazole 40 MG capsule Commonly known as: PRILOSEC Take 40 mg by mouth 2 (two) times daily.   potassium chloride SA 20 MEQ tablet Commonly known as: KLOR-CON Take by mouth.   pramipexole 0.125 MG tablet Commonly known as: MIRAPEX Take by mouth.   pravastatin 40 MG tablet Commonly known as: PRAVACHOL Take 40 mg by mouth daily.   progesterone 100 MG capsule Commonly known as: PROMETRIUM Take 200 mg by mouth daily.   Rexulti 2 MG Tabs tablet Generic drug: brexpiprazole Take 2 mg by mouth daily.   rizatriptan 10 MG tablet Commonly known as: MAXALT Take 10 mg by mouth as needed.   Spiriva Respimat 1.25 MCG/ACT Aers Generic drug: Tiotropium Bromide Monohydrate Inhale two puffs once daily   topiramate 25 MG tablet Commonly known as: TOPAMAX Take 75 mg by mouth daily.   triamcinolone cream 0.1 % Commonly  known as: KENALOG as needed.   VICTOZA [place] Inject into the skin.   Vitamin D (Ergocalciferol) 1.25 MG (50000 UNIT) Caps capsule Commonly known as: DRISDOL Take by mouth every 7 (seven) days.       Past Medical History:  Diagnosis Date  . Allergic rhinitis   . Asthma   . Cancer of kidney ([place])   . COPD (chronic obstructive pulmonary disease) ([place])   . Dehydration   . GERD (gastroesophageal reflux disease)   . Hypothyroidism   . Kidney disease   . Rheumatoid arthritis ([place])   . Sleep apnea   . Tremor     Past Surgical History:  Procedure Laterality Date  . APPENDECTOMY    . CESAREAN SECTION     2 times  . CHOLECYSTECTOMY    . COLONOSCOPY W/ POLYPECTOMY  2021  . ESOPHAGEAL DILATION  2021  . KIDNEY SURGERY    .  SHOULDER SURGERY    . TONSILLECTOMY    . TOTAL SHOULDER ARTHROPLASTY    . TUBAL LIGATION      Review of systems negative except as noted in HPI / PMHx or noted below:  Review of Systems  Constitutional: Negative.   HENT: Negative.   Eyes: Negative.   Respiratory: Negative.   Cardiovascular: Negative.   Gastrointestinal: Negative.   Genitourinary: Negative.   Musculoskeletal: Negative.   Skin: Negative.   Neurological: Negative.   Endo/Heme/Allergies: Negative.   Psychiatric/Behavioral: Negative.      Objective:   Vitals:   01/26/21 0834  BP: 108/62  Pulse: 92  Resp: 16  SpO2: 97%   Height: 5\' 1"  (154.9 cm)  Weight: 217 lb 9.6 oz (98.7 kg)   Physical Exam Constitutional:      Appearance: She is not diaphoretic.  HENT:     Head: Normocephalic.     Right Ear: Tympanic membrane, ear canal and external ear normal.     Left Ear: Tympanic membrane, ear canal and external ear normal.     Nose: Nose normal. No mucosal edema or rhinorrhea.     Mouth/Throat:     Pharynx: Uvula midline. No oropharyngeal exudate.  Eyes:     Conjunctiva/sclera: Conjunctivae normal.  Neck:     Thyroid: No thyromegaly.     Trachea: Trachea normal. No tracheal  tenderness or tracheal deviation.  Cardiovascular:     Rate and Rhythm: Normal rate and regular rhythm.     Heart sounds: Normal heart sounds, S1 normal and S2 normal. No murmur heard.   Pulmonary:     Effort: No respiratory distress.     Breath sounds: Normal breath sounds. No stridor. No wheezing or rales.  Lymphadenopathy:     Head:     Right side of head: No tonsillar adenopathy.     Left side of head: No tonsillar adenopathy.     Cervical: No cervical adenopathy.  Skin:    Findings: Rash (Angular cheilitis) present. No erythema.     Nails: There is no clubbing.  Neurological:     Mental Status: She is alert.     Diagnostics:    Spirometry was performed and demonstrated an FEV1 of 1.21 at 52 % of predicted.  Assessment and Plan:   1. Asthma, severe persistent, well-controlled   2. Other allergic rhinitis   3. LPRD (laryngopharyngeal reflux disease)   4. Thrush   5. Immunosuppression ([place])     1. Continue Breo 200 - 1 inhalation +  Spiriva 1.25 Respimat - 2 inhalations daily   2. Continue Mepolizumab injections every 4 weeks   3. Continue Montelukast 10 mg - 1 tablet one time per day  4. Continue Flonase - 1-2 sprays each nostril 1 time per day  5. Continue Omeprazole 40 mg - 1 tablet two times per day + Pepcid 20 mg in evening  6. Use Duoneb nebulization or Albuterol HFA 2 puffs every 4-6 hours and Zyrtec if needed.  7. START Nystatin oral solution - 5 mls swish and spit after BREO / Spiriva use  8. Return to clinic in 12 weeks or earlier if problem  Overall Kimberly Jenkins appears to be doing pretty well on her current therapy which includes anti-IL-5 biologic agent and various forms of anti-inflammatory agents for her airway and therapy directed against reflux.  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  I will see her back in this clinic in 12 weeks or earlier if there is a problem while she continues on the plan noted above.  Allena Katz, MD Allergy / Immunology [place]

## 2021-01-27 ENCOUNTER — Encounter: Payer: Self-pay | Admitting: Allergy and Immunology

## 2021-02-07 ENCOUNTER — Other Ambulatory Visit: Payer: Self-pay

## 2021-02-07 ENCOUNTER — Ambulatory Visit (INDEPENDENT_AMBULATORY_CARE_PROVIDER_SITE_OTHER): Payer: Medicare Other | Admitting: *Deleted

## 2021-02-07 DIAGNOSIS — J4550 Severe persistent asthma, uncomplicated: Secondary | ICD-10-CM | POA: Diagnosis not present

## 2021-02-10 ENCOUNTER — Other Ambulatory Visit (HOSPITAL_COMMUNITY): Payer: Self-pay

## 2021-02-14 ENCOUNTER — Other Ambulatory Visit (HOSPITAL_COMMUNITY): Payer: Self-pay

## 2021-02-22 ENCOUNTER — Other Ambulatory Visit (HOSPITAL_COMMUNITY): Payer: Self-pay

## 2021-02-24 ENCOUNTER — Other Ambulatory Visit (HOSPITAL_COMMUNITY): Payer: Self-pay

## 2021-03-06 ENCOUNTER — Other Ambulatory Visit: Payer: Self-pay | Admitting: Physician Assistant

## 2021-03-06 DIAGNOSIS — Z79899 Other long term (current) drug therapy: Secondary | ICD-10-CM

## 2021-03-07 ENCOUNTER — Ambulatory Visit (INDEPENDENT_AMBULATORY_CARE_PROVIDER_SITE_OTHER): Payer: Medicare Other | Admitting: *Deleted

## 2021-03-07 ENCOUNTER — Other Ambulatory Visit: Payer: Self-pay

## 2021-03-07 DIAGNOSIS — J4550 Severe persistent asthma, uncomplicated: Secondary | ICD-10-CM | POA: Diagnosis not present

## 2021-03-07 NOTE — Telephone Encounter (Addendum)
Next Visit: 04/05/2021   Last Visit: 11/03/2020   Last Fill: 12/09/2020   DX: Rheumatoid arthritis of multiple sites with negative rheumatoid factor   Current Dose per office note 11/03/2020, Arava 20 mg by mouth daily    Labs: 11/03/2020, Glucose is elevated-330.  Rest of CMP WNL.  CBC WNL.   Patient advised she is due to update labs. Patient staets she wil lcome into the office to update on 03/08/2021.   OKay to refill arava?

## 2021-03-22 NOTE — Progress Notes (Deleted)
Office Visit Note  Patient: Kimberly Jenkins             Date of Birth: 05/05/1966           MRN: [id number]             PCP: Darrol Jump, NP Referring: Clancy Gourd, NP Visit Date: 04/05/2021 Occupation: '@GUAROCC'$ @  Subjective:  No chief complaint on file.   History of Present Illness: Kimberly Jenkins is a 55 y.o. female ***   Activities of Daily Living:  Patient reports morning stiffness for *** {minute/hour:19697}.   Patient {ACTIONS;DENIES/REPORTS:21021675::"Denies"} nocturnal pain.  Difficulty dressing/grooming: {ACTIONS;DENIES/REPORTS:21021675::"Denies"} Difficulty climbing stairs: {ACTIONS;DENIES/REPORTS:21021675::"Denies"} Difficulty getting out of chair: {ACTIONS;DENIES/REPORTS:21021675::"Denies"} Difficulty using hands for taps, buttons, cutlery, and/or writing: {ACTIONS;DENIES/REPORTS:21021675::"Denies"}  No Rheumatology ROS completed.   PMFS History:  Patient Active Problem List   Diagnosis Date Noted   Pain in right knee 08/28/2019   Fibromyalgia 08/27/2016   History of right rotator cuff tear repair 08/27/2016   Diabetes 08/27/2016   Chronic obstructive pulmonary disease ([place]) 08/27/2016   Dyslipidemia 08/27/2016   History of renal cell carcinoma 08/27/2016   Anxiety 08/27/2016   Sleep apnea 08/27/2016   Rheumatoid arthritis of multiple sites with negative rheumatoid factor  07/18/2016   High risk medication use 07/18/2016   Hand pain 07/13/2016   Foot pain 07/13/2016   Elevated sed rate 07/13/2016   Severe persistent asthma 04/30/2015   Other allergic rhinitis 04/30/2015   GERD (gastroesophageal reflux disease) 04/30/2015    Past Medical History:  Diagnosis Date   Allergic rhinitis    Asthma    Cancer of kidney ([facility])    COPD (chronic obstructive pulmonary disease) ([facility])    Dehydration    GERD (gastroesophageal reflux disease)    Hypothyroidism    Kidney disease    Rheumatoid arthritis ([place])    Sleep apnea    Tremor      Family History  Problem Relation Age of Onset   Brain cancer Brother    Asthma Mother    Lung cancer Mother    Asthma Father    Allergic rhinitis Father    COPD Father    Heart attack Father    Arthritis Father    Brain cancer Paternal Uncle    Asthma Maternal Grandmother    Brain cancer Paternal Grandmother    Heart attack Brother    Past Surgical History:  Procedure Laterality Date   APPENDECTOMY     CESAREAN SECTION     2 times   CHOLECYSTECTOMY     COLONOSCOPY W/ POLYPECTOMY  2021   ESOPHAGEAL DILATION  2021   KIDNEY SURGERY     SHOULDER SURGERY     TONSILLECTOMY     TOTAL SHOULDER ARTHROPLASTY     TUBAL LIGATION     Social History   Social History Narrative   Not on file   Immunization History  Administered Date(s) Administered   PFIZER(Purple Top)SARS-COV-2 Vaccination 11/18/2019, 12/09/2019, 08/05/2020     Objective: Vital Signs: There were no vitals taken for this visit.   Physical Exam   Musculoskeletal Exam: ***  CDAI Exam: CDAI Score: -- Patient Global: --; Provider Global: -- Swollen: --; Tender: -- Joint Exam 04/05/2021   No joint exam has been documented for this visit   There is currently no information documented on the homunculus. Go to the Rheumatology activity and complete the homunculus joint exam.  Investigation: No additional findings.  Imaging: No results found.  Recent Labs:  Lab Results  Component Value Date   WBC 8.4 11/03/2020   HGB 12.7 11/03/2020   PLT 219 11/03/2020   NA 134 (L) 11/03/2020   K 4.3 11/03/2020   CL 100 11/03/2020   CO2 25 11/03/2020   GLUCOSE 330 (H) 11/03/2020   BUN 10 11/03/2020   CREATININE 0.94 11/03/2020   BILITOT 0.3 11/03/2020   ALKPHOS 160 (H) 08/07/2019   AST 13 11/03/2020   ALT 14 11/03/2020   PROT 6.6 11/03/2020   ALBUMIN 4.2 08/07/2019   CALCIUM 8.9 11/03/2020   GFRAA 79 11/03/2020   QFTBGOLD NEGATIVE 06/26/2017   QFTBGOLDPLUS NEGATIVE 11/03/2020    Speciality Comments: No  specialty comments available.  Procedures:  No procedures performed Allergies: Methotrexate, Advair diskus [fluticasone-salmeterol], Aspirin, Ciprofloxacin, Clonidine derivatives, Nystatin, Oxycodone, Serevent [salmeterol], and Cephalexin   Assessment / Plan:     Visit Diagnoses: No diagnosis found.  Orders: No orders of the defined types were placed in this encounter.  No orders of the defined types were placed in this encounter.   Face-to-face time spent with patient was *** minutes. Greater than 50% of time was spent in counseling and coordination of care.  Follow-Up Instructions: No follow-ups on file.   Earnestine Mealing, CMA  Note - This record has been created using Editor, commissioning.  Chart creation errors have been sought, but may not always  have been located. Such creation errors do not reflect on  the standard of medical care.

## 2021-03-23 ENCOUNTER — Other Ambulatory Visit: Payer: Self-pay | Admitting: Physician Assistant

## 2021-03-23 ENCOUNTER — Other Ambulatory Visit (HOSPITAL_COMMUNITY): Payer: Self-pay

## 2021-03-23 MED ORDER — HUMIRA PEN 40 MG/0.8ML ~~LOC~~ PNKT
PEN_INJECTOR | SUBCUTANEOUS | 0 refills | Status: DC
  Filled 2021-04-06: qty 2, 28d supply, fill #0

## 2021-03-23 NOTE — Telephone Encounter (Signed)
Next Visit: 04/05/2021  Last Visit: 11/03/2020  Last Fill: 12/09/2020  [id number] arthritis of multiple sites with negative rheumatoid factor  Current Dose per office note 11/03/2020: Humira 40 mg subcutaneous injections every 14 days   Labs: 11/03/2020 Glucose is elevated-330.  Rest of CMP WNL.  CBC WNL.   TB Gold: 11/03/2020 Neg    Left message to advise patient she is due to update labs.   Okay to refill Humira ?

## 2021-03-24 ENCOUNTER — Other Ambulatory Visit: Payer: Self-pay

## 2021-03-24 DIAGNOSIS — Z79899 Other long term (current) drug therapy: Secondary | ICD-10-CM

## 2021-03-25 LAB — CBC WITH DIFFERENTIAL/PLATELET
Absolute Monocytes: 906 cells/uL (ref 200–950)
Basophils Absolute: 62 cells/uL (ref 0–200)
Basophils Relative: 0.7 %
Eosinophils Absolute: 62 cells/uL (ref 15–500)
Eosinophils Relative: 0.7 %
HCT: 37.6 % (ref 35.0–45.0)
Hemoglobin: 12 g/dL (ref 11.7–15.5)
Lymphs Abs: 2464 cells/uL (ref 850–3900)
MCH: 27.8 pg (ref 27.0–33.0)
MCHC: 31.9 g/dL — ABNORMAL LOW (ref 32.0–36.0)
MCV: 87 fL (ref 80.0–100.0)
MPV: 10.6 fL (ref 7.5–12.5)
Monocytes Relative: 10.3 %
Neutro Abs: 5306 cells/uL (ref 1500–7800)
Neutrophils Relative %: 60.3 %
Platelets: 219 10*3/uL (ref 140–400)
RBC: 4.32 10*6/uL (ref 3.80–5.10)
RDW: 14.2 % (ref 11.0–15.0)
Total Lymphocyte: 28 %
WBC: 8.8 10*3/uL (ref 3.8–10.8)

## 2021-03-25 LAB — COMPLETE METABOLIC PANEL WITH GFR
AG Ratio: 1.2 (calc) (ref 1.0–2.5)
ALT: 19 U/L (ref 6–29)
AST: 19 U/L (ref 10–35)
Albumin: 3.5 g/dL — ABNORMAL LOW (ref 3.6–5.1)
Alkaline phosphatase (APISO): 129 U/L (ref 37–153)
BUN: 7 mg/dL (ref 7–25)
CO2: 25 mmol/L (ref 20–32)
Calcium: 8.5 mg/dL — ABNORMAL LOW (ref 8.6–10.4)
Chloride: 103 mmol/L (ref 98–110)
Creat: 0.93 mg/dL (ref 0.50–1.03)
Globulin: 2.9 g/dL (calc) (ref 1.9–3.7)
Glucose, Bld: 271 mg/dL — ABNORMAL HIGH (ref 65–99)
Potassium: 4.1 mmol/L (ref 3.5–5.3)
Sodium: 135 mmol/L (ref 135–146)
Total Bilirubin: 0.3 mg/dL (ref 0.2–1.2)
Total Protein: 6.4 g/dL (ref 6.1–8.1)
eGFR: 72 mL/min/{1.73_m2} (ref >=60)

## 2021-03-25 NOTE — Progress Notes (Signed)
CBC WNL.  Glucose is elevated-271.  Please notify the patient.  Calcium and albumin are low.  Low serum albumin levels can result in low serum total calcium.  Please forward lab work to PCP.

## 2021-03-28 HISTORY — PX: OTHER SURGICAL HISTORY: SHX169

## 2021-03-31 ENCOUNTER — Other Ambulatory Visit: Payer: Self-pay | Admitting: Physician Assistant

## 2021-03-31 NOTE — Telephone Encounter (Signed)
Next Visit: 04/05/2021   Last Visit: 11/03/2020   Last Fill: 03/07/2021  [id number] arthritis of multiple sites with negative rheumatoid factor   Current Dose per office note 11/03/2020: Arava 20 mg by mouth daily  Labs: 03/24/2021 CBC WNL.  Glucose is elevated-271. Calcium and albumin are low.  Low serum albumin levels can result in low serumtotal calcium.   Okay to refill Arava?

## 2021-04-04 ENCOUNTER — Ambulatory Visit (INDEPENDENT_AMBULATORY_CARE_PROVIDER_SITE_OTHER): Payer: Medicare Other | Admitting: *Deleted

## 2021-04-04 ENCOUNTER — Other Ambulatory Visit: Payer: Self-pay

## 2021-04-04 DIAGNOSIS — J4550 Severe persistent asthma, uncomplicated: Secondary | ICD-10-CM | POA: Diagnosis not present

## 2021-04-05 ENCOUNTER — Ambulatory Visit: Payer: Medicare Other | Admitting: Rheumatology

## 2021-04-05 DIAGNOSIS — R768 Other specified abnormal immunological findings in serum: Secondary | ICD-10-CM

## 2021-04-05 DIAGNOSIS — Z79899 Other long term (current) drug therapy: Secondary | ICD-10-CM

## 2021-04-05 DIAGNOSIS — J449 Chronic obstructive pulmonary disease, unspecified: Secondary | ICD-10-CM

## 2021-04-05 DIAGNOSIS — M797 Fibromyalgia: Secondary | ICD-10-CM

## 2021-04-05 DIAGNOSIS — E785 Hyperlipidemia, unspecified: Secondary | ICD-10-CM

## 2021-04-05 DIAGNOSIS — F5101 Primary insomnia: Secondary | ICD-10-CM

## 2021-04-05 DIAGNOSIS — Z780 Asymptomatic menopausal state: Secondary | ICD-10-CM

## 2021-04-05 DIAGNOSIS — Z8719 Personal history of other diseases of the digestive system: Secondary | ICD-10-CM

## 2021-04-05 DIAGNOSIS — M7062 Trochanteric bursitis, left hip: Secondary | ICD-10-CM

## 2021-04-05 DIAGNOSIS — M0609 Rheumatoid arthritis without rheumatoid factor, multiple sites: Secondary | ICD-10-CM

## 2021-04-05 DIAGNOSIS — Z9889 Other specified postprocedural states: Secondary | ICD-10-CM

## 2021-04-05 DIAGNOSIS — R296 Repeated falls: Secondary | ICD-10-CM

## 2021-04-05 DIAGNOSIS — Z8639 Personal history of other endocrine, nutritional and metabolic disease: Secondary | ICD-10-CM

## 2021-04-05 DIAGNOSIS — M8589 Other specified disorders of bone density and structure, multiple sites: Secondary | ICD-10-CM

## 2021-04-05 DIAGNOSIS — Z85528 Personal history of other malignant neoplasm of kidney: Secondary | ICD-10-CM

## 2021-04-05 DIAGNOSIS — M7061 Trochanteric bursitis, right hip: Secondary | ICD-10-CM

## 2021-04-06 ENCOUNTER — Other Ambulatory Visit (HOSPITAL_COMMUNITY): Payer: Self-pay

## 2021-04-14 ENCOUNTER — Other Ambulatory Visit: Payer: Self-pay | Admitting: Nurse Practitioner

## 2021-04-14 ENCOUNTER — Ambulatory Visit
Admission: RE | Admit: 2021-04-14 | Discharge: 2021-04-14 | Disposition: A | Discharge status: Home / Self Care | Payer: Medicare Other | Source: Ambulatory Visit | Attending: Nurse Practitioner | Admitting: Nurse Practitioner

## 2021-04-14 ENCOUNTER — Other Ambulatory Visit: Payer: Self-pay

## 2021-04-14 DIAGNOSIS — N6489 Other specified disorders of breast: Secondary | ICD-10-CM

## 2021-04-20 ENCOUNTER — Ambulatory Visit (INDEPENDENT_AMBULATORY_CARE_PROVIDER_SITE_OTHER): Payer: Medicare Other | Admitting: Allergy and Immunology

## 2021-04-20 ENCOUNTER — Encounter: Payer: Self-pay | Admitting: Allergy and Immunology

## 2021-04-20 ENCOUNTER — Other Ambulatory Visit: Payer: Self-pay

## 2021-04-20 VITALS — BP 124/82 | HR 100 | Resp 16

## 2021-04-20 DIAGNOSIS — J4550 Severe persistent asthma, uncomplicated: Secondary | ICD-10-CM | POA: Diagnosis not present

## 2021-04-20 DIAGNOSIS — J3089 Other allergic rhinitis: Secondary | ICD-10-CM | POA: Diagnosis not present

## 2021-04-20 DIAGNOSIS — B370 Candidal stomatitis: Secondary | ICD-10-CM

## 2021-04-20 DIAGNOSIS — D849 Immunodeficiency, unspecified: Secondary | ICD-10-CM

## 2021-04-20 DIAGNOSIS — K219 Gastro-esophageal reflux disease without esophagitis: Secondary | ICD-10-CM

## 2021-04-20 NOTE — Patient Instructions (Addendum)
  1. Continue Breo 200 - 1 inhalation +  Spiriva 1.25 Respimat - 2 inhalations daily   2. Continue Mepolizumab injections every 4 weeks   3. Continue Montelukast 10 mg - 1 tablet one time per day  4. Continue Flonase - 1-2 sprays each nostril 1 time per day  5. Continue Omeprazole 40 mg - 1 tablet two times per day + Pepcid 20 mg in evening  6. Continue Nystatin oral solution - 5 mls swish and spit after BREO / Spiriva use  7. Use Duoneb nebulization or Albuterol HFA 2 puffs every 4-6 hours and Zyrtec if needed.  8. Obtain fall flu vaccine  9. Return to clinic in 6 months or earlier if problem

## 2021-04-20 NOTE — Progress Notes (Signed)
[place]   Follow-up Note  Referring Provider: Darrol Jump, NP Primary Provider: Darrol Jump, NP Date of Office Visit: 04/20/2021  Subjective:   Kimberly Jenkins (DOB: 06/30/1966) is a 55 y.o. female who returns to the [facility] on 04/20/2021 in re-evaluation of the following:  HPI: Kimberly Jenkins returns to this clinic in evaluation of severe eosinophilic asthma, allergic rhinitis, and LPR and thrush/angular cheilitis in the context of immunosuppression with adalimumab for rheumatoid arthritis. Her last visit to this clinic was 26 January 2021.  She has done relatively well with her respiratory tract issue while consistently using a large collection of anti-inflammatory agents for her airway including the use of benralizumab injections.  She does not really exercise that much but her requirement for short acting bronchodilator averages out to about 2 times per day.  She has not required a systemic steroid to treat an exacerbation of her asthma during this timeframe.  She apparently has had a CT scan of her sinuses performed in investigation of her chronic cough ordered by her local pulmonologist.  Her nose is doing very well at this point.  Her reflux and throat is doing very well at this point.  She has not been having any problems with the corners of her mouth being irritated at this point while using nystatin.  She continues on immunosuppression with adalimumab for her rheumatoid arthritis.  She is now on Ozempic for her elevated hemoglobin A1c.  Allergies as of 04/20/2021       Reactions   Methotrexate Diarrhea   Advair Diskus [fluticasone-salmeterol]    Aspirin    Ciprofloxacin    Clonidine Derivatives    Nystatin    Oxycodone    Serevent [salmeterol]    Cephalexin Nausea And Vomiting        Medication List    amphetamine-dextroamphetamine 5 MG tablet Commonly known as: ADDERALL Take 1 tablet by  mouth daily.   Breo Ellipta 200-25 MCG/INH Aepb Generic drug: fluticasone furoate-vilanterol Inhale one puff once daily   busPIRone 15 MG tablet Commonly known as: BUSPAR Take one tablet three times daily.   cetirizine 10 MG tablet Commonly known as: ZYRTEC Take 10 mg by mouth daily.   clorazepate 3.75 MG tablet Commonly known as: TRANXENE Take one tablet four times a day as needed.   cyclobenzaprine 10 MG tablet Commonly known as: FLEXERIL Take 10 mg by mouth at bedtime.   donepezil 10 MG tablet Commonly known as: ARICEPT Take 10 mg by mouth daily.   EPINEPHrine 0.3 mg/0.3 mL Soaj injection Commonly known as: EpiPen 2-Pak Use as directed for life-threatening allergic reaction.   ezetimibe 10 MG tablet Commonly known as: ZETIA Take 10 mg by mouth daily.   famotidine 20 MG tablet Commonly known as: PEPCID Take 1 tablet by mouth at bedtime.   FISH OIL PO Take by mouth.   fluconazole 150 MG tablet Commonly known as: DIFLUCAN Take one tablet today, then one tablet in one week. Decrease Primidone to half the usual dose for three days after taking a Fluconazole tablet.   FLUoxetine 20 MG capsule Commonly known as: PROZAC Take 60 mg by mouth daily.   fluticasone 50 MCG/ACT nasal spray Commonly known as: FLONASE Place 2 sprays into both nostrils daily.   folic acid 1 MG tablet Commonly known as: FOLVITE TAKE 2 TABLETS BY MOUTH EVERY DAY   furosemide 40 MG tablet Commonly known as: LASIX Take 20  mg by mouth every other day.   gabapentin 600 MG tablet Commonly known as: NEURONTIN Take 600 mg by mouth 3 (three) times daily.   Humira Pen 40 MG/0.8ML Pnkt Generic drug: Adalimumab INJECT 40 MG INTO THE SKIN EVERY 14 DAYS.   HYDROcodone-acetaminophen 10-325 MG tablet Commonly known as: NORCO Take by mouth.   insulin glargine 100 UNIT/ML Solostar Pen Commonly known as: LANTUS as needed.   Insulin Pen Needle 31G X 8 MM Misc   Ipratropium-Albuterol 20-100  MCG/ACT Aers respimat Commonly known as: COMBIVENT Inhale 1 puff into the lungs every 6 (six) hours as needed for wheezing.   ipratropium-albuterol 0.5-2.5 (3) MG/3ML Soln Commonly known as: DUONEB CAN USE ONE VIAL VIA NEBULIZER EVERY 4-6 HOURS AS NEEDED FOR COUGH OR WHEEZE   lamoTRIgine 25 MG tablet Commonly known as: LAMICTAL Take 25 mg by mouth 2 (two) times daily.   leflunomide 10 MG tablet Commonly known as: ARAVA TAKE 2 TABLETS BY MOUTH EVERY DAY   levothyroxine 50 MCG tablet Commonly known as: SYNTHROID TAKE 1 TABLET BY MOUTH EVERY DAY AT 6 AM   Linzess 72 MCG capsule Generic drug: linaclotide Take 72 mcg by mouth daily.   metFORMIN 500 MG tablet Commonly known as: GLUCOPHAGE Take 500 mg by mouth 2 (two) times daily with a meal.   montelukast 10 MG tablet Commonly known as: SINGULAIR Take 1 tablet (10 mg total) by mouth daily.   Nucala 100 MG injection Generic drug: mepolizumab Inject 100 mg into the skin every 28 (twenty-eight) days.   nystatin 100000 UNIT/ML suspension Commonly known as: MYCOSTATIN Swish and spit 5 mls after Breo/Spiriva use   nystatin cream Commonly known as: MYCOSTATIN as needed.   olopatadine 0.1 % ophthalmic solution Commonly known as: PATANOL   omeprazole 40 MG capsule Commonly known as: PRILOSEC Take 40 mg by mouth 2 (two) times daily.   potassium chloride SA 20 MEQ tablet Commonly known as: KLOR-CON Take by mouth.   pramipexole 0.125 MG tablet Commonly known as: MIRAPEX Take by mouth.   pravastatin 40 MG tablet Commonly known as: PRAVACHOL Take 40 mg by mouth daily.   progesterone 100 MG capsule Commonly known as: PROMETRIUM Take 200 mg by mouth daily.   Rexulti 2 MG Tabs tablet Generic drug: brexpiprazole Take 2 mg by mouth daily.   rizatriptan 10 MG tablet Commonly known as: MAXALT Take 10 mg by mouth as needed.   Spiriva Respimat 1.25 MCG/ACT Aers Generic drug: Tiotropium Bromide Monohydrate Inhale two  puffs once daily   topiramate 25 MG tablet Commonly known as: TOPAMAX Take 75 mg by mouth daily.   triamcinolone cream 0.1 % Commonly known as: KENALOG as needed.   VICTOZA [place] Inject into the skin.   Vitamin D (Ergocalciferol) 1.25 MG (50000 UNIT) Caps capsule Commonly known as: DRISDOL Take by mouth every 7 (seven) days.        Past Medical History:  Diagnosis Date   Allergic rhinitis    Asthma    Cancer of kidney (HCC)    COPD (chronic obstructive pulmonary disease) (HCC)    Dehydration    GERD (gastroesophageal reflux disease)    Hypothyroidism    Kidney disease    Rheumatoid arthritis ([place])    Sleep apnea    Tremor     Past Surgical History:  Procedure Laterality Date   APPENDECTOMY     CESAREAN SECTION     2 times   CHOLECYSTECTOMY     COLONOSCOPY W/ POLYPECTOMY  2021   ESOPHAGEAL DILATION  2021   KIDNEY SURGERY     SHOULDER SURGERY     TONSILLECTOMY     TOTAL SHOULDER ARTHROPLASTY     TUBAL LIGATION      Review of systems negative except as noted in HPI / PMHx or noted below:  Review of Systems  Constitutional: Negative.   HENT: Negative.    Eyes: Negative.   Respiratory: Negative.    Cardiovascular: Negative.   Gastrointestinal: Negative.   Genitourinary: Negative.   Musculoskeletal: Negative.   Skin: Negative.   Neurological: Negative.   Endo/Heme/Allergies: Negative.   Psychiatric/Behavioral: Negative.      Objective:   Vitals:   04/20/21 0821  BP: 124/82  Pulse: 100  Resp: 16  SpO2: 98%          Physical Exam Constitutional:      Appearance: She is not diaphoretic.  HENT:     Head: Normocephalic.     Right Ear: Tympanic membrane, ear canal and external ear normal.     Left Ear: Tympanic membrane, ear canal and external ear normal.     Nose: Nose normal. No mucosal edema or rhinorrhea.     Mouth/Throat:     Pharynx: Uvula midline. No oropharyngeal exudate.  Eyes:     Conjunctiva/sclera: Conjunctivae normal.  Neck:      Thyroid: No thyromegaly.     Trachea: Trachea normal. No tracheal tenderness or tracheal deviation.  Cardiovascular:     Rate and Rhythm: Normal rate and regular rhythm.     Heart sounds: Normal heart sounds, S1 normal and S2 normal. No murmur heard. Pulmonary:     Effort: No respiratory distress.     Breath sounds: Normal breath sounds. No stridor. No wheezing or rales.  Lymphadenopathy:     Head:     Right side of head: No tonsillar adenopathy.     Left side of head: No tonsillar adenopathy.     Cervical: No cervical adenopathy.  Skin:    Findings: No erythema or rash.     Nails: There is no clubbing.  Neurological:     Mental Status: She is alert.    Diagnostics:    Spirometry was performed and demonstrated an FEV1 of 1.64 at 70 % of predicted.   Assessment and Plan:   1. Asthma, severe persistent, well-controlled   2. Other allergic rhinitis   3. LPRD (laryngopharyngeal reflux disease)   4. Thrush   5. Immunosuppression ([place])     1. Continue Breo 200 - 1 inhalation +  Spiriva 1.25 Respimat - 2 inhalations daily   2. Continue Mepolizumab injections every 4 weeks   3. Continue Montelukast 10 mg - 1 tablet one time per day  4. Continue Flonase - 1-2 sprays each nostril 1 time per day  5. Continue Omeprazole 40 mg - 1 tablet two times per day + Pepcid 20 mg in evening  6. Continue Nystatin oral solution - 5 mls swish and spit after BREO / Spiriva use  7. Use Duoneb nebulization or Albuterol HFA 2 puffs every 4-6 hours and Zyrtec if needed.  8. Obtain fall flu vaccine  9. Return to clinic in 6 months or earlier if problem  Overall Kimberly Jenkins appears to be doing relatively well on her current plan which includes a large collection of anti-inflammatory agents for her airway including the use of mepolizumab and also consists of therapy directed against reflux and some fungal overgrowth of her oral cavity.  Assuming  she does well with this plan I will see her back in this  clinic in 6 months or earlier if there is a problem.  Allena Katz, MD Allergy / Immunology [place]

## 2021-04-21 ENCOUNTER — Encounter: Payer: Self-pay | Admitting: Allergy and Immunology

## 2021-04-21 NOTE — Progress Notes (Signed)
Office Visit Note  Patient: Kimberly Jenkins             Date of Birth: 1966-07-26           MRN: [id number]             PCP: Darrol Jump, NP Referring: Darrol Jump, NP Visit Date: 05/05/2021 Occupation: '@GUAROCC'$ @  Subjective:  Generalized pain   History of Present Illness: Kimberly Jenkins is a 55 y.o. female with history of seronegative rheumatoid arthritis and fibromyalgia.  Patient remains on Humira 40 mg subcutaneous injections every 14 days and Arava 20 mg 1 tablet by mouth daily.  She continues to find the current medications to be effective at managing her RA.  She denies missing any doses recently. She has started to walk on a regular basis for exercise.  She has been using a walker to assist with ambulation.  She is working on weight loss.  She continues to have generalized myalgias and muscle tenderness due to fibromyalgia.  She sees [facility] management at [facility].  She remains on gabapentin and Flexeril as prescribed.  She continues to have chronic fatigue secondary to insomnia.  She has occasional nocturnal pain which also contributes to insomnia. She denies any recent infections.     Activities of Daily Living:  Patient reports morning stiffness for several hours.   Patient Reports nocturnal pain.  Difficulty dressing/grooming: Reports Difficulty climbing stairs: Reports Difficulty getting out of chair: Denies Difficulty using hands for taps, buttons, cutlery, and/or writing: Reports  Review of Systems  Constitutional:  Positive for fatigue.  HENT:  Positive for mouth dryness and nose dryness. Negative for mouth sores.   Eyes:  Negative for pain, itching and dryness.  Respiratory:  Negative for shortness of breath and difficulty breathing.   Cardiovascular:  Positive for palpitations. Negative for chest pain.  Gastrointestinal:  Positive for diarrhea. Negative for blood in stool and constipation.  Endocrine: Positive for increased urination.   Genitourinary:  Negative for difficulty urinating and painful urination.  Musculoskeletal:  Positive for joint pain, joint pain, myalgias, morning stiffness, muscle tenderness and myalgias. Negative for joint swelling.  Skin:  Negative for color change, rash and redness.  Allergic/Immunologic: Negative for susceptible to infections.  Neurological:  Positive for memory loss. Negative for dizziness, numbness, headaches and weakness.  Hematological:  Positive for bruising/bleeding tendency.  Psychiatric/Behavioral:  Negative for confusion.    PMFS History:  Patient Active Problem List   Diagnosis Date Noted   Pain in right knee 08/28/2019   Fibromyalgia 08/27/2016   History of right rotator cuff tear repair 08/27/2016   Diabetes 08/27/2016   Chronic obstructive pulmonary disease ([place]) 08/27/2016   Dyslipidemia 08/27/2016   History of renal cell carcinoma 08/27/2016   Anxiety 08/27/2016   Sleep apnea 08/27/2016   Rheumatoid arthritis of multiple sites with negative rheumatoid factor  07/18/2016   High risk medication use 07/18/2016   Hand pain 07/13/2016   Foot pain 07/13/2016   Elevated sed rate 07/13/2016   Severe persistent asthma 04/30/2015   Other allergic rhinitis 04/30/2015   GERD (gastroesophageal reflux disease) 04/30/2015    Past Medical History:  Diagnosis Date   Allergic rhinitis    Asthma    Cancer of kidney (HCC)    COPD (chronic obstructive pulmonary disease) (HCC)    Dehydration    GERD (gastroesophageal reflux disease)    Hypothyroidism    Kidney disease    Rheumatoid arthritis ([place])    Sleep  apnea    Tremor     Family History  Problem Relation Age of Onset   Brain cancer Brother    Asthma Mother    Lung cancer Mother    Asthma Father    Allergic rhinitis Father    COPD Father    Heart attack Father    Arthritis Father    Brain cancer Paternal Uncle    Asthma Maternal Grandmother    Brain cancer Paternal Grandmother    Heart attack Brother     Past Surgical History:  Procedure Laterality Date   APPENDECTOMY     CESAREAN SECTION     2 times   CHOLECYSTECTOMY     COLONOSCOPY W/ POLYPECTOMY  2021   ESOPHAGEAL DILATION  2021   KIDNEY SURGERY     pellet insertion  03/2021   per patient   SHOULDER SURGERY     TONSILLECTOMY     TOTAL SHOULDER ARTHROPLASTY     TUBAL LIGATION     Social History   Social History Narrative   Not on file   Immunization History  Administered Date(s) Administered   PFIZER(Purple Top)SARS-COV-2 Vaccination 11/18/2019, 12/09/2019, 08/05/2020     Objective: Vital Signs: BP 116/79 (BP Location: Left Arm, Patient Position: Sitting, Cuff Size: Normal)   Pulse 77   Ht '5\' 1"'$  (1.549 m)   Wt 215 lb (97.5 kg)   BMI 40.62 kg/m    Physical Exam Vitals and nursing note reviewed.  Constitutional:      Appearance: She is well-developed.  HENT:     Head: Normocephalic and atraumatic.  Eyes:     Conjunctiva/sclera: Conjunctivae normal.  Pulmonary:     Effort: Pulmonary effort is normal.  Abdominal:     Palpations: Abdomen is soft.  Musculoskeletal:     Cervical back: Normal range of motion.  Skin:    General: Skin is warm and dry.     Capillary Refill: Capillary refill takes less than 2 seconds.  Neurological:     Mental Status: She is alert and oriented to person, place, and time.  Psychiatric:        Behavior: Behavior normal.     Musculoskeletal Exam: Generalized hyperalgesia and positive tender points.  C-spine has limited range of motion with lateral rotation.  Postural thoracic kyphosis noted.  Painful range of motion of lumbar spine.  Shoulder joints have slightly limited range of motion with discomfort bilaterally.  Elbow joints, wrist joints, MCPs, PIPs, DIPs have good range of motion with no synovitis.  She is able to make a complete fist bilaterally.  PIP and DIP prominence consistent with osteoarthritis of both hands.  Hip joints have good range of motion.  Tenderness over  bilateral trochanteric bursa noted.  Knee joints have good range of motion with no warmth or effusion.  Ankle joints have good range of motion with no tenderness or joint swelling.  No tenderness over MTP joints.  CDAI Exam: CDAI Score: 0.4  Patient Global: 2 mm; Provider Global: 2 mm Swollen: 0 ; Tender: 0  Joint Exam 05/05/2021   No joint exam has been documented for this visit   There is currently no information documented on the homunculus. Go to the Rheumatology activity and complete the homunculus joint exam.  Investigation: No additional findings.  Imaging: MM DIAG BREAST TOMO UNI RIGHT  Result Date: 04/14/2021 CLINICAL DATA:  55 year old female presenting for delayed follow-up of a probably benign right breast asymmetry without ultrasound correlate. This area was originally left  recommended for biopsy,, but could not be reproduced at that time. EXAM: DIGITAL DIAGNOSTIC UNILATERAL RIGHT MAMMOGRAM WITH TOMOSYNTHESIS AND CAD TECHNIQUE: Right digital diagnostic mammography and breast tomosynthesis was performed. The images were evaluated with computer-aided detection. COMPARISON:  Previous exam(s). ACR Breast Density Category b: There are scattered areas of fibroglandular density. FINDINGS: An asymmetry in the upper right breast at posterior depth is seen on the MLO projection is similar when compared to screening study dated 07/20/2020. The appearance is most suggestive of an [place] of fibroglandular tissue otherwise, no new or suspicious findings. IMPRESSION: Stable, probably benign right breast asymmetry recommend continued short-term imaging follow-up. RECOMMENDATION: Bilateral diagnostic mammogram in November 2022. I have discussed the findings and recommendations with the patient. If applicable, a reminder letter will be sent to the patient regarding the next appointment. BI-RADS CATEGORY  3: Probably benign. Electronically Signed   By: Kristopher Oppenheim M.D.   On: 04/14/2021 13:44    Recent Labs: Lab Results  Component Value Date   WBC 8.8 03/24/2021   HGB 12.0 03/24/2021   PLT 219 03/24/2021   NA 135 03/24/2021   K 4.1 03/24/2021   CL 103 03/24/2021   CO2 25 03/24/2021   GLUCOSE 271 (H) 03/24/2021   BUN 7 03/24/2021   CREATININE 0.93 03/24/2021   BILITOT 0.3 03/24/2021   ALKPHOS 160 (H) 08/07/2019   AST 19 03/24/2021   ALT 19 03/24/2021   PROT 6.4 03/24/2021   ALBUMIN 4.2 08/07/2019   CALCIUM 8.5 (L) 03/24/2021   GFRAA 79 11/03/2020   QFTBGOLD NEGATIVE 06/26/2017   QFTBGOLDPLUS NEGATIVE 11/03/2020    Speciality Comments: No specialty comments available.  Procedures:  No procedures performed Allergies: Methotrexate, Advair diskus [fluticasone-salmeterol], Aspirin, Ciprofloxacin, Clonidine derivatives, Nystatin, Oxycodone, Serevent [salmeterol], and Cephalexin   Assessment / Plan:     Visit Diagnoses: Rheumatoid arthritis of multiple sites with negative rheumatoid factor: She has no joint tenderness or synovitis on examination today.  She has not had any recent rheumatoid arthritis flares.  She is clinically doing well on Humira 40 mg subcutaneous injections every 14 days and Arava 20 mg 1 tablet by mouth daily.  She has not missed any doses of these medications recently and continues to tolerate them without any side effects.  She has not had any recent infections.  She experiences some increased pain and stiffness typically on the day she is due for her Humira injections.  Her most recent injection was today.  She has no inflammation on examination today.  She will continue on the current treatment regimen.  We discussed the importance of regular exercise and joint protection.  She was advised to notify us if she develops increased joint pain or joint swelling.  She will follow-up in the office in 5 months.  High risk medication use - Humira 40 mg subcutaneous injections every 14 days (11/02/2016), Arava 20 mg by mouth daily (12/13/2016) (inadequate  response to Enbrel, side effects from MTX). CBC and CMP were drawn on 03/24/2021.  She will be due to update lab work in October and every 3 months to monitor for drug toxicity.  Standing orders for CBC and CMP remain in place.  TB Gold negative on 11/03/2020 and will continue to be monitored yearly. She has not had any recent infections.  Discussed the importance of holding humira and arava if she develops signs or symptoms of an infection and to resume once the infection has completely cleared. She continues to have yearly skin exams while  on Humira.   Fibromyalgia: She has generalized hyperalgesia and positive tender points on examination today.  She experiences intermittent myalgias and muscle tenderness typically exacerbated by weather changes.  She continues to go to pain management which helps to alleviate some of her discomfort.  She also remains on Flexeril and gabapentin as prescribed.  Discussed the importance of regular exercise and good sleep hygiene.    Trochanteric bursitis, left hip: She has good range of motion of the left hip joint.  Tenderness over the left trochanteric bursa noted.  Discussed the importance of performing regular stretching exercises.  Trochanteric bursitis, right hip: She has tenderness over the right trochanteric bursa on examination today.  Discussed the importance of performing stretching exercises on a daily basis.  Primary insomnia: She experiences occasional nocturnal pain which contributes to insomnia.  Positive ANA (antinuclear antibody: She has no clinical features of systemic lupus at this time.  Osteopenia of multiple sites - DEXA updated on 12/12/2019: AP spine BMD 0.842 with T score -1.9.  She has not had any recent falls or fractures.  Other medical conditions are listed as follows:   History of rotator cuff surgery  Dyslipidemia  Chronic obstructive pulmonary disease, unspecified COPD type ([place])  History of diabetes mellitus  History of  gastroesophageal reflux (GERD)  History of hypothyroidism  History of renal cell carcinoma  Postmenopausal  Orders: No orders of the defined types were placed in this encounter.  No orders of the defined types were placed in this encounter.     Follow-Up Instructions: Return in about 5 months (around 10/05/2021) for Rheumatoid arthritis, Fibromyalgia.   Ofilia Neas, PA-C  Note - This record has been created using Dragon software.  Chart creation errors have been sought, but may not always  have been located. Such creation errors do not reflect on  the standard of medical care.

## 2021-05-03 ENCOUNTER — Ambulatory Visit: Payer: Medicare Other

## 2021-05-04 ENCOUNTER — Ambulatory Visit (INDEPENDENT_AMBULATORY_CARE_PROVIDER_SITE_OTHER): Payer: Medicare Other | Admitting: *Deleted

## 2021-05-04 ENCOUNTER — Other Ambulatory Visit (HOSPITAL_COMMUNITY): Payer: Self-pay

## 2021-05-04 ENCOUNTER — Other Ambulatory Visit: Payer: Self-pay

## 2021-05-04 ENCOUNTER — Ambulatory Visit: Payer: Medicare Other | Admitting: Physician Assistant

## 2021-05-04 DIAGNOSIS — J4550 Severe persistent asthma, uncomplicated: Secondary | ICD-10-CM

## 2021-05-05 ENCOUNTER — Ambulatory Visit (INDEPENDENT_AMBULATORY_CARE_PROVIDER_SITE_OTHER): Payer: Medicare Other | Admitting: Physician Assistant

## 2021-05-05 ENCOUNTER — Encounter: Payer: Self-pay | Admitting: Physician Assistant

## 2021-05-05 VITALS — BP 116/79 | HR 77 | Ht 61.0 in | Wt 215.0 lb

## 2021-05-05 DIAGNOSIS — M797 Fibromyalgia: Secondary | ICD-10-CM

## 2021-05-05 DIAGNOSIS — Z8719 Personal history of other diseases of the digestive system: Secondary | ICD-10-CM

## 2021-05-05 DIAGNOSIS — Z780 Asymptomatic menopausal state: Secondary | ICD-10-CM

## 2021-05-05 DIAGNOSIS — Z9889 Other specified postprocedural states: Secondary | ICD-10-CM | POA: Diagnosis not present

## 2021-05-05 DIAGNOSIS — Z79899 Other long term (current) drug therapy: Secondary | ICD-10-CM

## 2021-05-05 DIAGNOSIS — F5101 Primary insomnia: Secondary | ICD-10-CM

## 2021-05-05 DIAGNOSIS — M7061 Trochanteric bursitis, right hip: Secondary | ICD-10-CM

## 2021-05-05 DIAGNOSIS — M0609 Rheumatoid arthritis without rheumatoid factor, multiple sites: Secondary | ICD-10-CM | POA: Diagnosis not present

## 2021-05-05 DIAGNOSIS — Z85528 Personal history of other malignant neoplasm of kidney: Secondary | ICD-10-CM

## 2021-05-05 DIAGNOSIS — R296 Repeated falls: Secondary | ICD-10-CM

## 2021-05-05 DIAGNOSIS — R768 Other specified abnormal immunological findings in serum: Secondary | ICD-10-CM

## 2021-05-05 DIAGNOSIS — M7062 Trochanteric bursitis, left hip: Secondary | ICD-10-CM

## 2021-05-05 DIAGNOSIS — J449 Chronic obstructive pulmonary disease, unspecified: Secondary | ICD-10-CM

## 2021-05-05 DIAGNOSIS — M8589 Other specified disorders of bone density and structure, multiple sites: Secondary | ICD-10-CM

## 2021-05-05 DIAGNOSIS — Z8639 Personal history of other endocrine, nutritional and metabolic disease: Secondary | ICD-10-CM

## 2021-05-05 DIAGNOSIS — E785 Hyperlipidemia, unspecified: Secondary | ICD-10-CM

## 2021-05-05 NOTE — Patient Instructions (Signed)
Standing Labs We placed an order today for your standing lab work.   Please have your standing labs drawn in October and every 3 months   If possible, please have your labs drawn 2 weeks prior to your appointment so that the provider can discuss your results at your appointment.  Please note that you may see your imaging and lab results in MyChart before we have reviewed them. We may be awaiting multiple results to interpret others before contacting you. Please allow our office up to 72 hours to thoroughly review all of the results before contacting the office for clarification of your results.  We have open lab daily: Monday through Thursday from 1:30-4:30 PM and Friday from 1:30-4:00 PM at the office of Dr. Shaili Deveshwar, [facility].   Please be advised, all patients with office appointments requiring lab work will take precedent over walk-in lab work.  If possible, please come for your lab work on Monday and Friday afternoons, as you may experience shorter wait times. The office is located at [address], [address], [place], [place] [address] No appointment is necessary.   Labs are drawn by [facility]. Please bring your co-pay at the time of your lab draw.  You may receive a bill from [facility] for your lab work.  If you wish to have your labs drawn at another location, please call the office 24 hours in advance to send orders.  If you have any questions regarding directions or hours of operation,  please call [phone].   As a reminder, please drink plenty of water prior to coming for your lab work. Thanks!  

## 2021-05-06 ENCOUNTER — Other Ambulatory Visit (HOSPITAL_COMMUNITY): Payer: Self-pay

## 2021-05-09 ENCOUNTER — Other Ambulatory Visit (HOSPITAL_COMMUNITY): Payer: Self-pay

## 2021-05-09 ENCOUNTER — Other Ambulatory Visit: Payer: Self-pay | Admitting: Physician Assistant

## 2021-05-09 MED ORDER — HUMIRA PEN 40 MG/0.8ML ~~LOC~~ PNKT
PEN_INJECTOR | SUBCUTANEOUS | 0 refills | Status: DC
  Filled 2021-05-16: qty 2, 28d supply, fill #0
  Filled 2021-06-09: qty 2, 28d supply, fill #1
  Filled 2021-07-25: qty 2, 28d supply, fill #2

## 2021-05-09 NOTE — Telephone Encounter (Signed)
Next Visit: 10/06/2021  Last Visit: 05/05/2021  Last Fill: 03/23/2021 (30 day supply)   DX: Rheumatoid arthritis of multiple sites with negative rheumatoid factor  Current Dose per office note on 05/05/2021: Humira 40 mg subcutaneous injections every 14 days  Labs: 03/24/2021 CBC WNL.  Glucose is elevated-271. Calcium and albumin are low.  Low serum albumin levels can result in low serum total calcium.   TB Gold: 11/03/2020 negative    Okay to refill Humira?

## 2021-05-10 ENCOUNTER — Other Ambulatory Visit (HOSPITAL_COMMUNITY): Payer: Self-pay

## 2021-05-16 ENCOUNTER — Other Ambulatory Visit (HOSPITAL_COMMUNITY): Payer: Self-pay

## 2021-06-01 ENCOUNTER — Ambulatory Visit: Payer: Medicare Other

## 2021-06-09 ENCOUNTER — Other Ambulatory Visit (HOSPITAL_COMMUNITY): Payer: Self-pay

## 2021-06-09 ENCOUNTER — Other Ambulatory Visit: Payer: Self-pay

## 2021-06-09 ENCOUNTER — Ambulatory Visit (INDEPENDENT_AMBULATORY_CARE_PROVIDER_SITE_OTHER): Payer: Medicare Other | Admitting: *Deleted

## 2021-06-09 DIAGNOSIS — J4550 Severe persistent asthma, uncomplicated: Secondary | ICD-10-CM | POA: Diagnosis not present

## 2021-06-16 ENCOUNTER — Other Ambulatory Visit (HOSPITAL_COMMUNITY): Payer: Self-pay

## 2021-06-26 ENCOUNTER — Other Ambulatory Visit: Payer: Self-pay | Admitting: Physician Assistant

## 2021-06-27 ENCOUNTER — Telehealth: Payer: Self-pay

## 2021-06-27 ENCOUNTER — Other Ambulatory Visit: Payer: Self-pay | Admitting: Rheumatology

## 2021-06-27 ENCOUNTER — Other Ambulatory Visit: Payer: Self-pay | Admitting: *Deleted

## 2021-06-27 DIAGNOSIS — Z111 Encounter for screening for respiratory tuberculosis: Secondary | ICD-10-CM

## 2021-06-27 DIAGNOSIS — Z9225 Personal history of immunosupression therapy: Secondary | ICD-10-CM

## 2021-06-27 DIAGNOSIS — Z79899 Other long term (current) drug therapy: Secondary | ICD-10-CM

## 2021-06-27 NOTE — Telephone Encounter (Signed)
Lab Orders faxed

## 2021-06-27 NOTE — Telephone Encounter (Signed)
Patient called stating she went to [facility] in [place] this morning and was told they didn't have her orders.  Patient was given a fax number to give to the office and will contact her when they receive it.   Fax [phone]

## 2021-06-27 NOTE — Telephone Encounter (Signed)
Next Visit: 10/06/2021  Last Visit: 05/05/2021  Last Fill: 03/31/2021  WU:[id number] arthritis of multiple sites with negative rheumatoid factor  Current Dose per office note 05/05/2021: Arava 20 mg 1 tablet by mouth daily  Labs: 03/24/2021 CBC WNL.  Glucose is elevated-271. Calcium and albumin are low.  Low serum albumin levels can result in low serum total calcium.    Patient advised she is due to update labs. Patient states she will update today. Lab Orders released.   Okay to refill Arava?

## 2021-06-28 LAB — CMP14+EGFR
ALT: 20 IU/L (ref 0–32)
AST: 22 IU/L (ref 0–40)
Albumin/Globulin Ratio: 1.6 (ref 1.2–2.2)
Albumin: 4.1 g/dL (ref 3.8–4.9)
Alkaline Phosphatase: 135 IU/L — ABNORMAL HIGH (ref 44–121)
BUN/Creatinine Ratio: 10 (ref 9–23)
BUN: 9 mg/dL (ref 6–24)
Bilirubin Total: 0.3 mg/dL (ref 0.0–1.2)
CO2: 21 mmol/L (ref 20–29)
Calcium: 8.6 mg/dL — ABNORMAL LOW (ref 8.7–10.2)
Chloride: 102 mmol/L (ref 96–106)
Creatinine, Ser: 0.94 mg/dL (ref 0.57–1.00)
Globulin, Total: 2.5 g/dL (ref 1.5–4.5)
Glucose: 130 mg/dL — ABNORMAL HIGH (ref 70–99)
Potassium: 3.8 mmol/L (ref 3.5–5.2)
Sodium: 136 mmol/L (ref 134–144)
Total Protein: 6.6 g/dL (ref 6.0–8.5)
eGFR: 71 mL/min/{1.73_m2} (ref >59)

## 2021-06-28 LAB — CBC WITH DIFFERENTIAL/PLATELET
Basophils Absolute: 0.1 10*3/uL (ref 0.0–0.2)
Basos: 1 %
EOS (ABSOLUTE): 0.1 10*3/uL (ref 0.0–0.4)
Eos: 1 %
Hematocrit: 38.1 % (ref 34.0–46.6)
Hemoglobin: 12.5 g/dL (ref 11.1–15.9)
Immature Grans (Abs): 0 10*3/uL (ref 0.0–0.1)
Immature Granulocytes: 0 %
Lymphocytes Absolute: 2.3 10*3/uL (ref 0.7–3.1)
Lymphs: 35 %
MCH: 28.3 pg (ref 26.6–33.0)
MCHC: 32.8 g/dL (ref 31.5–35.7)
MCV: 86 fL (ref 79–97)
Monocytes Absolute: 0.8 10*3/uL (ref 0.1–0.9)
Monocytes: 11 %
Neutrophils Absolute: 3.5 10*3/uL (ref 1.4–7.0)
Neutrophils: 52 %
Platelets: 242 10*3/uL (ref 150–450)
RBC: 4.42 x10E6/uL (ref 3.77–5.28)
RDW: 13.8 % (ref 11.7–15.4)
WBC: 6.7 10*3/uL (ref 3.4–10.8)

## 2021-06-30 ENCOUNTER — Encounter: Payer: Self-pay | Admitting: *Deleted

## 2021-06-30 ENCOUNTER — Encounter: Payer: Self-pay | Admitting: Cardiology

## 2021-07-07 ENCOUNTER — Other Ambulatory Visit: Payer: Self-pay

## 2021-07-07 ENCOUNTER — Ambulatory Visit (INDEPENDENT_AMBULATORY_CARE_PROVIDER_SITE_OTHER): Payer: Medicare Other

## 2021-07-07 DIAGNOSIS — J4550 Severe persistent asthma, uncomplicated: Secondary | ICD-10-CM | POA: Diagnosis not present

## 2021-07-11 DIAGNOSIS — C649 Malignant neoplasm of unspecified kidney, except renal pelvis: Secondary | ICD-10-CM | POA: Insufficient documentation

## 2021-07-11 DIAGNOSIS — E860 Dehydration: Secondary | ICD-10-CM | POA: Insufficient documentation

## 2021-07-11 DIAGNOSIS — N289 Disorder of kidney and ureter, unspecified: Secondary | ICD-10-CM | POA: Insufficient documentation

## 2021-07-11 NOTE — Progress Notes (Addendum)
Cardiology Office Note:    Date:  07/12/2021   ID:  Kimberly Jenkins, DOB 23-Sep-1965, MRN [id number]  PCP:  Darrol Jump, NP  Cardiologist:  Shirlee More, MD   Referring MD: Darrol Jump, NP  ASSESSMENT:    1. First degree atrioventricular block   2. Essential hypertension   3. Hypercholesterolemia    PLAN:     I have addended the note, unfortunately dictation he is the term alcohol in place of Adderall and I have corrected the medical record.  In order of problems listed above:  I reviewed her monitor she has physiologic first-degree AV block no evidence of significant conduction system disease and I will think there is any concern of clinical bradycardia or needing device therapy.  She had occasional atrial and ventricular premature contractions no severe or sustained arrhythmia and I told her it is conceivable that her Adderall may play a role in her palpitation.  I do not think she needs to stop the drug.  I requested a copy of the echocardiogram and provided she reassuring I think she requires further cardiac evaluation at this time.  She already avoids over-the-counter proarrhythmic drugs Stable hypertension blood pressure is at target she takes a loop diuretic every other day for edema note she is taking gabapentin that can cause sodium retention continue the same Stable continue her statin  Next appointment I will see her back in my office as needed   Medication Adjustments/Labs and Tests Ordered: Current medicines are reviewed at length with the patient today.  Concerns regarding medicines are outlined above.  No orders of the defined types were placed in this encounter.  No orders of the defined types were placed in this encounter.    Chief Complaint  Patient presents with   heart block    First degree AVB     History of Present Illness:    Kimberly Jenkins is a 55 y.o. female who is being seen today for the evaluation of heart block at the  request of Darrol Jump, NP.  She utilized an event monitor that showed first-degree AV block rare APCs and PVCs.  She was seen by me 2016 with hypertension chronic lung disease sleep apnea poorly tolerant of CPAP with peripheral edema and shortness of breath felt to be due to chronic lung disease and sleep apnea she was on a loop diuretic and is taking gabapentin that caused marked sodium retention.  She had previous testing including myocardial perfusion study and echocardiogram in 2016, reports cannot be viewed with in [place].  She remembers seeing me back in 2016 and tells me at that time her testing was normal She has a history of obstructive sleep apnea compliant with CPAP and COPD. Overall she has been doing well she does not have any severe shortness of breath edema chest pain or syncope. She takes Adderall for attention disorder avoids over-the-counter proarrhythmic drugs Recently she had some fluttering in her chest not severe or sustained on 1 event monitor. She also tells me she had an echocardiogram performed in her PCP office and was told the results were good I requested a copy She does not have any sustained arrhythmia syncope or near syncope. 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 Overview:  Normal pharmacologic MPS Formatting of this note might be different from the original. Normal pharmacologic MPS Formatting of this note might be different from the original. Formatting of this note might be different from the original. Normal pharmacologic MPS Formatting of this note might be different from the original. Form   Chronic  obstructive pulmonary disease ([place]) 08/27/2016   Chronic post-traumatic headache 02/17/2010   Current mild episode of major depressive disorder without prior episode ([place]) 12/12/2017   Dehydration    Depression, unspecified 02/17/2010   Diabetes 08/27/2016   Dyslipidemia 08/27/2016   Edema 05/10/2020   Elevated sed rate 07/13/2016   48 on 05/2016   Epistaxis 05/10/2020   Essential hypertension 05/18/2015   Fibroma 05/10/2020   Fibromyalgia 08/27/2016   Fluid overload 05/07/2020   Foot pain 07/13/2016   Generalized anxiety disorder 05/12/2015   GERD (gastroesophageal reflux disease)    Hand pain 07/13/2016   High risk medication use 07/18/2016   History of malignant neoplasm of kidney 02/17/2010   Formatting of this note might be different from the original. Formatting of this note might be different from the original. Right partial nephrectomy 2008 Formatting of this note might be different from the original. Overview:  Right partial nephrectomy 2008   History of renal cell carcinoma 08/27/2016   Right partial nephrectomy 2008   Hypercholesterolemia 05/10/2020   Formatting of this note might be different from the original. Formatting of this note might be different from the original. 12/07/2014 - TC 151/Trig 199/HDL 47/LDL 64  Lp(a) 10 ( <30)   Hyperlipidemia associated with type 2 diabetes mellitus ([place]) 02/17/2010   Formatting of this note might be different from the original. Overview:  12/07/2014 - TC 151/Trig 199/HDL 47/LDL 64  Lp(a) 10 ( <30)  Overview:  12/07/2014 - TC 151/Trig 199/HDL 47/LDL 64  Lp(a) 10 ( <30) Formatting of this note might be different from the original. 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   Kidney disease    Migraine, unspecified, not intractable, without status migrainosus 05/10/2020    Mood disorder ([place]) 06/17/2015   Obesity 03/01/2015   Open wound of toe 05/03/2016   Other allergic rhinitis 04/30/2015   Other amnesia 02/17/2010   Other chronic diseases of tonsils and adenoids 05/10/2020   Other specified personal risk factors, not elsewhere classified 07/18/2016   Pain in right knee 08/28/2019   Rheumatoid arthritis ([place])    Risk for falls 05/02/2016   Severe persistent asthma 04/30/2015   Sleep apnea 08/27/2016   On CPAP, 2 L oxygen   Sleep disturbance 02/17/2010   Spinal stenosis in cervical region 02/17/2010   Tremor    Vitamin D deficiency 07/08/2015    Past Surgical History:  Procedure Laterality Date   APPENDECTOMY  1983   CESAREAN SECTION     2 times, 1986 & 1983   CHOLECYSTECTOMY  2009   COLONOSCOPY W/ POLYPECTOMY  2021   ELBOW SURGERY Right    ulner nerve release   ESOPHAGEAL DILATION  2021   KIDNEY SURGERY Right 2008   1/2 right kidney removed   pellet insertion  03/2021   per patient   SHOULDER SURGERY Right  2006   TONSILLECTOMY  2011   TOTAL SHOULDER ARTHROPLASTY     TUBAL LIGATION  1986   WRIST SURGERY Right    ulner nerve release    Current Medications: Current Meds  Medication Sig   Adalimumab (HUMIRA PEN) 40 MG/0.8ML PNKT INJECT 40 MG INTO THE SKIN EVERY 14 DAYS.   amphetamine-dextroamphetamine (ADDERALL) 5 MG tablet Take 2 tablets by mouth daily.   BREO ELLIPTA 200-25 MCG/INH AEPB Inhale one puff once daily   busPIRone (BUSPAR) 15 MG tablet Take 15 mg by mouth 3 (three) times daily.   cetirizine (ZYRTEC) 10 MG tablet Take 10 mg by mouth daily.   clorazepate (TRANXENE) 3.75 MG tablet Take 3.75 mg by mouth 4 (four) times daily.   cyclobenzaprine (FLEXERIL) 10 MG tablet Take 10 mg by mouth 2 (two) times daily.   EPINEPHrine (EPIPEN 2-PAK) 0.3 mg/0.3 mL IJ SOAJ injection Use as directed for life-threatening allergic reaction.   ergocalciferol (VITAMIN D2) 1.25 MG (50000 UT) capsule Take 5,000 Units by mouth once a week. One time  per week for 16 doses   famotidine (PEPCID) 20 MG tablet Take 1 tablet by mouth at bedtime.   FLUoxetine (PROZAC) 40 MG capsule Take 80 mg by mouth daily.   fluticasone (FLONASE) 50 MCG/ACT nasal spray Place 2 sprays into both nostrils daily.   furosemide (LASIX) 40 MG tablet Take 40 mg by mouth daily.   gabapentin (NEURONTIN) 600 MG tablet Take 600 mg by mouth 3 (three) times daily.   HYDROcodone-acetaminophen (NORCO) 10-325 MG tablet Take 1 tablet by mouth 4 (four) times daily.   icosapent Ethyl (VASCEPA) 1 g capsule Take 2 capsules by mouth 2 (two) times daily.   insulin glargine (LANTUS SOLOSTAR) 100 UNIT/ML Solostar Pen Inject 45 Units into the skin at bedtime.   Ipratropium-Albuterol (COMBIVENT) 20-100 MCG/ACT AERS respimat Inhale 1 puff into the lungs every 6 (six) hours as needed for wheezing.   ipratropium-albuterol (DUONEB) 0.5-2.5 (3) MG/3ML SOLN CAN USE ONE VIAL VIA NEBULIZER EVERY 4-6 HOURS AS NEEDED FOR COUGH OR WHEEZE   leflunomide (ARAVA) 10 MG tablet Take 10 mg by mouth daily.   levothyroxine (SYNTHROID, LEVOTHROID) 50 MCG tablet TAKE 1 TABLET BY MOUTH EVERY DAY AT 6 AM   LINZESS 72 MCG capsule Take 72 mcg by mouth daily.   mesalamine (LIALDA) 1.2 g EC tablet Take 2.4 g by mouth daily.   metFORMIN (GLUCOPHAGE) 500 MG tablet Take 500 mg by mouth 2 (two) times daily with a meal.   montelukast (SINGULAIR) 10 MG tablet Take 10 mg by mouth at bedtime.   naloxone (NARCAN) nasal spray 4 mg/0.1 mL Place 1 spray into the nose as directed.   nystatin (MYCOSTATIN) 100000 UNIT/ML suspension Swish and spit 5 mls after Breo/Spiriva use   nystatin cream (MYCOSTATIN) Apply 1 application topically as needed for dry skin.   olopatadine (PATANOL) 0.1 % ophthalmic solution Place 1 drop into both eyes 2 (two) times daily.   omeprazole (PRILOSEC) 40 MG capsule Take 40 mg by mouth 2 (two) times daily.   ondansetron (ZOFRAN) 8 MG tablet Take 8 mg by mouth every 8 (eight) hours as needed for nausea or  vomiting.   potassium chloride SA (K-DUR,KLOR-CON) 20 MEQ tablet Take 20 mEq by mouth 2 (two) times daily.   pramipexole (MIRAPEX) 0.125 MG tablet Take 0.125 mg by mouth daily.   pravastatin (PRAVACHOL) 40 MG tablet Take 40 mg by mouth daily.   primidone (MYSOLINE) 250 MG tablet Take 250  mg by mouth at bedtime.   REXULTI 2 MG TABS tablet Take 2 mg by mouth daily.   Semaglutide,0.25 or 0.5MG /DOS, (OZEMPIC, 0.25 OR 0.5 MG/DOSE,) 2 MG/1.5ML SOPN Inject 0.25-0.5 mg into the skin once a week.   SPIRIVA RESPIMAT 1.25 MCG/ACT AERS Inhale two puffs once daily   topiramate (TOPAMAX) 50 MG tablet Take 50 mg by mouth 2 (two) times daily.   triamcinolone cream (KENALOG) 0.1 % Apply 1 application topically as needed (rash).   Current Facility-Administered Medications for the 07/12/21 encounter (Office Visit) with Richardo Priest, MD  Medication   Mepolizumab SOLR 100 mg     Allergies:   Methotrexate, Advair diskus [fluticasone-salmeterol], Aspirin, Ciprofloxacin, Clonidine derivatives, Nystatin, Oxycodone, Serevent [salmeterol], and Cephalexin   Social History   Socioeconomic History   Marital status: Single    Spouse name: Not on file   Number of children: Not on file   Years of education: Not on file   Highest education level: Not on file  Occupational History   Not on file  Tobacco Use   Smoking status: Never   Smokeless tobacco: Never  Vaping Use   Vaping Use: Never used  Substance and Sexual Activity   Alcohol use: No   Drug use: No   Sexual activity: Not on file  Other Topics Concern   Not on file  Social History Narrative   Not on file   Social Determinants of Health   Financial Resource Strain: Not on file  Food Insecurity: Not on file  Transportation Needs: Not on file  Physical Activity: Not on file  Stress: Not on file  Social Connections: Not on file     Family History: The patient's family history includes Allergic rhinitis in her father; Arthritis in her father;  Asthma in her father, maternal grandmother, and mother; Brain cancer in her brother, paternal grandmother, and paternal uncle; COPD in her father; Heart attack in her brother and father; Lung cancer in her mother.  ROS:   ROS Please see the history of present illness.     All other systems reviewed and are negative.  EKGs/Labs/Other Studies Reviewed:    The following studies were reviewed today:   EKG:  EKG is  ordered today.  The ekg ordered today is personally reviewed and demonstrates sinus rhythm and is normal  Recent Labs: 06/27/2021: ALT 20; BUN 9; Creatinine, Ser 0.94; Hemoglobin 12.5; Platelets 242; Potassium 3.8; Sodium 136  Recent Lipid Panel No results found for: CHOL, TRIG, HDL, CHOLHDL, VLDL, LDLCALC, LDLDIRECT  Physical Exam:    VS:  BP 108/72   Pulse 87   Ht 5\' 1"  (1.549 m)   Wt 203 lb (92.1 kg)   SpO2 97%   BMI 38.36 kg/m     Wt Readings from Last 3 Encounters:  07/12/21 203 lb (92.1 kg)  06/01/21 207 lb (93.9 kg)  05/05/21 215 lb (97.5 kg)     GEN: She looks her age well nourished, well developed in no acute distress HEENT: Normal NECK: No JVD; No carotid bruits LYMPHATICS: No lymphadenopathy CARDIAC: RRR, no murmurs, rubs, gallops RESPIRATORY:  Clear to auscultation without rales, wheezing or rhonchi  ABDOMEN: Soft, non-tender, non-distended MUSCULOSKELETAL:  No edema; No deformity  SKIN: Warm and dry NEUROLOGIC:  Alert and oriented x 3 PSYCHIATRIC:  Normal affect     Signed, Shirlee More, MD  07/12/2021 9:37 AM    [place]

## 2021-07-12 ENCOUNTER — Encounter: Payer: Self-pay | Admitting: Cardiology

## 2021-07-12 ENCOUNTER — Other Ambulatory Visit: Payer: Self-pay

## 2021-07-12 ENCOUNTER — Ambulatory Visit (INDEPENDENT_AMBULATORY_CARE_PROVIDER_SITE_OTHER): Payer: Medicare Other | Admitting: Cardiology

## 2021-07-12 VITALS — BP 108/72 | HR 87 | Ht 61.0 in | Wt 203.0 lb

## 2021-07-12 DIAGNOSIS — I10 Essential (primary) hypertension: Secondary | ICD-10-CM | POA: Diagnosis not present

## 2021-07-12 DIAGNOSIS — I440 Atrioventricular block, first degree: Secondary | ICD-10-CM | POA: Diagnosis not present

## 2021-07-12 DIAGNOSIS — E7800 Pure hypercholesterolemia, unspecified: Secondary | ICD-10-CM

## 2021-07-12 NOTE — Patient Instructions (Signed)

## 2021-07-13 ENCOUNTER — Other Ambulatory Visit (HOSPITAL_COMMUNITY): Payer: Self-pay

## 2021-07-25 ENCOUNTER — Other Ambulatory Visit: Payer: Self-pay

## 2021-07-25 ENCOUNTER — Ambulatory Visit
Admission: RE | Admit: 2021-07-25 | Discharge: 2021-07-25 | Disposition: A | Discharge status: Home / Self Care | Payer: Medicare Other | Source: Ambulatory Visit | Attending: Nurse Practitioner | Admitting: Nurse Practitioner

## 2021-07-25 ENCOUNTER — Other Ambulatory Visit (HOSPITAL_COMMUNITY): Payer: Self-pay

## 2021-07-25 DIAGNOSIS — N6489 Other specified disorders of breast: Secondary | ICD-10-CM

## 2021-08-04 ENCOUNTER — Ambulatory Visit: Payer: Medicare Other

## 2021-08-12 ENCOUNTER — Telehealth: Payer: Self-pay | Admitting: Pharmacy Technician

## 2021-08-12 NOTE — Telephone Encounter (Signed)
Received notification from [facility] regarding a prior authorization for Montara. Authorization has been APPROVED from 08/12/21 to 08/27/22.    Authorization # [id number]

## 2021-08-12 NOTE — Telephone Encounter (Signed)
Submitted a Prior Authorization request to [facility] for HUMIRA via CoverMyMeds. Will update once we receive a response.   (Key: [id number]) - [id number]

## 2021-08-16 ENCOUNTER — Other Ambulatory Visit: Payer: Self-pay | Admitting: Physician Assistant

## 2021-08-16 ENCOUNTER — Other Ambulatory Visit (HOSPITAL_COMMUNITY): Payer: Self-pay

## 2021-08-16 MED ORDER — HUMIRA PEN 40 MG/0.8ML ~~LOC~~ PNKT
PEN_INJECTOR | SUBCUTANEOUS | 0 refills | Status: DC
Start: 2021-08-16 — End: 2021-11-16
  Filled 2021-08-23: qty 2, 28d supply, fill #0
  Filled 2021-09-19: qty 2, 28d supply, fill #1
  Filled 2021-10-13: qty 2, 28d supply, fill #2

## 2021-08-16 NOTE — Telephone Encounter (Signed)
Next Visit: 10/06/2021  Last Visit: 05/05/2021  Last Fill: 05/09/2021  DX: Rheumatoid arthritis of multiple sites with negative rheumatoid factor  Current Dose per office note 05/05/2021: Humira 40 mg subcutaneous injections every 14 days   Labs: 06/27/2021 Glucose is 130. Calcium is borderline low-8.6.  Alk phos remains elevated but has trended down.  We will continue to monitor.  Rest of CMP WNL.  CBC WNL   TB Gold: 11/03/2020 Neg   Okay to refill Humira?

## 2021-08-17 ENCOUNTER — Encounter: Payer: Self-pay | Admitting: Cardiology

## 2021-08-17 ENCOUNTER — Telehealth: Payer: Self-pay | Admitting: Cardiology

## 2021-08-17 NOTE — Telephone Encounter (Signed)
PCP's office [facility] called stating that patient's last office visit note needs to be corrected.  Where it saying in #1  She had occasional atrial and ventricular premature contractions no severe or sustained arrhythmia and I told her it is conceivable that her alcohol may play a role in her palpitation. The word alcohol needs to be replaced with adarone.

## 2021-08-23 ENCOUNTER — Other Ambulatory Visit (HOSPITAL_COMMUNITY): Payer: Self-pay

## 2021-09-07 ENCOUNTER — Ambulatory Visit (INDEPENDENT_AMBULATORY_CARE_PROVIDER_SITE_OTHER): Payer: Medicare Other | Admitting: *Deleted

## 2021-09-07 DIAGNOSIS — J4550 Severe persistent asthma, uncomplicated: Secondary | ICD-10-CM | POA: Diagnosis not present

## 2021-09-15 ENCOUNTER — Other Ambulatory Visit (HOSPITAL_COMMUNITY): Payer: Self-pay

## 2021-09-19 ENCOUNTER — Other Ambulatory Visit (HOSPITAL_COMMUNITY): Payer: Self-pay

## 2021-09-23 ENCOUNTER — Other Ambulatory Visit: Payer: Self-pay | Admitting: Physician Assistant

## 2021-09-23 NOTE — Progress Notes (Deleted)
Office Visit Note  Patient: Kimberly Jenkins             Date of Birth: [birth year]           MRN: [id number]             PCP: Darrol Jump, NP Referring: Darrol Jump, NP Visit Date: 10/06/2021 Occupation: @GUAROCC @  Subjective:  No chief complaint on file.   History of Present Illness: Kimberly Jenkins is a 55 y.o. female ***   Activities of Daily Living:  Patient reports morning stiffness for *** {minute/hour:19697}.   Patient {ACTIONS;DENIES/REPORTS:21021675::"Denies"} nocturnal pain.  Difficulty dressing/grooming: {ACTIONS;DENIES/REPORTS:21021675::"Denies"} Difficulty climbing stairs: {ACTIONS;DENIES/REPORTS:21021675::"Denies"} Difficulty getting out of chair: {ACTIONS;DENIES/REPORTS:21021675::"Denies"} Difficulty using hands for taps, buttons, cutlery, and/or writing: {ACTIONS;DENIES/REPORTS:21021675::"Denies"}  No Rheumatology ROS completed.   PMFS History:  Patient Active Problem List   Diagnosis Date Noted   Cancer of kidney ([place]) 07/11/2021   Dehydration 07/11/2021   Kidney disease 07/11/2021   Hypokalemia 05/11/2020   Costochondritis 05/10/2020   Edema 05/10/2020   Epistaxis 05/10/2020   Fibroma 05/10/2020   Hypercholesterolemia 05/10/2020   Iron deficiency anemia secondary to blood loss (chronic) 05/10/2020   Migraine, unspecified, not intractable, without status migrainosus 05/10/2020   Other chronic diseases of tonsils and adenoids 05/10/2020   Hypertrophy of tonsils 05/10/2020   Asthma 05/07/2020   Fluid overload 05/07/2020   Pain in right knee 08/28/2019   Carpal tunnel syndrome of right wrist 04/17/2019   Ulnar tunnel syndrome of right wrist 04/17/2019   Lesion of ulnar nerve, right upper limb 11/12/2018   Current mild episode of major depressive disorder without prior episode ([place]) 12/12/2017   Abnormal weight loss 11/15/2017   Fibromyalgia 08/27/2016   History of right rotator cuff tear repair 08/27/2016   Diabetes 08/27/2016    Chronic obstructive pulmonary disease ([place]) 08/27/2016   Dyslipidemia 08/27/2016   History of renal cell carcinoma 08/27/2016   Anxiety 08/27/2016   Sleep apnea 08/27/2016   Rheumatoid arthritis of multiple sites with negative rheumatoid factor  07/18/2016   High risk medication use 07/18/2016   Other specified personal risk factors, not elsewhere classified 07/18/2016   Hand pain 07/13/2016   Foot pain 07/13/2016   Elevated sed rate 07/13/2016   Open wound of toe 05/03/2016   Ataxic gait 05/02/2016   Risk for falls 05/02/2016   Tremor 05/02/2016   Vitamin D deficiency 07/08/2015   Chest pain, unspecified 06/23/2015   Mood disorder ([place]) 06/17/2015   Essential hypertension 05/18/2015   Generalized anxiety disorder 05/12/2015   Acquired hypothyroidism 05/05/2015   Severe persistent asthma 04/30/2015   Other allergic rhinitis 04/30/2015   GERD (gastroesophageal reflux disease) 04/30/2015   Chronic kidney disease, stage 2 (mild) 03/02/2015   Obesity 03/01/2015   Chronic post-traumatic headache 02/17/2010   Depression, unspecified 02/17/2010   History of malignant neoplasm of kidney 02/17/2010   Hyperlipidemia associated with type 2 diabetes mellitus ([place]) 02/17/2010   Other amnesia 02/17/2010   Polyosteoarthritis, unspecified 02/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 Overview:  Normal pharmacologic MPS Formatting of this note might be different from the original. Normal pharmacologic MPS Formatting of this note might be different from the original.  Formatting of this note might be different from the original. Normal pharmacologic MPS Formatting of this note might be different from the original. Form   Chronic obstructive pulmonary disease ([place]) 08/27/2016   Chronic post-traumatic headache 02/17/2010   Current mild episode of major depressive disorder without prior episode ([place]) 12/12/2017   Dehydration    Depression, unspecified 02/17/2010   Diabetes 08/27/2016   Dyslipidemia 08/27/2016   Edema 05/10/2020   Elevated sed rate 07/13/2016   48 on 05/2016   Epistaxis 05/10/2020   Essential hypertension 05/18/2015   Fibroma 05/10/2020   Fibromyalgia 08/27/2016   Fluid overload 05/07/2020   Foot pain 07/13/2016   Generalized anxiety disorder 05/12/2015   GERD (gastroesophageal reflux disease)    Hand pain 07/13/2016   High risk medication use 07/18/2016   History of malignant neoplasm of kidney 02/17/2010   Formatting of this note might be different from the original. Formatting of this note might be different from the original. Right partial nephrectomy 2008 Formatting of this note might be different from the original. Overview:  Right partial nephrectomy 2008   History of renal cell carcinoma 08/27/2016   Right partial nephrectomy 2008   Hypercholesterolemia 05/10/2020   Formatting of this note might be different from the original. Formatting of this note might be different from the original. 12/07/2014 - TC 151/Trig 199/HDL 47/LDL 64  Lp(a) 10 ( <30)   Hyperlipidemia associated with type 2 diabetes mellitus ([place]) 02/17/2010   Formatting of this note might be different from the original. Overview:  12/07/2014 - TC 151/Trig 199/HDL 47/LDL 64  Lp(a) 10 ( <30)  Overview:  12/07/2014 - TC 151/Trig 199/HDL 47/LDL 64  Lp(a) 10 ( <30) Formatting of this note might be different from the original. 12/07/2014 - TC 151/Trig 199/HDL 47/LDL 64  Lp(a) 10 ( <30)   Hypertrophy of tonsils 05/10/2020   Hypokalemia 05/11/2020   Hypothyroidism    Iron  deficiency anemia secondary to blood loss (chronic) 05/10/2020   Kidney disease    Migraine, unspecified, not intractable, without status migrainosus 05/10/2020   Mood disorder ([place]) 06/17/2015   Obesity 03/01/2015   Open wound of toe 05/03/2016   Other allergic rhinitis 04/30/2015   Other amnesia 02/17/2010   Other chronic diseases of tonsils and adenoids 05/10/2020   Other specified personal risk factors, not elsewhere classified 07/18/2016   Pain in right knee 08/28/2019   Rheumatoid arthritis ([place])    Risk for falls 05/02/2016   Severe persistent asthma 04/30/2015   Sleep apnea 08/27/2016   On CPAP, 2 L oxygen   Sleep disturbance 02/17/2010   Spinal stenosis in cervical region 02/17/2010   Tremor    Vitamin D deficiency 07/08/2015    Family History  Problem Relation Age of Onset   Brain cancer Brother    Asthma Mother    Lung cancer Mother    Asthma Father    Allergic rhinitis Father    COPD Father    Heart attack Father    Arthritis Father    Brain cancer Paternal Uncle    Asthma Maternal Grandmother    Brain cancer Paternal Grandmother    Heart attack Brother    Past Surgical History:  Procedure Laterality Date   APPENDECTOMY  1983   CESAREAN SECTION     2 times, 1986 &  1983   CHOLECYSTECTOMY  2009   COLONOSCOPY W/ POLYPECTOMY  2021   ELBOW SURGERY Right    ulner nerve release   ESOPHAGEAL DILATION  2021   KIDNEY SURGERY Right 2008   1/2 right kidney removed   pellet insertion  03/2021   per patient   SHOULDER SURGERY Right 2006   TONSILLECTOMY  2011   TOTAL SHOULDER ARTHROPLASTY     TUBAL LIGATION  1986   WRIST SURGERY Right    ulner nerve release   Social History   Social History Narrative   Not on file   Immunization History  Administered Date(s) Administered   PFIZER(Purple Top)SARS-COV-2 Vaccination 11/18/2019, 12/09/2019, 08/05/2020     Objective: Vital Signs: There were no vitals taken for this visit.   Physical Exam    Musculoskeletal Exam: ***  CDAI Exam: CDAI Score: -- Patient Global: --; Provider Global: -- Swollen: --; Tender: -- Joint Exam 10/06/2021   No joint exam has been documented for this visit   There is currently no information documented on the homunculus. Go to the Rheumatology activity and complete the homunculus joint exam.  Investigation: No additional findings.  Imaging: No results found.  Recent Labs: Lab Results  Component Value Date   WBC 6.7 06/27/2021   HGB 12.5 06/27/2021   PLT 242 06/27/2021   NA 136 06/27/2021   K 3.8 06/27/2021   CL 102 06/27/2021   CO2 21 06/27/2021   GLUCOSE 130 (H) 06/27/2021   BUN 9 06/27/2021   CREATININE 0.94 06/27/2021   BILITOT 0.3 06/27/2021   ALKPHOS 135 (H) 06/27/2021   AST 22 06/27/2021   ALT 20 06/27/2021   PROT 6.6 06/27/2021   ALBUMIN 4.1 06/27/2021   CALCIUM 8.6 (L) 06/27/2021   GFRAA 79 11/03/2020   QFTBGOLD NEGATIVE 06/26/2017   QFTBGOLDPLUS NEGATIVE 11/03/2020    Speciality Comments: No specialty comments available.  Procedures:  No procedures performed Allergies: Methotrexate, Advair diskus [fluticasone-salmeterol], Aspirin, Ciprofloxacin, Clonidine derivatives, Nystatin, Oxycodone, Serevent [salmeterol], and Cephalexin   Assessment / Plan:     Visit Diagnoses: Rheumatoid arthritis of multiple sites with negative rheumatoid factor   High risk medication use  Fibromyalgia  History of rotator cuff surgery  Trochanteric bursitis, left hip  Trochanteric bursitis, right hip  Primary insomnia  Positive ANA (antinuclear antibody)  Frequent falls  Osteopenia of multiple sites  Dyslipidemia  Chronic obstructive pulmonary disease, unspecified COPD type ([place])  History of diabetes mellitus  History of gastroesophageal reflux (GERD)  History of hypothyroidism  History of renal cell carcinoma  Orders: No orders of the defined types were placed in this encounter.  No orders of the defined types  were placed in this encounter.   Face-to-face time spent with patient was *** minutes. Greater than 50% of time was spent in counseling and coordination of care.  Follow-Up Instructions: No follow-ups on file.   Ofilia Neas, PA-C  Note - This record has been created using Dragon software.  Chart creation errors have been sought, but may not always  have been located. Such creation errors do not reflect on  the standard of medical care.

## 2021-09-23 NOTE — Telephone Encounter (Signed)
Next Visit: 10/06/2021   Last Visit: 05/05/2021   Last Fill: 06/27/2021   [id number] arthritis of multiple sites with negative rheumatoid factor   Current Dose per office note 05/05/2021: Arava 20 mg 1 tablet by mouth daily   Labs: 06/27/2021 Glucose is 130. Calcium is borderline low-8.6.  Marland Kitchen  Alk phos remains elevated but has trended down.  We will continue to monitor.  Rest of CMP WNL.  CBC WNL   Patient to update labs at upcoming appointment on 10/06/2021.    Okay to refill Arava?

## 2021-09-26 ENCOUNTER — Other Ambulatory Visit (HOSPITAL_COMMUNITY): Payer: Self-pay

## 2021-09-28 NOTE — Progress Notes (Signed)
Office Visit Note  Patient: Kimberly Jenkins             Date of Birth: [birth year]           MRN: [id number]             PCP: Darrol Jump, NP Referring: Darrol Jump, NP Visit Date: 09/29/2021 Occupation: @GUAROCC @  Subjective:  Medication monitoring   History of Present Illness: Kimberly Jenkins is a 56 y.o. female with history of seronegative rheumatoid arthritis and fibromyalgia.  She is on Humira 40 mg subcutaneous injections every 14 days and Arava 20 mg daily.  She is tolerating these medications without any side effects and has not missed any doses recently.  She denies any signs or symptoms of a rheumatoid arthritis flare.  She states that she has noticed some increased hand cramping especially in her right hand.  She states that the cramping is most frequent while using her pen stylus.  She denies any joint swelling at this time.  She states that she has had some increased myalgias and stiffness with weather changes which she attributes to fibromyalgia.  She states overall her energy level has been improving and she has been sleeping better at night.  She has been walking 1 mile daily for exercise.  She states that she has also been working on weight loss and her hemoglobin A1c has improved.  She states that she was taken off of metformin.  She denies any new medical conditions since her last office visit.  She denies any recent infections.     Activities of Daily Living:  Patient reports morning stiffness for 5-10 minutes.   Patient Denies nocturnal pain.  Difficulty dressing/grooming: Denies Difficulty climbing stairs: Reports Difficulty getting out of chair: Denies Difficulty using hands for taps, buttons, cutlery, and/or writing: Denies  Review of Systems  Constitutional:  Positive for fatigue.  HENT:  Positive for mouth dryness. Negative for mouth sores and nose dryness.   Eyes:  Negative for pain, itching and dryness.  Respiratory:  Negative for shortness  of breath and difficulty breathing.   Cardiovascular:  Negative for chest pain and palpitations.  Gastrointestinal:  Negative for blood in stool, constipation and diarrhea.  Endocrine: Negative for increased urination.  Genitourinary:  Negative for difficulty urinating and painful urination.  Musculoskeletal:  Positive for myalgias, morning stiffness, muscle tenderness and myalgias. Negative for joint pain, joint pain and joint swelling.  Skin:  Negative for color change, rash and redness.  Allergic/Immunologic: Positive for susceptible to infections.  Neurological:  Positive for memory loss. Negative for dizziness, numbness, headaches and weakness.  Hematological:  Positive for bruising/bleeding tendency.  Psychiatric/Behavioral:  Negative for confusion.    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/10/2020   Migraine, unspecified, not intractable, without status migrainosus 05/10/2020   Other chronic diseases of tonsils and adenoids 05/10/2020   Hypertrophy of tonsils 05/10/2020   Asthma 05/07/2020   Fluid overload 05/07/2020   Pain in right knee 08/28/2019   Carpal tunnel syndrome of right wrist 04/17/2019   Ulnar tunnel syndrome of right wrist 04/17/2019   Lesion of ulnar nerve, right upper limb 11/12/2018  Current mild episode of major depressive disorder without prior episode ([place]) 12/12/2017   Abnormal weight loss 11/15/2017   Fibromyalgia 08/27/2016   History of right rotator cuff tear repair 08/27/2016   Diabetes 08/27/2016   Chronic obstructive pulmonary disease ([place]) 08/27/2016   Dyslipidemia 08/27/2016   History of renal cell carcinoma  08/27/2016   Anxiety 08/27/2016   Sleep apnea 08/27/2016   Rheumatoid arthritis of multiple sites with negative rheumatoid factor  07/18/2016   High risk medication use 07/18/2016   Other specified personal risk factors, not elsewhere classified 07/18/2016   Hand pain 07/13/2016   Foot pain 07/13/2016   Elevated sed rate 07/13/2016   Open wound of toe 05/03/2016   Ataxic gait 05/02/2016   Risk for falls 05/02/2016   Tremor 05/02/2016   Vitamin D deficiency 07/08/2015   Chest pain, unspecified 06/23/2015   Mood disorder ([place]) 06/17/2015   Essential hypertension 05/18/2015   Generalized anxiety disorder 05/12/2015   Acquired hypothyroidism 05/05/2015   Severe persistent asthma 04/30/2015   Other allergic rhinitis 04/30/2015   GERD (gastroesophageal reflux disease) 04/30/2015   Chronic kidney disease, stage 2 (mild) 03/02/2015   Obesity 03/01/2015   Chronic post-traumatic headache 02/17/2010   Depression, unspecified 02/17/2010   History of malignant neoplasm of kidney 02/17/2010   Hyperlipidemia associated with type 2 diabetes mellitus ([place]) 02/17/2010   Other amnesia 02/17/2010   Polyosteoarthritis, 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 Overview:  Normal pharmacologic MPS Formatting of this note might be different from the original. Normal pharmacologic MPS Formatting of this note might be different from the original. Formatting of this note might be different from the original. Normal pharmacologic MPS Formatting of this note might  be different from the original. Form   Chronic obstructive pulmonary disease ([place]) 08/27/2016   Chronic post-traumatic headache 02/17/2010   Current mild episode of major depressive disorder without prior episode ([place]) 12/12/2017   Dehydration    Depression, unspecified 02/17/2010   Diabetes 08/27/2016   Dyslipidemia 08/27/2016   Edema 05/10/2020   Elevated sed rate 07/13/2016   48 on 05/2016   Epistaxis 05/10/2020   Essential hypertension 05/18/2015   Fibroma 05/10/2020   Fibromyalgia 08/27/2016   Fluid overload 05/07/2020   Foot pain 07/13/2016   Generalized anxiety disorder 05/12/2015   GERD (gastroesophageal reflux disease)    Hand pain 07/13/2016   High risk medication use 07/18/2016   History of malignant neoplasm of kidney 02/17/2010   Formatting of this note might be different from the original. Formatting of this note might be different from the original. Right partial nephrectomy 2008 Formatting of this note might be different from the original. Overview:  Right partial nephrectomy 2008   History of renal cell carcinoma 08/27/2016   Right partial nephrectomy 2008   Hypercholesterolemia 05/10/2020   Formatting of this note might be different from the original. Formatting of this note might be different from the original. 12/07/2014 - TC 151/Trig 199/HDL 47/LDL 64  Lp(a) 10 ( <30)  Hyperlipidemia associated with type 2 diabetes mellitus ([place]) 02/17/2010   Formatting of this note might be different from the original. Overview:  12/07/2014 - TC 151/Trig 199/HDL 47/LDL 64  Lp(a) 10 ( <30)  Overview:  12/07/2014 - TC 151/Trig 199/HDL 47/LDL 64  Lp(a) 10 ( <30) Formatting of this note might be different from the original. 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   Kidney disease    Migraine, unspecified, not  intractable, without status migrainosus 05/10/2020   Mood disorder ([place]) 06/17/2015   Obesity 03/01/2015   Open wound of toe 05/03/2016   Other allergic rhinitis 04/30/2015   Other amnesia 02/17/2010   Other chronic diseases of tonsils and adenoids 05/10/2020   Other specified personal risk factors, not elsewhere classified 07/18/2016   Pain in right knee 08/28/2019   Rheumatoid arthritis ([place])    Risk for falls 05/02/2016   Severe persistent asthma 04/30/2015   Sleep apnea 08/27/2016   On CPAP, 2 L oxygen   Sleep disturbance 02/17/2010   Spinal stenosis in cervical region 02/17/2010   Tremor    Vitamin D deficiency 07/08/2015    Family History  Problem Relation Age of Onset   Brain cancer Brother    Asthma Mother    Lung cancer Mother    Asthma Father    Allergic rhinitis Father    COPD Father    Heart attack Father    Arthritis Father    Brain cancer Paternal Uncle    Asthma Maternal Grandmother    Brain cancer Paternal Grandmother    Heart attack Brother    Past Surgical History:  Procedure Laterality Date   [place]     2 times, [place]  2009   COLONOSCOPY W/ POLYPECTOMY  2021   ELBOW SURGERY Right    ulner nerve release   ESOPHAGEAL DILATION  2021   KIDNEY SURGERY Right 2008   1/2 right kidney removed   pellet insertion  03/2021   per patient   SHOULDER SURGERY Right 2006   TONSILLECTOMY  2011   TOTAL SHOULDER ARTHROPLASTY     TUBAL LIGATION  1986   WRIST SURGERY Right    ulner nerve release   Social History   Social History Narrative   Not on file   Immunization History  Administered Date(s) Administered   PFIZER(Purple Top)SARS-COV-2 Vaccination 11/18/2019, 12/09/2019, 08/05/2020     Objective: Vital Signs: BP 105/69 (BP Location: Left Arm, Patient Position: Sitting, Cuff Size: Normal)    Pulse 69    Ht 5\' 1"  (1.549 m)    Wt 202 lb (91.6 kg)    BMI 38.17 kg/m    Physical Exam Vitals and  nursing note reviewed.  Constitutional:      Appearance: She is well-developed.  HENT:     Head: Normocephalic and atraumatic.  Eyes:     Conjunctiva/sclera: Conjunctivae normal.  Cardiovascular:     Rate and Rhythm: Normal rate and regular rhythm.     Heart sounds: Normal heart sounds.  Pulmonary:     Effort: Pulmonary effort is normal.     Breath sounds: Normal breath sounds.  Abdominal:     General: Bowel sounds are normal.     Palpations: Abdomen is soft.  Musculoskeletal:  Cervical back: Normal range of motion.  Lymphadenopathy:     Cervical: No cervical adenopathy.  Skin:    General: Skin is warm and dry.     Capillary Refill: Capillary refill takes less than 2 seconds.  Neurological:     Mental Status: She is alert and oriented to person, place, and time.  Psychiatric:        Behavior: Behavior normal.     Musculoskeletal Exam: C-spine, thoracic spine, and lumbar spine have good ROM.  Some midline spinal tenderness in the lumbar region.  Shoulder joints, elbow joints, wrist joints, MCPs, PIPs, and DIPs have good ROM with no synovitis.  Complete fist formation bilaterally.  Hip joints, knee joints, and ankle joints have good ROM with no discomfort.  No warmth or effusion of knee joints.  No tenderness or swelling of ankle joints.   CDAI Exam: CDAI Score: 0.4  Patient Global: 2 mm; Provider Global: 2 mm Swollen: 0 ; Tender: 0  Joint Exam 09/29/2021   No joint exam has been documented for this visit   There is currently no information documented on the homunculus. Go to the Rheumatology activity and complete the homunculus joint exam.  Investigation: No additional findings.  Imaging: No results found.  Recent Labs: Lab Results  Component Value Date   WBC 6.7 06/27/2021   HGB 12.5 06/27/2021   PLT 242 06/27/2021   NA 136 06/27/2021   K 3.8 06/27/2021   CL 102 06/27/2021   CO2 21 06/27/2021   GLUCOSE 130 (H) 06/27/2021   BUN 9 06/27/2021   CREATININE  0.94 06/27/2021   BILITOT 0.3 06/27/2021   ALKPHOS 135 (H) 06/27/2021   AST 22 06/27/2021   ALT 20 06/27/2021   PROT 6.6 06/27/2021   ALBUMIN 4.1 06/27/2021   CALCIUM 8.6 (L) 06/27/2021   GFRAA 79 11/03/2020   QFTBGOLD NEGATIVE 06/26/2017   QFTBGOLDPLUS NEGATIVE 11/03/2020    Speciality Comments: No specialty comments available.  Procedures:  No procedures performed Allergies: Methotrexate, Advair diskus [fluticasone-salmeterol], Aspirin, Ciprofloxacin, Clonidine derivatives, Nystatin, Oxycodone, Serevent [salmeterol], and Cephalexin   Assessment / Plan:     Visit Diagnoses: Rheumatoid arthritis of multiple sites with negative rheumatoid factor: She has no joint tenderness or synovitis on examination today.  She has not had any signs or symptoms of a rheumatoid arthritis flare.  She has clinically been doing well on Humira 40 mg subcutaneous injections every 14 days and Arava 20 mg 1 tablet by mouth daily.  She is tolerating both medications without any side effects and has not missed any doses recently.  She has not had any recent infections.  She has been experiencing some increased hand cramping especially in her right hand.  The hand cramping is more common after activities that require fine motor skills.  Discussed the importance of joint protection and muscle strengthening.  She was given a handout of hand exercises to perform. She will remain on combination therapy as prescribed.  She was advised to notify us if she develops increased joint pain or joint swelling.  She will follow-up in the office in 5 months.  High risk medication use - Humira 40 mg subcutaneous injections every 14 days (11/02/2016), Arava 20 mg by mouth daily (12/13/2016) (inadequate response to Enbrel, side effects from MTX).  CBC and CMP updated on 06/27/2021.  She is overdue to update lab work today.  Orders for CBC and CMP were released.  Her next lab work will be due in May and every  3 months to monitor for drug  toxicity.  Standing orders for CBC and CMP remain in place.  TB Gold negative on 11/03/2020.  Future order for TB gold will be placed today.- Plan: CBC with Differential/Platelet, COMPLETE METABOLIC PANEL WITH GFR, QuantiFERON-TB Gold Plus Discussed the importance of holding Humira and Arava if she develops signs or symptoms of an infection and to resume once the infection has completely cleared.  Screening for tuberculosis - TB gold order released today. Plan: QuantiFERON-TB Gold Plus  Fibromyalgia: She experiences intermittent myalgias and muscle tenderness due to fibromyalgia.  She has been experiencing some increased joint stiffness and aching which she attributes to colder weather temperatures.  Overall her energy level has improved and she has been sleeping better at night.  Discussed the importance of regular exercise and good sleep hygiene.  Trochanteric bursitis, left hip: Improved.  She has some tenderness to palpation over the left trochanteric bursa.  The left hip joint has good range of motion with no groin pain currently.  Trochanteric bursitis, right hip: Improved.   Primary insomnia: She has been sleeping better at night.  Discussed the importance of good sleep hygiene.  Positive ANA (antinuclear antibody): No clinical features of systemic lupus at this time.  Osteopenia of multiple sites - DEXA updated on 12/12/2019: AP spine BMD 0.842 with T score -1.9.  Discussed the importance of taking calcium, vitamin D, and performing resistive exercises.  Order for DXA placed today.   - Plan: DG BONE DENSITY (DXA)  Other medical conditions are listed as follows:   History of gastroesophageal reflux (GERD)  Chronic obstructive pulmonary disease, unspecified COPD type ([place])  Dyslipidemia  History of renal cell carcinoma  History of hypothyroidism  History of diabetes mellitus  Postmenopausal    Orders: Orders Placed This Encounter  Procedures   DG BONE DENSITY (DXA)   CBC  with Differential/Platelet   COMPLETE METABOLIC PANEL WITH GFR   QuantiFERON-TB Gold Plus   No orders of the defined types were placed in this encounter.   Follow-Up Instructions: Return in about 5 months (around 02/26/2022) for Rheumatoid arthritis, Fibromyalgia.   Ofilia Neas, PA-C  Note - This record has been created using Dragon software.  Chart creation errors have been sought, but may not always  have been located. Such creation errors do not reflect on  the standard of medical care.

## 2021-09-29 ENCOUNTER — Other Ambulatory Visit: Payer: Self-pay

## 2021-09-29 ENCOUNTER — Encounter: Payer: Self-pay | Admitting: Physician Assistant

## 2021-09-29 ENCOUNTER — Ambulatory Visit (INDEPENDENT_AMBULATORY_CARE_PROVIDER_SITE_OTHER): Payer: Medicare Other | Admitting: Physician Assistant

## 2021-09-29 VITALS — BP 105/69 | HR 69 | Ht 61.0 in | Wt 202.0 lb

## 2021-09-29 DIAGNOSIS — Z8719 Personal history of other diseases of the digestive system: Secondary | ICD-10-CM

## 2021-09-29 DIAGNOSIS — F5101 Primary insomnia: Secondary | ICD-10-CM

## 2021-09-29 DIAGNOSIS — R7689 Other specified abnormal immunological findings in serum: Secondary | ICD-10-CM

## 2021-09-29 DIAGNOSIS — M0609 Rheumatoid arthritis without rheumatoid factor, multiple sites: Secondary | ICD-10-CM | POA: Diagnosis not present

## 2021-09-29 DIAGNOSIS — M797 Fibromyalgia: Secondary | ICD-10-CM | POA: Diagnosis not present

## 2021-09-29 DIAGNOSIS — Z79899 Other long term (current) drug therapy: Secondary | ICD-10-CM | POA: Diagnosis not present

## 2021-09-29 DIAGNOSIS — Z111 Encounter for screening for respiratory tuberculosis: Secondary | ICD-10-CM

## 2021-09-29 DIAGNOSIS — M8589 Other specified disorders of bone density and structure, multiple sites: Secondary | ICD-10-CM

## 2021-09-29 DIAGNOSIS — M7061 Trochanteric bursitis, right hip: Secondary | ICD-10-CM

## 2021-09-29 DIAGNOSIS — Z85528 Personal history of other malignant neoplasm of kidney: Secondary | ICD-10-CM

## 2021-09-29 DIAGNOSIS — Z8639 Personal history of other endocrine, nutritional and metabolic disease: Secondary | ICD-10-CM

## 2021-09-29 DIAGNOSIS — E785 Hyperlipidemia, unspecified: Secondary | ICD-10-CM

## 2021-09-29 DIAGNOSIS — J449 Chronic obstructive pulmonary disease, unspecified: Secondary | ICD-10-CM

## 2021-09-29 DIAGNOSIS — Z780 Asymptomatic menopausal state: Secondary | ICD-10-CM

## 2021-09-29 DIAGNOSIS — M7062 Trochanteric bursitis, left hip: Secondary | ICD-10-CM | POA: Diagnosis not present

## 2021-09-29 DIAGNOSIS — R768 Other specified abnormal immunological findings in serum: Secondary | ICD-10-CM

## 2021-09-29 NOTE — Patient Instructions (Addendum)
Standing Labs We placed an order today for your standing lab work.   Please have your standing labs drawn in May and every 3 months   If possible, please have your labs drawn 2 weeks prior to your appointment so that the provider can discuss your results at your appointment.  Please note that you may see your imaging and lab results in [facility] before we have reviewed them. We may be awaiting multiple results to interpret others before contacting you. Please allow our office up to 72 hours to thoroughly review all of the results before contacting the office for clarification of your results.  We have open lab daily: Monday through Thursday from 1:30-4:30 PM and Friday from 1:30-4:00 PM at the office of Dr. Bo Merino, [facility].   Please be advised, all patients with office appointments requiring lab work will take precedent over walk-in lab work.  If possible, please come for your lab work on Monday and Friday afternoons, as you may experience shorter wait times. The office is located at [address], [place], [place], [place] [address] No appointment is necessary.   Labs are drawn by [facility]. Please bring your co-pay at the time of your lab draw.  You may receive a bill from [facility] for your lab work.  Please note if you are on Hydroxychloroquine and and an order has been placed for a Hydroxychloroquine level, you will need to have it drawn 4 hours or more after your last dose.  If you wish to have your labs drawn at another location, please call the office 24 hours in advance to send orders.  If you have any questions regarding directions or hours of operation,  please call [phone].   As a reminder, please drink plenty of water prior to coming for your lab work. Thanks!   Hand Exercises Hand exercises can be helpful for almost anyone. These exercises can strengthen the hands, improve flexibility and movement, and increase blood flow to the hands. These  results can make work and daily tasks easier. Hand exercises can be especially helpful for people who have joint pain from arthritis or have nerve damage from overuse (carpal tunnel syndrome). These exercises can also help people who have injured a hand. Exercises Most of these hand exercises are gentle stretching and motion exercises. It is usually safe to do them often throughout the day. Warming up your hands before exercise may help to reduce stiffness. You can do this with gentle massage or by placing your hands in warm water for 10-15 minutes. It is normal to feel some stretching, pulling, tightness, or mild discomfort as you begin new exercises. This will gradually improve. Stop an exercise right away if you feel sudden, severe pain or your pain gets worse. Ask your health care provider which exercises are best for you. Knuckle bend or "claw" fist  Stand or sit with your arm, hand, and all five fingers pointed straight up. Make sure to keep your wrist straight during the exercise. Gently bend your fingers down toward your palm until the tips of your fingers are touching the top of your palm. Keep your big knuckle straight and just bend the small knuckles in your fingers. Hold this position for __________ seconds. Straighten (extend) your fingers back to the starting position. Repeat this exercise 5-10 times with each hand. Full finger fist  Stand or sit with your arm, hand, and all five fingers pointed straight up. Make sure to keep your wrist straight during  the exercise. Gently bend your fingers into your palm until the tips of your fingers are touching the middle of your palm. Hold this position for __________ seconds. Extend your fingers back to the starting position, stretching every joint fully. Repeat this exercise 5-10 times with each hand. Straight fist Stand or sit with your arm, hand, and all five fingers pointed straight up. Make sure to keep your wrist straight during the  exercise. Gently bend your fingers at the big knuckle, where your fingers meet your hand, and the middle knuckle. Keep the knuckle at the tips of your fingers straight and try to touch the bottom of your palm. Hold this position for __________ seconds. Extend your fingers back to the starting position, stretching every joint fully. Repeat this exercise 5-10 times with each hand. Tabletop  Stand or sit with your arm, hand, and all five fingers pointed straight up. Make sure to keep your wrist straight during the exercise. Gently bend your fingers at the big knuckle, where your fingers meet your hand, as far down as you can while keeping the small knuckles in your fingers straight. Think of forming a tabletop with your fingers. Hold this position for __________ seconds. Extend your fingers back to the starting position, stretching every joint fully. Repeat this exercise 5-10 times with each hand. Finger spread  Place your hand flat on a table with your palm facing down. Make sure your wrist stays straight as you do this exercise. Spread your fingers and thumb apart from each other as far as you can until you feel a gentle stretch. Hold this position for __________ seconds. Bring your fingers and thumb tight together again. Hold this position for __________ seconds. Repeat this exercise 5-10 times with each hand. Making circles  Stand or sit with your arm, hand, and all five fingers pointed straight up. Make sure to keep your wrist straight during the exercise. Make a circle by touching the tip of your thumb to the tip of your index finger. Hold for __________ seconds. Then open your hand wide. Repeat this motion with your thumb and each finger on your hand. Repeat this exercise 5-10 times with each hand. Thumb motion  Sit with your forearm resting on a table and your wrist straight. Your thumb should be facing up toward the ceiling. Keep your fingers relaxed as you move your thumb. Lift  your thumb up as high as you can toward the ceiling. Hold for __________ seconds. Bend your thumb across your palm as far as you can, reaching the tip of your thumb for the small finger (pinkie) side of your palm. Hold for __________ seconds. Repeat this exercise 5-10 times with each hand. Grip strengthening  Hold a stress ball or other soft ball in the middle of your hand. Slowly increase the pressure, squeezing the ball as much as you can without causing pain. Think of bringing the tips of your fingers into the middle of your palm. All of your finger joints should bend when doing this exercise. Hold your squeeze for __________ seconds, then relax. Repeat this exercise 5-10 times with each hand. Contact a health care provider if: Your hand pain or discomfort gets much worse when you do an exercise. Your hand pain or discomfort does not improve within 2 hours after you exercise. If you have any of these problems, stop doing these exercises right away. Do not do them again unless your health care provider says that you can. Get help right away if:  You develop sudden, severe hand pain or swelling. If this happens, stop doing these exercises right away. Do not do them again unless your health care provider says that you can. This information is not intended to replace advice given to you by your health care provider. Make sure you discuss any questions you have with your health care provider. Document Revised: 12/02/2020 Document Reviewed: 12/02/2020 Elsevier Patient Education  [place].

## 2021-09-30 NOTE — Progress Notes (Signed)
CBC WNL.  Creatinine is slightly elevated-1.12 and GFR is borderline low-57. Glucose is elevated-143.  Rest of CMP WNL.  This could be related the used of lasix. Please clarify if she has been taking any NSAIDs?   She should avoid the use of NSAIDs.

## 2021-10-04 LAB — COMPLETE METABOLIC PANEL WITH GFR
AG Ratio: 1.5 (calc) (ref 1.0–2.5)
ALT: 11 U/L (ref 6–29)
AST: 14 U/L (ref 10–35)
Albumin: 4.1 g/dL (ref 3.6–5.1)
Alkaline phosphatase (APISO): 96 U/L (ref 37–153)
BUN/Creatinine Ratio: 8 (calc) (ref 6–22)
BUN: 9 mg/dL (ref 7–25)
CO2: 24 mmol/L (ref 20–32)
Calcium: 8.8 mg/dL (ref 8.6–10.4)
Chloride: 107 mmol/L (ref 98–110)
Creat: 1.12 mg/dL — ABNORMAL HIGH (ref 0.50–1.03)
Globulin: 2.8 g/dL (calc) (ref 1.9–3.7)
Glucose, Bld: 143 mg/dL — ABNORMAL HIGH (ref 65–99)
Potassium: 4.1 mmol/L (ref 3.5–5.3)
Sodium: 139 mmol/L (ref 135–146)
Total Bilirubin: 0.3 mg/dL (ref 0.2–1.2)
Total Protein: 6.9 g/dL (ref 6.1–8.1)
eGFR: 57 mL/min/{1.73_m2} — ABNORMAL LOW (ref >=60)

## 2021-10-04 LAB — QUANTIFERON-TB GOLD PLUS
Mitogen-NIL: >10 IU/mL
NIL: 0.04 IU/mL
QuantiFERON-TB Gold Plus: NEGATIVE
TB1-NIL: 0.01 IU/mL
TB2-NIL: 0.01 IU/mL

## 2021-10-04 LAB — CBC WITH DIFFERENTIAL/PLATELET
Absolute Monocytes: 825 cells/uL (ref 200–950)
Basophils Absolute: 59 cells/uL (ref 0–200)
Basophils Relative: 0.9 %
Eosinophils Absolute: 92 cells/uL (ref 15–500)
Eosinophils Relative: 1.4 %
HCT: 39.9 % (ref 35.0–45.0)
Hemoglobin: 13.1 g/dL (ref 11.7–15.5)
Lymphs Abs: 2402 cells/uL (ref 850–3900)
MCH: 28.5 pg (ref 27.0–33.0)
MCHC: 32.8 g/dL (ref 32.0–36.0)
MCV: 86.7 fL (ref 80.0–100.0)
MPV: 10.7 fL (ref 7.5–12.5)
Monocytes Relative: 12.5 %
Neutro Abs: 3221 cells/uL (ref 1500–7800)
Neutrophils Relative %: 48.8 %
Platelets: 251 10*3/uL (ref 140–400)
RBC: 4.6 10*6/uL (ref 3.80–5.10)
RDW: 14.2 % (ref 11.0–15.0)
Total Lymphocyte: 36.4 %
WBC: 6.6 10*3/uL (ref 3.8–10.8)

## 2021-10-04 NOTE — Progress Notes (Signed)
TB gold negative

## 2021-10-05 ENCOUNTER — Ambulatory Visit: Payer: Medicare Other

## 2021-10-06 ENCOUNTER — Ambulatory Visit: Payer: Medicare Other | Admitting: Rheumatology

## 2021-10-06 DIAGNOSIS — M0609 Rheumatoid arthritis without rheumatoid factor, multiple sites: Secondary | ICD-10-CM

## 2021-10-06 DIAGNOSIS — Z8719 Personal history of other diseases of the digestive system: Secondary | ICD-10-CM

## 2021-10-06 DIAGNOSIS — J449 Chronic obstructive pulmonary disease, unspecified: Secondary | ICD-10-CM

## 2021-10-06 DIAGNOSIS — E785 Hyperlipidemia, unspecified: Secondary | ICD-10-CM

## 2021-10-06 DIAGNOSIS — M8589 Other specified disorders of bone density and structure, multiple sites: Secondary | ICD-10-CM

## 2021-10-06 DIAGNOSIS — F5101 Primary insomnia: Secondary | ICD-10-CM

## 2021-10-06 DIAGNOSIS — M7062 Trochanteric bursitis, left hip: Secondary | ICD-10-CM

## 2021-10-06 DIAGNOSIS — R768 Other specified abnormal immunological findings in serum: Secondary | ICD-10-CM

## 2021-10-06 DIAGNOSIS — Z85528 Personal history of other malignant neoplasm of kidney: Secondary | ICD-10-CM

## 2021-10-06 DIAGNOSIS — Z8639 Personal history of other endocrine, nutritional and metabolic disease: Secondary | ICD-10-CM

## 2021-10-06 DIAGNOSIS — Z9889 Other specified postprocedural states: Secondary | ICD-10-CM

## 2021-10-06 DIAGNOSIS — R296 Repeated falls: Secondary | ICD-10-CM

## 2021-10-06 DIAGNOSIS — M797 Fibromyalgia: Secondary | ICD-10-CM

## 2021-10-06 DIAGNOSIS — M7061 Trochanteric bursitis, right hip: Secondary | ICD-10-CM

## 2021-10-06 DIAGNOSIS — Z79899 Other long term (current) drug therapy: Secondary | ICD-10-CM

## 2021-10-09 ENCOUNTER — Other Ambulatory Visit: Payer: Self-pay | Admitting: Allergy and Immunology

## 2021-10-12 ENCOUNTER — Ambulatory Visit (INDEPENDENT_AMBULATORY_CARE_PROVIDER_SITE_OTHER): Payer: Medicare Other | Admitting: *Deleted

## 2021-10-12 ENCOUNTER — Other Ambulatory Visit: Payer: Self-pay

## 2021-10-12 DIAGNOSIS — J4550 Severe persistent asthma, uncomplicated: Secondary | ICD-10-CM | POA: Diagnosis not present

## 2021-10-13 ENCOUNTER — Other Ambulatory Visit (HOSPITAL_COMMUNITY): Payer: Self-pay

## 2021-10-17 ENCOUNTER — Ambulatory Visit (INDEPENDENT_AMBULATORY_CARE_PROVIDER_SITE_OTHER): Payer: Medicare Other | Admitting: Allergy and Immunology

## 2021-10-17 ENCOUNTER — Encounter: Payer: Self-pay | Admitting: Allergy and Immunology

## 2021-10-17 ENCOUNTER — Other Ambulatory Visit: Payer: Self-pay

## 2021-10-17 VITALS — BP 138/66 | HR 84 | Resp 12 | Ht 62.0 in | Wt 194.4 lb

## 2021-10-17 DIAGNOSIS — J3089 Other allergic rhinitis: Secondary | ICD-10-CM

## 2021-10-17 DIAGNOSIS — D849 Immunodeficiency, unspecified: Secondary | ICD-10-CM

## 2021-10-17 DIAGNOSIS — K219 Gastro-esophageal reflux disease without esophagitis: Secondary | ICD-10-CM | POA: Diagnosis not present

## 2021-10-17 DIAGNOSIS — J4550 Severe persistent asthma, uncomplicated: Secondary | ICD-10-CM | POA: Diagnosis not present

## 2021-10-17 DIAGNOSIS — B370 Candidal stomatitis: Secondary | ICD-10-CM

## 2021-10-17 MED ORDER — NYSTATIN 100000 UNIT/ML MT SUSP: OROMUCOSAL | 5 refills | Status: AC

## 2021-10-17 MED ORDER — OLOPATADINE HCL 0.1 % OP SOLN: OPHTHALMIC | 5 refills | Status: DC

## 2021-10-17 NOTE — Patient Instructions (Addendum)
°  1. Continue Breo 200 - 1 inhalation +  Spiriva 1.25 Respimat - 2 inhalations daily   2. Continue Mepolizumab injections every 4 weeks   3. Continue Montelukast 10 mg - 1 tablet one time per day  4. Continue Flonase - 1-2 sprays each nostril 1 time per day  5. Continue Omeprazole 40 mg - 1 tablet two times per day + Pepcid 20 mg in evening  6. Continue Nystatin oral solution - 5 mls swish and spit after BREO / Spiriva use  7. Use Duoneb nebulization or Albuterol HFA 2 puffs every 4-6 hours and Zyrtec if needed.  8. Return to clinic in 6 months or earlier if problem

## 2021-10-17 NOTE — Progress Notes (Signed)
[place]   Follow-up Note  Referring Provider: Darrol Jump, NP Primary Provider: Darrol Jump, NP Date of Office Visit: 10/17/2021  Subjective:   Kimberly Jenkins (DOB: 1965-11-29) is a 56 y.o. female who returns to the [facility] on 10/17/2021 in re-evaluation of the following:  HPI: [name] returns to this clinic in evaluation of severe eosinophilic asthma, allergic rhinitis, LPR, and thrush/angular cheilitis in the context of immunosuppression with adalimumab for rheumatoid arthritis.  Her last visit to this clinic was 20 April 2021.  She has really done well with her airway and has not required a systemic steroid or an antibiotic for any type of airway issue and only uses a short acting bronchodilator around the time of exercise.  She is now walking the mall 1 mile 3 times per week without any difficulty.  She continues to use mepolizumab and Breo and Spiriva.  She has had no problems with her nose.  She continues to use nasal steroid and montelukast.  Her reflux is under excellent control at this point in time on omeprazole and Pepcid.  She no longer he is using any nystatin on a consistent basis and thus she has had some problems with the corners of her mouth cracking.  She continues on immunosuppression for her rheumatoid arthritis and is now using treatment for colitis.  She has received 3 COVID vaccines, was infected with COVID on 1 occasion without long-term sequela, and has received this year's flu vaccine.  Allergies as of 10/17/2021       Reactions   Methotrexate Diarrhea   Advair Diskus [fluticasone-salmeterol]    Aspirin    Ciprofloxacin    Clonidine Derivatives    Nystatin    Oxycodone    Serevent [salmeterol]    Cephalexin Nausea And Vomiting        Medication List    ALIGN PO Take by mouth daily.   amphetamine-dextroamphetamine 5 MG tablet Commonly known as: ADDERALL Take  2 tablets by mouth daily.   Breo Ellipta 200-25 MCG/ACT Aepb Generic drug: fluticasone furoate-vilanterol Inhale one puff once daily   busPIRone 15 MG tablet Commonly known as: BUSPAR Take 15 mg by mouth 3 (three) times daily.   cetirizine 10 MG tablet Commonly known as: ZYRTEC Take 10 mg by mouth daily.   clorazepate 3.75 MG tablet Commonly known as: TRANXENE Take 3.75 mg by mouth 4 (four) times daily.   cyclobenzaprine 10 MG tablet Commonly known as: FLEXERIL Take 10 mg by mouth 2 (two) times daily.   EPINEPHrine 0.3 mg/0.3 mL Soaj injection Commonly known as: EpiPen 2-Pak Use as directed for life-threatening allergic reaction.   famotidine 20 MG tablet Commonly known as: PEPCID Take 1 tablet by mouth at bedtime.   Farxiga 10 MG Tabs tablet Generic drug: dapagliflozin propanediol Take 10 mg by mouth daily.   FLUoxetine 40 MG capsule Commonly known as: PROZAC Take 80 mg by mouth daily.   fluticasone 50 MCG/ACT nasal spray Commonly known as: FLONASE Place 2 sprays into both nostrils daily.   furosemide 40 MG tablet Commonly known as: LASIX Take 40 mg by mouth daily.   gabapentin 600 MG tablet Commonly known as: NEURONTIN Take 600 mg by mouth 3 (three) times daily.   Humira Pen 40 MG/0.8ML Pnkt Generic drug: Adalimumab INJECT 40 MG INTO THE SKIN EVERY 14 DAYS.   HYDROcodone-acetaminophen 10-325 MG tablet Commonly known as: NORCO Take 1 tablet by mouth 4 (  four) times daily.   icosapent Ethyl 1 g capsule Commonly known as: VASCEPA Take 2 capsules by mouth 2 (two) times daily.   Ipratropium-Albuterol 20-100 MCG/ACT Aers respimat Commonly known as: COMBIVENT Inhale 1 puff into the lungs every 6 (six) hours as needed for wheezing.   ipratropium-albuterol 0.5-2.5 (3) MG/3ML Soln Commonly known as: DUONEB CAN USE ONE VIAL VIA NEBULIZER EVERY 4-6 HOURS AS NEEDED FOR COUGH OR WHEEZE   Lantus SoloStar 100 UNIT/ML Solostar Pen Generic drug: insulin  glargine Inject 45 Units into the skin at bedtime.   leflunomide 10 MG tablet Commonly known as: ARAVA TAKE 2 TABLETS BY MOUTH EVERY DAY   levothyroxine 50 MCG tablet Commonly known as: SYNTHROID TAKE 1 TABLET BY MOUTH EVERY DAY AT 6 AM   Linzess 72 MCG capsule Generic drug: linaclotide Take 72 mcg by mouth daily.   mesalamine 1.2 g EC tablet Commonly known as: LIALDA Take 2.4 g by mouth daily.   montelukast 10 MG tablet Commonly known as: SINGULAIR Take 10 mg by mouth at bedtime.   naloxone 4 MG/0.1ML Liqd nasal spray kit Commonly known as: NARCAN Place 1 spray into the nose as directed.   Nucala 100 MG injection Generic drug: mepolizumab INJECT 100MG SUBCUTANEOUSLY EVERY 4 WEEKS   nystatin cream Commonly known as: MYCOSTATIN Apply 1 application topically as needed for dry skin.   olopatadine 0.1 % ophthalmic solution Commonly known as: PATANOL Place 1 drop into both eyes 2 (two) times daily.   omeprazole 40 MG capsule Commonly known as: PRILOSEC Take 40 mg by mouth 2 (two) times daily.   ondansetron 8 MG tablet Commonly known as: ZOFRAN Take 8 mg by mouth every 8 (eight) hours as needed for nausea or vomiting.   Ozempic (0.25 or 0.5 MG/DOSE) 2 MG/1.5ML Sopn Generic drug: Semaglutide(0.25 or 0.5MG/DOS) Inject 0.25-0.5 mg into the skin once a week.   potassium chloride SA 20 MEQ tablet Commonly known as: KLOR-CON M Take 20 mEq by mouth 2 (two) times daily.   pramipexole 0.125 MG tablet Commonly known as: MIRAPEX Take 0.125 mg by mouth daily.   pravastatin 40 MG tablet Commonly known as: PRAVACHOL Take 40 mg by mouth daily.   primidone 250 MG tablet Commonly known as: MYSOLINE Take 250 mg by mouth at bedtime.   Rexulti 2 MG Tabs tablet Generic drug: brexpiprazole Take 2 mg by mouth daily.   Spiriva Respimat 1.25 MCG/ACT Aers Generic drug: Tiotropium Bromide Monohydrate Inhale two puffs once daily   topiramate 50 MG tablet Commonly known as:  TOPAMAX Take 50 mg by mouth 2 (two) times daily.   triamcinolone cream 0.1 % Commonly known as: KENALOG Apply 1 application topically as needed (rash).    Past Medical History:  Diagnosis Date   Abnormal weight loss 11/15/2017   Acquired hypothyroidism 05/05/2015   Allergic rhinitis    Anxiety 08/27/2016   With depression   Asthma    Ataxic gait 05/02/2016   Cancer of kidney [facility])    Carpal tunnel syndrome of right wrist 04/17/2019   Chest pain, unspecified 06/23/2015   Formatting of this note might be different from the original. Overview:  Normal pharmacologic MPS Formatting of this note might be different from the original. Normal pharmacologic MPS Formatting of this note might be different from the original. Formatting of this note might be different from the original. Normal pharmacologic MPS Formatting of this note might be different from the original. Form   Chronic obstructive pulmonary disease ([place]) 08/27/2016  Chronic post-traumatic headache 02/17/2010   Current mild episode of major depressive disorder without prior episode ([place]) 12/12/2017   Dehydration    Depression, unspecified 02/17/2010   Diabetes 08/27/2016   Dyslipidemia 08/27/2016   Edema 05/10/2020   Elevated sed rate 07/13/2016   48 on 05/2016   Epistaxis 05/10/2020   Essential hypertension 05/18/2015   Fibroma 05/10/2020   Fibromyalgia 08/27/2016   Fluid overload 05/07/2020   Foot pain 07/13/2016   Generalized anxiety disorder 05/12/2015   GERD (gastroesophageal reflux disease)    Hand pain 07/13/2016   High risk medication use 07/18/2016   History of malignant neoplasm of kidney 02/17/2010   Formatting of this note might be different from the original. Formatting of this note might be different from the original. Right partial nephrectomy 2008 Formatting of this note might be different from the original. Overview:  Right partial nephrectomy 2008   History of renal cell carcinoma 08/27/2016   Right  partial nephrectomy 2008   Hypercholesterolemia 05/10/2020   Formatting of this note might be different from the original. Formatting of this note might be different from the original. 12/07/2014 - TC 151/Trig 199/HDL 47/LDL 64  Lp(a) 10 ( <30)   Hyperlipidemia associated with type 2 diabetes mellitus ([place]) 02/17/2010   Formatting of this note might be different from the original. Overview:  12/07/2014 - TC 151/Trig 199/HDL 47/LDL 64  Lp(a) 10 ( <30)  Overview:  12/07/2014 - TC 151/Trig 199/HDL 47/LDL 64  Lp(a) 10 ( <30) Formatting of this note might be different from the original. 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   Kidney disease    Migraine, unspecified, not intractable, without status migrainosus 05/10/2020   Mood disorder ([place]) 06/17/2015   Obesity 03/01/2015   Open wound of toe 05/03/2016   Other allergic rhinitis 04/30/2015   Other amnesia 02/17/2010   Other chronic diseases of tonsils and adenoids 05/10/2020   Other specified personal risk factors, not elsewhere classified 07/18/2016   Pain in right knee 08/28/2019   Rheumatoid arthritis ([place])    Risk for falls 05/02/2016   Severe persistent asthma 04/30/2015   Sleep apnea 08/27/2016   On CPAP, 2 L oxygen   Sleep disturbance 02/17/2010   Spinal stenosis in cervical region 02/17/2010   Tremor    Vitamin D deficiency 07/08/2015    Past Surgical History:  Procedure Laterality Date   APPENDECTOMY  1983   CESAREAN SECTION     2 times, [place]  2009   COLONOSCOPY W/ POLYPECTOMY  2021   ELBOW SURGERY Right    ulner nerve release   ESOPHAGEAL DILATION  2021   KIDNEY SURGERY Right 2008   1/2 right kidney removed   pellet insertion  03/2021   per patient   SHOULDER SURGERY Right 2006   TONSILLECTOMY  2011   TOTAL SHOULDER  ARTHROPLASTY     TUBAL LIGATION  1986   WRIST SURGERY Right    ulner nerve release    Review of systems negative except as noted in HPI / PMHx or noted below:  Review of Systems  Constitutional: Negative.   HENT: Negative.    Eyes: Negative.   Respiratory: Negative.    Cardiovascular: Negative.   Gastrointestinal: Negative.   Genitourinary: Negative.   Musculoskeletal: Negative.  Skin: Negative.   Neurological: Negative.   Endo/Heme/Allergies: Negative.   Psychiatric/Behavioral: Negative.      Objective:   Vitals:   10/17/21 0824  BP: 138/66  Pulse: 84  Resp: 12  SpO2: 97%   Height: 5' 2"  (157.5 cm)  Weight: 194 lb 6.4 oz (88.2 kg)   Physical Exam Constitutional:      Appearance: She is not diaphoretic.  HENT:     Head: Normocephalic.     Right Ear: Tympanic membrane, ear canal and external ear normal.     Left Ear: Tympanic membrane, ear canal and external ear normal.     Nose: Nose normal. No mucosal edema or rhinorrhea.     Mouth/Throat:     Pharynx: Uvula midline. Oropharyngeal exudate ([place]) present.  Eyes:     Conjunctiva/sclera: Conjunctivae normal.  Neck:     Thyroid: No thyromegaly.     Trachea: Trachea normal. No tracheal tenderness or tracheal deviation.  Cardiovascular:     Rate and Rhythm: Normal rate and regular rhythm.     Heart sounds: Normal heart sounds, S1 normal and S2 normal. No murmur heard. Pulmonary:     Effort: No respiratory distress.     Breath sounds: Normal breath sounds. No stridor. No wheezing or rales.  Lymphadenopathy:     Head:     Right side of head: No tonsillar adenopathy.     Left side of head: No tonsillar adenopathy.     Cervical: No cervical adenopathy.  Skin:    Findings: No erythema or rash.     Nails: There is no clubbing.  Neurological:     Mental Status: She is alert.    Diagnostics:    Spirometry was performed and demonstrated an FEV1 of 1.44 at 62 % of predicted.  Assessment and Plan:   1.  Asthma, severe persistent, well-controlled   2. Other allergic rhinitis   3. LPRD (laryngopharyngeal reflux disease)   4. Thrush   5. Immunosuppression ([place])     1. Continue Breo 200 - 1 inhalation +  Spiriva 1.25 Respimat - 2 inhalations daily   2. Continue Mepolizumab injections every 4 weeks   3. Continue Montelukast 10 mg - 1 tablet one time per day  4. Continue Flonase - 1-2 sprays each nostril 1 time per day  5. Continue Omeprazole 40 mg - 1 tablet two times per day + Pepcid 20 mg in evening  6. Continue Nystatin oral solution - 5 mls swish and spit after BREO / Spiriva use  7. Use Duoneb nebulization or Albuterol HFA 2 puffs every 4-6 hours and Zyrtec if needed.  8. Return to clinic in 6 months or earlier if problem  [name] is really doing very well at this point in time and she will continue to utilize a collection of anti-inflammatory agents for her airway including use of mepolizumab.  I encouraged her to use her nystatin after she completes her inhaler use as she does have some issues with chronic thrush and angular cheilitis.  Assuming she does well with this plan I will see her back in this clinic in 6 months or earlier if there is a problem.  Allena Katz, MD [facility] [place]

## 2021-10-18 ENCOUNTER — Encounter: Payer: Self-pay | Admitting: Allergy and Immunology

## 2021-10-24 ENCOUNTER — Other Ambulatory Visit (HOSPITAL_COMMUNITY): Payer: Self-pay

## 2021-11-09 ENCOUNTER — Ambulatory Visit: Payer: Medicare Other

## 2021-11-14 ENCOUNTER — Other Ambulatory Visit (HOSPITAL_COMMUNITY): Payer: Self-pay

## 2021-11-16 ENCOUNTER — Other Ambulatory Visit (HOSPITAL_COMMUNITY): Payer: Self-pay

## 2021-11-16 ENCOUNTER — Other Ambulatory Visit: Payer: Self-pay | Admitting: Physician Assistant

## 2021-11-16 MED ORDER — HUMIRA PEN 40 MG/0.8ML ~~LOC~~ PNKT
PEN_INJECTOR | SUBCUTANEOUS | 0 refills | Status: DC
  Filled 2021-11-24: qty 2, 28d supply, fill #0
  Filled 2022-01-02: qty 2, 28d supply, fill #1
  Filled 2022-02-10: qty 2, 28d supply, fill #2

## 2021-11-16 NOTE — Telephone Encounter (Signed)
Next Visit: 03/07/2022 ? ?Last Visit: 09/29/2021 ? ?Last Fill: 08/16/2021 ? ?DX: Rheumatoid arthritis of multiple sites with negative rheumatoid factor ? ?Current Dose per office note 09/29/2021: Humira 40 mg subcutaneous injections every 14 days  ? ?Labs: 09/29/2021 CBC WNL.  Creatinine is slightly elevated-1.12 and GFR is borderline low-57. Glucose is elevated-143.  ?Rest of CMP WNL.  ? ?TB Gold: 09/29/2021 Neg   ? ?Okay to refill Humira?  ?

## 2021-11-17 ENCOUNTER — Other Ambulatory Visit: Payer: Self-pay

## 2021-11-17 ENCOUNTER — Ambulatory Visit (INDEPENDENT_AMBULATORY_CARE_PROVIDER_SITE_OTHER): Payer: Medicare Other | Admitting: *Deleted

## 2021-11-17 DIAGNOSIS — J4550 Severe persistent asthma, uncomplicated: Secondary | ICD-10-CM | POA: Diagnosis not present

## 2021-11-18 ENCOUNTER — Other Ambulatory Visit (HOSPITAL_COMMUNITY): Payer: Self-pay

## 2021-11-24 ENCOUNTER — Other Ambulatory Visit (HOSPITAL_COMMUNITY): Payer: Self-pay

## 2021-12-13 ENCOUNTER — Telehealth: Payer: Self-pay | Admitting: *Deleted

## 2021-12-13 NOTE — Telephone Encounter (Signed)
Received DEXA results from [facility]. ? ?Date of Scan: 12/12/2021 ? ?Lowest T-score:-1.5 ? ?XLE:1.747 ? ?Lowest site measured:AP Spine ? ?DX: Osteopenia ? ?Significant changes in BMD and site measured (5% and above):8% Left Total Femur, 5% AP total Spine ? ?Current Regimen:n/a ? ?Recommendation: Calcium, Vitamin D and resistive exercises ? ?Reviewed by:Hazel Sams, PA-C ? ?Next Appointment:  03/07/2022 ? ?Patient advised of results and recommendations.  ?

## 2021-12-15 ENCOUNTER — Ambulatory Visit: Payer: Medicare Other

## 2021-12-24 ENCOUNTER — Other Ambulatory Visit: Payer: Self-pay | Admitting: Physician Assistant

## 2021-12-26 NOTE — Telephone Encounter (Signed)
Next Visit: 03/07/2022 ?  ?Last Visit: 09/29/2021 ?  ?Last Fill: 09/23/2021 ? ?DX: Rheumatoid arthritis of multiple sites with negative rheumatoid factor ?  ?Current Dose per office note 09/29/2021: Arava 20 mg by mouth daily ? ?Labs: 09/29/2021 CBC WNL.  Creatinine is slightly elevated-1.12 and GFR is borderline low-57. Glucose is elevated-143. Rest of CMP WNL.  ? ?Okay to refill Arava?  ?

## 2022-01-02 ENCOUNTER — Ambulatory Visit (INDEPENDENT_AMBULATORY_CARE_PROVIDER_SITE_OTHER): Payer: Medicare Other | Admitting: *Deleted

## 2022-01-02 ENCOUNTER — Other Ambulatory Visit (HOSPITAL_COMMUNITY): Payer: Self-pay

## 2022-01-02 DIAGNOSIS — J4550 Severe persistent asthma, uncomplicated: Secondary | ICD-10-CM | POA: Diagnosis not present

## 2022-01-09 ENCOUNTER — Other Ambulatory Visit: Payer: Self-pay | Admitting: Allergy and Immunology

## 2022-01-10 ENCOUNTER — Other Ambulatory Visit (HOSPITAL_COMMUNITY): Payer: Self-pay

## 2022-01-12 ENCOUNTER — Other Ambulatory Visit: Payer: Self-pay | Admitting: Physician Assistant

## 2022-01-27 ENCOUNTER — Other Ambulatory Visit (HOSPITAL_COMMUNITY): Payer: Self-pay

## 2022-01-30 ENCOUNTER — Ambulatory Visit: Payer: Medicare Other

## 2022-02-09 ENCOUNTER — Other Ambulatory Visit (HOSPITAL_COMMUNITY): Payer: Self-pay

## 2022-02-10 ENCOUNTER — Other Ambulatory Visit (HOSPITAL_COMMUNITY): Payer: Self-pay

## 2022-02-15 ENCOUNTER — Other Ambulatory Visit (HOSPITAL_COMMUNITY): Payer: Self-pay

## 2022-02-21 ENCOUNTER — Ambulatory Visit (INDEPENDENT_AMBULATORY_CARE_PROVIDER_SITE_OTHER): Payer: Medicare Other | Admitting: *Deleted

## 2022-02-21 DIAGNOSIS — J4550 Severe persistent asthma, uncomplicated: Secondary | ICD-10-CM | POA: Diagnosis not present

## 2022-02-22 ENCOUNTER — Other Ambulatory Visit (HOSPITAL_COMMUNITY): Payer: Self-pay

## 2022-03-01 NOTE — Progress Notes (Deleted)
Office Visit Note  Patient: Kimberly Jenkins             Date of Birth: [birth year]           MRN: [id number]             PCP: Darrol Jump, NP Referring: Darrol Jump, NP Visit Date: 03/07/2022 Occupation: '@GUAROCC'$ @  Subjective:  No chief complaint on file.   History of Present Illness: Kimberly Jenkins is a 56 y.o. female ***   Activities of Daily Living:  Patient reports morning stiffness for *** {minute/hour:19697}.   Patient {ACTIONS;DENIES/REPORTS:21021675::"Denies"} nocturnal pain.  Difficulty dressing/grooming: {ACTIONS;DENIES/REPORTS:21021675::"Denies"} Difficulty climbing stairs: {ACTIONS;DENIES/REPORTS:21021675::"Denies"} Difficulty getting out of chair: {ACTIONS;DENIES/REPORTS:21021675::"Denies"} Difficulty using hands for taps, buttons, cutlery, and/or writing: {ACTIONS;DENIES/REPORTS:21021675::"Denies"}  No Rheumatology ROS completed.   PMFS History:  Patient Active Problem List   Diagnosis Date Noted  . Cancer of kidney ([place]) 07/11/2021  . Dehydration 07/11/2021  . Kidney disease 07/11/2021  . Hypokalemia 05/11/2020  . Costochondritis 05/10/2020  . Edema 05/10/2020  . Epistaxis 05/10/2020  . Fibroma 05/10/2020  . Hypercholesterolemia 05/10/2020  . Iron deficiency anemia secondary to blood loss (chronic) 05/10/2020  . Migraine, unspecified, not intractable, without status migrainosus 05/10/2020  . Other chronic diseases of tonsils and adenoids 05/10/2020  . Hypertrophy of tonsils 05/10/2020  . Asthma 05/07/2020  . Fluid overload 05/07/2020  . Pain in right knee 08/28/2019  . Carpal tunnel syndrome of right wrist 04/17/2019  . Ulnar tunnel syndrome of right wrist 04/17/2019  . Lesion of ulnar nerve, right upper limb 11/12/2018  . Current mild episode of major depressive disorder without prior episode ([place]) 12/12/2017  . Abnormal weight loss 11/15/2017  . Fibromyalgia 08/27/2016  . History of right rotator cuff tear repair 08/27/2016  .  Diabetes 08/27/2016  . Chronic obstructive pulmonary disease ([place]) 08/27/2016  . Dyslipidemia 08/27/2016  . History of renal cell carcinoma 08/27/2016  . Anxiety 08/27/2016  . Sleep apnea 08/27/2016  . Rheumatoid arthritis of multiple sites with negative rheumatoid factor  07/18/2016  . High risk medication use 07/18/2016  . Other specified personal risk factors, not elsewhere classified 07/18/2016  . Hand pain 07/13/2016  . Foot pain 07/13/2016  . Elevated sed rate 07/13/2016  . Open wound of toe 05/03/2016  . Ataxic gait 05/02/2016  . Risk for falls 05/02/2016  . Tremor 05/02/2016  . Vitamin D deficiency 07/08/2015  . Chest pain, unspecified 06/23/2015  . Mood disorder ([place]) 06/17/2015  . Essential hypertension 05/18/2015  . Generalized anxiety disorder 05/12/2015  . Acquired hypothyroidism 05/05/2015  . Severe persistent asthma 04/30/2015  . Other allergic rhinitis 04/30/2015  . GERD (gastroesophageal reflux disease) 04/30/2015  . Chronic kidney disease, stage 2 (mild) 03/02/2015  . Obesity 03/01/2015  . Chronic post-traumatic headache 02/17/2010  . Depression, unspecified 02/17/2010  . History of malignant neoplasm of kidney 02/17/2010  . Hyperlipidemia associated with type 2 diabetes mellitus ([place]) 02/17/2010  . Other amnesia 02/17/2010  . 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 Sleep disturbance 02/17/2010  . Spinal stenosis in cervical region 02/17/2010    Past Medical History:  Diagnosis Date  . Abnormal weight loss 11/15/2017  . Acquired hypothyroidism 05/05/2015  . Allergic rhinitis   . Anxiety 08/27/2016   With depression  . Asthma   . Ataxic gait 05/02/2016  . Cancer of kidney ([place])   . Carpal tunnel syndrome of right wrist 04/17/2019  . Chest pain, unspecified 06/23/2015   Formatting of this note might be different from  the original. Overview:  Normal pharmacologic MPS Formatting of this note might be different from the original. Normal pharmacologic  MPS Formatting of this note might be different from the original. Formatting of this note might be different from the original. Normal pharmacologic MPS Formatting of this note might be different from the original. Form  . Chronic obstructive pulmonary disease ([place]) 08/27/2016  . Chronic post-traumatic headache 02/17/2010  . Current mild episode of major depressive disorder without prior episode ([place]) 12/12/2017  . Dehydration   . Depression, unspecified 02/17/2010  . Diabetes 08/27/2016  . Dyslipidemia 08/27/2016  . Edema 05/10/2020  . Elevated sed rate 07/13/2016   48 on 05/2016  . Epistaxis 05/10/2020  . Essential hypertension 05/18/2015  . Fibroma 05/10/2020  . Fibromyalgia 08/27/2016  . Fluid overload 05/07/2020  . Foot pain 07/13/2016  . Generalized anxiety disorder 05/12/2015  . GERD (gastroesophageal reflux disease)   . Hand pain 07/13/2016  . High risk medication use 07/18/2016  . History of malignant neoplasm of kidney 02/17/2010   Formatting of this note might be different from the original. Formatting of this note might be different from the original. Right partial nephrectomy 2008 Formatting of this note might be different from the original. Overview:  Right partial nephrectomy 2008  . History of renal cell carcinoma 08/27/2016   Right partial nephrectomy 2008  . Hypercholesterolemia 05/10/2020   Formatting of this note might be different from the original. Formatting of this note might be different from the original. 12/07/2014 - TC 151/Trig 199/HDL 47/LDL 64  Lp(a) 10 ( <30)  . Hyperlipidemia associated with type 2 diabetes mellitus ([place]) 02/17/2010   Formatting of this note might be different from the original. Overview:  12/07/2014 - TC 151/Trig 199/HDL 47/LDL 64  Lp(a) 10 ( <30)  Overview:  12/07/2014 - TC 151/Trig 199/HDL 47/LDL 64  Lp(a) 10 ( <30) Formatting of this note might be different from the original. 12/07/2014 - TC 151/Trig 199/HDL 47/LDL 64  Lp(a) 10 ( <30)  .  Hypertrophy of tonsils 05/10/2020  . Hypokalemia 05/11/2020  . Hypothyroidism   . Iron deficiency anemia secondary to blood loss (chronic) 05/10/2020  . Kidney disease   . Migraine, unspecified, not intractable, without status migrainosus 05/10/2020  . Mood disorder ([place]) 06/17/2015  . Obesity 03/01/2015  . Open wound of toe 05/03/2016  . Other allergic rhinitis 04/30/2015  . Other amnesia 02/17/2010  . Other chronic diseases of tonsils and adenoids 05/10/2020  . Other specified personal risk factors, not elsewhere classified 07/18/2016  . Pain in right knee 08/28/2019  . Rheumatoid arthritis ([place])   . Risk for falls 05/02/2016  . Severe persistent asthma 04/30/2015  . Sleep apnea 08/27/2016   On CPAP, 2 L oxygen  . Sleep disturbance 02/17/2010  . Spinal stenosis in cervical region 02/17/2010  . Tremor   . Vitamin D deficiency 07/08/2015    Family History  Problem Relation Age of Onset  . Brain cancer Brother   . Asthma Mother   . Lung cancer Mother   . Asthma Father   . Allergic rhinitis Father   . COPD Father   . Heart attack Father   . Arthritis Father   . Brain cancer Paternal Uncle   . Asthma Maternal Grandmother   . Brain cancer Paternal Grandmother   . Heart attack Brother    Past Surgical History:  Procedure Laterality Date  . APPENDECTOMY  1983  . CESAREAN SECTION     2 times, 1986 &  [place]  2009  . COLONOSCOPY W/ POLYPECTOMY  2021  . ELBOW SURGERY Right    ulner nerve release  . ESOPHAGEAL DILATION  2021  . KIDNEY SURGERY Right 2008   1/2 right kidney removed  . pellet insertion  03/2021   per patient  . SHOULDER SURGERY Right 2006  . TONSILLECTOMY  2011  . TOTAL SHOULDER ARTHROPLASTY    . TUBAL LIGATION  1986  . WRIST SURGERY Right    ulner nerve release   Social History   Social History Narrative  . Not on file   Immunization History  Administered Date(s) Administered  . PFIZER(Purple Top)SARS-COV-2 Vaccination  11/18/2019, 12/09/2019, 08/05/2020     Objective: Vital Signs: There were no vitals taken for this visit.   Physical Exam   Musculoskeletal Exam: ***  CDAI Exam: CDAI Score: -- Patient Global: --; Provider Global: -- Swollen: --; Tender: -- Joint Exam 03/07/2022   No joint exam has been documented for this visit   There is currently no information documented on the homunculus. Go to the Rheumatology activity and complete the homunculus joint exam.  Investigation: No additional findings.  Imaging: No results found.  Recent Labs: Lab Results  Component Value Date   WBC 6.6 09/29/2021   HGB 13.1 09/29/2021   PLT 251 09/29/2021   NA 139 09/29/2021   K 4.1 09/29/2021   CL 107 09/29/2021   CO2 24 09/29/2021   GLUCOSE 143 (H) 09/29/2021   BUN 9 09/29/2021   CREATININE 1.12 (H) 09/29/2021   BILITOT 0.3 09/29/2021   ALKPHOS 135 (H) 06/27/2021   AST 14 09/29/2021   ALT 11 09/29/2021   PROT 6.9 09/29/2021   ALBUMIN 4.1 06/27/2021   CALCIUM 8.8 09/29/2021   GFRAA 79 11/03/2020   QFTBGOLD NEGATIVE 06/26/2017   QFTBGOLDPLUS NEGATIVE 09/29/2021    Speciality Comments: No specialty comments available.  Procedures:  No procedures performed Allergies: Methotrexate, Advair diskus [fluticasone-salmeterol], Aspirin, Ciprofloxacin, Clonidine derivatives, Nystatin, Oxycodone, Serevent [salmeterol], and Cephalexin   Assessment / Plan:     Visit Diagnoses: No diagnosis found.  Orders: No orders of the defined types were placed in this encounter.  No orders of the defined types were placed in this encounter.   Face-to-face time spent with patient was *** minutes. Greater than 50% of time was spent in counseling and coordination of care.  Follow-Up Instructions: No follow-ups on file.   Earnestine Mealing, CMA  Note - This record has been created using Editor, commissioning.  Chart creation errors have been sought, but may not always  have been located. Such creation errors do  not reflect on  the standard of medical care.

## 2022-03-07 ENCOUNTER — Ambulatory Visit: Payer: Medicare Other | Admitting: Rheumatology

## 2022-03-07 DIAGNOSIS — M0609 Rheumatoid arthritis without rheumatoid factor, multiple sites: Secondary | ICD-10-CM

## 2022-03-07 DIAGNOSIS — Z8719 Personal history of other diseases of the digestive system: Secondary | ICD-10-CM

## 2022-03-07 DIAGNOSIS — M8589 Other specified disorders of bone density and structure, multiple sites: Secondary | ICD-10-CM

## 2022-03-07 DIAGNOSIS — E785 Hyperlipidemia, unspecified: Secondary | ICD-10-CM

## 2022-03-07 DIAGNOSIS — Z8639 Personal history of other endocrine, nutritional and metabolic disease: Secondary | ICD-10-CM

## 2022-03-07 DIAGNOSIS — M797 Fibromyalgia: Secondary | ICD-10-CM

## 2022-03-07 DIAGNOSIS — M7061 Trochanteric bursitis, right hip: Secondary | ICD-10-CM

## 2022-03-07 DIAGNOSIS — Z79899 Other long term (current) drug therapy: Secondary | ICD-10-CM

## 2022-03-07 DIAGNOSIS — Z85528 Personal history of other malignant neoplasm of kidney: Secondary | ICD-10-CM

## 2022-03-07 DIAGNOSIS — F5101 Primary insomnia: Secondary | ICD-10-CM

## 2022-03-07 DIAGNOSIS — Z780 Asymptomatic menopausal state: Secondary | ICD-10-CM

## 2022-03-07 DIAGNOSIS — R768 Other specified abnormal immunological findings in serum: Secondary | ICD-10-CM

## 2022-03-07 DIAGNOSIS — J449 Chronic obstructive pulmonary disease, unspecified: Secondary | ICD-10-CM

## 2022-03-07 DIAGNOSIS — M7062 Trochanteric bursitis, left hip: Secondary | ICD-10-CM

## 2022-03-08 ENCOUNTER — Other Ambulatory Visit (HOSPITAL_COMMUNITY): Payer: Self-pay

## 2022-03-08 ENCOUNTER — Other Ambulatory Visit: Payer: Self-pay | Admitting: Physician Assistant

## 2022-03-16 ENCOUNTER — Other Ambulatory Visit (HOSPITAL_COMMUNITY): Payer: Self-pay

## 2022-03-21 ENCOUNTER — Ambulatory Visit: Payer: Medicare Other

## 2022-03-24 ENCOUNTER — Other Ambulatory Visit (HOSPITAL_COMMUNITY): Payer: Self-pay

## 2022-04-03 ENCOUNTER — Ambulatory Visit (INDEPENDENT_AMBULATORY_CARE_PROVIDER_SITE_OTHER): Payer: Medicare Other | Admitting: *Deleted

## 2022-04-03 DIAGNOSIS — J4550 Severe persistent asthma, uncomplicated: Secondary | ICD-10-CM

## 2022-04-17 ENCOUNTER — Ambulatory Visit (INDEPENDENT_AMBULATORY_CARE_PROVIDER_SITE_OTHER): Payer: Medicare Other | Admitting: Allergy and Immunology

## 2022-04-17 VITALS — BP 112/68 | HR 76 | Resp 18 | Ht 61.9 in | Wt 190.2 lb

## 2022-04-17 DIAGNOSIS — K219 Gastro-esophageal reflux disease without esophagitis: Secondary | ICD-10-CM | POA: Diagnosis not present

## 2022-04-17 DIAGNOSIS — J3089 Other allergic rhinitis: Secondary | ICD-10-CM

## 2022-04-17 DIAGNOSIS — B370 Candidal stomatitis: Secondary | ICD-10-CM

## 2022-04-17 DIAGNOSIS — J4550 Severe persistent asthma, uncomplicated: Secondary | ICD-10-CM

## 2022-04-17 DIAGNOSIS — D849 Immunodeficiency, unspecified: Secondary | ICD-10-CM

## 2022-04-17 MED ORDER — IPRATROPIUM-ALBUTEROL 0.5-2.5 (3) MG/3ML IN SOLN: RESPIRATORY_TRACT | 1 refills | Status: DC

## 2022-04-17 NOTE — Progress Notes (Unsigned)
[facility]   Follow-up Note  Referring Provider: Darrol Jump, NP Primary Provider: Darrol Jump, NP Date of Office Visit: 04/17/2022  Subjective:   Kimberly Jenkins (DOB: [birth year]) is a 56 y.o. female who returns to the [facility] on 04/17/2022 in re-evaluation of the following:  HPI: Kimberly Jenkins returns to this clinic in reevaluation of asthma, allergic rhinitis, LPR, recurrent thrush, in the context of immunosuppression for rheumatoid arthritis.  Her last visit to this clinic was 17 October 2021.  She has done well with her airway and she believes that both her nose and her chest are doing well while she uses a collection of anti-inflammatory agents for both organ systems including the use of Breo, Spiriva, montelukast, Flonase, and every 4-week mepolizumab injection.  She uses a short acting bronchodilator around the time of exercise but otherwise is not using this agent in rescue mode.  She has not required a systemic steroid or an antibiotic for any type of airway issue.  She occasionally gets some cracks on the sides of her mouth for which she will use some nystatin which works well.  Her reflux is under okay control while using a combination of her proton pump inhibitor and H2 receptor blocker.  She continues on immunosuppression with Humira for her rheumatoid arthritis.  Allergies as of 04/17/2022       Reactions   Methotrexate Diarrhea   Advair Diskus [fluticasone-salmeterol]    Aspirin    Ciprofloxacin    Clonidine Derivatives    Nystatin    Oxycodone    Serevent [salmeterol]    Cephalexin Nausea And Vomiting        Medication List    ALIGN PO Take by mouth daily.   amphetamine-dextroamphetamine 15 MG tablet Commonly known as: ADDERALL Take 1 tablet by mouth 2 (two) times daily.   Breo Ellipta 200-25 MCG/ACT Aepb Generic drug: fluticasone furoate-vilanterol Inhale one puff once  daily   busPIRone 15 MG tablet Commonly known as: BUSPAR Take 15 mg by mouth 3 (three) times daily.   cetirizine 10 MG tablet Commonly known as: ZYRTEC Take 10 mg by mouth daily.   clorazepate 3.75 MG tablet Commonly known as: TRANXENE Take 3.75 mg by mouth 4 (four) times daily.   cyclobenzaprine 10 MG tablet Commonly known as: FLEXERIL Take 10 mg by mouth 2 (two) times daily.   EPINEPHrine 0.3 mg/0.3 mL Soaj injection Commonly known as: EpiPen 2-Pak Use as directed for life-threatening allergic reaction.   famotidine 20 MG tablet Commonly known as: PEPCID Take 1 tablet by mouth at bedtime.   FLUoxetine 40 MG capsule Commonly known as: PROZAC Take 80 mg by mouth daily.   fluticasone 50 MCG/ACT nasal spray Commonly known as: FLONASE Place 2 sprays into both nostrils daily.   furosemide 40 MG tablet Commonly known as: LASIX Take 40 mg by mouth daily.   gabapentin 600 MG tablet Commonly known as: NEURONTIN Take 600 mg by mouth 3 (three) times daily.   Humira Pen 40 MG/0.8ML Pnkt Generic drug: Adalimumab INJECT 40 MG INTO THE SKIN EVERY 14 DAYS.   HYDROcodone-acetaminophen 10-325 MG tablet Commonly known as: NORCO Take 1 tablet by mouth 4 (four) times daily.   icosapent Ethyl 1 g capsule Commonly known as: VASCEPA Take 2 capsules by mouth 2 (two) times daily.   Ipratropium-Albuterol 20-100 MCG/ACT Aers respimat Commonly known as: COMBIVENT Inhale 1 puff into the lungs every 6 (six) hours as  needed for wheezing.   ipratropium-albuterol 0.5-2.5 (3) MG/3ML Soln Commonly known as: DUONEB CAN USE ONE VIAL VIA NEBULIZER EVERY 4-6 HOURS AS NEEDED FOR COUGH OR WHEEZE   Lantus SoloStar 100 UNIT/ML Solostar Pen Generic drug: insulin glargine Inject 45 Units into the skin at bedtime.   leflunomide 10 MG tablet Commonly known as: ARAVA TAKE 2 TABLETS BY MOUTH EVERY DAY   levothyroxine 50 MCG tablet Commonly known as: SYNTHROID TAKE 1 TABLET BY MOUTH EVERY DAY  AT 6 AM   Linzess 72 MCG capsule Generic drug: linaclotide Take 72 mcg by mouth daily.   mesalamine 1.2 g EC tablet Commonly known as: LIALDA Take 2.4 g by mouth daily.   montelukast 10 MG tablet Commonly known as: SINGULAIR Take 10 mg by mouth at bedtime.   naloxone 4 MG/0.1ML Liqd nasal spray kit Commonly known as: NARCAN Place 1 spray into the nose as directed.   Nucala 100 MG injection Generic drug: mepolizumab INJECT 100MG SUBCUTANEOUSLY EVERY 4 WEEKS   nystatin 100000 UNIT/ML suspension Commonly known as: MYCOSTATIN Swish and spit with 5 mls after Breo/Spiriva use.   nystatin cream Commonly known as: MYCOSTATIN Apply 1 application topically as needed for dry skin.   olopatadine 0.1 % ophthalmic solution Commonly known as: PATANOL CAN USE ONE DROP IN EACH EYE TWICE DAILY IF NEEDED FOR RED, ITCHY, WATERY EYES.   omeprazole 40 MG capsule Commonly known as: PRILOSEC Take 40 mg by mouth 2 (two) times daily.   ondansetron 8 MG tablet Commonly known as: ZOFRAN Take 8 mg by mouth every 8 (eight) hours as needed for nausea or vomiting.   Ozempic (2 MG/DOSE) 8 MG/3ML Sopn Generic drug: Semaglutide (2 MG/DOSE) Inject 2 mg into the skin once a week.   potassium chloride SA 20 MEQ tablet Commonly known as: KLOR-CON M Take 20 mEq by mouth 2 (two) times daily.   pramipexole 0.125 MG tablet Commonly known as: MIRAPEX Take 0.125 mg by mouth daily.   pravastatin 40 MG tablet Commonly known as: PRAVACHOL Take 40 mg by mouth daily.   primidone 250 MG tablet Commonly known as: MYSOLINE Take 250 mg by mouth at bedtime.   Rexulti 2 MG Tabs tablet Generic drug: brexpiprazole Take 2 mg by mouth daily.   Spiriva Respimat 1.25 MCG/ACT Aers Generic drug: Tiotropium Bromide Monohydrate Inhale two puffs once daily   topiramate 50 MG tablet Commonly known as: TOPAMAX Take 50 mg by mouth 2 (two) times daily.   triamcinolone cream 0.1 % Commonly known as:  KENALOG Apply 1 application topically as needed (rash).   Vitamin D (Ergocalciferol) 1.25 MG (50000 UNIT) Caps capsule Commonly known as: DRISDOL Take 50,000 Units by mouth once a week.    Past Medical History:  Diagnosis Date   Abnormal weight loss 11/15/2017   Acquired hypothyroidism 05/05/2015   Allergic rhinitis    Anxiety 08/27/2016   With depression   Asthma    Ataxic gait 05/02/2016   Cancer of kidney [facility])    Carpal tunnel syndrome of right wrist 04/17/2019   Chest pain, unspecified 06/23/2015   Formatting of this note might be different from the original. Overview:  Normal pharmacologic MPS Formatting of this note might be different from the original. Normal pharmacologic MPS Formatting of this note might be different from the original. Formatting of this note might be different from the original. Normal pharmacologic MPS Formatting of this note might be different from the original. Form   Chronic obstructive pulmonary disease ([place])  08/27/2016   Chronic post-traumatic headache 02/17/2010   Current mild episode of major depressive disorder without prior episode ([place]) 12/12/2017   Dehydration    Depression, unspecified 02/17/2010   Diabetes 08/27/2016   Dyslipidemia 08/27/2016   Edema 05/10/2020   Elevated sed rate 07/13/2016   48 on 05/2016   Epistaxis 05/10/2020   Essential hypertension 05/18/2015   Fibroma 05/10/2020   Fibromyalgia 08/27/2016   Fluid overload 05/07/2020   Foot pain 07/13/2016   Generalized anxiety disorder 05/12/2015   GERD (gastroesophageal reflux disease)    Hand pain 07/13/2016   High risk medication use 07/18/2016   History of malignant neoplasm of kidney 02/17/2010   Formatting of this note might be different from the original. Formatting of this note might be different from the original. Right partial nephrectomy 2008 Formatting of this note might be different from the original. Overview:  Right partial nephrectomy 2008   History of renal  cell carcinoma 08/27/2016   Right partial nephrectomy 2008   Hypercholesterolemia 05/10/2020   Formatting of this note might be different from the original. Formatting of this note might be different from the original. 12/07/2014 - TC 151/Trig 199/HDL 47/LDL 64  Lp(a) 10 ( <30)   Hyperlipidemia associated with type 2 diabetes mellitus ([place]) 02/17/2010   Formatting of this note might be different from the original. Overview:  12/07/2014 - TC 151/Trig 199/HDL 47/LDL 64  Lp(a) 10 ( <30)  Overview:  12/07/2014 - TC 151/Trig 199/HDL 47/LDL 64  Lp(a) 10 ( <30) Formatting of this note might be different from the original. 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   Kidney disease    Migraine, unspecified, not intractable, without status migrainosus 05/10/2020   Mood disorder ([place]) 06/17/2015   Obesity 03/01/2015   Open wound of toe 05/03/2016   Other allergic rhinitis 04/30/2015   Other amnesia 02/17/2010   Other chronic diseases of tonsils and adenoids 05/10/2020   Other specified personal risk factors, not elsewhere classified 07/18/2016   Pain in right knee 08/28/2019   Rheumatoid arthritis ([place])    Risk for falls 05/02/2016   Severe persistent asthma 04/30/2015   Sleep apnea 08/27/2016   On CPAP, 2 L oxygen   Sleep disturbance 02/17/2010   Spinal stenosis in cervical region 02/17/2010   Tremor    Vitamin D deficiency 07/08/2015    Past Surgical History:  Procedure Laterality Date   APPENDECTOMY  1983   CESAREAN SECTION     2 times, [place]  2009   COLONOSCOPY W/ POLYPECTOMY  2021   ELBOW SURGERY Right    ulner nerve release   ESOPHAGEAL DILATION  2021   KIDNEY SURGERY Right 2008   1/2 right kidney removed   pellet insertion  03/2021   per patient   SHOULDER SURGERY Right 2006   TONSILLECTOMY   2011   TOTAL SHOULDER ARTHROPLASTY     TUBAL LIGATION  1986   WRIST SURGERY Right    ulner nerve release    Review of systems negative except as noted in HPI / PMHx or noted below:  Review of Systems  Constitutional: Negative.   HENT: Negative.    Eyes: Negative.   Respiratory: Negative.    Cardiovascular: Negative.   Gastrointestinal: Negative.   Genitourinary: Negative.   Musculoskeletal:  Negative.   Skin: Negative.   Neurological: Negative.   Endo/Heme/Allergies: Negative.   Psychiatric/Behavioral: Negative.       Objective:   Vitals:   04/17/22 0822  BP: 112/68  Pulse: 76  Resp: 18  SpO2: 99%   Height: 5' 1.9" (157.2 cm)  Weight: 190 lb 3.2 oz (86.3 kg)   Physical Exam Constitutional:      Appearance: She is not diaphoretic.  HENT:     Head: Normocephalic.     Right Ear: Tympanic membrane, ear canal and external ear normal.     Left Ear: Tympanic membrane, ear canal and external ear normal.     Nose: Nose normal. No mucosal edema or rhinorrhea.     Mouth/Throat:     Pharynx: Uvula midline. No oropharyngeal exudate.  Eyes:     Conjunctiva/sclera: Conjunctivae normal.  Neck:     Thyroid: No thyromegaly.     Trachea: Trachea normal. No tracheal tenderness or tracheal deviation.  Cardiovascular:     Rate and Rhythm: Normal rate and regular rhythm.     Heart sounds: Normal heart sounds, S1 normal and S2 normal. No murmur heard. Pulmonary:     Effort: No respiratory distress.     Breath sounds: Normal breath sounds. No stridor. No wheezing or rales.  Lymphadenopathy:     Head:     Right side of head: No tonsillar adenopathy.     Left side of head: No tonsillar adenopathy.     Cervical: No cervical adenopathy.  Skin:    Findings: No erythema or rash.     Nails: There is no clubbing.  Neurological:     Mental Status: She is alert.     Diagnostics:    Spirometry was performed and demonstrated an FEV1 of 1.41 at 67 % of predicted.  Assessment and  Plan:   1. Asthma, severe persistent, well-controlled   2. Other allergic rhinitis   3. LPRD (laryngopharyngeal reflux disease)   4. Thrush   5. Immunosuppression ([place])     1. Continue Breo 200 - 1 inhalation +  Spiriva 1.25 Respimat - 2 inhalations daily   2. Continue Mepolizumab injections every 4 weeks   3. Continue Montelukast 10 mg - 1 tablet one time per day  4. Continue Flonase - 1-2 sprays each nostril 1 time per day  5. Continue Omeprazole 40 mg - 1 tablet two times per day + Pepcid 20 mg in evening  6. If needed:  A. Nystatin oral solution - 5 mls swish and spit after BREO / Spiriva use B. Duoneb nebulization or Albuterol HFA 2 puffs every 4-6 hours C. Zyrtec   7. Obtain fall flu vaccine  8. Return to clinic in 6 months or earlier if problem  Overall Kimberly Jenkins appears to be doing quite well and she will continue to use a collection of anti-inflammatory agents for her airway including use of mepolizumab and also continue to address the issue with reflux with the therapy noted above.  Assuming she does well with this plan I will see her back in this clinic in 6 months or earlier if there is a problem.   Allena Katz, MD Allergy / Immunology [place]

## 2022-04-17 NOTE — Patient Instructions (Addendum)
  1. Continue Breo 200 - 1 inhalation +  Spiriva 1.25 Respimat - 2 inhalations daily   2. Continue Mepolizumab injections every 4 weeks   3. Continue Montelukast 10 mg - 1 tablet one time per day  4. Continue Flonase - 1-2 sprays each nostril 1 time per day  5. Continue Omeprazole 40 mg - 1 tablet two times per day + Pepcid 20 mg in evening  6. If needed:  A. Nystatin oral solution - 5 mls swish and spit after BREO / Spiriva use B. Duoneb nebulization or Albuterol HFA 2 puffs every 4-6 hours C. Zyrtec   7. Obtain fall flu vaccine  8. Return to clinic in 6 months or earlier if problem

## 2022-04-18 ENCOUNTER — Encounter: Payer: Self-pay | Admitting: Allergy and Immunology

## 2022-04-21 NOTE — Progress Notes (Unsigned)
Office Visit Note  Patient: Kimberly Jenkins             Date of Birth: [birth year]           MRN: [id number]             PCP: Kimberly Majestic, FNP Referring: Kimberly Jenkins, * Visit Date: 04/24/2022 Occupation: '@GUAROCC'$ @  Subjective:  Medication monitoring   History of Present Illness: Kimberly Jenkins is a 56 y.o. female with history of seronegative rheumatoid arthritis and fibromyalgia. She remains on Humira 40 mg subcutaneous injections every 14 days (11/02/2016) and Arava 20 mg by mouth daily.  She is tolerating combination therapy without any side effects.  She states she is 1 week overdue for her Humira injection due to requiring updated lab work and a follow-up visit.  She is having some increased arthralgias and joint stiffness but denies any joint swelling at this time.  She denies any recent rheumatoid arthritis flares.  She states she has been walking on a daily basis for exercise and has been going to [employer] twice weekly.  She states that she is due for her yearly dermatology visit but states that last year her visit was unremarkable.  She denies any recent or recurrent infections.  She states she is having some discomfort due to shock and bursitis of the left hip.  Her symptoms are exacerbated by lying on her left side at night.  She has not been performing any home exercises recently.  She states that she had an updated bone density on 12/12/2021.  She has not been taking any calcium or vitamin D supplements recently.  She denies any recent falls or fractures.   Activities of Daily Living:  Patient reports morning stiffness for a few minutes.   Patient Denies nocturnal pain.  Difficulty dressing/grooming: Reports Difficulty climbing stairs: Reports Difficulty getting out of chair: Reports Difficulty using hands for taps, buttons, cutlery, and/or writing: Reports  Review of Systems  Constitutional:  Positive for fatigue.  HENT:  Negative for mouth  sores, mouth dryness and nose dryness.   Eyes:  Positive for dryness. Negative for pain and visual disturbance.  Respiratory:  Negative for cough, hemoptysis, shortness of breath and difficulty breathing.   Cardiovascular:  Negative for chest pain, palpitations, hypertension and swelling in legs/feet.  Gastrointestinal:  Positive for constipation and diarrhea. Negative for blood in stool.  Endocrine: Negative for increased urination.  Genitourinary:  Negative for painful urination and involuntary urination.  Musculoskeletal:  Positive for joint pain, joint pain, myalgias, muscle weakness, morning stiffness, muscle tenderness and myalgias. Negative for gait problem and joint swelling.  Skin:  Negative for color change, pallor, rash, hair loss, nodules/bumps, skin tightness, ulcers and sensitivity to sunlight.  Allergic/Immunologic: Positive for susceptible to infections.  Neurological:  Negative for dizziness, numbness, headaches and weakness.  Hematological:  Negative for swollen glands.  Psychiatric/Behavioral:  Positive for depressed mood. Negative for sleep disturbance. The patient is nervous/anxious.     PMFS History:  Patient Active Problem List   Diagnosis Date Noted  . Cancer of kidney ([place]) 07/11/2021  . Dehydration 07/11/2021  . Kidney disease 07/11/2021  . Hypokalemia 05/11/2020  . Costochondritis 05/10/2020  . Edema 05/10/2020  . Epistaxis 05/10/2020  . Fibroma 05/10/2020  . Hypercholesterolemia 05/10/2020  . Iron deficiency anemia secondary to blood loss (chronic) 05/10/2020  . Migraine, unspecified, not intractable, without status migrainosus 05/10/2020  . Other chronic diseases of tonsils and adenoids 05/10/2020  .  Hypertrophy of tonsils 05/10/2020  . Asthma 05/07/2020  . Fluid overload 05/07/2020  . Pain in right knee 08/28/2019  . Carpal tunnel syndrome of right wrist 04/17/2019  . Ulnar tunnel syndrome of right wrist 04/17/2019  . Lesion of ulnar nerve, right  upper limb 11/12/2018  . Current mild episode of major depressive disorder without prior episode ([place]) 12/12/2017  . Abnormal weight loss 11/15/2017  . Fibromyalgia 08/27/2016  . History of right rotator cuff tear repair 08/27/2016  . Diabetes 08/27/2016  . Chronic obstructive pulmonary disease ([place]) 08/27/2016  . Dyslipidemia 08/27/2016  . History of renal cell carcinoma 08/27/2016  . Anxiety 08/27/2016  . Sleep apnea 08/27/2016  . Rheumatoid arthritis of multiple sites with negative rheumatoid factor  07/18/2016  . High risk medication use 07/18/2016  . Other specified personal risk factors, not elsewhere classified 07/18/2016  . Hand pain 07/13/2016  . Foot pain 07/13/2016  . Elevated sed rate 07/13/2016  . Open wound of toe 05/03/2016  . Ataxic gait 05/02/2016  . Risk for falls 05/02/2016  . Tremor 05/02/2016  . Vitamin D deficiency 07/08/2015  . Chest pain, unspecified 06/23/2015  . Mood disorder ([place]) 06/17/2015  . Essential hypertension 05/18/2015  . Generalized anxiety disorder 05/12/2015  . Acquired hypothyroidism 05/05/2015  . Severe persistent asthma 04/30/2015  . Other allergic rhinitis 04/30/2015  . GERD (gastroesophageal reflux disease) 04/30/2015  . Chronic kidney disease, stage 2 (mild) 03/02/2015  . Obesity 03/01/2015  . Chronic post-traumatic headache 02/17/2010  . Depression, unspecified 02/17/2010  . History of malignant neoplasm of kidney 02/17/2010  . Hyperlipidemia associated with type 2 diabetes mellitus ([place]) 02/17/2010  . Other amnesia 02/17/2010  . 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 Sleep disturbance 02/17/2010  . Spinal stenosis in cervical region 02/17/2010    Past Medical History:  Diagnosis Date  . Abnormal weight loss 11/15/2017  . Acquired hypothyroidism 05/05/2015  . Allergic rhinitis   . Anxiety 08/27/2016   With depression  . Asthma   . Ataxic gait 05/02/2016  . Cancer of kidney ([place])   . Carpal tunnel syndrome of right  wrist 04/17/2019  . Chest pain, unspecified 06/23/2015   Formatting of this note might be different from the original. Overview:  Normal pharmacologic MPS Formatting of this note might be different from the original. Normal pharmacologic MPS Formatting of this note might be different from the original. Formatting of this note might be different from the original. Normal pharmacologic MPS Formatting of this note might be different from the original. Form  . Chronic obstructive pulmonary disease ([place]) 08/27/2016  . Chronic post-traumatic headache 02/17/2010  . Current mild episode of major depressive disorder without prior episode ([place]) 12/12/2017  . Dehydration   . Depression, unspecified 02/17/2010  . Diabetes 08/27/2016  . Dyslipidemia 08/27/2016  . Edema 05/10/2020  . Elevated sed rate 07/13/2016   48 on 05/2016  . Epistaxis 05/10/2020  . Essential hypertension 05/18/2015  . Fibroma 05/10/2020  . Fibromyalgia 08/27/2016  . Fluid overload 05/07/2020  . Foot pain 07/13/2016  . Generalized anxiety disorder 05/12/2015  . GERD (gastroesophageal reflux disease)   . Hand pain 07/13/2016  . High risk medication use 07/18/2016  . History of malignant neoplasm of kidney 02/17/2010   Formatting of this note might be different from the original. Formatting of this note might be different from the original. Right partial nephrectomy 2008 Formatting of this note might be different from the original. Overview:  Right partial nephrectomy 2008  . History  of renal cell carcinoma 08/27/2016   Right partial nephrectomy 2008  . Hypercholesterolemia 05/10/2020   Formatting of this note might be different from the original. Formatting of this note might be different from the original. 12/07/2014 - TC 151/Trig 199/HDL 47/LDL 64  Lp(a) 10 ( <30)  . Hyperlipidemia associated with type 2 diabetes mellitus ([place]) 02/17/2010   Formatting of this note might be different from the original. Overview:  12/07/2014 - TC  151/Trig 199/HDL 47/LDL 64  Lp(a) 10 ( <30)  Overview:  12/07/2014 - TC 151/Trig 199/HDL 47/LDL 64  Lp(a) 10 ( <30) Formatting of this note might be different from the original. 12/07/2014 - TC 151/Trig 199/HDL 47/LDL 64  Lp(a) 10 ( <30)  . Hypertrophy of tonsils 05/10/2020  . Hypokalemia 05/11/2020  . Hypothyroidism   . Iron deficiency anemia secondary to blood loss (chronic) 05/10/2020  . Kidney disease   . Migraine, unspecified, not intractable, without status migrainosus 05/10/2020  . Mood disorder ([place]) 06/17/2015  . Obesity 03/01/2015  . Open wound of toe 05/03/2016  . Other allergic rhinitis 04/30/2015  . Other amnesia 02/17/2010  . Other chronic diseases of tonsils and adenoids 05/10/2020  . Other specified personal risk factors, not elsewhere classified 07/18/2016  . Pain in right knee 08/28/2019  . Rheumatoid arthritis ([place])   . Risk for falls 05/02/2016  . Severe persistent asthma 04/30/2015  . Sleep apnea 08/27/2016   On CPAP, 2 L oxygen  . Sleep disturbance 02/17/2010  . Spinal stenosis in cervical region 02/17/2010  . Tremor   . Vitamin D deficiency 07/08/2015    Family History  Problem Relation Age of Onset  . Brain cancer Brother   . Asthma Mother   . Lung cancer Mother   . Asthma Father   . Allergic rhinitis Father   . COPD Father   . Heart attack Father   . Arthritis Father   . Brain cancer Paternal Uncle   . Asthma Maternal Grandmother   . Brain cancer Paternal Grandmother   . Heart attack Brother    Past Surgical History:  Procedure Laterality Date  . APPENDECTOMY  1983  . CESAREAN SECTION     2 times, [place]  . CHOLECYSTECTOMY  2009  . COLONOSCOPY W/ POLYPECTOMY  2021  . ELBOW SURGERY Right    ulner nerve release  . ESOPHAGEAL DILATION  2021  . KIDNEY SURGERY Right 2008   1/2 right kidney removed  . pellet insertion  03/2021   per patient  . SHOULDER SURGERY Right 2006  . TONSILLECTOMY  2011  . TOTAL SHOULDER ARTHROPLASTY    . TUBAL  LIGATION  1986  . WRIST SURGERY Right    ulner nerve release   Social History   Social History Narrative  . Not on file   Immunization History  Administered Date(s) Administered  . PFIZER(Purple Top)SARS-COV-2 Vaccination 11/18/2019, 12/09/2019, 08/05/2020     Objective: Vital Signs: BP 134/84 (BP Location: Left Arm, Patient Position: Sitting, Cuff Size: Normal)   Pulse 82   Resp 17   Ht 5' 1.9" (1.572 m)   Wt 190 lb 6.4 oz (86.4 kg)   BMI 34.94 kg/m    Physical Exam Vitals and nursing note reviewed.  Constitutional:      Appearance: She is well-developed.  HENT:     Head: Normocephalic and atraumatic.  Eyes:     Conjunctiva/sclera: Conjunctivae normal.  Cardiovascular:     Rate and Rhythm: Normal rate and regular rhythm.  Heart sounds: Normal heart sounds.  Pulmonary:     Effort: Pulmonary effort is normal.     Breath sounds: Normal breath sounds.  Abdominal:     General: Bowel sounds are normal.     Palpations: Abdomen is soft.  Musculoskeletal:     Cervical back: Normal range of motion.  Skin:    General: Skin is warm and dry.     Capillary Refill: Capillary refill takes less than 2 seconds.  Neurological:     Mental Status: She is alert and oriented to person, place, and time.  Psychiatric:        Behavior: Behavior normal.     Musculoskeletal Exam: C-spine has limited range of motion with lateral rotation rotation.  Trapezius muscle tension and tenderness bilaterally.  Shoulder joints, elbow joints, wrist joints, MCPs, PIPs, and DIPs good ROM.  No tenderness or synovitis of MCPs.  PIP and DIP has mild thickening consistent with osteoarthritis of both hands.  Hip joints have good range of motion with no groin pain.  Tenderness over the left trochanteric bursa and IT band.  Knee joints have good range of motion with no warmth or effusion.  Ankle joints have good range of motion with no tenderness or synovitis.  No tenderness or synovitis over MTP  joints.  CDAI Exam: CDAI Score: 0.4  Patient Global: 2 mm; Provider Global: 2 mm Swollen: 0 ; Tender: 0  Joint Exam 04/24/2022   No joint exam has been documented for this visit   There is currently no information documented on the homunculus. Go to the Rheumatology activity and complete the homunculus joint exam.  Investigation: No additional findings.  Imaging: No results found.  Recent Labs: Lab Results  Component Value Date   WBC 6.6 09/29/2021   HGB 13.1 09/29/2021   PLT 251 09/29/2021   NA 139 09/29/2021   K 4.1 09/29/2021   CL 107 09/29/2021   CO2 24 09/29/2021   GLUCOSE 143 (H) 09/29/2021   BUN 9 09/29/2021   CREATININE 1.12 (H) 09/29/2021   BILITOT 0.3 09/29/2021   ALKPHOS 135 (H) 06/27/2021   AST 14 09/29/2021   ALT 11 09/29/2021   PROT 6.9 09/29/2021   ALBUMIN 4.1 06/27/2021   CALCIUM 8.8 09/29/2021   GFRAA 79 11/03/2020   QFTBGOLD NEGATIVE 06/26/2017   QFTBGOLDPLUS NEGATIVE 09/29/2021    Speciality Comments: No specialty comments available.  Procedures:  No procedures performed Allergies: Methotrexate, Advair diskus [fluticasone-salmeterol], Aspirin, Ciprofloxacin, Clonidine derivatives, Nystatin, Oxycodone, Serevent [salmeterol], and Cephalexin      Assessment / Plan:     Visit Diagnoses: Rheumatoid arthritis of multiple sites with negative rheumatoid factor: She has no joint tenderness or synovitis on examination today.  She has not had any signs or symptoms of a rheumatoid arthritis flare.  She has clinically been doing well on Humira 40 mg subcu as injections every 14 days and Arava 20 mg daily.  She is tolerating combination therapy without any side effects, injection site reactions, recurrent infections.  She is 1 week overdue for her Humira dose due to requiring updated lab work, refill, and office visit.  She is having some increased joint stiffness and arthralgias but has no inflammation on examination today.  She will remain on combination  therapy as prescribed.  CBC and CMP were updated today.  A refill of Humira was sent to the pharmacy.  He was advised to notify us if she develops increased joint pain or joint swelling.  She will follow-up  in the office in 3 months or sooner if needed.  High risk medication use - Humira 40 mg subcutaneous injections every 14 days (11/02/2016), Arava 20 mg by mouth daily (12/13/2016) (inadequate response to Enbrel, side effects from MTX). - Plan: COMPLETE METABOLIC PANEL WITH GFR, CBC with Differential/Platelet CBC and CMP updated on 09/29/21. Orders for CBC and CMP released today. Her next lab work will be due in November and every 3 months.  Standing orders for CBC and CMP remain in place.  TB gold negative on 09/29/21. She has not had any recent or recurrent infections.  Discussed the importance of holding humira and arava if she develops signs or symptoms of an infection and to resume once the infection has completely cleared.  Discussed the importance of yearly dermatology visits for skin cancer screening.  Fibromyalgia: She experiences intermittent myalgias and muscle tenderness due to fibromyalgia.  On examination today she has trapezius muscle tension and tenderness bilaterally.  She is also experiencing discomfort due to target bursitis of the left hip.  Patient was encouraged perform stretching exercises on a daily basis.  She is given a handout of home exercises to perform.  He has been walking on a daily basis for exercise and has been going to [employer] sne[employer] twice weekly.  Discussed the importance of regular exercise and good sleep hygiene.  Trochanteric bursitis, left hip: She presents today with discomfort in the left hip.  Her symptoms are exacerbated by lying on her left side at night.  On examination she has good range of motion of the left hip joint with no groin pain.  She has tenderness over the left trochanteric bursa and IT band.  Different treatment options were discussed today.  She  was given handout of exercises to perform at home.  If her symptoms persist or worsen I would recommend a referral to physical therapy.  She is not a good candidate for cortisone injections due to history of type 2 diabetes.  Primary insomnia: She is been experiencing some nocturnal pain when lying on her left side at night due to trochanter bursitis of the left hip.  Patient was given a handout of exercises to perform.  Discussed the importance of good sleep hygiene.  Positive ANA (antinuclear antibody): She has no clinical features of systemic lupus.  Osteopenia of multiple sites -Previous DEXA on 12/12/2019: AP spine BMD 0.842 with T score -1.9.   DEXA updated on 12/12/21: AP spine BMD 0.883 with T score -1.5.  Statistically significant increase in BMD of AP spine and left hip.  Right hip stable.  Repeat DEXA in April 2025.  Discussed the importance of calcium 1200 mg daily, vitamin D 2000 units daily, and performing resistive exercises daily.  Calcium and vitamin D will be checked today.  She has not had any recent falls or fractures.  Vitamin D deficiency -Patient has a known history of vitamin D deficiency and osteopenia.  She has not been taking vitamin D supplement recently.  Vitamin D level will be checked today.  Plan: VITAMIN D 25 Hydroxy (Vit-D Deficiency, Fractures)  Other medical conditions are listed as follows:  History of gastroesophageal reflux (GERD)  History of COPD  Dyslipidemia  History of renal cell carcinoma  History of hypothyroidism  History of diabetes mellitus  Elevated hemoglobin A1c -patient is now taking Ozempic.  She requested to have hemoglobin A1c checked today and results forwarded to her PCP.  Plan: HgB A1c    Orders: Orders Placed This Encounter  Procedures  . COMPLETE METABOLIC PANEL WITH GFR  . CBC with Differential/Platelet  . HgB A1c  . VITAMIN D 25 Hydroxy (Vit-D Deficiency, Fractures)   Meds ordered this encounter  Medications  .  Adalimumab (HUMIRA PEN) 40 MG/0.8ML PNKT    Sig: INJECT 40 MG INTO THE SKIN EVERY 14 DAYS.    Dispense:  6 each    Refill:  0     Follow-Up Instructions: Return in about 3 months (around 07/25/2022) for Rheumatoid arthritis, Fibromyalgia.   Ofilia Neas, PA-C  Note - This record has been created using Dragon software.  Chart creation errors have been sought, but may not always  have been located. Such creation errors do not reflect on  the standard of medical care.

## 2022-04-24 ENCOUNTER — Encounter: Payer: Self-pay | Admitting: Physician Assistant

## 2022-04-24 ENCOUNTER — Other Ambulatory Visit (HOSPITAL_COMMUNITY): Payer: Self-pay

## 2022-04-24 ENCOUNTER — Ambulatory Visit: Payer: Medicare Other | Attending: Physician Assistant | Admitting: Physician Assistant

## 2022-04-24 VITALS — BP 134/84 | HR 82 | Resp 17 | Ht 61.9 in | Wt 190.4 lb

## 2022-04-24 DIAGNOSIS — M7061 Trochanteric bursitis, right hip: Secondary | ICD-10-CM

## 2022-04-24 DIAGNOSIS — M0609 Rheumatoid arthritis without rheumatoid factor, multiple sites: Secondary | ICD-10-CM

## 2022-04-24 DIAGNOSIS — E559 Vitamin D deficiency, unspecified: Secondary | ICD-10-CM

## 2022-04-24 DIAGNOSIS — M797 Fibromyalgia: Secondary | ICD-10-CM

## 2022-04-24 DIAGNOSIS — R7689 Other specified abnormal immunological findings in serum: Secondary | ICD-10-CM

## 2022-04-24 DIAGNOSIS — E785 Hyperlipidemia, unspecified: Secondary | ICD-10-CM

## 2022-04-24 DIAGNOSIS — R768 Other specified abnormal immunological findings in serum: Secondary | ICD-10-CM

## 2022-04-24 DIAGNOSIS — Z8719 Personal history of other diseases of the digestive system: Secondary | ICD-10-CM

## 2022-04-24 DIAGNOSIS — Z8709 Personal history of other diseases of the respiratory system: Secondary | ICD-10-CM

## 2022-04-24 DIAGNOSIS — M7062 Trochanteric bursitis, left hip: Secondary | ICD-10-CM

## 2022-04-24 DIAGNOSIS — R7309 Other abnormal glucose: Secondary | ICD-10-CM

## 2022-04-24 DIAGNOSIS — F5101 Primary insomnia: Secondary | ICD-10-CM

## 2022-04-24 DIAGNOSIS — Z85528 Personal history of other malignant neoplasm of kidney: Secondary | ICD-10-CM

## 2022-04-24 DIAGNOSIS — Z79899 Other long term (current) drug therapy: Secondary | ICD-10-CM | POA: Diagnosis not present

## 2022-04-24 DIAGNOSIS — M8589 Other specified disorders of bone density and structure, multiple sites: Secondary | ICD-10-CM

## 2022-04-24 DIAGNOSIS — Z8639 Personal history of other endocrine, nutritional and metabolic disease: Secondary | ICD-10-CM

## 2022-04-24 MED ORDER — HUMIRA PEN 40 MG/0.8ML ~~LOC~~ PNKT
PEN_INJECTOR | SUBCUTANEOUS | 0 refills | Status: DC
  Filled 2022-04-24: qty 2, 28d supply, fill #0
  Filled 2022-05-12: qty 2, 28d supply, fill #1
  Filled 2022-06-09: qty 2, 28d supply, fill #2

## 2022-04-24 NOTE — Patient Instructions (Signed)
Standing Labs We placed an order today for your standing lab work.   Please have your standing labs drawn in November and every 3 months   If possible, please have your labs drawn 2 weeks prior to your appointment so that the provider can discuss your results at your appointment.  Please note that you may see your imaging and lab results in [place] before we have reviewed them. We may be awaiting multiple results to interpret others before contacting you. Please allow our office up to 72 hours to thoroughly review all of the results before contacting the office for clarification of your results.  We currently have open lab daily: Monday through Thursday from 1:30 PM-4:30 PM and Friday from 1:30 PM- 4:00 PM If possible, please come for your lab work on Monday, Thursday or Friday afternoons, as you may experience shorter wait times.   Effective June 28, 2022 the new lab hours will change to: Monday through Thursday from 1:30 PM-5:00 PM and Friday from 8:30 AM-12:00 PM If possible, please come for your lab work on Monday and Thursday afternoons, as you may experience shorter wait times.  Please be advised, all patients with office appointments requiring lab work will take precedent over walk-in lab work.    The office is located at [address] [place], [place], [place] [address] No appointment is necessary.   Labs are drawn by [facility]. Please bring your co-pay at the time of your lab draw.  You may receive a bill from [place] [facility] for your lab work.  Please note if you are on Hydroxychloroquine and and an order has been placed for a Hydroxychloroquine level, you will need to have it drawn 4 hours or more after your last dose.  If you wish to have your labs drawn at another location, please call the office 24 hours in advance to send orders.  If you have any questions regarding directions or hours of operation,  please call [phone].   As a reminder, please drink plenty of water prior  to coming for your lab work. Thanks!  If you have signs or symptoms of an infection or start antibiotics: First, call your PCP for workup of your infection. Hold your medication through the infection, until you complete your antibiotics, and until symptoms resolve if you take the following: Injectable medication (Actemra, Benlysta, Cimzia, Cosentyx, Enbrel, Humira, Kevzara, Orencia, Remicade, Simponi, Stelara, Taltz, Tremfya) Methotrexate Leflunomide (Arava) Mycophenolate (Cellcept) Roma Kayser, or Rinvoq Iliotibial Band Syndrome Rehab Ask your health care provider which exercises are safe for you. Do exercises exactly as told by your health care provider and adjust them as directed. It is normal to feel mild stretching, pulling, tightness, or discomfort as you do these exercises. Stop right away if you feel sudden pain or your pain gets significantly worse. Do not begin these exercises until told by your health care provider. Stretching and range-of-motion exercises These exercises warm up your muscles and joints and improve the movement and flexibility of your hip and pelvis. Quadriceps stretch, prone  Lie on your abdomen (prone position) on a firm surface, such as a bed or padded floor. Bend your left / right knee and reach back to hold your ankle or pant leg. If you cannot reach your ankle or pant leg, loop a belt around your foot and grab the belt instead. Gently pull your heel toward your buttocks. Your knee should not slide out to the side. You should feel a stretch in the front of your  thigh and knee (quadriceps). Hold this position for __________ seconds. Repeat __________ times. Complete this exercise __________ times a day. Iliotibial band stretch An iliotibial band is a strong band of muscle tissue that runs from the outer side of your hip to the outer side of your thigh and knee. Lie on your side with your left / right leg in the top position. Bend both of your knees and  grab your left / right ankle. Stretch out your bottom arm to help you balance. Slowly bring your top knee back so your thigh goes behind your trunk. Slowly lower your top leg toward the floor until you feel a gentle stretch on the outside of your left / right hip and thigh. If you do not feel a stretch and your knee will not fall farther, place the heel of your other foot on top of your knee and pull your knee down toward the floor with your foot. Hold this position for __________ seconds. Repeat __________ times. Complete this exercise __________ times a day. Strengthening exercises These exercises build strength and endurance in your hip and pelvis. Endurance is the ability to use your muscles for a long time, even after they get tired. Straight leg raises, side-lying This exercise strengthens the muscles that rotate the leg at the hip and move it away from your body (hip abductors). Lie on your side with your left / right leg in the top position. Lie so your head, shoulder, hip, and knee line up. You may bend your bottom knee to help you balance. Roll your hips slightly forward so your hips are stacked directly over each other and your left / right knee is facing forward. Tense the muscles in your outer thigh and lift your top leg 4-6 inches (10-15 cm). Hold this position for __________ seconds. Slowly lower your leg to return to the starting position. Let your muscles relax completely before doing another repetition. Repeat __________ times. Complete this exercise __________ times a day. Leg raises, prone This exercise strengthens the muscles that move the hips backward (hip extensors). Lie on your abdomen (prone position) on your bed or a firm surface. You can put a pillow under your hips if that is more comfortable for your lower back. Bend your left / right knee so your foot is straight up in the air. Squeeze your buttocks muscles and lift your left / right thigh off the bed. Do not let  your back arch. Tense your thigh muscle as hard as you can without increasing any knee pain. Hold this position for __________ seconds. Slowly lower your leg to return to the starting position and allow it to relax completely. Repeat __________ times. Complete this exercise __________ times a day. Hip hike Stand sideways on a bottom step. Stand on your left / right leg with your other foot unsupported next to the step. You can hold on to a railing or wall for balance if needed. Keep your knees straight and your torso square. Then lift your left / right hip up toward the ceiling. Slowly let your left / right hip lower toward the floor, past the starting position. Your foot should get closer to the floor. Do not lean or bend your knees. Repeat __________ times. Complete this exercise __________ times a day. This information is not intended to replace advice given to you by your health care provider. Make sure you discuss any questions you have with your health care provider. Document Revised: 10/22/2019 Document Reviewed: 10/22/2019 Elsevier  Patient Education  [place] Ask your health care provider which exercises are safe for you. Do exercises exactly as told by your health care provider and adjust them as directed. It is normal to feel mild stretching, pulling, tightness, or discomfort as you do these exercises. Stop right away if you feel sudden pain or your pain gets worse. Do not begin these exercises until told by your health care provider. Stretching exercise This exercise warms up your muscles and joints and improves the movement and flexibility of your hip. This exercise also helps to relieve pain and stiffness. Iliotibial band stretch An iliotibial band is a strong band of muscle tissue that runs from the outer side of your hip to the outer side of your thigh and knee. Lie on your side with your left / right leg in the top position. Bend your left / right  knee and grab your ankle. Stretch out your bottom arm to help you balance. Slowly bring your knee back so your thigh is slightly behind your body. Slowly lower your knee toward the floor until you feel a gentle stretch on the outside of your left / right thigh. If you do not feel a stretch and your knee will not lower more toward the floor, place the heel of your other foot on top of your knee and pull your knee down toward the floor with your foot. Hold this position for __________ seconds. Slowly return to the starting position. Repeat __________ times. Complete this exercise __________ times a day. Strengthening exercises These exercises build strength and endurance in your hip and pelvis. Endurance is the ability to use your muscles for a long time, even after they get tired. Bridge This exercise strengthens the muscles that move your thigh backward (hip extensors). Lie on your back on a firm surface with your knees bent and your feet flat on the floor. Tighten your buttocks muscles and lift your buttocks off the floor until your trunk is level with your thighs. Do not arch your back. You should feel the muscles working in your buttocks and the back of your thighs. If you do not feel these muscles, slide your feet 1-2 inches (2.5-5 cm) farther away from your buttocks. If this exercise is too easy, try doing it with your arms crossed over your chest. Hold this position for __________ seconds. Slowly lower your hips to the starting position. Let your muscles relax completely after each repetition. Repeat __________ times. Complete this exercise __________ times a day. Squats This exercise strengthens the muscles in front of your thigh and knee (quadriceps). Stand in front of a table, with your feet and knees pointing straight ahead. You may rest your hands on the table for balance but not for support. Slowly bend your knees and lower your hips like you are going to sit in a chair. Keep your  weight over your heels, not over your toes. Keep your lower legs upright so they are parallel with the table legs. Do not let your hips go lower than your knees. Do not bend lower than told by your health care provider. If your hip pain increases, do not bend as low. Hold the squat position for __________ seconds. Slowly push with your legs to return to standing. Do not use your hands to pull yourself to standing. Repeat __________ times. Complete this exercise __________ times a day. Hip hike  Stand sideways on a bottom step. Stand on your left / right  leg with your other foot unsupported next to the step. You can hold on to the railing or wall for balance if needed. Keep your knees straight and your torso square. Then lift your left / right hip up toward the ceiling. Hold this position for __________ seconds. Slowly let your left / right hip lower toward the floor, past the starting position. Your foot should get closer to the floor. Do not lean or bend your knees. Repeat __________ times. Complete this exercise __________ times a day. Single leg stand This exercise increases your balance. Without shoes, stand near a railing or in a doorway. You may hold on to the railing or door frame as needed for balance. Squeeze your left / right buttock muscles, then lift up your other foot. Do not let your left / right hip push out to the side. It is helpful to stand in front of a mirror for this exercise so you can watch your hip. Hold this position for __________ seconds. Repeat __________ times. Complete this exercise __________ times a day. This information is not intended to replace advice given to you by your health care provider. Make sure you discuss any questions you have with your health care provider. Document Revised: 07/27/2021 Document Reviewed: 07/27/2021 Elsevier Patient Education  [place].

## 2022-04-25 LAB — COMPLETE METABOLIC PANEL WITH GFR
AG Ratio: 1.3 (calc) (ref 1.0–2.5)
ALT: 12 U/L (ref 6–29)
AST: 11 U/L (ref 10–35)
Albumin: 3.9 g/dL (ref 3.6–5.1)
Alkaline phosphatase (APISO): 86 U/L (ref 37–153)
BUN: 10 mg/dL (ref 7–25)
CO2: 25 mmol/L (ref 20–32)
Calcium: 8.8 mg/dL (ref 8.6–10.4)
Chloride: 107 mmol/L (ref 98–110)
Creat: 1 mg/dL (ref 0.50–1.03)
Globulin: 2.9 g/dL (calc) (ref 1.9–3.7)
Glucose, Bld: 109 mg/dL — ABNORMAL HIGH (ref 65–99)
Potassium: 4.2 mmol/L (ref 3.5–5.3)
Sodium: 140 mmol/L (ref 135–146)
Total Bilirubin: 0.3 mg/dL (ref 0.2–1.2)
Total Protein: 6.8 g/dL (ref 6.1–8.1)
eGFR: 65 mL/min/{1.73_m2} (ref >=60)

## 2022-04-25 LAB — CBC WITH DIFFERENTIAL/PLATELET
Absolute Monocytes: 657 cells/uL (ref 200–950)
Basophils Absolute: 41 cells/uL (ref 0–200)
Basophils Relative: 0.9 %
Eosinophils Absolute: 59 cells/uL (ref 15–500)
Eosinophils Relative: 1.3 %
HCT: 39.6 % (ref 35.0–45.0)
Hemoglobin: 12.9 g/dL (ref 11.7–15.5)
Lymphs Abs: 1931 cells/uL (ref 850–3900)
MCH: 28.9 pg (ref 27.0–33.0)
MCHC: 32.6 g/dL (ref 32.0–36.0)
MCV: 88.6 fL (ref 80.0–100.0)
MPV: 10.1 fL (ref 7.5–12.5)
Monocytes Relative: 14.6 %
Neutro Abs: 1814 cells/uL (ref 1500–7800)
Neutrophils Relative %: 40.3 %
Platelets: 225 10*3/uL (ref 140–400)
RBC: 4.47 10*6/uL (ref 3.80–5.10)
RDW: 14.1 % (ref 11.0–15.0)
Total Lymphocyte: 42.9 %
WBC: 4.5 10*3/uL (ref 3.8–10.8)

## 2022-04-25 LAB — VITAMIN D 25 HYDROXY (VIT D DEFICIENCY, FRACTURES): Vit D, 25-Hydroxy: 87 ng/mL (ref 30–100)

## 2022-04-25 LAB — HEMOGLOBIN A1C
Hgb A1c MFr Bld: 5.6 % of total Hgb (ref <5.7)
Mean Plasma Glucose: 114 mg/dL
eAG (mmol/L): 6.3 mmol/L

## 2022-04-25 NOTE — Progress Notes (Signed)
Glucose is 109. Rest of CMP WNL.  CBC WNL.  Vitamin D WNL.  Hgb A1C-5.6%. please notify the patient and forward results to PCP

## 2022-05-02 ENCOUNTER — Ambulatory Visit: Payer: Medicare Other

## 2022-05-04 ENCOUNTER — Ambulatory Visit (INDEPENDENT_AMBULATORY_CARE_PROVIDER_SITE_OTHER): Payer: Medicare Other | Admitting: *Deleted

## 2022-05-04 DIAGNOSIS — J4550 Severe persistent asthma, uncomplicated: Secondary | ICD-10-CM | POA: Diagnosis not present

## 2022-05-12 ENCOUNTER — Other Ambulatory Visit (HOSPITAL_COMMUNITY): Payer: Self-pay

## 2022-05-18 ENCOUNTER — Other Ambulatory Visit (HOSPITAL_COMMUNITY): Payer: Self-pay

## 2022-05-24 ENCOUNTER — Other Ambulatory Visit (HOSPITAL_COMMUNITY): Payer: Self-pay

## 2022-06-01 ENCOUNTER — Ambulatory Visit (INDEPENDENT_AMBULATORY_CARE_PROVIDER_SITE_OTHER): Payer: Medicare Other | Admitting: *Deleted

## 2022-06-01 DIAGNOSIS — J4550 Severe persistent asthma, uncomplicated: Secondary | ICD-10-CM | POA: Diagnosis not present

## 2022-06-07 ENCOUNTER — Other Ambulatory Visit (HOSPITAL_COMMUNITY): Payer: Self-pay

## 2022-06-09 ENCOUNTER — Other Ambulatory Visit (HOSPITAL_COMMUNITY): Payer: Self-pay

## 2022-06-20 ENCOUNTER — Other Ambulatory Visit (HOSPITAL_COMMUNITY): Payer: Self-pay

## 2022-06-29 ENCOUNTER — Ambulatory Visit: Payer: Medicare Other

## 2022-07-04 ENCOUNTER — Ambulatory Visit (INDEPENDENT_AMBULATORY_CARE_PROVIDER_SITE_OTHER): Payer: Medicare Other | Admitting: Orthopaedic Surgery

## 2022-07-04 ENCOUNTER — Ambulatory Visit (INDEPENDENT_AMBULATORY_CARE_PROVIDER_SITE_OTHER): Payer: Medicare Other

## 2022-07-04 ENCOUNTER — Encounter: Payer: Self-pay | Admitting: Orthopaedic Surgery

## 2022-07-04 VITALS — BP 115/68 | HR 78 | Ht 61.5 in | Wt 190.0 lb

## 2022-07-04 DIAGNOSIS — M4722 Other spondylosis with radiculopathy, cervical region: Secondary | ICD-10-CM | POA: Diagnosis not present

## 2022-07-04 DIAGNOSIS — M542 Cervicalgia: Secondary | ICD-10-CM

## 2022-07-04 NOTE — Progress Notes (Signed)
Office Visit Note   Patient: Kimberly Jenkins           Date of Birth: 07-29-1966           MRN: [id number] Visit Date: 07/04/2022              Requested by: Martin Majestic, FNP [address],  [place] [address] PCP: Martin Majestic, FNP   Assessment & Plan: Visit Diagnoses:  1. Neck pain     Plan: Patient has symptomatic cervical spondylosis with radiculopathy failed conservative treatment.  Positive electrical test.  She needs an MRI for evaluation of cervical radiculopathy and progressive cervical stenosis.  Follow-Up Instructions: No follow-ups on file.  Office follow-up after cervical MRI.  Orders:  Orders Placed This Encounter  Procedures   XR Cervical Spine 2 or 3 views   No orders of the defined types were placed in this encounter.     Procedures: No procedures performed   Clinical Data: No additional findings.   Subjective: Chief Complaint  Patient presents with   Neck - Pain    HPI 56 year old female is seen for neck pain and right index finger numbness.  She has noticed dropping objects past history by Dr. Burney Gauze carpal tunnel release and cubital tunnel.  She has had electrical test done in [place] on 05/02/2022 which shows right upper extremity cervical radiculopathy C6 nerve root.  X-rays taken today shows multilevel cervical spondylosis.  Patient states in the past she had an MRI greater than 10 years ago and told she had some cervical stenosis at that time.  Additionally patient states she has rheumatoid arthritis is on Plaquenil and also has diabetes and fibromyalgia.  She is on chronic pain management hydrocodone 10/325 4 tablets daily and this does not show up on PDMP.  Review of Systems all other systems noncontributory HPI.   Objective: Vital Signs: BP 115/68   Pulse 78   Ht 5' 1.5" (1.562 m)   Wt 190 lb (86.2 kg)   BMI 35.32 kg/m   Physical Exam Constitutional:      Appearance: She is well-developed.   HENT:     Head: Normocephalic.     Right Ear: External ear normal.     Left Ear: External ear normal. There is no impacted cerumen.  Eyes:     Pupils: Pupils are equal, round, and reactive to light.  Neck:     Thyroid: No thyromegaly.     Trachea: No tracheal deviation.  Cardiovascular:     Rate and Rhythm: Normal rate.  Pulmonary:     Effort: Pulmonary effort is normal.  Abdominal:     Palpations: Abdomen is soft.  Musculoskeletal:     Cervical back: No rigidity.  Skin:    General: Skin is warm and dry.  Neurological:     Mental Status: She is alert and oriented to person, place, and time.  Psychiatric:        Behavior: Behavior normal.     Ortho Exam decreased sensation ulnar and long finger.  Positive brachial plexus tenderness on the right positive Spurling on the right negative on the left.  Slow deliberate heel-toe gait.  Negative impingement of the shoulders.  Healed carpal tunnel incision negative Phalen's test.  Specialty Comments: EMG nerve conduction velocities 05/02/2022 listed under media tab's under [facility] on 06/19/2022 page 13 shows increased polyphasics right biceps right ECRL and other examined muscles consistent with cervical radiculopathy particular attention being  directed to the C6 nerve root level.  This was performed by  Tsanvir ChodriMD  Imaging: No results found.   PMFS History: Patient Active Problem List   Diagnosis Date Noted   Cancer of kidney ([place]) 07/11/2021   Dehydration 07/11/2021   Kidney disease 07/11/2021   Hypokalemia 05/11/2020   Costochondritis 05/10/2020   Edema 05/10/2020   Epistaxis 05/10/2020   Fibroma 05/10/2020   Hypercholesterolemia 05/10/2020   Iron deficiency anemia secondary to blood loss (chronic) 05/10/2020   Migraine, unspecified, not intractable, without status migrainosus 05/10/2020   Other chronic diseases of tonsils and adenoids 05/10/2020   Hypertrophy of tonsils 05/10/2020   Asthma 05/07/2020    Fluid overload 05/07/2020   Pain in right knee 08/28/2019   Carpal tunnel syndrome of right wrist 04/17/2019   Ulnar tunnel syndrome of right wrist 04/17/2019   Lesion of ulnar nerve, right upper limb 11/12/2018   Current mild episode of major depressive disorder without prior episode ([place]) 12/12/2017   Abnormal weight loss 11/15/2017   Fibromyalgia 08/27/2016   History of right rotator cuff tear repair 08/27/2016   Diabetes 08/27/2016   Chronic obstructive pulmonary disease ([place]) 08/27/2016   Dyslipidemia 08/27/2016   History of renal cell carcinoma 08/27/2016   Anxiety 08/27/2016   Sleep apnea 08/27/2016   Rheumatoid arthritis of multiple sites with negative rheumatoid factor  07/18/2016   High risk medication use 07/18/2016   Other specified personal risk factors, not elsewhere classified 07/18/2016   Hand pain 07/13/2016   Foot pain 07/13/2016   Elevated sed rate 07/13/2016   Open wound of toe 05/03/2016   Ataxic gait 05/02/2016   Risk for falls 05/02/2016   Tremor 05/02/2016   Vitamin D deficiency 07/08/2015   Chest pain, unspecified 06/23/2015   Mood disorder ([place]) 06/17/2015   Essential hypertension 05/18/2015   Generalized anxiety disorder 05/12/2015   Acquired hypothyroidism 05/05/2015   Severe persistent asthma 04/30/2015   Other allergic rhinitis 04/30/2015   GERD (gastroesophageal reflux disease) 04/30/2015   Chronic kidney disease, stage 2 (mild) 03/02/2015   Obesity 03/01/2015   Chronic post-traumatic headache 02/17/2010   Depression, unspecified 02/17/2010   History of malignant neoplasm of kidney 02/17/2010   Hyperlipidemia associated with type 2 diabetes mellitus ([place]) 02/17/2010   Other amnesia 02/17/2010   Polyosteoarthritis, 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 Overview:  Normal pharmacologic MPS Formatting of this note might be different from the original. Normal pharmacologic MPS Formatting of this note might be different from the original. Formatting of this note might be different from the original. Normal pharmacologic MPS Formatting of this note might be different from the original. Form   Chronic obstructive pulmonary disease ([place]) 08/27/2016   Chronic post-traumatic headache 02/17/2010   Current mild episode of major depressive disorder without prior episode ([place]) 12/12/2017   Dehydration    Depression, unspecified 02/17/2010   Diabetes 08/27/2016   Dyslipidemia 08/27/2016   Edema 05/10/2020   Elevated sed rate 07/13/2016   48 on 05/2016  Epistaxis 05/10/2020   Essential hypertension 05/18/2015   Fibroma 05/10/2020   Fibromyalgia 08/27/2016   Fluid overload 05/07/2020   Foot pain 07/13/2016   Generalized anxiety disorder 05/12/2015   GERD (gastroesophageal reflux disease)    Hand pain 07/13/2016   High risk medication use 07/18/2016   History of malignant neoplasm of kidney 02/17/2010   Formatting of this note might be different from the original. Formatting of this note might be different from the original. Right partial nephrectomy 2008 Formatting of this note might be different from the original. Overview:  Right partial nephrectomy 2008   History of renal cell carcinoma 08/27/2016   Right partial nephrectomy 2008   Hypercholesterolemia 05/10/2020   Formatting of this note might be different from the original. Formatting of this note might be different from the original. 12/07/2014 - TC 151/Trig 199/HDL 47/LDL 64  Lp(a) 10 ( <30)   Hyperlipidemia associated with type 2  diabetes mellitus ([place]) 02/17/2010   Formatting of this note might be different from the original. Overview:  12/07/2014 - TC 151/Trig 199/HDL 47/LDL 64  Lp(a) 10 ( <30)  Overview:  12/07/2014 - TC 151/Trig 199/HDL 47/LDL 64  Lp(a) 10 ( <30) Formatting of this note might be different from the original. 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   Kidney disease    Migraine, unspecified, not intractable, without status migrainosus 05/10/2020   Mood disorder ([place]) 06/17/2015   Obesity 03/01/2015   Open wound of toe 05/03/2016   Other allergic rhinitis 04/30/2015   Other amnesia 02/17/2010   Other chronic diseases of tonsils and adenoids 05/10/2020   Other specified personal risk factors, not elsewhere classified 07/18/2016   Pain in right knee 08/28/2019   Rheumatoid arthritis ([place])    Risk for falls 05/02/2016   Severe persistent asthma 04/30/2015   Sleep apnea 08/27/2016   On CPAP, 2 L oxygen   Sleep disturbance 02/17/2010   Spinal stenosis in cervical region 02/17/2010   Tremor    Vitamin D deficiency 07/08/2015    Family History  Problem Relation Age of Onset   Brain cancer Brother    Asthma Mother    Lung cancer Mother    Asthma Father    Allergic rhinitis Father    COPD Father    Heart attack Father    Arthritis Father    Brain cancer Paternal Uncle    Asthma Maternal Grandmother    Brain cancer Paternal Grandmother    Heart attack Brother     Past Surgical History:  Procedure Laterality Date   [place]     2 times, [place]  2009   COLONOSCOPY W/ POLYPECTOMY  2021   ELBOW SURGERY Right    ulner nerve release   ESOPHAGEAL DILATION  2021   KIDNEY SURGERY Right 2008   1/2 right kidney removed   pellet insertion  03/2021   per patient   SHOULDER  SURGERY Right 2006   TONSILLECTOMY  2011   TOTAL SHOULDER ARTHROPLASTY     TUBAL LIGATION  1986   WRIST SURGERY Right    ulner nerve release   Social History   Occupational History   Not on file  Tobacco Use   Smoking status: Never    Passive exposure: Past  Smokeless tobacco: Never  Vaping Use   Vaping Use: Never used  Substance and Sexual Activity   Alcohol use: No   Drug use: No   Sexual activity: Not on file

## 2022-07-04 NOTE — Addendum Note (Signed)
Addended by: Meyer Cory on: 07/04/2022 09:59 AM   Modules accepted: Orders

## 2022-07-11 NOTE — Progress Notes (Signed)
Office Visit Note  Patient: Kimberly Jenkins             Date of Birth: [birth year]           MRN: [id number]             PCP: Martin Majestic, FNP Referring: Martin Majestic, * Visit Date: 07/25/2022 Occupation: '@GUAROCC'$ @  Subjective:  Neck pain   History of Present Illness: Kimberly Jenkins is a 56 y.o. female with history of seronegative rheumatoid arthritis.  She is currently on Humira 40 mg subcutaneous injections every 14 days (11/02/2016) and Arava 20 mg by mouth daily.  She is tolerating both medications without any side effects and has not missed any doses recently.  She denies any signs or symptoms of a rheumatoid arthritis flare.  Patient reports that she has been experiencing significant discomfort in her C-spine.  She has been wearing a C-spine collar for support and stability.  She is planning on proceeding with cervical spine surgery with Dr. Lorin Mercy in January 2024.  She denies any other joint pain or joint swelling at this time.  She has occasional myalgias and muscle tenderness due to fibromyalgia.  She has had some increased myalgias with weather changes.  She denies any new medical conditions.  She denies any recent or recurrent infections.  She has received the annual flu shot.     Activities of Daily Living:  Patient reports morning stiffness for 10-15 minutes.   Patient Denies nocturnal pain.  Difficulty dressing/grooming: Denies Difficulty climbing stairs: Reports Difficulty getting out of chair: Reports Difficulty using hands for taps, buttons, cutlery, and/or writing: Reports  Review of Systems  Constitutional:  Positive for fatigue.  HENT:  Positive for mouth dryness. Negative for mouth sores.   Eyes:  Negative for dryness.  Respiratory:  Positive for shortness of breath.   Cardiovascular:  Negative for chest pain and palpitations.  Gastrointestinal:  Negative for blood in stool, constipation and diarrhea.  Endocrine: Negative for  increased urination.  Genitourinary:  Negative for involuntary urination.  Musculoskeletal:  Positive for myalgias, muscle weakness, morning stiffness, muscle tenderness and myalgias. Negative for joint pain, gait problem, joint pain and joint swelling.  Skin:  Negative for color change, rash, hair loss and sensitivity to sunlight.  Allergic/Immunologic: Negative for susceptible to infections.  Neurological:  Positive for tremors. Negative for dizziness and headaches.  Hematological:  Negative for swollen glands.  Psychiatric/Behavioral:  Positive for depressed mood. Negative for sleep disturbance. The patient is nervous/anxious.     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 07/19/2022   Herniation of cervical intervertebral disc with radiculopathy 07/19/2022   Other spondylosis with radiculopathy, cervical region 07/04/2022   Cancer of kidney ([place]) 07/11/2021   Dehydration 07/11/2021   Kidney disease 07/11/2021   Hypokalemia 05/11/2020   Costochondritis 05/10/2020   Edema 05/10/2020   Epistaxis 05/10/2020   Fibroma 05/10/2020   Hypercholesterolemia 05/10/2020   Iron deficiency anemia secondary to blood loss (chronic) 05/10/2020   Migraine, unspecified, not intractable, without status migrainosus 05/10/2020   Other chronic diseases of tonsils and adenoids 05/10/2020   Hypertrophy of tonsils 05/10/2020   Asthma 05/07/2020   Fluid overload 05/07/2020   Pain in right knee 08/28/2019   Carpal tunnel syndrome of right wrist 04/17/2019   Ulnar tunnel syndrome of right wrist 04/17/2019   Lesion of ulnar nerve, right upper limb  11/12/2018   Current mild episode of major depressive disorder without prior episode ([place]) 12/12/2017   Abnormal weight loss 11/15/2017   Fibromyalgia 08/27/2016   History of right rotator cuff tear repair 08/27/2016   Diabetes 08/27/2016   Chronic obstructive  pulmonary disease ([place]) 08/27/2016   Dyslipidemia 08/27/2016   History of renal cell carcinoma 08/27/2016   Anxiety 08/27/2016   Sleep apnea 08/27/2016   Rheumatoid arthritis of multiple sites with negative rheumatoid factor  07/18/2016   High risk medication use 07/18/2016   Other specified personal risk factors, not elsewhere classified 07/18/2016   Hand pain 07/13/2016   Foot pain 07/13/2016   Elevated sed rate 07/13/2016   Open wound of toe 05/03/2016   Ataxic gait 05/02/2016   Risk for falls 05/02/2016   Tremor 05/02/2016   Vitamin D deficiency 07/08/2015   Chest pain, unspecified 06/23/2015   Mood disorder ([place]) 06/17/2015   Essential hypertension 05/18/2015   Generalized anxiety disorder 05/12/2015   Acquired hypothyroidism 05/05/2015   Severe persistent asthma 04/30/2015   Other allergic rhinitis 04/30/2015   GERD (gastroesophageal reflux disease) 04/30/2015   Chronic kidney disease, stage 2 (mild) 03/02/2015   Obesity 03/01/2015   Chronic post-traumatic headache 02/17/2010   Depression, unspecified 02/17/2010   History of malignant neoplasm of kidney 02/17/2010   Hyperlipidemia associated with type 2 diabetes mellitus ([place]) 02/17/2010   Other amnesia 02/17/2010   Polyosteoarthritis, 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 Overview:  Normal pharmacologic MPS Formatting of this note might be different from the original. Normal pharmacologic MPS Formatting of this note might be different from the original. Formatting of this  note might be different from the original. Normal pharmacologic MPS Formatting of this note might be different from the original. Form   Chronic obstructive pulmonary disease ([place]) 08/27/2016   Chronic post-traumatic headache 02/17/2010   Current mild episode of major depressive disorder without prior episode ([place]) 12/12/2017   Dehydration    Depression, unspecified 02/17/2010   Diabetes 08/27/2016   Dyslipidemia 08/27/2016   Edema 05/10/2020   Elevated sed rate 07/13/2016   48 on 05/2016   Epistaxis 05/10/2020   Essential hypertension 05/18/2015   Fibroma 05/10/2020   Fibromyalgia 08/27/2016   Fluid overload 05/07/2020   Foot pain 07/13/2016   Generalized anxiety disorder 05/12/2015   GERD (gastroesophageal reflux disease)    Hand pain 07/13/2016   High risk medication use 07/18/2016   History of malignant neoplasm of kidney 02/17/2010   Formatting of this note might be different from the original. Formatting of this note might be different from the original. Right partial nephrectomy 2008 Formatting of this note might be different from the original. Overview:  Right partial nephrectomy 2008   History of renal cell carcinoma 08/27/2016   Right partial nephrectomy 2008   Hypercholesterolemia 05/10/2020   Formatting of this note might be different from the original. Formatting of this note might be different from the original. 12/07/2014 - TC 151/Trig 199/HDL 47/LDL 64  Lp(a) 10 ( <  30)   Hyperlipidemia associated with type 2 diabetes mellitus ([place]) 02/17/2010   Formatting of this note might be different from the original. Overview:  12/07/2014 - TC 151/Trig 199/HDL 47/LDL 64  Lp(a) 10 ( <30)  Overview:  12/07/2014 - TC 151/Trig 199/HDL 47/LDL 64  Lp(a) 10 ( <30) Formatting of this note might be different from the original. 12/07/2014 - TC 151/Trig 199/HDL 47/LDL 64  Lp(a) 10 ( <30)   Hypertrophy of tonsils 05/10/2020   Hypokalemia 05/11/2020   Hypothyroidism    Iron deficiency anemia  secondary to blood loss (chronic) 05/10/2020   Kidney disease    Migraine, unspecified, not intractable, without status migrainosus 05/10/2020   Mood disorder ([place]) 06/17/2015   Obesity 03/01/2015   Open wound of toe 05/03/2016   Other allergic rhinitis 04/30/2015   Other amnesia 02/17/2010   Other chronic diseases of tonsils and adenoids 05/10/2020   Other specified personal risk factors, not elsewhere classified 07/18/2016   Pain in right knee 08/28/2019   Rheumatoid arthritis ([place])    Risk for falls 05/02/2016   Severe persistent asthma 04/30/2015   Sleep apnea 08/27/2016   On CPAP, 2 L oxygen   Sleep disturbance 02/17/2010   Spinal stenosis in cervical region 02/17/2010   Tremor    Vitamin D deficiency 07/08/2015    Family History  Problem Relation Age of Onset   Brain cancer Brother    Asthma Mother    Lung cancer Mother    Asthma Father    Allergic rhinitis Father    COPD Father    Heart attack Father    Arthritis Father    Brain cancer Paternal Uncle    Asthma Maternal Grandmother    Brain cancer Paternal Grandmother    Heart attack Brother    Past Surgical History:  Procedure Laterality Date   [place]     2 times, [place]  2009   COLONOSCOPY W/ POLYPECTOMY  2021   ELBOW SURGERY Right    ulner nerve release   ESOPHAGEAL DILATION  2021   KIDNEY SURGERY Right 2008   1/2 right kidney removed   pellet insertion  03/2021   per patient   SHOULDER SURGERY Right 2006   TONSILLECTOMY  2011   TOTAL SHOULDER ARTHROPLASTY     TUBAL LIGATION  1986   WRIST SURGERY Right    ulner nerve release   Social History   Social History Narrative   Not on file   Immunization History  Administered Date(s) Administered   PFIZER(Purple Top)SARS-COV-2 Vaccination 11/18/2019, 12/09/2019, 08/05/2020     Objective: Vital Signs: BP 128/84 (BP Location: Left Arm, Patient Position: Sitting, Cuff Size: Normal)   Pulse 79    Resp 16   Ht 5' 1.5" (1.562 m)   Wt 189 lb (85.7 kg)   BMI 35.13 kg/m    Physical Exam Vitals and nursing note reviewed.  Constitutional:      Appearance: She is well-developed.  HENT:     Head: Normocephalic and atraumatic.  Eyes:     Conjunctiva/sclera: Conjunctivae normal.  Cardiovascular:     Rate and Rhythm: Normal rate and regular rhythm.     Heart sounds: Normal heart sounds.  Pulmonary:     Effort: Pulmonary effort is normal.     Breath sounds: Normal breath sounds.  Abdominal:     General: Bowel sounds are normal.     Palpations: Abdomen is soft.  Musculoskeletal:  Cervical back: Normal range of motion.  Skin:    General: Skin is warm and dry.     Capillary Refill: Capillary refill takes less than 2 seconds.  Neurological:     Mental Status: She is alert and oriented to person, place, and time.  Psychiatric:        Behavior: Behavior normal.      Musculoskeletal Exam: Patient was wearing a C-spine collar today in the office.  Trapezius muscle tension tenderness bilaterally.  Shoulder joints, elbow joints, wrist joints, MCPs, PIPs, DIPs have good range of motion with no synovitis.  Complete fist formation bilaterally.  Hip joints have good range of motion with no groin pain.  Knee joints have good range of motion with no warmth or effusion.  Ankle joints have good range of motion with no tenderness or synovitis.  No tenderness over MTP joints.  CDAI Exam: CDAI Score: -- Patient Global: 2 mm; Provider Global: 2 mm Swollen: --; Tender: -- Joint Exam 07/25/2022   No joint exam has been documented for this visit   There is currently no information documented on the homunculus. Go to the Rheumatology activity and complete the homunculus joint exam.  Investigation: No additional findings.  Imaging: MR Cervical Spine w/o contrast  Result Date: 07/18/2022 CLINICAL DATA:  Neck pain with chronic spondylosis. Right C6 radiculopathy. EXAM: MRI CERVICAL SPINE  WITHOUT CONTRAST TECHNIQUE: Multiplanar, multisequence MR imaging of the cervical spine was performed. No intravenous contrast was administered. COMPARISON:  None. FINDINGS: Alignment: Reversal of cervical lordosis. Slight C2-3 anterolisthesis Vertebrae: No fracture, evidence of discitis, or bone lesion. Cord: Normal signal and morphology. Posterior Fossa, vertebral arteries, paraspinal tissues: Levels of degenerative cord deformity without cord edema. Disc levels: C2-3: Facet spurring asymmetric to the left with mild anterolisthesis. C3-4: Disc narrowing and bulging with right paracentral upward migrating extrusion flattening the right cord and impinging on the intradural C4 nerve root. Mild left foraminal narrowing from uncovertebral spurring C4-5: Central disc protrusion indenting the ventral cord. Patent foramina C5-6: Disc narrowing and endplate degeneration with left paracentral herniation compressing the left cord. Negative facets. Patent foramina in this patient with history of right C6 radiculopathy. C6-7: Disc narrowing and bulging eccentric to the left foramen where there is uncovertebral spurring. Moderate left foraminal narrowing. Disc contacts the ventral cord. C7-T1:Disc narrowing and circumferential bulging with left foraminal herniation suggested on axial images. Advanced left foraminal impingement. IMPRESSION: 1. Generalized cervical spine degeneration with disc herniations deforming the cord on the right at C3-4, centrally at C4-5, and on the left at C5-6. 2. Left foraminal impingement at C6-7 and especially C7-T1. 3. No right foraminal impingement to correlate with right C6 radiculopathy. Electronically Signed   By: Jorje Guild M.D.   On: 07/18/2022 11:10   XR Cervical Spine 2 or 3 views  Result Date: 07/04/2022 AP lateral cervical spine images demonstrate straightening the cervical spine multilevel disc space narrowing from C3-C7 with anterior posterior spurring facet and uncovertebral  degenerative changes. Impression: Cervical spondylosis multilevel as described above.   Recent Labs: Lab Results  Component Value Date   WBC 4.5 04/24/2022   HGB 12.9 04/24/2022   PLT 225 04/24/2022   NA 140 04/24/2022   K 4.2 04/24/2022   CL 107 04/24/2022   CO2 25 04/24/2022   GLUCOSE 109 (H) 04/24/2022   BUN 10 04/24/2022   CREATININE 1.00 04/24/2022   BILITOT 0.3 04/24/2022   ALKPHOS 135 (H) 06/27/2021   AST 11 04/24/2022   ALT  12 04/24/2022   PROT 6.8 04/24/2022   ALBUMIN 4.1 06/27/2021   CALCIUM 8.8 04/24/2022   GFRAA 79 11/03/2020   QFTBGOLD NEGATIVE 06/26/2017   QFTBGOLDPLUS NEGATIVE 09/29/2021    Speciality Comments: No specialty comments available.  Procedures:  No procedures performed Allergies: Methotrexate, Advair diskus [fluticasone-salmeterol], Aspirin, Ciprofloxacin, Clonidine derivatives, Nystatin, Oxycodone, Serevent [salmeterol], and Cephalexin   Assessment / Plan:     Visit Diagnoses: Rheumatoid arthritis of multiple sites with negative rheumatoid factor: She has no synovitis on examination today.  She has not had any signs or symptoms of a rheumatoid arthritis flare.  She has clinically been doing well on Humira 40 mg subcutaneous injections every 14 days and Arava 20 mg daily.  She is tolerating combination therapy without any side effects or injection site reactions from Humira.  Her morning stiffness has only been lasting 10 to 15 minutes daily.  She has not been experiencing any nocturnal pain.  No difficulty with ADLs.  She will be scheduling C-spine surgery with Dr. Lorin Mercy in January 2024.  She was encouraged to hold Humira and Arava at least 2 weeks prior to surgery.  She will require clearance by Dr. Lorin Mercy during the postoperative period to restart on Humira and Arava if there is no signs of delayed healing or infection.  She voiced understanding.  She was advised to notify us if she develops signs or symptoms of a flare during the gap in therapy.  She  will follow-up in the office in 3 months or sooner if needed.  High risk medication use - Humira 40 mg subcutaneous injections every 14 days (11/02/2016), Arava 20 mg by mouth daily (12/13/2016) (inadequate response to Enbrel, side effects from MTX). CBC and CMP updated on 04/24/22. Orders for CBC and CMP released today.  Her next lab work will be due in February and every 3 months.  TB gold negative 09/29/21. Future order for TB gold placed today.  She has not had any recent or recurrent infections.  Discussed the importance of holding humira and arava if she develops signs or symptoms of an infection and to resume once the infection has completely cleared.   She received the annual flu shot.  - Plan: CBC with Differential/Platelet, COMPLETE METABOLIC PANEL WITH GFR, QuantiFERON-TB Gold Plus  Screening for tuberculosis -Future order for TB Gold placed today.  Plan: QuantiFERON-TB Gold Plus  Fibromyalgia: She continues to experience intermittent myalgias and muscle tenderness due to fibromyalgia.  She has had some increased myalgias with recent weather changes.  She takes Flexeril 10 mg twice daily as needed for muscle spasms and remains on gabapentin as prescribed.  She is taking Norco as needed for pain relief.  Trochanteric bursitis, left hip: She has tenderness palpation over bilateral trochanteric bursa.  Encourage patient to perform stretching exercises daily.  Primary insomnia: She has not been experiencing any nocturnal pain.  Positive ANA (antinuclear antibody) - She has no clinical features of systemic lupus.  Osteopenia of multiple sites - DEXA updated on 12/12/21: AP spine BMD 0.883 with T score -1.5.  Due to update DEXA April 2025.  She remains on vitamin D 50,000 units once weekly.  Vitamin D deficiency: She is taking vitamin D 50,000 units once weekly.  History of gastroesophageal reflux (GERD)  Dyslipidemia  History of renal cell carcinoma  History of COPD  History of  diabetes mellitus  History of hypothyroidism  Elevated hemoglobin A1c    Orders: Orders Placed This Encounter  Procedures  CBC with Differential/Platelet   COMPLETE METABOLIC PANEL WITH GFR   QuantiFERON-TB Gold Plus   Meds ordered this encounter  Medications   leflunomide (ARAVA) 10 MG tablet    Sig: Take 2 tablets (20 mg total) by mouth daily.    Dispense:  180 tablet    Refill:  0    Follow-Up Instructions: Return in about 3 months (around 10/25/2022) for Rheumatoid arthritis, Fibromyalgia.   Ofilia Neas, PA-C  Note - This record has been created using Dragon software.  Chart creation errors have been sought, but may not always  have been located. Such creation errors do not reflect on  the standard of medical care.

## 2022-07-12 ENCOUNTER — Other Ambulatory Visit (HOSPITAL_COMMUNITY): Payer: Self-pay

## 2022-07-12 ENCOUNTER — Other Ambulatory Visit: Payer: Self-pay | Admitting: Physician Assistant

## 2022-07-12 MED ORDER — HUMIRA PEN 40 MG/0.8ML ~~LOC~~ PNKT
PEN_INJECTOR | SUBCUTANEOUS | 0 refills | Status: DC
  Filled 2022-07-12: qty 2, 28d supply, fill #0
  Filled 2022-08-09: qty 2, 28d supply, fill #1

## 2022-07-12 NOTE — Telephone Encounter (Signed)
Next Visit: 07/25/2022  Last Visit: 04/24/2022  Last Fill: 04/24/2022  LX:[id number] arthritis of multiple sites with negative rheumatoid factor   Current Dose per office note 04/24/2022: Humira 40 mg subcutaneous injections every 14 days   Labs: 04/24/2022 Glucose is 109. Rest of CMP WNL.  CBC WNL.   TB Gold: 09/29/2021 Neg    Okay to refill Humira?

## 2022-07-12 NOTE — Telephone Encounter (Signed)
Patient has an upcoming appointment on 07/25/2022. Patient to update lab work at that appointment.

## 2022-07-12 NOTE — Telephone Encounter (Signed)
Please advise patient to update lab work this month.

## 2022-07-13 ENCOUNTER — Ambulatory Visit (INDEPENDENT_AMBULATORY_CARE_PROVIDER_SITE_OTHER): Payer: Medicare Other | Admitting: *Deleted

## 2022-07-13 DIAGNOSIS — J4550 Severe persistent asthma, uncomplicated: Secondary | ICD-10-CM | POA: Diagnosis not present

## 2022-07-15 ENCOUNTER — Other Ambulatory Visit: Payer: Self-pay | Admitting: Allergy and Immunology

## 2022-07-17 ENCOUNTER — Ambulatory Visit
Admission: RE | Admit: 2022-07-17 | Discharge: 2022-07-17 | Disposition: A | Discharge status: Home / Self Care | Payer: Medicare Other | Source: Ambulatory Visit | Attending: Orthopaedic Surgery | Admitting: Orthopaedic Surgery

## 2022-07-17 ENCOUNTER — Other Ambulatory Visit (HOSPITAL_COMMUNITY): Payer: Self-pay

## 2022-07-17 DIAGNOSIS — M542 Cervicalgia: Secondary | ICD-10-CM

## 2022-07-17 DIAGNOSIS — M4722 Other spondylosis with radiculopathy, cervical region: Secondary | ICD-10-CM

## 2022-07-19 ENCOUNTER — Ambulatory Visit (INDEPENDENT_AMBULATORY_CARE_PROVIDER_SITE_OTHER): Payer: Medicare Other | Admitting: Orthopaedic Surgery

## 2022-07-19 ENCOUNTER — Encounter: Payer: Self-pay | Admitting: Orthopaedic Surgery

## 2022-07-19 VITALS — BP 132/77 | HR 88 | Ht 61.5 in | Wt 190.0 lb

## 2022-07-19 DIAGNOSIS — M5020 Other cervical disc displacement, unspecified cervical region: Secondary | ICD-10-CM | POA: Insufficient documentation

## 2022-07-19 DIAGNOSIS — M4722 Other spondylosis with radiculopathy, cervical region: Secondary | ICD-10-CM | POA: Diagnosis not present

## 2022-07-19 DIAGNOSIS — M5010 Cervical disc disorder with radiculopathy, unspecified cervical region: Secondary | ICD-10-CM | POA: Diagnosis not present

## 2022-07-19 DIAGNOSIS — M5000 Cervical disc disorder with myelopathy, unspecified cervical region: Secondary | ICD-10-CM | POA: Diagnosis not present

## 2022-07-19 NOTE — Progress Notes (Signed)
Office Visit Note   Patient: Kimberly Jenkins           Date of Birth: [birth year]           MRN: [id number] Visit Date: 07/19/2022              Requested by: Martin Majestic, FNP [place],  [place] [address] PCP: Martin Majestic, FNP   Assessment & Plan: Visit Diagnoses:  1. 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 Other spondylosis with radiculopathy, cervical region   3. Herniation of cervical intervertebral disc with radiculopathy     Plan: We reviewed patient's MRI scan she large disc herniation with right side cord compression with extruded fragment flattening the cord and C4 nerve root on the right.  Disc protrusion compressing the cord at C4-5 right paracentral and then left paracentral herniation with cord compression on the left at C5-6.  We discussed options she does have some mild disease at C6-7 and C7-T1 worse on the left than right.  Plan would be three-level cervical fusion C3-4, C4-5, C5-6 with removal of disc herniations compression of the cord, allograft and plate.  Overnight stay postop collar discussed.  Risks of surgery discussed including dysphagia, dysphonia, pseudoarthrosis, possible need for posterior fusion if she did not heal anteriorly.  Patient states she has family questions plans and wants to wait till after the holidays for the surgery. I discussed with her risk well following her neck injury.  We will place her in a soft collar.  If she develops any progression of weakness or gait disturbance I have asked her to call me promptly.  She understands she has compression of the cord without myelopathic changes but if the disc herniation progresses she could have permanent damage that would not be reversible.  We will seek pulmonary clearance.  Questions were elicited and answered she understands request to proceed.  Follow-Up Instructions: No follow-ups on file.   Orders:  No orders of the  defined types were placed in this encounter.  No orders of the defined types were placed in this encounter.     Procedures: No procedures performed   Clinical Data: No additional findings.   Subjective: Chief Complaint  Patient presents with   Neck - Pain, Follow-up    MRI review    HPI 56 year old female returns with ongoing problems with right arm numbness weakness gait problems and falling.  Past diagnosis cervical stenosis 10 years ago by MRI.  She has had carpal tunnel releases also cubital tunnel release.  Electrical test 05/02/2022 showed changes consistent with radiculopathy and not peripheral nerve compression and this test was done in [place].  Patient has been on chronic pain management Norco 10/325 taking 4 tablets daily which has not changed.  She has been told she needs to use a cane and has had balance problems.  MRI scan has been obtained and is available for review below.  Patient was never smoker but has been diagnosed with asthma and COPD and sees Dr. Arnell Sieving in [place].  She uses 2 L nasal oxygen at night and has sleep apnea.  No dyspnea during the day reported.  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  Positive for diabetes with A1c 5.6.Marland Kitchen  Peers rotator cuff repair.  For kidney cancer and previous carpal tunnel surgeries and ulnar nerve surgery by Dr. Burney Gauze.  All other systems noncontributory to HPI.   Objective:  Vital Signs: BP 132/77   Pulse 88   Ht 5' 1.5" (1.562 m)   Wt 190 lb (86.2 kg)   BMI 35.32 kg/m   Physical Exam Constitutional:      Appearance: She is well-developed.  HENT:     Head: Normocephalic.     Right Ear: External ear normal.     Left Ear: External ear normal. There is no impacted cerumen.  Eyes:     Pupils: Pupils are equal, round, and reactive to light.  Neck:     Thyroid: No thyromegaly.     Trachea: No tracheal deviation.  Cardiovascular:     Rate and Rhythm: Normal rate.  Pulmonary:      Effort: Pulmonary effort is normal.  Abdominal:     Palpations: Abdomen is soft.  Musculoskeletal:     Cervical back: No rigidity.  Skin:    General: Skin is warm and dry.  Neurological:     Mental Status: She is alert and oriented to person, place, and time.  Psychiatric:        Behavior: Behavior normal.     Ortho Exam patient has slow gait slightly wide-based decreased balance with turning and changing direction.  Decreased sensation C6 distribution right hand.  Positive brachial plexus tenderness worse on the right than the left Sp[place]ling on the right.  Positive Lhermitte.  Lower extremity reflexes are 3-4+ knee and ankle jerk.  Specialty Comments:  Specialty Comments: EMG nerve conduction velocities 05/02/2022 listed under media tab's under [facility] on 06/19/2022 page 13 shows increased polyphasics right biceps right ECRL and other examined muscles consistent with cervical radiculopathy particular attention being directed to the C6 nerve root level.  This was performed by  Tsanvir ChodriMD    Imaging: CLINICAL DATA:  Neck pain with chronic spondylosis. Right C6 radiculopathy.   EXAM: MRI CERVICAL SPINE WITHOUT CONTRAST   TECHNIQUE: Multiplanar, multisequence MR imaging of the cervical spine was performed. No intravenous contrast was administered.   COMPARISON:  None.   FINDINGS: Alignment: Reversal of cervical lordosis. Slight C2-3 anterolisthesis   Vertebrae: No fracture, evidence of discitis, or bone lesion.   Cord: Normal signal and morphology.   Posterior Fossa, vertebral arteries, paraspinal tissues: Levels of degenerative cord deformity without cord edema.   Disc levels:   C2-3: Facet spurring asymmetric to the left with mild anterolisthesis.   C3-4: Disc narrowing and bulging with right paracentral upward migrating extrusion flattening the right cord and impinging on the intradural C4 nerve root. Mild left foraminal narrowing  from uncovertebral spurring   C4-5: Central disc protrusion indenting the ventral cord. Patent foramina   C5-6: Disc narrowing and endplate degeneration with left paracentral herniation compressing the left cord. Negative facets. Patent foramina in this patient with history of right C6 radiculopathy.   C6-7: Disc narrowing and bulging eccentric to the left foramen where there is uncovertebral spurring. Moderate left foraminal narrowing. Disc contacts the ventral cord.   C7-T1:Disc narrowing and circumferential bulging with left foraminal herniation suggested on axial images. Advanced left foraminal impingement.   IMPRESSION: 1. Generalized cervical spine degeneration with disc herniations deforming the cord on the right at C3-4, centrally at C4-5, and on the left at C5-6. 2. Left foraminal impingement at C6-7 and especially C7-T1. 3. No right foraminal impingement to correlate with right C6 radiculopathy.     Electronically Signed   By: Jorje Guild M.D.   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 07/19/2022   Herniation of cervical intervertebral disc with radiculopathy 07/19/2022   Other spondylosis with radiculopathy, cervical region 07/04/2022   Cancer of kidney ([place]) 07/11/2021   Dehydration 07/11/2021   Kidney disease 07/11/2021   Hypokalemia 05/11/2020   Costochondritis 05/10/2020   Edema 05/10/2020   Epistaxis 05/10/2020   Fibroma 05/10/2020   Hypercholesterolemia 05/10/2020   Iron deficiency anemia secondary to blood loss (chronic) 05/10/2020   Migraine, unspecified, not intractable, without status migrainosus 05/10/2020   Other chronic diseases of tonsils and adenoids 05/10/2020   Hypertrophy of tonsils 05/10/2020   Asthma 05/07/2020   Fluid overload 05/07/2020   Pain in right knee 08/28/2019   Carpal tunnel syndrome of right wrist  04/17/2019   Ulnar tunnel syndrome of right wrist 04/17/2019   Lesion of ulnar nerve, right upper limb 11/12/2018   Current mild episode of major depressive disorder without prior episode ([place]) 12/12/2017   Abnormal weight loss 11/15/2017   Fibromyalgia 08/27/2016   History of right rotator cuff tear repair 08/27/2016   Diabetes 08/27/2016   Chronic obstructive pulmonary disease ([place]) 08/27/2016   Dyslipidemia 08/27/2016   History of renal cell carcinoma 08/27/2016   Anxiety 08/27/2016   Sleep apnea 08/27/2016   Rheumatoid arthritis of multiple sites with negative rheumatoid factor  07/18/2016   High risk medication use 07/18/2016   Other specified personal risk factors, not elsewhere classified 07/18/2016   Hand pain 07/13/2016   Foot pain 07/13/2016   Elevated sed rate 07/13/2016   Open wound of toe 05/03/2016   Ataxic gait 05/02/2016   Risk for falls 05/02/2016   Tremor 05/02/2016   Vitamin D deficiency 07/08/2015   Chest pain, unspecified 06/23/2015   Mood disorder ([place]) 06/17/2015   Essential hypertension 05/18/2015   Generalized anxiety disorder 05/12/2015   Acquired hypothyroidism 05/05/2015   Severe persistent asthma 04/30/2015   Other allergic rhinitis 04/30/2015   GERD (gastroesophageal reflux disease) 04/30/2015   Chronic kidney disease, stage 2 (mild) 03/02/2015   Obesity 03/01/2015   Chronic post-traumatic headache 02/17/2010   Depression, unspecified 02/17/2010   History of malignant neoplasm of kidney 02/17/2010   Hyperlipidemia associated with type 2 diabetes mellitus ([place]) 02/17/2010   Other amnesia 02/17/2010   Polyosteoarthritis, 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 Overview:  Normal pharmacologic MPS Formatting of this note might be different from the original. Normal pharmacologic MPS Formatting of this note might be different from the original. Formatting of this note might be different from the original. Normal pharmacologic MPS Formatting of this note might be different from the original. Form   Chronic obstructive pulmonary disease ([place]) 08/27/2016   Chronic post-traumatic headache 02/17/2010   Current mild episode of major depressive disorder without prior episode ([place]) 12/12/2017   Dehydration    Depression, unspecified 02/17/2010   Diabetes 08/27/2016   Dyslipidemia 08/27/2016   Edema 05/10/2020   Elevated sed rate 07/13/2016   48 on 05/2016  Epistaxis 05/10/2020   Essential hypertension 05/18/2015   Fibroma 05/10/2020   Fibromyalgia 08/27/2016   Fluid overload 05/07/2020   Foot pain 07/13/2016   Generalized anxiety disorder 05/12/2015   GERD (gastroesophageal reflux disease)    Hand pain 07/13/2016   High risk medication use 07/18/2016   History of malignant neoplasm of kidney 02/17/2010   Formatting of this note might be different from the original. Formatting of this note might be different from the original. Right partial nephrectomy 2008 Formatting of this note might be different from the original. Overview:  Right partial nephrectomy 2008   History of renal cell carcinoma 08/27/2016   Right partial nephrectomy 2008   Hypercholesterolemia 05/10/2020   Formatting of this note might be different from the original. Formatting of this note might be different from the original. 12/07/2014 - TC 151/Trig 199/HDL 47/LDL 64  Lp(a) 10 ( <30)   Hyperlipidemia associated with type 2 diabetes mellitus ([place]) 02/17/2010   Formatting of this note might be different from the original.  Overview:  12/07/2014 - TC 151/Trig 199/HDL 47/LDL 64  Lp(a) 10 ( <30)  Overview:  12/07/2014 - TC 151/Trig 199/HDL 47/LDL 64  Lp(a) 10 ( <30) Formatting of this note might be different from the original. 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   Kidney disease    Migraine, unspecified, not intractable, without status migrainosus 05/10/2020   Mood disorder ([place]) 06/17/2015   Obesity 03/01/2015   Open wound of toe 05/03/2016   Other allergic rhinitis 04/30/2015   Other amnesia 02/17/2010   Other chronic diseases of tonsils and adenoids 05/10/2020   Other specified personal risk factors, not elsewhere classified 07/18/2016   Pain in right knee 08/28/2019   Rheumatoid arthritis ([place])    Risk for falls 05/02/2016   Severe persistent asthma 04/30/2015   Sleep apnea 08/27/2016   On CPAP, 2 L oxygen   Sleep disturbance 02/17/2010   Spinal stenosis in cervical region 02/17/2010   Tremor    Vitamin D deficiency 07/08/2015    Family History  Problem Relation Age of Onset   Brain cancer Brother    Asthma Mother    Lung cancer Mother    Asthma Father    Allergic rhinitis Father    COPD Father    Heart attack Father    Arthritis Father    Brain cancer Paternal Uncle    Asthma Maternal Grandmother    Brain cancer Paternal Grandmother    Heart attack Brother     Past Surgical History:  Procedure Laterality Date   [place]     2 times, [place]  2009   COLONOSCOPY W/ POLYPECTOMY  2021   ELBOW SURGERY Right    ulner nerve release   ESOPHAGEAL DILATION  2021   KIDNEY SURGERY Right 2008   1/2 right kidney removed   pellet insertion  03/2021   per patient   SHOULDER SURGERY Right 2006   TONSILLECTOMY  2011   TOTAL SHOULDER ARTHROPLASTY     TUBAL LIGATION  1986    WRIST SURGERY Right    ulner nerve release   Social History   Occupational History   Not on file  Tobacco Use   Smoking status: Never    Passive exposure: Past  Smokeless tobacco: Never  Vaping Use   Vaping Use: Never used  Substance and Sexual Activity   Alcohol use: No   Drug use: No   Sexual activity: Not on file

## 2022-07-25 ENCOUNTER — Ambulatory Visit: Payer: Medicare Other | Attending: Physician Assistant | Admitting: Physician Assistant

## 2022-07-25 ENCOUNTER — Encounter: Payer: Self-pay | Admitting: Physician Assistant

## 2022-07-25 ENCOUNTER — Other Ambulatory Visit (HOSPITAL_COMMUNITY): Payer: Self-pay

## 2022-07-25 VITALS — BP 128/84 | HR 79 | Resp 16 | Ht 61.5 in | Wt 189.0 lb

## 2022-07-25 DIAGNOSIS — Z79899 Other long term (current) drug therapy: Secondary | ICD-10-CM

## 2022-07-25 DIAGNOSIS — M0609 Rheumatoid arthritis without rheumatoid factor, multiple sites: Secondary | ICD-10-CM

## 2022-07-25 DIAGNOSIS — Z85528 Personal history of other malignant neoplasm of kidney: Secondary | ICD-10-CM

## 2022-07-25 DIAGNOSIS — Z8719 Personal history of other diseases of the digestive system: Secondary | ICD-10-CM

## 2022-07-25 DIAGNOSIS — R7689 Other specified abnormal immunological findings in serum: Secondary | ICD-10-CM

## 2022-07-25 DIAGNOSIS — M797 Fibromyalgia: Secondary | ICD-10-CM | POA: Diagnosis not present

## 2022-07-25 DIAGNOSIS — R7309 Other abnormal glucose: Secondary | ICD-10-CM

## 2022-07-25 DIAGNOSIS — M8589 Other specified disorders of bone density and structure, multiple sites: Secondary | ICD-10-CM

## 2022-07-25 DIAGNOSIS — Z8639 Personal history of other endocrine, nutritional and metabolic disease: Secondary | ICD-10-CM

## 2022-07-25 DIAGNOSIS — Z111 Encounter for screening for respiratory tuberculosis: Secondary | ICD-10-CM

## 2022-07-25 DIAGNOSIS — M7062 Trochanteric bursitis, left hip: Secondary | ICD-10-CM

## 2022-07-25 DIAGNOSIS — E559 Vitamin D deficiency, unspecified: Secondary | ICD-10-CM

## 2022-07-25 DIAGNOSIS — E785 Hyperlipidemia, unspecified: Secondary | ICD-10-CM

## 2022-07-25 DIAGNOSIS — F5101 Primary insomnia: Secondary | ICD-10-CM

## 2022-07-25 DIAGNOSIS — R768 Other specified abnormal immunological findings in serum: Secondary | ICD-10-CM

## 2022-07-25 DIAGNOSIS — Z8709 Personal history of other diseases of the respiratory system: Secondary | ICD-10-CM

## 2022-07-25 MED ORDER — LEFLUNOMIDE 10 MG PO TABS
20.0000 mg | ORAL_TABLET | Freq: Every day | ORAL | 0 refills | Status: DC
  Filled 2022-07-25 – 2022-11-17 (×2): qty 180, 90d supply, fill #0

## 2022-07-25 NOTE — Progress Notes (Signed)
CBC WNL

## 2022-07-26 LAB — CBC WITH DIFFERENTIAL/PLATELET
Absolute Monocytes: 518 cells/uL (ref 200–950)
Basophils Absolute: 38 cells/uL (ref 0–200)
Basophils Relative: 0.8 %
Eosinophils Absolute: 29 cells/uL (ref 15–500)
Eosinophils Relative: 0.6 %
HCT: 40.6 % (ref 35.0–45.0)
Hemoglobin: 13.4 g/dL (ref 11.7–15.5)
Lymphs Abs: 1608 cells/uL (ref 850–3900)
MCH: 28.6 pg (ref 27.0–33.0)
MCHC: 33 g/dL (ref 32.0–36.0)
MCV: 86.8 fL (ref 80.0–100.0)
MPV: 9.9 fL (ref 7.5–12.5)
Monocytes Relative: 10.8 %
Neutro Abs: 2606 cells/uL (ref 1500–7800)
Neutrophils Relative %: 54.3 %
Platelets: 247 10*3/uL (ref 140–400)
RBC: 4.68 10*6/uL (ref 3.80–5.10)
RDW: 13.2 % (ref 11.0–15.0)
Total Lymphocyte: 33.5 %
WBC: 4.8 10*3/uL (ref 3.8–10.8)

## 2022-07-26 LAB — COMPLETE METABOLIC PANEL WITH GFR
AG Ratio: 1.3 (calc) (ref 1.0–2.5)
ALT: 13 U/L (ref 6–29)
AST: 13 U/L (ref 10–35)
Albumin: 4 g/dL (ref 3.6–5.1)
Alkaline phosphatase (APISO): 86 U/L (ref 37–153)
BUN: 10 mg/dL (ref 7–25)
CO2: 25 mmol/L (ref 20–32)
Calcium: 9 mg/dL (ref 8.6–10.4)
Chloride: 107 mmol/L (ref 98–110)
Creat: 0.88 mg/dL (ref 0.50–1.03)
Globulin: 3.1 g/dL (calc) (ref 1.9–3.7)
Glucose, Bld: 135 mg/dL (ref 65–139)
Potassium: 4 mmol/L (ref 3.5–5.3)
Sodium: 140 mmol/L (ref 135–146)
Total Bilirubin: 0.3 mg/dL (ref 0.2–1.2)
Total Protein: 7.1 g/dL (ref 6.1–8.1)
eGFR: 76 mL/min/{1.73_m2} (ref >=60)

## 2022-07-26 NOTE — Progress Notes (Signed)
CMP WNL

## 2022-08-09 ENCOUNTER — Other Ambulatory Visit: Payer: Self-pay

## 2022-08-10 ENCOUNTER — Ambulatory Visit (INDEPENDENT_AMBULATORY_CARE_PROVIDER_SITE_OTHER): Payer: Medicare Other | Admitting: *Deleted

## 2022-08-10 DIAGNOSIS — J4550 Severe persistent asthma, uncomplicated: Secondary | ICD-10-CM

## 2022-08-11 ENCOUNTER — Other Ambulatory Visit: Payer: Self-pay

## 2022-08-15 ENCOUNTER — Telehealth: Payer: Self-pay | Admitting: Pharmacist

## 2022-08-15 NOTE — Telephone Encounter (Signed)
Submitted a Prior Authorization RENEWAL request to [facility] for RINVOQ via CoverMyMeds. Will update once we receive a response.  Key: [id number]  Knox Saliva, PharmD, MPH, BCPS, CPP Clinical Pharmacist (Rheumatology and Pulmonology)

## 2022-08-18 NOTE — Telephone Encounter (Signed)
Received notification from [facility] regarding a prior authorization for [place]. Authorization has been APPROVED from 08/15/22 to 08/28/2023. Approval letter sent to scan center.  Patient can continue to fill through [place]: [phone]   Authorization # [id number] Phone # [phone]  Knox Saliva, PharmD, MPH, BCPS, CPP Clinical Pharmacist (Rheumatology and Pulmonology)

## 2022-08-22 ENCOUNTER — Other Ambulatory Visit (HOSPITAL_COMMUNITY): Payer: Self-pay

## 2022-08-25 ENCOUNTER — Other Ambulatory Visit (HOSPITAL_COMMUNITY): Payer: Self-pay

## 2022-09-07 ENCOUNTER — Ambulatory Visit (INDEPENDENT_AMBULATORY_CARE_PROVIDER_SITE_OTHER): Payer: Medicare Other

## 2022-09-07 ENCOUNTER — Other Ambulatory Visit: Payer: Self-pay | Admitting: Family Medicine

## 2022-09-07 DIAGNOSIS — N6489 Other specified disorders of breast: Secondary | ICD-10-CM

## 2022-09-07 DIAGNOSIS — J4550 Severe persistent asthma, uncomplicated: Secondary | ICD-10-CM

## 2022-09-11 ENCOUNTER — Ambulatory Visit
Admission: RE | Admit: 2022-09-11 | Discharge: 2022-09-11 | Disposition: A | Discharge status: Home / Self Care | Payer: 59 | Source: Ambulatory Visit | Attending: Family Medicine | Admitting: Family Medicine

## 2022-09-11 DIAGNOSIS — N6489 Other specified disorders of breast: Secondary | ICD-10-CM

## 2022-09-12 ENCOUNTER — Other Ambulatory Visit (HOSPITAL_COMMUNITY): Payer: Self-pay

## 2022-09-12 MED ORDER — HUMIRA (2 PEN) 40 MG/0.8ML ~~LOC~~ AJKT
AUTO-INJECTOR | SUBCUTANEOUS | 0 refills | Status: DC
  Filled 2022-09-15: qty 2, 28d supply, fill #0

## 2022-09-13 ENCOUNTER — Other Ambulatory Visit (HOSPITAL_COMMUNITY): Payer: Self-pay

## 2022-09-15 ENCOUNTER — Other Ambulatory Visit: Payer: Self-pay

## 2022-09-15 ENCOUNTER — Other Ambulatory Visit (HOSPITAL_COMMUNITY): Payer: Self-pay

## 2022-09-20 ENCOUNTER — Other Ambulatory Visit: Payer: Self-pay

## 2022-09-21 ENCOUNTER — Other Ambulatory Visit: Payer: Self-pay

## 2022-09-21 ENCOUNTER — Other Ambulatory Visit (HOSPITAL_COMMUNITY): Payer: Self-pay

## 2022-09-25 ENCOUNTER — Other Ambulatory Visit (HOSPITAL_COMMUNITY): Payer: Self-pay

## 2022-09-28 ENCOUNTER — Ambulatory Visit (INDEPENDENT_AMBULATORY_CARE_PROVIDER_SITE_OTHER): Payer: 59 | Admitting: Physician Assistant

## 2022-09-28 ENCOUNTER — Encounter: Payer: Self-pay | Admitting: Physician Assistant

## 2022-09-28 VITALS — BP 126/79 | HR 87 | Temp 98.9°F | Ht 61.5 in | Wt 189.0 lb

## 2022-09-28 DIAGNOSIS — M4802 Spinal stenosis, cervical region: Secondary | ICD-10-CM

## 2022-09-28 NOTE — H&P (Signed)
Kimberly Jenkins is an 56 y.o. female.   Chief Complaint: Neck pain HPI:  Subjective:     Chief Complaint  Patient presents with   Neck - Pain, Follow-up      MRI review      HPI 56-year-old female returns with ongoing problems with right arm numbness weakness gait problems and falling.  Past diagnosis cervical stenosis 10 years ago by MRI.  She has had carpal tunnel releases also cubital tunnel release.  Electrical test 05/02/2022 showed changes consistent with radiculopathy and not peripheral nerve compression and this test was done in [place].  Patient has been on chronic pain management Norco 10/325 taking 4 tablets daily which has not changed.  She has been told she needs to use a cane and has had balance problems.  MRI scan has been obtained and is available for review below.  Patient was never smoker but has been diagnosed with asthma and COPD and sees Dr. Chodra in [place].  She uses 2 L nasal oxygen at night and has sleep apnea.  No dyspnea during the day reported.  Last weekend she was put on a antibiotic for a sinus infection.  She appears well today and has clear lungs.  Given her surgery is still a week out I do not foresee this being a problem.  She has gotten pulmonary clearance  Past Medical History:  Diagnosis Date   Abnormal weight loss 11/15/2017   Acquired hypothyroidism 05/05/2015   Allergic rhinitis    Anxiety 08/27/2016   With depression   Asthma    Ataxic gait 05/02/2016   Cancer of kidney ([facility])    Carpal tunnel syndrome of right wrist 04/17/2019   Chest pain, unspecified 06/23/2015   Formatting of this note might be different from the original. Overview:  Normal pharmacologic MPS Formatting of this note might be different from the original. Normal pharmacologic MPS Formatting of this note might be different from the original. Formatting of this note might be different from the original. Normal pharmacologic MPS Formatting of this note might be different from the  original. Form   Chronic obstructive pulmonary disease (HCC) 08/27/2016   Chronic post-traumatic headache 02/17/2010   Current mild episode of major depressive disorder without prior episode (HCC) 12/12/2017   Dehydration    Depression, unspecified 02/17/2010   Diabetes 08/27/2016   Dyslipidemia 08/27/2016   Edema 05/10/2020   Elevated sed rate 07/13/2016   48 on 05/2016   Epistaxis 05/10/2020   Essential hypertension 05/18/2015   Fibroma 05/10/2020   Fibromyalgia 08/27/2016   Fluid overload 05/07/2020   Foot pain 07/13/2016   Generalized anxiety disorder 05/12/2015   GERD (gastroesophageal reflux disease)    Hand pain 07/13/2016   High risk medication use 07/18/2016   History of malignant neoplasm of kidney 02/17/2010   Formatting of this note might be different from the original. Formatting of this note might be different from the original. Right partial nephrectomy 2008 Formatting of this note might be different from the original. Overview:  Right partial nephrectomy 2008   History of renal cell carcinoma 08/27/2016   Right partial nephrectomy 2008   Hypercholesterolemia 05/10/2020   Formatting of this note might be different from the original. Formatting of this note might be different from the original. 12/07/2014 - TC 151/Trig 199/HDL 47/LDL 64  Lp(a) 10 ( <30)   Hyperlipidemia associated with type 2 diabetes mellitus (HCC) 02/17/2010   Formatting of this note might be different from the original. Overview:    12/07/2014 - TC 151/Trig 199/HDL 47/LDL 64  Lp(a) 10 ( <30)  Overview:  12/07/2014 - TC 151/Trig 199/HDL 47/LDL 64  Lp(a) 10 ( <30) Formatting of this note might be different from the original. 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   Kidney disease    Migraine, unspecified, not intractable, without status  migrainosus 05/10/2020   Mood disorder (HCC) 06/17/2015   Obesity 03/01/2015   Open wound of toe 05/03/2016   Other allergic rhinitis 04/30/2015   Other amnesia 02/17/2010   Other chronic diseases of tonsils and adenoids 05/10/2020   Other specified personal risk factors, not elsewhere classified 07/18/2016   Pain in right knee 08/28/2019   Rheumatoid arthritis (HCC)    Risk for falls 05/02/2016   Severe persistent asthma 04/30/2015   Sleep apnea 08/27/2016   On CPAP, 2 L oxygen   Sleep disturbance 02/17/2010   Spinal stenosis in cervical region 02/17/2010   Tremor    Vitamin D deficiency 07/08/2015    Past Surgical History:  Procedure Laterality Date   APPENDECTOMY  1983   CESAREAN SECTION     2 times, 1986 & 1983   CHOLECYSTECTOMY  2009   COLONOSCOPY W/ POLYPECTOMY  2021   ELBOW SURGERY Right    ulner nerve release   ESOPHAGEAL DILATION  2021   KIDNEY SURGERY Right 2008   1/2 right kidney removed   pellet insertion  03/2021   per patient   SHOULDER SURGERY Right 2006   TONSILLECTOMY  2011   TOTAL SHOULDER ARTHROPLASTY     TUBAL LIGATION  1986   WRIST SURGERY Right    ulner nerve release    Family History  Problem Relation Age of Onset   Asthma Mother    Lung cancer Mother    Asthma Father    Allergic rhinitis Father    COPD Father    Heart attack Father    Arthritis Father    Brain cancer Paternal Uncle    Asthma Maternal Grandmother    Brain cancer Paternal Grandmother    Breast cancer Cousin    Breast cancer Cousin    Breast cancer Cousin    Brain cancer Brother    Heart attack Brother    Social History:  reports that she has never smoked. She has been exposed to tobacco smoke. She has never used smokeless tobacco. She reports that she does not drink alcohol and does not use drugs.  Allergies:  Allergies  Allergen Reactions   Methotrexate Diarrhea   Advair Diskus [Fluticasone-Salmeterol]    Aspirin    Ciprofloxacin    Clonidine Derivatives     Nystatin    Oxycodone    Serevent [Salmeterol]    Cephalexin Nausea And Vomiting    (Not in a hospital admission)   No results found for this or any previous visit (from the past 48 hour(s)). No results found.  Review of Systems  All other systems reviewed and are negative.   Blood pressure 126/79, pulse 87, temperature 98.9 F (37.2 C), height 5' 1.5" (1.562 m), weight 189 lb (85.7 kg), SpO2 96 %. Physical Exam  t 190 lb (86.2 kg)   BMI 35.32 kg/m    Physical Exam Constitutional:      Appearance: She is well-developed.  HENT:     Head: Normocephalic.       Right Ear: External ear normal.     Left Ear: External ear normal. There is no impacted cerumen.  Eyes:     Pupils: Pupils are equal, round, and reactive to light.  Neck:     Thyroid: No thyromegaly.     Trachea: No tracheal deviation.  Cardiovascular:     Rate and Rhythm: Normal rate.  Pulmonary:     Effort: Pulmonary effort is normal.  Abdominal:     Palpations: Abdomen is soft.  Musculoskeletal:     Cervical back: No rigidity.  Skin:    General: Skin is warm and dry.  Neurological:     Mental Status: She is alert and oriented to person, place, and time.  Psychiatric:        Behavior: Behavior normal.  t 190 lb (86.2 kg)   BMI 35.32 kg/m    Physical Exam Constitutional:      Appearance: She is well-developed.  HENT:     Head: Normocephalic.     Right Ear: External ear normal.     Left Ear: External ear normal. There is no impacted cerumen.  Eyes:     Pupils: Pupils are equal, round, and reactive to light.  Neck:     Thyroid: No thyromegaly.     Trachea: No tracheal deviation.  Cardiovascular:     Rate and Rhythm: Normal rate.  Pulmonary:     Effort: Pulmonary effort is normal.  Abdominal:     Palpations: Abdomen is soft.  Musculoskeletal:     Cervical back: No rigidity.  Skin:    General: Skin is warm and dry.  Neurological:     Mental Status: She is alert and oriented to person, place,  and time.  Psychiatric:        Behavior: Behavior normal.  Assessment/Plan .   Plan would be three-level cervical fusion C3-4, C4-5, C5-6 with removal of disc herniations compression of the cord, allograft and plate.  Overnight stay postop collar discussed.  Risks of surgery discussed including dysphagia, dysphonia, pseudoarthrosis, possible need for posterior fusion if she did not heal anteriorly.  Patient states she has family questions plans and wants to wait till after the holidays for the surgery. I discussed with her risk well following her neck injury.  We will place her in a soft collar.  If she develops any progression of weakness or gait disturbance I have asked her to call me promptly.  She understands she has compression of the cord without myelopathic changes but if the disc herniation progresses she could have permanent damage that would not be reversible.  We will seek pulmonary clearance.  Questions were elicited and answered she understands request to proceed.  Eisha Chatterjee Anne Xianna Siverling, PA 09/28/2022, 8:47 AM    

## 2022-09-28 NOTE — Progress Notes (Signed)
Office Visit Note   Patient: Kimberly Jenkins           Date of Birth: [birth year]           MRN: [id number] Visit Date: 09/28/2022              Requested by: Martin Majestic, FNP [address] [place],  [place] [address] PCP: Martin Majestic, FNP  Chief Complaint  Patient presents with   Neck - Pain      HPI: Kimberly Jenkins is a pleasant 57 year old woman who is here for preoperative history and physical for upcoming C3-4 C4-5 C5-6 fusion with Dr. Lorin Mercy.  She has discussed the surgery with him outcomes expectations and reasoning behind going forward.  She is ready to do this.  She has a history significant for asthma and COPD related to industrial chemical exposure.  She does not smoke and never has.  She has had surgery in the past and had 1 episode of it "taking her a little while to wake up but no other problems and has had surgery since that particular surgery.  She is in chronic pain management.  She cannot tolerate oxycodone was really just a side effect many years ago when she had some itching and she currently uses oxycodone  Assessment & Plan: Visit Diagnoses:  1. Spinal stenosis in cervical region     Plan: I reviewed the risks of surgery with her which include but are not limited to bleeding infection anesthesia complications, dysphagia dysphonia and need for future surgery.  She is ready to move forward.  Full H&P dictated in hospital system  Follow-Up Instructions: Return 1 week post op.   Ortho Exam  Patient is alert, oriented, no adenopathy, well-dressed, normal affect, normal respiratory effort. Ortho Exam patient has slow gait slightly wide-based decreased balance with turning and changing direction.  Decreased sensation C6 distribution right hand.  Positive brachial plexus tenderness worse on the right than the left [place] on the right.  Positive Lhermitte.  Lower extremity reflexes are 3-4+ knee and ankle jerk.   Imaging: No results  found. No images are attached to the encounter.  Labs: Lab Results  Component Value Date   HGBA1C 5.6 04/24/2022   ESRSEDRATE 25 11/11/2019     Lab Results  Component Value Date   ALBUMIN 4.1 06/27/2021   ALBUMIN 4.2 08/07/2019   ALBUMIN 4.3 02/01/2017    No results found for: "MG" Lab Results  Component Value Date   VD25OH 87 04/24/2022    No results found for: "PREALBUMIN"    Latest Ref Rng & Units 07/25/2022    8:24 AM 04/24/2022    8:40 AM 09/29/2021    3:06 PM  CBC EXTENDED  WBC 3.8 - 10.8 Thousand/uL 4.8  4.5  6.6   RBC 3.80 - 5.10 Million/uL 4.68  4.47  4.60   Hemoglobin 11.7 - 15.5 g/dL 13.4  12.9  13.1   HCT 35.0 - 45.0 % 40.6  39.6  39.9   Platelets 140 - 400 Thousand/uL 247  225  251   NEUT# 1,500 - 7,800 cells/uL 2,606  1,814  3,221   Lymph# 850 - 3,900 cells/uL 1,608  1,931  2,402      Body mass index is 35.13 kg/m.  Orders:  No orders of the defined types were placed in this encounter.  No orders of the defined types were placed in this encounter.    Procedures: No procedures performed  Clinical  Data: No additional findings.  ROS:  All other systems negative, except as noted in the HPI. Review of Systems  Objective: Vital Signs: BP 126/79   Pulse 87   Temp 98.9 F (37.2 C)   Ht 5' 1.5" (1.562 m)   Wt 189 lb (85.7 kg)   SpO2 96%   BMI 35.13 kg/m   Specialty Comments:  No specialty comments available.  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/19/2022   Herniation of cervical intervertebral disc with radiculopathy 07/19/2022   Other spondylosis with radiculopathy, cervical region 07/04/2022   Cancer of kidney ([place]) 07/11/2021   Dehydration 07/11/2021   Kidney disease 07/11/2021   Hypokalemia 05/11/2020   Costochondritis 05/10/2020   Edema 05/10/2020   Epistaxis 05/10/2020   Fibroma 05/10/2020   Hypercholesterolemia 05/10/2020    Iron deficiency anemia secondary to blood loss (chronic) 05/10/2020   Migraine, unspecified, not intractable, without status migrainosus 05/10/2020   Other chronic diseases of tonsils and adenoids 05/10/2020   Hypertrophy of tonsils 05/10/2020   Asthma 05/07/2020   Fluid overload 05/07/2020   Pain in right knee 08/28/2019   Carpal tunnel syndrome of right wrist 04/17/2019   Ulnar tunnel syndrome of right wrist 04/17/2019   Lesion of ulnar nerve, right upper limb 11/12/2018   Current mild episode of major depressive disorder without prior episode ([place]) 12/12/2017   Abnormal weight loss 11/15/2017   Fibromyalgia 08/27/2016   History of right rotator cuff tear repair 08/27/2016   Diabetes 08/27/2016   Chronic obstructive pulmonary disease ([place]) 08/27/2016   Dyslipidemia 08/27/2016   History of renal cell carcinoma 08/27/2016   Anxiety 08/27/2016   Sleep apnea 08/27/2016   Rheumatoid arthritis of multiple sites with negative rheumatoid factor  07/18/2016   High risk medication use 07/18/2016   Other specified personal risk factors, not elsewhere classified 07/18/2016   Hand pain 07/13/2016   Foot pain 07/13/2016   Elevated sed rate 07/13/2016   Open wound of toe 05/03/2016   Ataxic gait 05/02/2016   Risk for falls 05/02/2016   Tremor 05/02/2016   Vitamin D deficiency 07/08/2015   Chest pain, unspecified 06/23/2015   Mood disorder ([place]) 06/17/2015   Essential hypertension 05/18/2015   Generalized anxiety disorder 05/12/2015   Acquired hypothyroidism 05/05/2015   Severe persistent asthma 04/30/2015   Other allergic rhinitis 04/30/2015   GERD (gastroesophageal reflux disease) 04/30/2015   Chronic kidney disease, stage 2 (mild) 03/02/2015   Obesity 03/01/2015   Chronic post-traumatic headache 02/17/2010   Depression, unspecified 02/17/2010   History of malignant neoplasm of kidney 02/17/2010   Hyperlipidemia associated with type 2 diabetes mellitus ([place]) 02/17/2010   Other  amnesia 02/17/2010   Polyosteoarthritis, 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 Overview:  Normal pharmacologic MPS Formatting of this note might be different from the original. Normal pharmacologic MPS Formatting of this note might be different from the original. Formatting of this note might be  different from the original. Normal pharmacologic MPS Formatting of this note might be different from the original. Form   Chronic obstructive pulmonary disease ([place]) 08/27/2016   Chronic post-traumatic headache 02/17/2010   Current mild episode of major depressive disorder without prior episode ([place]) 12/12/2017   Dehydration    Depression, unspecified 02/17/2010   Diabetes 08/27/2016   Dyslipidemia 08/27/2016   Edema 05/10/2020   Elevated sed rate 07/13/2016   48 on 05/2016   Epistaxis 05/10/2020   Essential hypertension 05/18/2015   Fibroma 05/10/2020   Fibromyalgia 08/27/2016   Fluid overload 05/07/2020   Foot pain 07/13/2016   Generalized anxiety disorder 05/12/2015   GERD (gastroesophageal reflux disease)    Hand pain 07/13/2016   High risk medication use 07/18/2016   History of malignant neoplasm of kidney 02/17/2010   Formatting of this note might be different from the original. Formatting of this note might be different from the original. Right partial nephrectomy 2008 Formatting of this note might be different from the original. Overview:  Right partial nephrectomy 2008   History of renal cell carcinoma 08/27/2016   Right partial  nephrectomy 2008   Hypercholesterolemia 05/10/2020   Formatting of this note might be different from the original. Formatting of this note might be different from the original. 12/07/2014 - TC 151/Trig 199/HDL 47/LDL 64  Lp(a) 10 ( <30)   Hyperlipidemia associated with type 2 diabetes mellitus ([place]) 02/17/2010   Formatting of this note might be different from the original. Overview:  12/07/2014 - TC 151/Trig 199/HDL 47/LDL 64  Lp(a) 10 ( <30)  Overview:  12/07/2014 - TC 151/Trig 199/HDL 47/LDL 64  Lp(a) 10 ( <30) Formatting of this note might be different from the original. 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   Kidney disease    Migraine, unspecified, not intractable, without status migrainosus 05/10/2020   Mood disorder ([place]) 06/17/2015   Obesity 03/01/2015   Open wound of toe 05/03/2016   Other allergic rhinitis 04/30/2015   Other amnesia 02/17/2010   Other chronic diseases of tonsils and adenoids 05/10/2020   Other specified personal risk factors, not elsewhere classified 07/18/2016   Pain in right knee 08/28/2019   Rheumatoid arthritis ([place])    Risk for falls 05/02/2016   Severe persistent asthma 04/30/2015   Sleep apnea 08/27/2016   On CPAP, 2 L oxygen   Sleep disturbance 02/17/2010   Spinal stenosis in cervical region 02/17/2010   Tremor    Vitamin D deficiency 07/08/2015    Family History  Problem Relation Age of Onset   Asthma Mother    Lung cancer Mother    Asthma Father    Allergic rhinitis Father    COPD Father    Heart attack Father    Arthritis Father    Brain cancer Paternal Uncle    Asthma Maternal Grandmother    Brain cancer Paternal Grandmother    Breast cancer Cousin    Breast cancer Cousin    Breast cancer Cousin    Brain cancer Brother    Heart attack Brother     Past Surgical  History:  Procedure Laterality Date   APPENDECTOMY  1983   CESAREAN SECTION     2 times, [place]  2009   COLONOSCOPY W/ POLYPECTOMY  2021   ELBOW  SURGERY Right    ulner nerve release   ESOPHAGEAL DILATION  2021   KIDNEY SURGERY Right 2008   1/2 right kidney removed   pellet insertion  03/2021   per patient   SHOULDER SURGERY Right 2006   TONSILLECTOMY  2011   TOTAL SHOULDER ARTHROPLASTY     TUBAL LIGATION  1986   WRIST SURGERY Right    ulner nerve release   Social History   Occupational History   Not on file  Tobacco Use   Smoking status: Never    Passive exposure: Past   Smokeless tobacco: Never  Vaping Use   Vaping Use: Never used  Substance and Sexual Activity   Alcohol use: No   Drug use: No   Sexual activity: Not on file

## 2022-09-28 NOTE — H&P (View-Only) (Signed)
Kimberly Jenkins is an 57 y.o. female.   Chief Complaint: Neck pain HPI:  Subjective:     Chief Complaint  Patient presents with   Neck - Pain, Follow-up      MRI review      HPI 57 year old female returns with ongoing problems with right arm numbness weakness gait problems and falling.  Past diagnosis cervical stenosis 10 years ago by MRI.  She has had carpal tunnel releases also cubital tunnel release.  Electrical test 05/02/2022 showed changes consistent with radiculopathy and not peripheral nerve compression and this test was done in [place].  Patient has been on chronic pain management Norco 10/325 taking 4 tablets daily which has not changed.  She has been told she needs to use a cane and has had balance problems.  MRI scan has been obtained and is available for review below.  Patient was never smoker but has been diagnosed with asthma and COPD and sees Dr. Arnell Sieving in [place].  She uses 2 L nasal oxygen at night and has sleep apnea.  No dyspnea during the day reported.  Last weekend she was put on a antibiotic for a sinus infection.  She appears well today and has clear lungs.  Given her surgery is still a week out I do not foresee this being a problem.  She has gotten pulmonary clearance  Past Medical History:  Diagnosis Date   Abnormal weight loss 11/15/2017   Acquired hypothyroidism 05/05/2015   Allergic rhinitis    Anxiety 08/27/2016   With depression   Asthma    Ataxic gait 05/02/2016   Cancer of kidney [facility])    Carpal tunnel syndrome of right wrist 04/17/2019   Chest pain, unspecified 06/23/2015   Formatting of this note might be different from the original. Overview:  Normal pharmacologic MPS Formatting of this note might be different from the original. Normal pharmacologic MPS Formatting of this note might be different from the original. Formatting of this note might be different from the original. Normal pharmacologic MPS Formatting of this note might be different from the  original. Form   Chronic obstructive pulmonary disease ([place]) 08/27/2016   Chronic post-traumatic headache 02/17/2010   Current mild episode of major depressive disorder without prior episode ([place]) 12/12/2017   Dehydration    Depression, unspecified 02/17/2010   Diabetes 08/27/2016   Dyslipidemia 08/27/2016   Edema 05/10/2020   Elevated sed rate 07/13/2016   48 on 05/2016   Epistaxis 05/10/2020   Essential hypertension 05/18/2015   Fibroma 05/10/2020   Fibromyalgia 08/27/2016   Fluid overload 05/07/2020   Foot pain 07/13/2016   Generalized anxiety disorder 05/12/2015   GERD (gastroesophageal reflux disease)    Hand pain 07/13/2016   High risk medication use 07/18/2016   History of malignant neoplasm of kidney 02/17/2010   Formatting of this note might be different from the original. Formatting of this note might be different from the original. Right partial nephrectomy 2008 Formatting of this note might be different from the original. Overview:  Right partial nephrectomy 2008   History of renal cell carcinoma 08/27/2016   Right partial nephrectomy 2008   Hypercholesterolemia 05/10/2020   Formatting of this note might be different from the original. Formatting of this note might be different from the original. 12/07/2014 - TC 151/Trig 199/HDL 47/LDL 64  Lp(a) 10 ( <30)   Hyperlipidemia associated with type 2 diabetes mellitus ([place]) 02/17/2010   Formatting of this note might be different from the original. Overview:  12/07/2014 - TC 151/Trig 199/HDL 47/LDL 64  Lp(a) 10 ( <30)  Overview:  12/07/2014 - TC 151/Trig 199/HDL 47/LDL 64  Lp(a) 10 ( <30) Formatting of this note might be different from the original. 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   Kidney disease    Migraine, unspecified, not intractable, without status  migrainosus 05/10/2020   Mood disorder ([place]) 06/17/2015   Obesity 03/01/2015   Open wound of toe 05/03/2016   Other allergic rhinitis 04/30/2015   Other amnesia 02/17/2010   Other chronic diseases of tonsils and adenoids 05/10/2020   Other specified personal risk factors, not elsewhere classified 07/18/2016   Pain in right knee 08/28/2019   Rheumatoid arthritis ([place])    Risk for falls 05/02/2016   Severe persistent asthma 04/30/2015   Sleep apnea 08/27/2016   On CPAP, 2 L oxygen   Sleep disturbance 02/17/2010   Spinal stenosis in cervical region 02/17/2010   Tremor    Vitamin D deficiency 07/08/2015    Past Surgical History:  Procedure Laterality Date   [place]     2 times, [place]  2009   COLONOSCOPY W/ POLYPECTOMY  2021   ELBOW SURGERY Right    ulner nerve release   ESOPHAGEAL DILATION  2021   KIDNEY SURGERY Right 2008   1/2 right kidney removed   pellet insertion  03/2021   per patient   SHOULDER SURGERY Right 2006   TONSILLECTOMY  2011   TOTAL SHOULDER ARTHROPLASTY     TUBAL LIGATION  1986   WRIST SURGERY Right    ulner nerve release    Family History  Problem Relation Age of Onset   Asthma Mother    Lung cancer Mother    Asthma Father    Allergic rhinitis Father    COPD Father    Heart attack Father    Arthritis Father    Brain cancer Paternal Uncle    Asthma Maternal Grandmother    Brain cancer Paternal Grandmother    Breast cancer Cousin    Breast cancer Cousin    Breast cancer Cousin    Brain cancer Brother    Heart attack Brother    Social History:  reports that she has never smoked. She has been exposed to tobacco smoke. She has never used smokeless tobacco. She reports that she does not drink alcohol and does not use drugs.  Allergies:  Allergies  Allergen Reactions   Methotrexate Diarrhea   Advair Diskus [Fluticasone-Salmeterol]    Aspirin    Ciprofloxacin    Clonidine Derivatives     Nystatin    Oxycodone    Serevent [Salmeterol]    Cephalexin Nausea And Vomiting    (Not in a hospital admission)   No results found for this or any previous visit (from the past 48 hour(s)). No results found.  Review of Systems  All other systems reviewed and are negative.   Blood pressure 126/79, pulse 87, temperature 98.9 F (37.2 C), height 5' 1.5" (1.562 m), weight 189 lb (85.7 kg), SpO2 96 %. Physical Exam  t 190 lb (86.2 kg)   BMI 35.32 kg/m    Physical Exam Constitutional:      Appearance: She is well-developed.  HENT:     Head: Normocephalic.  Right Ear: External ear normal.     Left Ear: External ear normal. There is no impacted cerumen.  Eyes:     Pupils: Pupils are equal, round, and reactive to light.  Neck:     Thyroid: No thyromegaly.     Trachea: No tracheal deviation.  Cardiovascular:     Rate and Rhythm: Normal rate.  Pulmonary:     Effort: Pulmonary effort is normal.  Abdominal:     Palpations: Abdomen is soft.  Musculoskeletal:     Cervical back: No rigidity.  Skin:    General: Skin is warm and dry.  Neurological:     Mental Status: She is alert and oriented to person, place, and time.  Psychiatric:        Behavior: Behavior normal.  t 190 lb (86.2 kg)   BMI 35.32 kg/m    Physical Exam Constitutional:      Appearance: She is well-developed.  HENT:     Head: Normocephalic.     Right Ear: External ear normal.     Left Ear: External ear normal. There is no impacted cerumen.  Eyes:     Pupils: Pupils are equal, round, and reactive to light.  Neck:     Thyroid: No thyromegaly.     Trachea: No tracheal deviation.  Cardiovascular:     Rate and Rhythm: Normal rate.  Pulmonary:     Effort: Pulmonary effort is normal.  Abdominal:     Palpations: Abdomen is soft.  Musculoskeletal:     Cervical back: No rigidity.  Skin:    General: Skin is warm and dry.  Neurological:     Mental Status: She is alert and oriented to person, place,  and time.  Psychiatric:        Behavior: Behavior normal.  Assessment/Plan .   Plan would be three-level cervical fusion C3-4, C4-5, C5-6 with removal of disc herniations compression of the cord, allograft and plate.  Overnight stay postop collar discussed.  Risks of surgery discussed including dysphagia, dysphonia, pseudoarthrosis, possible need for posterior fusion if she did not heal anteriorly.  Patient states she has family questions plans and wants to wait till after the holidays for the surgery. I discussed with her risk well following her neck injury.  We will place her in a soft collar.  If she develops any progression of weakness or gait disturbance I have asked her to call me promptly.  She understands she has compression of the cord without myelopathic changes but if the disc herniation progresses she could have permanent damage that would not be reversible.  We will seek pulmonary clearance.  Questions were elicited and answered she understands request to proceed.  Bevely Palmer Cole [place], PA 09/28/2022, 8:47 AM

## 2022-10-02 ENCOUNTER — Other Ambulatory Visit: Payer: Self-pay | Admitting: Physician Assistant

## 2022-10-02 DIAGNOSIS — Z131 Encounter for screening for diabetes mellitus: Secondary | ICD-10-CM

## 2022-10-03 NOTE — Pre-Procedure Instructions (Signed)
Surgical Instructions    Your procedure is scheduled on Friday, February 9th.  Report to [facility] Main Entrance "A" at 05:30 A.M., then check in with the Admitting office.  Call this number if you have problems the morning of surgery:  [phone]  If you have any questions prior to your surgery date call [phone]: Open Monday-Friday 8am-4pm If you experience any cold or flu symptoms such as cough, fever, chills, shortness of breath, etc. between now and your scheduled surgery, please notify us at the above number.     Remember:  Do not eat after midnight the night before your surgery  You may drink clear liquids until 04:30 AM the morning of your surgery.   Clear liquids allowed are: Water, Non-Citrus Juices (without pulp), Carbonated Beverages, Clear Tea, Black Coffee Only (NO MILK, CREAM OR POWDERED CREAMER of any kind), and Gatorade.   Patient Instructions  The night before surgery:  No food after midnight. ONLY clear liquids after midnight   The day of surgery (if you have diabetes): Drink ONE (1) 12 oz G2 given to you in your pre admission testing appointment by 04:30 AM the morning of surgery. Drink in one sitting. Do not sip.  This drink was given to you during your hospital  pre-op appointment visit.  Nothing else to drink after completing the  12 oz bottle of G2.         If you have questions, please contact your surgeon's office.     Take these medicines the morning of surgery with A SIP OF WATER  BREO ELLIPTA  busPIRone (BUSPAR)  clorazepate (TRANXENE)  doxycycline (VIBRA-TABS)  FLUoxetine (PROZAC)  gabapentin (NEURONTIN)  icosapent Ethyl (VASCEPA)  levothyroxine (SYNTHROID, LEVOTHROID)  methylPREDNISolone (MEDROL DOSEPAK)  omeprazole (PRILOSEC)  Oxycodone HCl  pravastatin (PRAVACHOL)  REXULTI  SPIRIVA RESPIMAT  topiramate (TOPAMAX)    If needed: EPINEPHrine  fluticasone (FLONASE)  Ipratropium-Albuterol (COMBIVENT)  ipratropium-albuterol  (DUONEB)  olopatadine (PATANOL)  ondansetron (ZOFRAN)     Follow your provider's instructions regarding Humira and leflunomide (ARAVA).    As of today, STOP taking any Aspirin (unless otherwise instructed by your surgeon) Aleve, Naproxen, Ibuprofen, Motrin, Advil, Goody's, BC's, all herbal medications, fish oil, and all vitamins.   WHAT DO I DO ABOUT MY DIABETES MEDICATION?   HOLD OZEMPIC FOR 7 DAYS PRIOR TO SURGERY. LAST DOSE 1/29.  THE NIGHT BEFORE SURGERY, take 22.5 units (50%) of bedtime dose of insulin glargine (LANTUS SOLOSTAR).         HOW TO MANAGE YOUR DIABETES BEFORE AND AFTER SURGERY  Why is it important to control my blood sugar before and after surgery? Improving blood sugar levels before and after surgery helps healing and can limit problems. A way of improving blood sugar control is eating a healthy diet by:  Eating less sugar and carbohydrates  Increasing activity/exercise  Talking with your doctor about reaching your blood sugar goals High blood sugars (greater than 180 mg/dL) can raise your risk of infections and slow your recovery, so you will need to focus on controlling your diabetes during the weeks before surgery. Make sure that the doctor who takes care of your diabetes knows about your planned surgery including the date and location.  How do I manage my blood sugar before surgery? Check your blood sugar at least 4 times a day, starting 2 days before surgery, to make sure that the level is not too high or low.  Check your blood sugar the morning of your surgery  when you wake up and every 2 hours until you get to the Short Stay unit.  If your blood sugar is less than 70 mg/dL, you will need to treat for low blood sugar: Do not take insulin. Treat a low blood sugar (less than 70 mg/dL) with  cup of clear juice (cranberry or apple), 4 glucose tablets, OR glucose gel. Recheck blood sugar in 15 minutes after treatment (to make sure it is greater than 70  mg/dL). If your blood sugar is not greater than 70 mg/dL on recheck, call [phone] for further instructions. Report your blood sugar to the short stay nurse when you get to Short Stay.  If you are admitted to the hospital after surgery: Your blood sugar will be checked by the staff and you will probably be given insulin after surgery (instead of oral diabetes medicines) to make sure you have good blood sugar levels. The goal for blood sugar control after surgery is 80-180 mg/dL.                     Do NOT Smoke (Tobacco/Vaping) for 24 hours prior to your procedure.  If you use a CPAP at night, you may bring your mask/headgear for your overnight stay.   Contacts, glasses, piercing's, hearing aid's, dentures or partials may not be worn into surgery, please bring cases for these belongings.    For patients admitted to the hospital, discharge time will be determined by your treatment team.   Patients discharged the day of surgery will not be allowed to drive home, and someone needs to stay with them for 24 hours.  SURGICAL WAITING ROOM VISITATION Patients having surgery or a procedure may have no more than 2 support people in the waiting area - these visitors may rotate.   Children under the age of 50 must have an adult with them who is not the patient. If the patient needs to stay at the hospital during part of their recovery, the visitor guidelines for inpatient rooms apply. Pre-op nurse will coordinate an appropriate time for 1 support person to accompany patient in pre-op.  This support person may not rotate.   Please refer to the [facility] website for the visitor guidelines for Inpatients (after your surgery is over and you are in a regular room).    Special instructions:   - Preparing For Surgery  Before surgery, you can play an important role. Because skin is not sterile, your skin needs to be as free of germs as possible. You can reduce the number of germs on your  skin by washing with CHG (chlorahexidine gluconate) Soap before surgery.  CHG is an antiseptic cleaner which kills germs and bonds with the skin to continue killing germs even after washing.    Oral Hygiene is also important to reduce your risk of infection.  Remember - BRUSH YOUR TEETH THE MORNING OF SURGERY WITH YOUR REGULAR TOOTHPASTE  Please do not use if you have an allergy to CHG or antibacterial soaps. If your skin becomes reddened/irritated stop using the CHG.  Do not shave (including legs and underarms) for at least 48 hours prior to first CHG shower. It is OK to shave your face.  Please follow these instructions carefully.   Shower the NIGHT BEFORE SURGERY and the MORNING OF SURGERY  If you chose to wash your hair, wash your hair first as usual with your normal shampoo.  After you shampoo, rinse your hair and body thoroughly to remove the  shampoo.  Use CHG Soap as you would any other liquid soap. You can apply CHG directly to the skin and wash gently with a scrungie or a clean washcloth.   Apply the CHG Soap to your body ONLY FROM THE NECK DOWN.  Do not use on open wounds or open sores. Avoid contact with your eyes, ears, mouth and genitals (private parts). Wash Face and genitals (private parts)  with your normal soap.   Wash thoroughly, paying special attention to the area where your surgery will be performed.  Thoroughly rinse your body with warm water from the neck down.  DO NOT shower/wash with your normal soap after using and rinsing off the CHG Soap.  Pat yourself dry with a CLEAN TOWEL.  Wear CLEAN PAJAMAS to bed the night before surgery  Place CLEAN SHEETS on your bed the night before your surgery  DO NOT SLEEP WITH PETS.   Day of Surgery: Take a shower with CHG soap. Do not wear jewelry or makeup Do not wear lotions, powders, perfumes, or deodorant. Do not shave 48 hours prior to surgery.   Do not bring valuables to the hospital. [facility] is not  responsible for any belongings or valuables. Do not wear nail polish, gel polish, artificial nails, or any other type of covering on natural nails (fingers and toes) If you have artificial nails or gel coating that need to be removed by a nail salon, please have this removed prior to surgery. Artificial nails or gel coating may interfere with anesthesia's ability to adequately monitor your vital signs. Wear Clean/Comfortable clothing the morning of surgery Remember to brush your teeth WITH YOUR REGULAR TOOTHPASTE.   Please read over the following fact sheets that you were given.    If you received a COVID test during your pre-op visit  it is requested that you wear a mask when out in public, stay away from anyone that may not be feeling well and notify your surgeon if you develop symptoms. If you have been in contact with anyone that has tested positive in the last 10 days please notify you surgeon.

## 2022-10-04 ENCOUNTER — Encounter (HOSPITAL_COMMUNITY)
Admission: RE | Admit: 2022-10-04 | Discharge: 2022-10-04 | Disposition: A | Discharge status: Home / Self Care | Payer: 59 | Source: Ambulatory Visit | Attending: Orthopaedic Surgery | Admitting: Orthopaedic Surgery

## 2022-10-04 ENCOUNTER — Other Ambulatory Visit: Payer: Self-pay

## 2022-10-04 ENCOUNTER — Encounter (HOSPITAL_COMMUNITY): Payer: Self-pay

## 2022-10-04 VITALS — BP 126/72 | HR 90 | Temp 98.2°F | Resp 17 | Ht 61.5 in | Wt 191.0 lb

## 2022-10-04 DIAGNOSIS — E119 Type 2 diabetes mellitus without complications: Secondary | ICD-10-CM

## 2022-10-04 DIAGNOSIS — Z794 Long term (current) use of insulin: Secondary | ICD-10-CM | POA: Insufficient documentation

## 2022-10-04 DIAGNOSIS — Z01818 Encounter for other preprocedural examination: Secondary | ICD-10-CM | POA: Insufficient documentation

## 2022-10-04 DIAGNOSIS — E1169 Type 2 diabetes mellitus with other specified complication: Secondary | ICD-10-CM

## 2022-10-04 DIAGNOSIS — Z131 Encounter for screening for diabetes mellitus: Secondary | ICD-10-CM | POA: Insufficient documentation

## 2022-10-04 DIAGNOSIS — K760 Fatty (change of) liver, not elsewhere classified: Secondary | ICD-10-CM | POA: Insufficient documentation

## 2022-10-04 HISTORY — DX: Chronic kidney disease, stage 2 (mild): N18.2

## 2022-10-04 HISTORY — DX: Unspecified osteoarthritis, unspecified site: M19.90

## 2022-10-04 HISTORY — DX: Personal history of other diseases of the digestive system: Z87.19

## 2022-10-04 HISTORY — DX: Attention-deficit hyperactivity disorder, unspecified type: F90.9

## 2022-10-04 HISTORY — DX: Atrioventricular block, first degree: I44.0

## 2022-10-04 HISTORY — DX: Fatty (change of) liver, not elsewhere classified: K76.0

## 2022-10-04 LAB — SURGICAL PCR SCREEN
MRSA, PCR: NEGATIVE
Staphylococcus aureus: NEGATIVE

## 2022-10-04 LAB — CBC
HCT: 40.7 % (ref 36.0–46.0)
Hemoglobin: 13.1 g/dL (ref 12.0–15.0)
MCH: 28.5 pg (ref 26.0–34.0)
MCHC: 32.2 g/dL (ref 30.0–36.0)
MCV: 88.7 fL (ref 80.0–100.0)
Platelets: 233 10*3/uL (ref 150–400)
RBC: 4.59 MIL/uL (ref 3.87–5.11)
RDW: 13.9 % (ref 11.5–15.5)
WBC: 5.5 10*3/uL (ref 4.0–10.5)
nRBC: 0 % (ref 0.0–0.2)

## 2022-10-04 LAB — COMPREHENSIVE METABOLIC PANEL
ALT: 13 U/L (ref 0–44)
AST: 21 U/L (ref 15–41)
Albumin: 3.3 g/dL — ABNORMAL LOW (ref 3.5–5.0)
Alkaline Phosphatase: 81 U/L (ref 38–126)
Anion gap: 10 (ref 5–15)
BUN: 12 mg/dL (ref 6–20)
CO2: 24 mmol/L (ref 22–32)
Calcium: 8.6 mg/dL — ABNORMAL LOW (ref 8.9–10.3)
Chloride: 104 mmol/L (ref 98–111)
Creatinine, Ser: 1 mg/dL (ref 0.44–1.00)
GFR, Estimated: >60 mL/min (ref >60)
Glucose, Bld: 141 mg/dL — ABNORMAL HIGH (ref 70–99)
Potassium: 4 mmol/L (ref 3.5–5.1)
Sodium: 138 mmol/L (ref 135–145)
Total Bilirubin: 0.3 mg/dL (ref 0.3–1.2)
Total Protein: 6.4 g/dL — ABNORMAL LOW (ref 6.5–8.1)

## 2022-10-04 LAB — HEMOGLOBIN A1C
Hgb A1c MFr Bld: 5.9 % — ABNORMAL HIGH (ref 4.8–5.6)
Mean Plasma Glucose: 122.63 mg/dL

## 2022-10-04 LAB — TYPE AND SCREEN
ABO/RH(D): A NEG
Antibody Screen: NEGATIVE

## 2022-10-04 LAB — GLUCOSE, CAPILLARY: Glucose-Capillary: 156 mg/dL — ABNORMAL HIGH (ref 70–99)

## 2022-10-04 NOTE — Progress Notes (Signed)
PCP - Gordy Clement, NP Cardiologist - Dr. Shirlee More (Last saw 06/2021)  PPM/ICD - denies   Chest x-ray - 06/15/16 EKG - 10/04/22 Stress Test - 10/18/15 ECHO - 07/26/15 Cardiac Cath - pt states she had one 20+ years ago, no blockage found per pt  Sleep Study - 2-3 years ago per pt, OSA+ CPAP - nightly  Fasting Blood Sugar - 90-160 Checks Blood Sugar twice a day  Last dose of GLP1 agonist-  09/25/22 GLP1 instructions: Hold 7 days   ASA/Blood Thinner Instructions: n/a   ERAS Protcol - yes PRE-SURGERY G2- given at PAT  COVID TEST- n/a   Anesthesia review: yes, cardiac hx  Patient denies shortness of breath, fever, cough and chest pain at PAT appointment   All instructions explained to the patient, with a verbal understanding of the material. Patient agrees to go over the instructions while at home for a better understanding. The opportunity to ask questions was provided.

## 2022-10-05 ENCOUNTER — Ambulatory Visit (INDEPENDENT_AMBULATORY_CARE_PROVIDER_SITE_OTHER): Payer: 59

## 2022-10-05 DIAGNOSIS — J4550 Severe persistent asthma, uncomplicated: Secondary | ICD-10-CM

## 2022-10-05 NOTE — Anesthesia Preprocedure Evaluation (Addendum)
Anesthesia Evaluation  Patient identified by MRN, date of birth, ID band Patient awake    Reviewed: Allergy & Precautions, NPO status , Patient's Chart, lab work & pertinent test results  Airway Mallampati: III  TM Distance: >3 FB Neck ROM: Limited    Dental  (+) Dental Advisory Given, Edentulous Lower, Edentulous Upper   Pulmonary asthma , COPD,  COPD inhaler and oxygen dependent   Pulmonary exam normal breath sounds clear to auscultation       Cardiovascular hypertension, Normal cardiovascular exam Rhythm:Regular Rate:Normal     Neuro/Psych  Headaches PSYCHIATRIC DISORDERS Anxiety Depression    cervical stenosis, myelopathy C3-4, C4-5, C5-6  Neuromuscular disease    GI/Hepatic Neg liver ROS, hiatal hernia,GERD  Medicated,,  Endo/Other  diabetes, Type 2, Insulin DependentHypothyroidism  Obesity   Renal/GU Renal disease     Musculoskeletal  (+) Arthritis , Rheumatoid disorders,  Fibromyalgia -  Abdominal   Peds  (+) ADHD Hematology negative hematology ROS (+)   Anesthesia Other Findings Day of surgery medications reviewed with the patient.  Reproductive/Obstetrics                             Anesthesia Physical Anesthesia Plan  ASA: 3  Anesthesia Plan: General   Post-op Pain Management: Ofirmev IV (intra-op)*   Induction: Intravenous  PONV Risk Score and Plan: 3 and Midazolam, Dexamethasone and Ondansetron  Airway Management Planned: Oral ETT and Video Laryngoscope Planned  Additional Equipment: ClearSight  Intra-op Plan:   Post-operative Plan: Extubation in OR  Informed Consent: I have reviewed the patients History and Physical, chart, labs and discussed the procedure including the risks, benefits and alternatives for the proposed anesthesia with the patient or authorized representative who has indicated his/her understanding and acceptance.     Dental advisory  given  Plan Discussed with: CRNA  Anesthesia Plan Comments: (2nd PIV after induction   PAT note by Karoline Caldwell, PA-C: Patient seen by cardiologist Dr. Bettina Gavia 07/12/2021 at the request of her PCP for evaluation of first-degree AV block and occasional PACs and PVCs on event monitor.  Dr. Bettina Gavia notes that he had also seen the patient previously in 2016 for evaluation of hypertension, OSA, peripheral edema, and shortness of breath and had benign echo and stress test at that time.  These results are not currently available for review as this was done through [facility] cardiology which is no longer in practice.  Per note 07/12/2021, he provided reassurance to the patient does not feel she has any significant conduction system disease and does not require further testing.  She was recommended to continue current medications and follow-up with cardiology on an as-needed basis.  Patient was seen by her pulmonologist Dr. Gardiner Rhyme at [facility] on 08/10/2022 for preop evaluation.  She has OSA as well as nocturnal hypoxemia and wears CPAP with 2 L O2.  She also has moderate persistent asthma with chronic cough and is maintained on Breo Ellipta and Spiriva.  Per note, "patient is at low to moderate risk for general surgery due to respiratory issues.  I think that the patient should be given a nebulizer treatment an hour before and also after anesthesia.  Patient should be on the lowest oxygen (postsurgery) to keep the pulse ox between 90 to 92%.  Patient may need CPAP PS 9 after anesthesia.  I will be glad to assist you and please do not hesitate to call."  History of rheumatoid arthritis, followed by rheumatology, maintained on Humira and Arava.  Follows with allergist Dr. Neldon Mc for hx of asthma, allergic rhinitis. Maintained on Nucala, singulair, omeprazole.  Hx of renal cell carcinoma s/p right partial nephrectomy 2008.  IDDM 2, patient is also on once weekly GLP-1  agonist Ozempic.  Per patient, last dose 10/02/2022.  EKG 10/04/22: NSR. Rate 82   )        Anesthesia Quick Evaluation

## 2022-10-05 NOTE — Progress Notes (Signed)
Anesthesia Chart Review:  Patient seen by cardiologist Dr. Bettina Gavia 07/12/2021 at the request of her PCP for evaluation of first-degree AV block and occasional PACs and PVCs on event monitor.  Dr. Bettina Gavia notes that he had also seen the patient previously in 2016 for evaluation of hypertension, OSA, peripheral edema, and shortness of breath and had benign echo and stress test at that time.  These results are not currently available for review as this was done through [facility] cardiology which is no longer in practice.  Per note 07/12/2021, he provided reassurance to the patient does not feel she has any significant conduction system disease and does not require further testing.  She was recommended to continue current medications and follow-up with cardiology on an as-needed basis.  Patient was seen by her pulmonologist Dr. Gardiner Rhyme at [facility] on 08/10/2022 for preop evaluation.  She has OSA as well as nocturnal hypoxemia and wears CPAP with 2 L O2.  She also has moderate persistent asthma with chronic cough and is maintained on Breo Ellipta and Spiriva.  Per note, "patient is at low to moderate risk for general surgery due to respiratory issues.  I think that the patient should be given a nebulizer treatment an hour before and also after anesthesia.  Patient should be on the lowest oxygen (postsurgery) to keep the pulse ox between 90 to 92%.  Patient may need CPAP PS 9 after anesthesia.  I will be glad to assist you and please do not hesitate to call."  History of rheumatoid arthritis, followed by rheumatology, maintained on Humira and [place].  Follows with allergist Dr. Neldon Mc for hx of asthma, allergic rhinitis. Maintained on Nucala, singulair, omeprazole.  Hx of renal cell carcinoma s/p right partial nephrectomy 2008.  IDDM 2, patient is also on once weekly GLP-1 agonist Ozempic.  Per patient, last dose 10/02/2022.  EKG 10/04/22: NSR. Rate [address]   Wynonia Musty [facility] Short Stay Center/Anesthesiology Phone [phone] 10/05/2022 9:45 AM .

## 2022-10-06 ENCOUNTER — Other Ambulatory Visit: Payer: Self-pay

## 2022-10-06 ENCOUNTER — Inpatient Hospital Stay (HOSPITAL_COMMUNITY): Payer: 59 | Admitting: Physician Assistant

## 2022-10-06 ENCOUNTER — Inpatient Hospital Stay (HOSPITAL_COMMUNITY): Payer: 59

## 2022-10-06 ENCOUNTER — Encounter (HOSPITAL_COMMUNITY)
Admission: RE | Disposition: A | Discharge status: Home / Self Care | Payer: Self-pay | Source: Home / Self Care | Attending: Orthopaedic Surgery

## 2022-10-06 ENCOUNTER — Inpatient Hospital Stay (HOSPITAL_COMMUNITY)
Admission: RE | Admit: 2022-10-06 | Discharge: 2022-10-07 | DRG: 472 | Disposition: A | Discharge status: Home / Self Care | Payer: 59 | Attending: Orthopaedic Surgery | Admitting: Orthopaedic Surgery

## 2022-10-06 ENCOUNTER — Encounter (HOSPITAL_COMMUNITY): Payer: Self-pay | Admitting: Orthopaedic Surgery

## 2022-10-06 DIAGNOSIS — E119 Type 2 diabetes mellitus without complications: Secondary | ICD-10-CM

## 2022-10-06 DIAGNOSIS — M5412 Radiculopathy, cervical region: Secondary | ICD-10-CM | POA: Diagnosis present

## 2022-10-06 DIAGNOSIS — G4733 Obstructive sleep apnea (adult) (pediatric): Secondary | ICD-10-CM | POA: Diagnosis present

## 2022-10-06 DIAGNOSIS — E7800 Pure hypercholesterolemia, unspecified: Secondary | ICD-10-CM | POA: Diagnosis present

## 2022-10-06 DIAGNOSIS — Z905 Acquired absence of kidney: Secondary | ICD-10-CM

## 2022-10-06 DIAGNOSIS — M4602 Spinal enthesopathy, cervical region: Secondary | ICD-10-CM | POA: Diagnosis present

## 2022-10-06 DIAGNOSIS — Z85528 Personal history of other malignant neoplasm of kidney: Secondary | ICD-10-CM

## 2022-10-06 DIAGNOSIS — M4802 Spinal stenosis, cervical region: Secondary | ICD-10-CM | POA: Diagnosis not present

## 2022-10-06 DIAGNOSIS — G9741 Accidental puncture or laceration of dura during a procedure: Secondary | ICD-10-CM | POA: Diagnosis not present

## 2022-10-06 DIAGNOSIS — Z8261 Family history of arthritis: Secondary | ICD-10-CM | POA: Diagnosis not present

## 2022-10-06 DIAGNOSIS — E669 Obesity, unspecified: Secondary | ICD-10-CM | POA: Diagnosis present

## 2022-10-06 DIAGNOSIS — J4489 Other specified chronic obstructive pulmonary disease: Secondary | ICD-10-CM | POA: Diagnosis present

## 2022-10-06 DIAGNOSIS — M5001 Cervical disc disorder with myelopathy,  high cervical region: Secondary | ICD-10-CM | POA: Diagnosis present

## 2022-10-06 DIAGNOSIS — Z801 Family history of malignant neoplasm of trachea, bronchus and lung: Secondary | ICD-10-CM | POA: Diagnosis not present

## 2022-10-06 DIAGNOSIS — I10 Essential (primary) hypertension: Secondary | ICD-10-CM | POA: Diagnosis present

## 2022-10-06 DIAGNOSIS — M797 Fibromyalgia: Secondary | ICD-10-CM | POA: Diagnosis present

## 2022-10-06 DIAGNOSIS — Z794 Long term (current) use of insulin: Secondary | ICD-10-CM | POA: Diagnosis not present

## 2022-10-06 DIAGNOSIS — E1169 Type 2 diabetes mellitus with other specified complication: Secondary | ICD-10-CM | POA: Diagnosis present

## 2022-10-06 DIAGNOSIS — M069 Rheumatoid arthritis, unspecified: Secondary | ICD-10-CM | POA: Diagnosis present

## 2022-10-06 DIAGNOSIS — J4550 Severe persistent asthma, uncomplicated: Secondary | ICD-10-CM | POA: Diagnosis present

## 2022-10-06 DIAGNOSIS — Z6835 Body mass index (BMI) 35.0-35.9, adult: Secondary | ICD-10-CM

## 2022-10-06 DIAGNOSIS — Z808 Family history of malignant neoplasm of other organs or systems: Secondary | ICD-10-CM

## 2022-10-06 DIAGNOSIS — M5020 Other cervical disc displacement, unspecified cervical region: Secondary | ICD-10-CM | POA: Diagnosis present

## 2022-10-06 DIAGNOSIS — Z8249 Family history of ischemic heart disease and other diseases of the circulatory system: Secondary | ICD-10-CM

## 2022-10-06 DIAGNOSIS — Z886 Allergy status to analgesic agent status: Secondary | ICD-10-CM

## 2022-10-06 DIAGNOSIS — Z885 Allergy status to narcotic agent status: Secondary | ICD-10-CM

## 2022-10-06 DIAGNOSIS — Z825 Family history of asthma and other chronic lower respiratory diseases: Secondary | ICD-10-CM | POA: Diagnosis not present

## 2022-10-06 DIAGNOSIS — E039 Hypothyroidism, unspecified: Secondary | ICD-10-CM | POA: Diagnosis present

## 2022-10-06 DIAGNOSIS — Z888 Allergy status to other drugs, medicaments and biological substances status: Secondary | ICD-10-CM

## 2022-10-06 DIAGNOSIS — Z9049 Acquired absence of other specified parts of digestive tract: Secondary | ICD-10-CM | POA: Diagnosis not present

## 2022-10-06 DIAGNOSIS — Z881 Allergy status to other antibiotic agents status: Secondary | ICD-10-CM

## 2022-10-06 DIAGNOSIS — Z803 Family history of malignant neoplasm of breast: Secondary | ICD-10-CM | POA: Diagnosis not present

## 2022-10-06 DIAGNOSIS — Z96619 Presence of unspecified artificial shoulder joint: Secondary | ICD-10-CM | POA: Diagnosis present

## 2022-10-06 HISTORY — PX: ANTERIOR CERVICAL DECOMP/DISCECTOMY FUSION: SHX1161

## 2022-10-06 LAB — GLUCOSE, CAPILLARY
Glucose-Capillary: 132 mg/dL — ABNORMAL HIGH (ref 70–99)
Glucose-Capillary: 177 mg/dL — ABNORMAL HIGH (ref 70–99)
Glucose-Capillary: 242 mg/dL — ABNORMAL HIGH (ref 70–99)
Glucose-Capillary: 178 mg/dL — ABNORMAL HIGH (ref 70–99)

## 2022-10-06 LAB — ABO/RH: ABO/RH(D): A NEG

## 2022-10-06 SURGERY — ANTERIOR CERVICAL DECOMPRESSION/DISCECTOMY FUSION 3 LEVELS
Anesthesia: General

## 2022-10-06 MED ORDER — FLUTICASONE FUROATE-VILANTEROL 200-25 MCG/ACT IN AEPB
1.0000 | INHALATION_SPRAY | Freq: Every day | RESPIRATORY_TRACT | Status: DC
  Filled 2022-10-06: qty 28

## 2022-10-06 MED ORDER — MIDAZOLAM HCL 2 MG/2ML IJ SOLN
INTRAMUSCULAR | Status: AC
  Filled 2022-10-06: qty 2

## 2022-10-06 MED ORDER — DEXAMETHASONE SODIUM PHOSPHATE 10 MG/ML IJ SOLN
INTRAMUSCULAR | Status: DC | PRN
  Administered 2022-10-06: 5 mg via INTRAVENOUS

## 2022-10-06 MED ORDER — NYSTATIN 100000 UNIT/ML MT SUSP: 5.0000 mL | Freq: Every day | OROMUCOSAL | Status: DC

## 2022-10-06 MED ORDER — POTASSIUM CHLORIDE CRYS ER 20 MEQ PO TBCR
20.0000 meq | EXTENDED_RELEASE_TABLET | Freq: Two times a day (BID) | ORAL | Status: DC
  Administered 2022-10-06 – 2022-10-07 (×2): 20 meq via ORAL
  Filled 2022-10-06 (×2): qty 1

## 2022-10-06 MED ORDER — ICOSAPENT ETHYL 1 G PO CAPS
1.0000 g | ORAL_CAPSULE | Freq: Two times a day (BID) | ORAL | Status: DC
  Administered 2022-10-06 – 2022-10-07 (×2): 1 g via ORAL
  Filled 2022-10-06 (×2): qty 1

## 2022-10-06 MED ORDER — MESALAMINE 1.2 G PO TBEC
2.4000 g | DELAYED_RELEASE_TABLET | Freq: Every day | ORAL | Status: DC
  Administered 2022-10-07: 2.4 g via ORAL
  Filled 2022-10-06: qty 2

## 2022-10-06 MED ORDER — ACETAMINOPHEN 10 MG/ML IV SOLN
INTRAVENOUS | Status: AC
  Filled 2022-10-06: qty 100

## 2022-10-06 MED ORDER — LACTATED RINGERS IV SOLN: INTRAVENOUS | Status: DC

## 2022-10-06 MED ORDER — KETAMINE HCL 50 MG/5ML IJ SOSY
PREFILLED_SYRINGE | INTRAMUSCULAR | Status: AC
  Filled 2022-10-06: qty 5

## 2022-10-06 MED ORDER — PROPOFOL 10 MG/ML IV BOLUS
INTRAVENOUS | Status: AC
  Filled 2022-10-06: qty 20

## 2022-10-06 MED ORDER — LEVOTHYROXINE SODIUM 25 MCG PO TABS
50.0000 ug | ORAL_TABLET | Freq: Every day | ORAL | Status: DC
  Administered 2022-10-07: 50 ug via ORAL

## 2022-10-06 MED ORDER — BREXPIPRAZOLE 2 MG PO TABS
2.0000 mg | ORAL_TABLET | Freq: Every day | ORAL | Status: DC
  Administered 2022-10-07: 2 mg via ORAL
  Filled 2022-10-06: qty 1

## 2022-10-06 MED ORDER — EPINEPHRINE PF 1 MG/ML IJ SOLN
INTRAMUSCULAR | Status: AC
  Filled 2022-10-06: qty 1

## 2022-10-06 MED ORDER — FENTANYL CITRATE (PF) 250 MCG/5ML IJ SOLN
INTRAMUSCULAR | Status: DC | PRN
  Administered 2022-10-06: 25 ug via INTRAVENOUS
  Administered 2022-10-06: 100 ug via INTRAVENOUS
  Administered 2022-10-06 (×2): 50 ug via INTRAVENOUS
  Administered 2022-10-06: 25 ug via INTRAVENOUS

## 2022-10-06 MED ORDER — SODIUM CHLORIDE 0.9% FLUSH: 3.0000 mL | INTRAVENOUS | Status: DC | PRN

## 2022-10-06 MED ORDER — ONDANSETRON HCL 4 MG/2ML IJ SOLN: 4.0000 mg | Freq: Four times a day (QID) | INTRAMUSCULAR | Status: DC | PRN

## 2022-10-06 MED ORDER — ONDANSETRON HCL 4 MG PO TABS: 4.0000 mg | ORAL_TABLET | Freq: Four times a day (QID) | ORAL | Status: DC | PRN

## 2022-10-06 MED ORDER — PRIMIDONE 250 MG PO TABS
250.0000 mg | ORAL_TABLET | Freq: Every day | ORAL | Status: DC
  Administered 2022-10-06: 250 mg via ORAL
  Filled 2022-10-06: qty 1

## 2022-10-06 MED ORDER — ONDANSETRON HCL 4 MG/2ML IJ SOLN
INTRAMUSCULAR | Status: DC | PRN
  Administered 2022-10-06: 4 mg via INTRAVENOUS

## 2022-10-06 MED ORDER — OLOPATADINE HCL 0.1 % OP SOLN
1.0000 [drp] | Freq: Two times a day (BID) | OPHTHALMIC | Status: DC
  Administered 2022-10-06 – 2022-10-07 (×2): 1 [drp] via OPHTHALMIC
  Filled 2022-10-06: qty 5

## 2022-10-06 MED ORDER — PANTOPRAZOLE SODIUM 40 MG PO TBEC
80.0000 mg | DELAYED_RELEASE_TABLET | Freq: Every day | ORAL | Status: DC
  Administered 2022-10-07: 80 mg via ORAL
  Filled 2022-10-06 (×2): qty 2

## 2022-10-06 MED ORDER — FLUOXETINE HCL 20 MG PO CAPS
80.0000 mg | ORAL_CAPSULE | Freq: Every day | ORAL | Status: DC
  Administered 2022-10-07: 80 mg via ORAL
  Filled 2022-10-06: qty 4

## 2022-10-06 MED ORDER — OXYCODONE-ACETAMINOPHEN 5-325 MG PO TABS: 1.0000 | ORAL_TABLET | Freq: Four times a day (QID) | ORAL | 0 refills | Status: DC | PRN

## 2022-10-06 MED ORDER — PROMETHAZINE HCL 25 MG/ML IJ SOLN: 6.2500 mg | INTRAMUSCULAR | Status: DC | PRN

## 2022-10-06 MED ORDER — CELECOXIB 200 MG PO CAPS
200.0000 mg | ORAL_CAPSULE | Freq: Two times a day (BID) | ORAL | Status: DC
  Administered 2022-10-06 – 2022-10-07 (×3): 200 mg via ORAL
  Filled 2022-10-06 (×3): qty 1

## 2022-10-06 MED ORDER — ALIGN PO CAPS: 1.0000 | ORAL_CAPSULE | Freq: Every day | ORAL | Status: DC

## 2022-10-06 MED ORDER — ROCURONIUM BROMIDE 10 MG/ML (PF) SYRINGE
PREFILLED_SYRINGE | INTRAVENOUS | Status: DC | PRN
  Administered 2022-10-06: 70 mg via INTRAVENOUS
  Administered 2022-10-06: 30 mg via INTRAVENOUS
  Administered 2022-10-06 (×4): 20 mg via INTRAVENOUS

## 2022-10-06 MED ORDER — 0.9 % SODIUM CHLORIDE (POUR BTL) OPTIME
TOPICAL | Status: DC | PRN
  Administered 2022-10-06: 1000 mL

## 2022-10-06 MED ORDER — HYDROMORPHONE HCL 1 MG/ML IJ SOLN
INTRAMUSCULAR | Status: DC | PRN
  Administered 2022-10-06: .5 mg via INTRAVENOUS

## 2022-10-06 MED ORDER — CHLORHEXIDINE GLUCONATE 0.12 % MT SOLN
15.0000 mL | Freq: Once | OROMUCOSAL | Status: AC
  Administered 2022-10-06: 15 mL via OROMUCOSAL
  Filled 2022-10-06: qty 15

## 2022-10-06 MED ORDER — FAMOTIDINE 20 MG PO TABS
20.0000 mg | ORAL_TABLET | Freq: Every day | ORAL | Status: DC
  Administered 2022-10-06: 20 mg via ORAL
  Filled 2022-10-06: qty 1

## 2022-10-06 MED ORDER — ONDANSETRON HCL 4 MG PO TABS: 8.0000 mg | ORAL_TABLET | Freq: Three times a day (TID) | ORAL | Status: DC | PRN

## 2022-10-06 MED ORDER — BUPIVACAINE-EPINEPHRINE 0.5% -1:200000 IJ SOLN
INTRAMUSCULAR | Status: DC | PRN
  Administered 2022-10-06: 6 mL

## 2022-10-06 MED ORDER — ACETAMINOPHEN 650 MG RE SUPP: 650.0000 mg | RECTAL | Status: DC | PRN

## 2022-10-06 MED ORDER — TRANEXAMIC ACID-NACL 1000-0.7 MG/100ML-% IV SOLN
INTRAVENOUS | Status: AC
  Filled 2022-10-06: qty 100

## 2022-10-06 MED ORDER — SODIUM CHLORIDE 0.9% FLUSH
3.0000 mL | Freq: Two times a day (BID) | INTRAVENOUS | Status: DC
  Administered 2022-10-06: 3 mL via INTRAVENOUS

## 2022-10-06 MED ORDER — PHENYLEPHRINE HCL-NACL 20-0.9 MG/250ML-% IV SOLN
INTRAVENOUS | Status: DC | PRN
  Administered 2022-10-06: 40 ug/min via INTRAVENOUS
  Administered 2022-10-06 (×2): 160 ug via INTRAVENOUS
  Administered 2022-10-06: 80 ug via INTRAVENOUS

## 2022-10-06 MED ORDER — FENTANYL CITRATE (PF) 100 MCG/2ML IJ SOLN
INTRAMUSCULAR | Status: AC
  Filled 2022-10-06: qty 2

## 2022-10-06 MED ORDER — EPINEPHRINE 0.3 MG/0.3ML IJ SOAJ: 0.3000 mg | Freq: Once | INTRAMUSCULAR | Status: DC

## 2022-10-06 MED ORDER — NALOXONE HCL 4 MG/0.1ML NA LIQD: 1.0000 | NASAL | Status: DC

## 2022-10-06 MED ORDER — FUROSEMIDE 20 MG PO TABS
20.0000 mg | ORAL_TABLET | ORAL | Status: DC
  Administered 2022-10-07: 20 mg via ORAL
  Filled 2022-10-06: qty 1

## 2022-10-06 MED ORDER — SUGAMMADEX SODIUM 200 MG/2ML IV SOLN
INTRAVENOUS | Status: DC | PRN
  Administered 2022-10-06: 200 mg via INTRAVENOUS

## 2022-10-06 MED ORDER — HYDROMORPHONE HCL 1 MG/ML IJ SOLN
INTRAMUSCULAR | Status: AC
  Filled 2022-10-06: qty 0.5

## 2022-10-06 MED ORDER — BUSPIRONE HCL 15 MG PO TABS
15.0000 mg | ORAL_TABLET | Freq: Three times a day (TID) | ORAL | Status: DC
  Administered 2022-10-06 – 2022-10-07 (×3): 15 mg via ORAL
  Filled 2022-10-06 (×3): qty 1

## 2022-10-06 MED ORDER — PRAMIPEXOLE DIHYDROCHLORIDE 0.25 MG PO TABS
0.1250 mg | ORAL_TABLET | Freq: Every day | ORAL | Status: DC
  Administered 2022-10-06: 0.125 mg via ORAL
  Filled 2022-10-06: qty 1

## 2022-10-06 MED ORDER — AMPHETAMINE-DEXTROAMPHETAMINE 15 MG PO TABS: 15.0000 mg | ORAL_TABLET | Freq: Two times a day (BID) | ORAL | Status: DC

## 2022-10-06 MED ORDER — MIDAZOLAM HCL 2 MG/2ML IJ SOLN
INTRAMUSCULAR | Status: DC | PRN
  Administered 2022-10-06: 2 mg via INTRAVENOUS

## 2022-10-06 MED ORDER — UMECLIDINIUM BROMIDE 62.5 MCG/ACT IN AEPB
1.0000 | INHALATION_SPRAY | Freq: Every day | RESPIRATORY_TRACT | Status: DC
  Filled 2022-10-06: qty 7

## 2022-10-06 MED ORDER — TRIAMCINOLONE ACETONIDE 0.1 % EX CREA: 1.0000 | TOPICAL_CREAM | Freq: Every day | CUTANEOUS | Status: DC | PRN

## 2022-10-06 MED ORDER — LINACLOTIDE 72 MCG PO CAPS: 72.0000 ug | ORAL_CAPSULE | Freq: Every day | ORAL | Status: DC | PRN

## 2022-10-06 MED ORDER — TOPIRAMATE 25 MG PO TABS
50.0000 mg | ORAL_TABLET | Freq: Two times a day (BID) | ORAL | Status: DC
  Administered 2022-10-06 – 2022-10-07 (×2): 50 mg via ORAL
  Filled 2022-10-06 (×2): qty 2

## 2022-10-06 MED ORDER — OXYCODONE HCL 5 MG PO TABS
10.0000 mg | ORAL_TABLET | ORAL | Status: DC | PRN
  Administered 2022-10-06 – 2022-10-07 (×5): 10 mg via ORAL
  Filled 2022-10-06 (×5): qty 2

## 2022-10-06 MED ORDER — CLORAZEPATE DIPOTASSIUM 3.75 MG PO TABS
3.7500 mg | ORAL_TABLET | Freq: Two times a day (BID) | ORAL | Status: DC
  Administered 2022-10-07 (×2): 3.75 mg via ORAL
  Filled 2022-10-06 (×2): qty 1

## 2022-10-06 MED ORDER — SEMAGLUTIDE (2 MG/DOSE) 8 MG/3ML ~~LOC~~ SOPN: 2.0000 mg | PEN_INJECTOR | SUBCUTANEOUS | Status: DC

## 2022-10-06 MED ORDER — PHENOL 1.4 % MT LIQD: 1.0000 | OROMUCOSAL | Status: DC | PRN

## 2022-10-06 MED ORDER — IPRATROPIUM-ALBUTEROL 20-100 MCG/ACT IN AERS: 1.0000 | INHALATION_SPRAY | Freq: Four times a day (QID) | RESPIRATORY_TRACT | Status: DC | PRN

## 2022-10-06 MED ORDER — INSULIN GLARGINE-YFGN 100 UNIT/ML ~~LOC~~ SOLN
45.0000 [IU] | Freq: Every day | SUBCUTANEOUS | Status: DC
  Administered 2022-10-06: 45 [IU] via SUBCUTANEOUS
  Filled 2022-10-06 (×2): qty 0.45

## 2022-10-06 MED ORDER — GABAPENTIN 300 MG PO CAPS
600.0000 mg | ORAL_CAPSULE | Freq: Three times a day (TID) | ORAL | Status: DC
  Administered 2022-10-06 – 2022-10-07 (×3): 600 mg via ORAL
  Filled 2022-10-06 (×3): qty 2

## 2022-10-06 MED ORDER — CEFAZOLIN SODIUM-DEXTROSE 1-4 GM/50ML-% IV SOLN
1.0000 g | Freq: Three times a day (TID) | INTRAVENOUS | Status: AC
  Administered 2022-10-06 – 2022-10-07 (×2): 1 g via INTRAVENOUS
  Filled 2022-10-06 (×2): qty 50

## 2022-10-06 MED ORDER — TRANEXAMIC ACID-NACL 1000-0.7 MG/100ML-% IV SOLN
INTRAVENOUS | Status: DC | PRN
  Administered 2022-10-06: 1000 mg via INTRAVENOUS

## 2022-10-06 MED ORDER — LIDOCAINE 2% (20 MG/ML) 5 ML SYRINGE
INTRAMUSCULAR | Status: DC | PRN
  Administered 2022-10-06: 80 mg via INTRAVENOUS

## 2022-10-06 MED ORDER — ORAL CARE MOUTH RINSE: 15.0000 mL | Freq: Once | OROMUCOSAL | Status: AC

## 2022-10-06 MED ORDER — FENTANYL CITRATE (PF) 250 MCG/5ML IJ SOLN
INTRAMUSCULAR | Status: AC
  Filled 2022-10-06: qty 5

## 2022-10-06 MED ORDER — FLUTICASONE PROPIONATE 50 MCG/ACT NA SUSP: 2.0000 | Freq: Every day | NASAL | Status: DC | PRN

## 2022-10-06 MED ORDER — IPRATROPIUM-ALBUTEROL 0.5-2.5 (3) MG/3ML IN SOLN: 3.0000 mL | Freq: Four times a day (QID) | RESPIRATORY_TRACT | Status: DC | PRN

## 2022-10-06 MED ORDER — BUPIVACAINE HCL (PF) 0.5 % IJ SOLN
INTRAMUSCULAR | Status: AC
  Filled 2022-10-06: qty 30

## 2022-10-06 MED ORDER — CEFAZOLIN SODIUM-DEXTROSE 2-4 GM/100ML-% IV SOLN
2.0000 g | INTRAVENOUS | Status: AC
  Administered 2022-10-06: 2 g via INTRAVENOUS
  Filled 2022-10-06: qty 100

## 2022-10-06 MED ORDER — ACETAMINOPHEN 10 MG/ML IV SOLN
INTRAVENOUS | Status: DC | PRN
  Administered 2022-10-06: 1000 mg via INTRAVENOUS

## 2022-10-06 MED ORDER — KETAMINE HCL 10 MG/ML IJ SOLN
INTRAMUSCULAR | Status: DC | PRN
  Administered 2022-10-06: 10 mg via INTRAVENOUS
  Administered 2022-10-06: 25 mg via INTRAVENOUS
  Administered 2022-10-06: 15 mg via INTRAVENOUS

## 2022-10-06 MED ORDER — FENTANYL CITRATE (PF) 100 MCG/2ML IJ SOLN
25.0000 ug | INTRAMUSCULAR | Status: DC | PRN
  Administered 2022-10-06 (×2): 25 ug via INTRAVENOUS

## 2022-10-06 MED ORDER — LEFLUNOMIDE 10 MG PO TABS
20.0000 mg | ORAL_TABLET | Freq: Every day | ORAL | Status: DC
  Administered 2022-10-07: 20 mg via ORAL
  Filled 2022-10-06: qty 2

## 2022-10-06 MED ORDER — VITAMIN D (ERGOCALCIFEROL) 1.25 MG (50000 UNIT) PO CAPS: 50000.0000 [IU] | ORAL_CAPSULE | ORAL | Status: DC

## 2022-10-06 MED ORDER — PRAVASTATIN SODIUM 40 MG PO TABS
40.0000 mg | ORAL_TABLET | Freq: Every day | ORAL | Status: DC
  Administered 2022-10-07: 40 mg via ORAL
  Filled 2022-10-06: qty 1

## 2022-10-06 MED ORDER — MONTELUKAST SODIUM 10 MG PO TABS
10.0000 mg | ORAL_TABLET | Freq: Every day | ORAL | Status: DC
  Administered 2022-10-06: 10 mg via ORAL
  Filled 2022-10-06: qty 1

## 2022-10-06 MED ORDER — CYCLOBENZAPRINE HCL 10 MG PO TABS
10.0000 mg | ORAL_TABLET | Freq: Every day | ORAL | Status: DC
  Administered 2022-10-06: 10 mg via ORAL
  Filled 2022-10-06: qty 1

## 2022-10-06 MED ORDER — PROPOFOL 10 MG/ML IV BOLUS
INTRAVENOUS | Status: DC | PRN
  Administered 2022-10-06: 140 mg via INTRAVENOUS

## 2022-10-06 MED ORDER — SODIUM CHLORIDE 0.45 % IV SOLN: INTRAVENOUS | Status: DC

## 2022-10-06 MED ORDER — MENTHOL 3 MG MT LOZG
1.0000 | LOZENGE | OROMUCOSAL | Status: DC | PRN
  Administered 2022-10-07: 3 mg via ORAL
  Filled 2022-10-06: qty 9

## 2022-10-06 MED ORDER — DOXYCYCLINE HYCLATE 100 MG PO TABS
100.0000 mg | ORAL_TABLET | Freq: Two times a day (BID) | ORAL | Status: DC
  Administered 2022-10-06 – 2022-10-07 (×2): 100 mg via ORAL
  Filled 2022-10-06 (×2): qty 1

## 2022-10-06 MED ORDER — INSULIN ASPART 100 UNIT/ML IJ SOLN: 0.0000 [IU] | INTRAMUSCULAR | Status: DC | PRN

## 2022-10-06 MED ORDER — IPRATROPIUM-ALBUTEROL 0.5-2.5 (3) MG/3ML IN SOLN
3.0000 mL | Freq: Once | RESPIRATORY_TRACT | Status: AC
  Administered 2022-10-06: 3 mL via RESPIRATORY_TRACT
  Filled 2022-10-06: qty 3

## 2022-10-06 MED ORDER — ACETAMINOPHEN 325 MG PO TABS
650.0000 mg | ORAL_TABLET | ORAL | Status: DC | PRN
  Administered 2022-10-07: 650 mg via ORAL
  Filled 2022-10-06: qty 2

## 2022-10-06 SURGICAL SUPPLY — 60 items
AGENT HMST KT MTR STRL THRMB (HEMOSTASIS) ×1
APL SKNCLS STERI-STRIP NONHPOA (GAUZE/BANDAGES/DRESSINGS) ×1
BAG COUNTER SPONGE SURGICOUNT (BAG) ×2 IMPLANT
BAG SPNG CNTER NS LX DISP (BAG) ×1
BENZOIN TINCTURE PRP APPL 2/3 (GAUZE/BANDAGES/DRESSINGS) ×2 IMPLANT
BIT DRILL W/STOP SM 12 (BIT) IMPLANT
BLADE CLIPPER SURG (BLADE) IMPLANT
BONE CC-ACS 11X14X7 6D (Bone Implant) ×3 IMPLANT
BUR ROUND FLUTED 4 SOFT TCH (BURR) ×2 IMPLANT
CHIPS BONE CANC-ACS11X14X7 6D (Bone Implant) IMPLANT
COLLAR CERV LO CONTOUR FIRM DE (SOFTGOODS) ×2 IMPLANT
CORD BIPOLAR FORCEPS 12FT (ELECTRODE) IMPLANT
COVER MAYO STAND STRL (DRAPES) IMPLANT
COVER SURGICAL LIGHT HANDLE (MISCELLANEOUS) ×2 IMPLANT
DRAPE C-ARM 42X72 X-RAY (DRAPES) ×2 IMPLANT
DRAPE HALF SHEET 40X57 (DRAPES) ×2 IMPLANT
DRAPE MICROSCOPE SLANT 54X150 (MISCELLANEOUS) ×2 IMPLANT
DURAPREP 6ML APPLICATOR 50/CS (WOUND CARE) ×2 IMPLANT
ELECT COATED BLADE 2.86 ST (ELECTRODE) ×2 IMPLANT
ELECT REM PT RETURN 9FT ADLT (ELECTROSURGICAL) ×1
ELECTRODE REM PT RTRN 9FT ADLT (ELECTROSURGICAL) ×2 IMPLANT
EVACUATOR 1/8 PVC DRAIN (DRAIN) ×2 IMPLANT
GAUZE SPONGE 4X4 12PLY STRL (GAUZE/BANDAGES/DRESSINGS) ×2 IMPLANT
GAUZE XEROFORM 1X8 LF (GAUZE/BANDAGES/DRESSINGS) ×2 IMPLANT
GLOVE BIOGEL PI IND STRL 8 (GLOVE) ×4 IMPLANT
GLOVE ORTHO TXT STRL SZ7.5 (GLOVE) ×4 IMPLANT
GOWN STRL REUS W/ TWL LRG LVL3 (GOWN DISPOSABLE) ×2 IMPLANT
GOWN STRL REUS W/ TWL XL LVL3 (GOWN DISPOSABLE) ×2 IMPLANT
GOWN STRL REUS W/TWL 2XL LVL3 (GOWN DISPOSABLE) ×2 IMPLANT
GOWN STRL REUS W/TWL LRG LVL3 (GOWN DISPOSABLE) ×1
GOWN STRL REUS W/TWL XL LVL3 (GOWN DISPOSABLE) ×1
HALTER HD/CHIN CERV TRACTION D (MISCELLANEOUS) ×2 IMPLANT
HEMOSTAT SURGICEL 2X14 (HEMOSTASIS) IMPLANT
KIT BASIN OR (CUSTOM PROCEDURE TRAY) ×2 IMPLANT
KIT TURNOVER KIT B (KITS) ×2 IMPLANT
MANIFOLD NEPTUNE II (INSTRUMENTS) ×2 IMPLANT
NDL 25GX 5/8IN NON SAFETY (NEEDLE) ×2 IMPLANT
NEEDLE 25GX 5/8IN NON SAFETY (NEEDLE) ×1 IMPLANT
NS IRRIG 1000ML POUR BTL (IV SOLUTION) ×2 IMPLANT
PACK ORTHO CERVICAL (CUSTOM PROCEDURE TRAY) ×2 IMPLANT
PAD ARMBOARD 7.5X6 YLW CONV (MISCELLANEOUS) ×4 IMPLANT
PATTIES SURGICAL .5 X.5 (GAUZE/BANDAGES/DRESSINGS) IMPLANT
PIN TEMP FIXATION KIRSCHNER (EXFIX) IMPLANT
PLATE ANT CERV XTEND 3 LV 45 (Plate) IMPLANT
POSITIONER HEAD DONUT 9IN (MISCELLANEOUS) ×2 IMPLANT
SCREW XTD VAR 4.2 SELF TAP 12 (Screw) IMPLANT
STRIP CLOSURE SKIN 1/2X4 (GAUZE/BANDAGES/DRESSINGS) ×2 IMPLANT
SURGIFLO W/THROMBIN 8M KIT (HEMOSTASIS) IMPLANT
SUT BONE WAX W31G (SUTURE) ×2 IMPLANT
SUT PROLENE 4 0 RB 1 (SUTURE) ×1
SUT PROLENE 4-0 RB1 .5 CRCL 36 (SUTURE) IMPLANT
SUT VIC AB 3-0 X1 27 (SUTURE) ×2 IMPLANT
SUT VIC AB 4-0 PS2 18 (SUTURE) IMPLANT
SUT VICRYL 4-0 PS2 18IN ABS (SUTURE) ×4 IMPLANT
SYR 30ML SLIP (SYRINGE) ×2 IMPLANT
SYR BULB EAR ULCER 3OZ GRN STR (SYRINGE) ×2 IMPLANT
TAPE CLOTH SOFT 2X10 (GAUZE/BANDAGES/DRESSINGS) IMPLANT
TOWEL GREEN STERILE (TOWEL DISPOSABLE) ×2 IMPLANT
TOWEL GREEN STERILE FF (TOWEL DISPOSABLE) ×2 IMPLANT
WATER STERILE IRR 1000ML POUR (IV SOLUTION) ×2 IMPLANT

## 2022-10-06 NOTE — Transfer of Care (Signed)
Immediate Anesthesia Transfer of Care Note  Patient: Kimberly Jenkins  Procedure(s) Performed: C3-4, C4-5, C5-6 ANTERIOR CERVICAL DISCECTOMY FUSION, ALLOGRAFT, PLATE  Patient Location: PACU  Anesthesia Type:General  Level of Consciousness: sedated and drowsy  Airway & Oxygen Therapy: Patient Spontanous Breathing and Patient connected to face mask oxygen  Post-op Assessment: Report given to RN, Post -op Vital signs reviewed and stable, and Patient moving all extremities  Post vital signs: Reviewed and stable  Last Vitals:  Vitals Value Taken Time  BP 128/61 10/06/22 1151  Temp    Pulse 95 10/06/22 1154  Resp 13 10/06/22 1154  SpO2 97 % 10/06/22 1154  Vitals shown include unvalidated device data.  Last Pain:  Vitals:   10/06/22 0607  TempSrc:   PainSc: 0-No pain      Patients Stated Pain Goal: 0 ([id number])  Complications: No notable events documented.

## 2022-10-06 NOTE — Interval H&P Note (Signed)
History and Physical Interval Note:  10/06/2022 7:27 AM  Kimberly Jenkins  has presented today for surgery, with the diagnosis of cervical stenosis, myelopathy C3-4, C4-5, C5-6.  The various methods of treatment have been discussed with the patient and family. After consideration of risks, benefits and other options for treatment, the patient has consented to  Procedure(s) with comments: C3-4, C4-5, C5-6 ANTERIOR CERVICAL DISCECTOMY FUSION, ALLOGRAFT, PLATE (N/A) - Needs RNFA as a surgical intervention.  The patient's history has been reviewed, patient examined, no change in status, stable for surgery.  I have reviewed the patient's chart and labs.  Questions were answered to the patient's satisfaction.     Marybelle Killings

## 2022-10-06 NOTE — Progress Notes (Signed)
Patient ID: Kimberly Jenkins, female   DOB: 09-24-1965, 57 y.o.   MRN: [id number] Postop check good relief of preop C6 numbness.  Biceps wrist extension is strong right and left in recovery room.  Good strength lower extremities.  No lower extremity hyperreflexia.  Patient states her right arm pain is gone.  Slight blood in the tubing has reached the reservoir of the Hemovac.  Neck soft.

## 2022-10-06 NOTE — Anesthesia Procedure Notes (Signed)
Procedure Name: Intubation Date/Time: 10/06/2022 7:47 AM  Performed by: Elvin So, CRNAPre-anesthesia Checklist: Patient identified, Emergency Drugs available, Suction available and Patient being monitored Patient Re-evaluated:Patient Re-evaluated prior to induction Oxygen Delivery Method: Circle System Utilized Preoxygenation: Pre-oxygenation with 100% oxygen Induction Type: IV induction Ventilation: Mask ventilation without difficulty Laryngoscope Size: Glidescope and 3 Grade View: Grade I Tube type: Oral Tube size: 7.0 mm Number of attempts: 1 Airway Equipment and Method: Stylet and Oral airway Placement Confirmation: ETT inserted through vocal cords under direct vision, positive ETCO2 and breath sounds checked- equal and bilateral Secured at: 21 cm Tube secured with: Tape Dental Injury: Teeth and Oropharynx as per pre-operative assessment

## 2022-10-06 NOTE — Discharge Instructions (Signed)
Your collar stays on at all times except when you take a shower.  You will have the extra collar wrapped in Saran wrap and remove the dry collar is on your neck without moving her neck and apply the Saran wrap collar.  He can take a shower after he finished showering get out dry off and then switch colors back without moving her neck.  He will see Dr. Lorin Mercy in 2 weeks.  Pain medication has been sent in to your pharmacy.  You will do better staying with soft foods and liquids since it will be hard to swallow for a few weeks.  He will do better sleeping in a recliner or beachchair type position propped up to minimize neck swelling and neck discomfort.

## 2022-10-06 NOTE — Progress Notes (Signed)
RT NOTE:  Pt does not want to wear CPAP tonight. She is going to wear Kimberly Jenkins. She has a foam collar on her neck due to surgery today. RN aware. RT available if patient changes her mind.

## 2022-10-06 NOTE — Anesthesia Postprocedure Evaluation (Signed)
Anesthesia Post Note  Patient: Kimberly Jenkins  Procedure(s) Performed: C3-4, C4-5, C5-6 ANTERIOR CERVICAL DISCECTOMY FUSION, ALLOGRAFT, PLATE     Patient location during evaluation: PACU Anesthesia Type: General Level of consciousness: awake and alert Pain management: pain level controlled Vital Signs Assessment: post-procedure vital signs reviewed and stable Respiratory status: spontaneous breathing, nonlabored ventilation, respiratory function stable and patient connected to nasal cannula oxygen Cardiovascular status: blood pressure returned to baseline and stable Postop Assessment: no apparent nausea or vomiting Anesthetic complications: no   No notable events documented.  Last Vitals:  Vitals:   10/06/22 1300 10/06/22 1334  BP: 128/73 131/73  Pulse: 97 89  Resp: 20 17  Temp: 36.9 C 37.1 C  SpO2: 95% 94%    Last Pain:  Vitals:   10/06/22 1334  TempSrc: Oral  PainSc:                  Santa Lighter

## 2022-10-06 NOTE — Op Note (Signed)
Pre and postop diagnosis: Cervical disc protrusion C3-4 C4-5 C5-6.  Procedure: Three-level anterior cervical discectomy and fusion C3-4, C4-5, C5-6 with allograft and plate (3 level plate)  Surgeon: Rodell Perna, MD  Assistant: Wandra Arthurs, RNFA  Anesthesia General orotracheal +6 cc local.  Implants:mplants  BONE CC-ACS [id number] - [id number]  Inventory Item: BONE CC-ACS [id number] Serial no.: [id number] Model/Cat no.: [id number]  Implant name: BONE CC-ACS [id number] - [id number] Laterality: N/A Area: Cervical Level 3-4  Manufacturer: Gerilyn Nestle FNDN Date of Manufacture:   Action: Implanted Number Used: 1   Device Identifier: Device Identifier Type:   BONE CC-ACS [id number] - [id number]  Inventory Item: BONE CC-ACS [id number] 6D Serial no.: [id number] Model/Cat no.: [id number]  Implant name: BONE CC-ACS [id number] - [id number] Laterality: N/A Area: Cervical Level 4-5  Manufacturer: Gerilyn Nestle FNDN Date of Manufacture:   Action: Implanted Number Used: 1   Device Identifier: Device Identifier Type:   BONE CC-ACS [id number] - [id number]  Inventory Item: BONE CC-ACS [id number] 6D Serial no.: [id number] Model/Cat no.: [id number]  Implant name: BONE CC-ACS [id number] 6D - [id number] Laterality: N/A Area: Cervical Level 5-6  Manufacturer: Gerilyn Nestle FNDN Date of Manufacture:   Action: Implanted Number Used: 1   Device Identifier: Device Identifier Type:   SCREW XTD VAR 4.2 SELF TAP 12 [id number]  Inventory Item: SCREW XTD VAR 4.2 SELF TAP 12 Serial no.: Model/Cat no.[id number]  Implant name: SCREW XTD VAR 4.2 SELF TAP 12 - [id number] Laterality: N/A Area: Cervical Level 5-6  Manufacturer: [place] Date of Manufacture:   Action: Implanted Number Used: 8   Device Identifier: Device Identifier Type:   PLATE ANT CERV XTEND 3 LV 45 [id number]  Inventory Item: PLATE ANT CERV XTEND 3 LV 45 Serial no.: Model/Cat no.[id number]  Implant name: PLATE ANT CERV XTEND 3 LV 45 - [id number] Laterality: N/A Area: Cervical Level 5-6  Manufacturer: [place] Date of Manufacture:   Action: Implanted Number Used: 1   Device Identifier: Device Identifier Type:    Trays   Complications 1 mm dural tear at C3-4.  Brief history patient with disc protrusions right C3-4 central C4-5 and left as well as right C5-6.  Patient had electrical studies that were consistent with right C6 nerve root with changes in the biceps and ECR L muscle with numbness C6 distribution.  Patient had conservative treatment she is diabetic.  She had some mild balance problems with turning and twisting but did not have wide-based gait.  Denied lower extremity weakness no lower extremity clonus.  Procedure: After induction of general anesthesia orotracheal ovation had ultra traction without weight neck was prepped arms were tucked at the side with yellow pads over the ulnar nerve wrist restraints were applied for pulldown.  Neck was prepped with DuraPrep, a gel bag and then placed behind the shoulder blades.  Area squared with towels sterile skin marker sterile Mayo stand at the head one half of a Betadine Vi-Drape thyroid sheets and draped and timeout procedure completed Ancef prophylaxis.  IV TXA was given during the case.  Incision was made based on palpable landmarks starting to midline extending to the left platysma was divided in line with the fibers blunt dissection down to prominent spurs and initially short 25 needle straight clamp was placed at the C4-5 interspace.  We moved up 1 level and started at C3-4 took second image confirming the C3-4.  There was bone growing over the tops of the  disc space and minimal motion noted at the disc base.  Bur had to be used and bare rongeurs to remove some spurs to find the disc base and we had to stop a couple times even using the operative microscope and used Clarick curettes laterally right and left to make  sure were staying at the level of the disc space.  Close to leave there was some calcification posterior longitudinal ligament.  Decompression central and right left was down to the to the dura on the left.  On the right side it was adherent and trying to remove some of the spurs 1 mm smaller dural tear occurred with some CSF fluid.  Patties were applied and uncovertebral joints were stripped out laterally.  We came back and went down from below try to go out around to the side and there was some bone still adherent to the dura 3 mm area.  Went cephalad slightly more and came across the tops of the piece of bone was mobile not to was not pressing.  Trial sizers showed a 7 mm graft fit appropriately.  Some graft was placed.  No CSF leak was present at this time.  Some Surgiflo been used intermittently for some epidural bone bleeding as well as some bone wax.  All bone wax and Surgiflo was removed before placing the graft.  Self-retaining Cloward retractor deep blades right and left smooth blade cephalad caudad we then moved to C4-5 we repeated this level.  She 4-5 level also took a 7 mm graft.  There was dura protruding centrally and complete decompression of the dura was performed stripping the uncovertebral joints using 1 and 2 mm Kerrisons using the operative microscope.  Some millimeter graft was placed in the anterior spurs were then trimmed down so plate was set on flush.  Slightly smaller spurs are present at C5-6 at this level there is more disc protruding out of the left than right.  Posterior longitudinal ligament was decompressed exposing the dura spurs were removed 7 mm graft was also the appropriate size.  A plate was selected 3 level extend globus plate with 12 mm screws x 8.  Checked under C arm held with a small plate prong to make sure the plate was adjusted appropriately and was midline on AP good position lateral.  Screws were placed rechecked with the C arm was sterilely draped and filled all  holes.  Repeat spot films images with arm pulldown showed good position of graft plate screws plate was midline all screws are solidly in bone.  Epidural space was dry at the time of graft placement.  Operative field right and left sides were carefully checked.  There was prominent vein on the left side and one small branch was coagulated with bipolar cautery.  Hemovacs placed in and out technique on the left in line with skin incision platysma closed with 3-0 Vicryl 4-0 Vicryl subcuticular closure tincture benzoin Steri-Strips Marcaine infiltration 4 x 4's tape and soft collar was applied.  Instrument count needle count was correct.  Patient tolerated procedure well transferred recovery in stable condition.

## 2022-10-07 LAB — GLUCOSE, CAPILLARY: Glucose-Capillary: 133 mg/dL — ABNORMAL HIGH (ref 70–99)

## 2022-10-07 NOTE — Progress Notes (Signed)
Patient is discharged from room 3C08 at this time. Alert and in stable condition. IV site d/c'd and instructions read to patient with understanding verbalized and all questions answered. Left unit via wheelchair with all belongings at side. 

## 2022-10-07 NOTE — Care Management (Signed)
Patient with order to DC to home today. Unit staff to provide DME needed for home.   No HH needs identified   Patient will have family/ friends provide transportation home. No other TOC needs identified for DC

## 2022-10-07 NOTE — Discharge Summary (Signed)
Discharge Diagnoses:  Active Problems:   Protrusion of cervical intervertebral disc   Surgeries: Procedure(s): C3-4, C4-5, C5-6 ANTERIOR CERVICAL DISCECTOMY FUSION, ALLOGRAFT, PLATE on [id number]    Consultants:   Discharged Condition: Improved  Hospital Course: Kimberly Jenkins is an 57 y.o. female who was admitted 10/06/2022 with a chief complaint of cervical stenosis, with a final diagnosis of cervical stenosis, myelopathy C3-4, C4-5, C5-6.  Patient was brought to the operating room on 10/06/2022 and underwent Procedure(s): C3-4, C4-5, C5-6 ANTERIOR CERVICAL DISCECTOMY FUSION, ALLOGRAFT, PLATE.    Patient was given perioperative antibiotics:  Anti-infectives (From admission, onward)    Start     Dose/Rate Route Frequency Ordered Stop   10/06/22 2200  doxycycline (VIBRA-TABS) tablet 100 mg        100 mg Oral 2 times daily 10/06/22 1330     10/06/22 1600  ceFAZolin (ANCEF) IVPB 1 g/50 mL premix        1 g 100 mL/hr over 30 Minutes Intravenous Every 8 hours 10/06/22 1244 10/07/22 0103   10/06/22 0600  ceFAZolin (ANCEF) IVPB 2g/100 mL premix        2 g 200 mL/hr over 30 Minutes Intravenous On call to O.R. 10/06/22 0544 10/06/22 0800     .  Patient was given sequential compression devices, early ambulation, and aspirin for DVT prophylaxis.  Recent vital signs: Patient Vitals for the past 24 hrs:  BP Temp Temp src Pulse Resp SpO2  10/07/22 0834 121/73 98.4 F (36.9 C) Oral 78 18 98 %  10/07/22 0201 121/70 98.4 F (36.9 C) Oral 76 20 98 %  10/06/22 2324 112/63 98.6 F (37 C) Oral 73 18 98 %  10/06/22 2005 114/60 98.8 F (37.1 C) Oral 76 20 95 %  10/06/22 1334 131/73 98.7 F (37.1 C) Oral 89 17 94 %  10/06/22 1300 128/73 98.5 F (36.9 C) -- 97 20 95 %  10/06/22 1245 122/72 -- -- 97 18 92 %  10/06/22 1230 109/63 -- -- 90 12 94 %  10/06/22 1215 119/67 -- -- 96 10 95 %  10/06/22 1200 129/68 -- -- (!) 104 10 93 %  10/06/22 1150 128/61 98.5 F (36.9 C) -- 96 10 95 %   .  Recent laboratory studies: DG Cervical Spine 2 or 3 views  Result Date: 10/06/2022 CLINICAL DATA:  Elective surgery, C3-C6 anterior cervical discectomy and fusion EXAM: CERVICAL SPINE - 2-3 VIEW COMPARISON:  07/04/2022 Fluoroscopy time: 0 minutes 20.4 seconds Dose: 3.82 mGy Images: 3 FINDINGS: Anterior plate and screws identified at C3-C6. Bone plugs present at C3-C4 and C4-C5, with C5-C6 obscured by shoulders. No fracture or subluxation. IMPRESSION: Anterior fusion C3-C6. Electronically Signed   By: Lavonia Dana M.D.   On: 10/06/2022 12:32   DG C-Arm 1-60 Min-No Report  Result Date: 10/06/2022 Fluoroscopy was utilized by the requesting physician.  No radiographic interpretation.   DG C-Arm 1-60 Min-No Report  Result Date: 10/06/2022 Fluoroscopy was utilized by the requesting physician.  No radiographic interpretation.   DG C-Arm 1-60 Min-No Report  Result Date: 10/06/2022 Fluoroscopy was utilized by the requesting physician.  No radiographic interpretation.   DG C-Arm 1-60 Min-No Report  Result Date: 10/06/2022 Fluoroscopy was utilized by the requesting physician.  No radiographic interpretation.    Discharge Medications:   Allergies as of 10/07/2022       Reactions   Methotrexate Diarrhea   Advair Diskus [fluticasone-salmeterol] Itching, Nausea And Vomiting   Aspirin Nausea And Vomiting  Ciprofloxacin Nausea And Vomiting   Cephalexin Nausea And Vomiting   Clonidine Derivatives Rash   Patches blistered skin        Medication List     TAKE these medications    ALIGN PO Take 1 tablet by mouth daily.   amphetamine-dextroamphetamine 15 MG tablet Commonly known as: ADDERALL Take 15 mg by mouth 2 (two) times daily.   Breo Ellipta 200-25 MCG/ACT Aepb Generic drug: fluticasone furoate-vilanterol Inhale one puff once daily   busPIRone 15 MG tablet Commonly known as: BUSPAR Take 15 mg by mouth 3 (three) times daily.   clorazepate 3.75 MG tablet Commonly known as:  TRANXENE Take 3.75 mg by mouth in the morning, at noon, and at bedtime.   cyclobenzaprine 10 MG tablet Commonly known as: FLEXERIL Take 10 mg by mouth at bedtime.   doxycycline 100 MG tablet Commonly known as: VIBRA-TABS Take 100 mg by mouth 2 (two) times daily.   EPINEPHrine 0.3 mg/0.3 mL Soaj injection Commonly known as: EpiPen 2-Pak Use as directed for life-threatening allergic reaction.   famotidine 20 MG tablet Commonly known as: PEPCID Take 20 mg by mouth at bedtime.   FLUoxetine 40 MG capsule Commonly known as: PROZAC Take 80 mg by mouth daily.   fluticasone 50 MCG/ACT nasal spray Commonly known as: FLONASE Place 2 sprays into both nostrils daily as needed for allergies.   furosemide 40 MG tablet Commonly known as: LASIX Take 20 mg by mouth every other day.   gabapentin 600 MG tablet Commonly known as: NEURONTIN Take 600 mg by mouth 3 (three) times daily.   Humira (2 Pen) 40 MG/0.8ML Pnkt Generic drug: Adalimumab INJECT 40 MG INTO THE SKIN EVERY 14 DAYS   icosapent Ethyl 1 g capsule Commonly known as: VASCEPA Take 1 g by mouth 2 (two) times daily.   Ipratropium-Albuterol 20-100 MCG/ACT Aers respimat Commonly known as: COMBIVENT Inhale 1 puff into the lungs every 6 (six) hours as needed for wheezing.   ipratropium-albuterol 0.5-2.5 (3) MG/3ML Soln Commonly known as: DUONEB CAN USE ONE VIAL IN NEBULIZER EVERY 4-6 HOURS AS NEEDED FOR COUGH OR WHEEZE   Lantus SoloStar 100 UNIT/ML Solostar Pen Generic drug: insulin glargine Inject 45 Units into the skin at bedtime. Hold if blood sugar is <130   leflunomide 10 MG tablet Commonly known as: ARAVA Take 2 tablets (20 mg total) by mouth daily.   levothyroxine 50 MCG tablet Commonly known as: SYNTHROID Take 50 mcg by mouth daily before breakfast.   Linzess 72 MCG capsule Generic drug: linaclotide Take 72 mcg by mouth daily as needed (constipation).   mesalamine 1.2 g EC tablet Commonly known as:  LIALDA Take 2.4 g by mouth daily.   montelukast 10 MG tablet Commonly known as: SINGULAIR Take 10 mg by mouth at bedtime.   naloxone 4 MG/0.1ML Liqd nasal spray kit Commonly known as: NARCAN Place 1 spray into the nose as directed.   Nucala 100 MG injection Generic drug: mepolizumab INJECT 100MG SUBCUTANEOUSLY EVERY 4 WEEKS   nystatin 100000 UNIT/ML suspension Commonly known as: MYCOSTATIN Swish and spit with 5 mls after Breo/Spiriva use.   olopatadine 0.1 % ophthalmic solution Commonly known as: PATANOL CAN USE ONE DROP IN EACH EYE TWICE DAILY IF NEEDED FOR RED, ITCHY, WATERY EYES.   omeprazole 40 MG capsule Commonly known as: PRILOSEC Take 40 mg by mouth 2 (two) times daily.   ondansetron 8 MG tablet Commonly known as: ZOFRAN Take 8 mg by mouth every 8 (eight) hours  as needed for nausea or vomiting.   Oxycodone HCl 10 MG Tabs Take 10 mg by mouth every 8 (eight) hours.   oxyCODONE-acetaminophen 5-325 MG tablet Commonly known as: Percocet Take 1-2 tablets by mouth every 6 (six) hours as needed for severe pain.   Ozempic (2 MG/DOSE) 8 MG/3ML Sopn Generic drug: Semaglutide (2 MG/DOSE) Inject 2 mg into the skin every Monday.   potassium chloride SA 20 MEQ tablet Commonly known as: KLOR-CON M Take 20 mEq by mouth 2 (two) times daily.   pramipexole 0.125 MG tablet Commonly known as: MIRAPEX Take 0.125 mg by mouth at bedtime.   pravastatin 40 MG tablet Commonly known as: PRAVACHOL Take 40 mg by mouth daily.   primidone 250 MG tablet Commonly known as: MYSOLINE Take 250 mg by mouth at bedtime.   promethazine-dextromethorphan 6.25-15 MG/5ML syrup Commonly known as: PROMETHAZINE-DM Take 5 mLs by mouth 4 (four) times daily as needed for cough.   Rexulti 2 MG Tabs tablet Generic drug: brexpiprazole Take 2 mg by mouth daily.   Spiriva Respimat 1.25 MCG/ACT Aers Generic drug: Tiotropium Bromide Monohydrate Inhale two puffs once daily What changed:  how much to  take how to take this when to take this additional instructions   topiramate 50 MG tablet Commonly known as: TOPAMAX Take 50 mg by mouth 2 (two) times daily.   triamcinolone cream 0.1 % Commonly known as: KENALOG Apply 1 application  topically daily as needed (cracks at the corners of her mouth).   Vitamin D (Ergocalciferol) 1.25 MG (50000 UNIT) Caps capsule Commonly known as: DRISDOL Take 50,000 Units by mouth every Sunday.        Diagnostic Studies: DG Cervical Spine 2 or 3 views  Result Date: 10/06/2022 CLINICAL DATA:  Elective surgery, C3-C6 anterior cervical discectomy and fusion EXAM: CERVICAL SPINE - 2-3 VIEW COMPARISON:  07/04/2022 Fluoroscopy time: 0 minutes 20.4 seconds Dose: 3.82 mGy Images: 3 FINDINGS: Anterior plate and screws identified at C3-C6. Bone plugs present at C3-C4 and C4-C5, with C5-C6 obscured by shoulders. No fracture or subluxation. IMPRESSION: Anterior fusion C3-C6. Electronically Signed   By: Lavonia Dana M.D.   On: 10/06/2022 12:32   DG C-Arm 1-60 Min-No Report  Result Date: 10/06/2022 Fluoroscopy was utilized by the requesting physician.  No radiographic interpretation.   DG C-Arm 1-60 Min-No Report  Result Date: 10/06/2022 Fluoroscopy was utilized by the requesting physician.  No radiographic interpretation.   DG C-Arm 1-60 Min-No Report  Result Date: 10/06/2022 Fluoroscopy was utilized by the requesting physician.  No radiographic interpretation.   DG C-Arm 1-60 Min-No Report  Result Date: 10/06/2022 Fluoroscopy was utilized by the requesting physician.  No radiographic interpretation.   MM DIAG BREAST TOMO BILATERAL  Result Date: 09/11/2022 CLINICAL DATA:  57 year old female for 2 year follow-up of LEFT breast asymmetry without sonographic correlate, and for annual bilateral mammogram. EXAM: DIGITAL DIAGNOSTIC BILATERAL MAMMOGRAM WITH TOMOSYNTHESIS TECHNIQUE: Bilateral digital diagnostic mammography and breast tomosynthesis was performed.  COMPARISON:  Previous exam(s). ACR Breast Density Category b: There are scattered areas of fibroglandular density. FINDINGS: Full field views of both breast demonstrate less conspicuous asymmetry within the posterior UPPER RIGHT breast identified on the MLO view only. No new or suspicious findings within either breast noted. IMPRESSION: 1. Less conspicuous RIGHT breast asymmetry over a 2 year period, compatible with a benign process. No further follow-up imaging recommended. 2. No mammographic evidence of breast malignancy. RECOMMENDATION: Bilateral screening mammogram in 1 year. I have discussed the findings and  recommendations with the patient. If applicable, a reminder letter will be sent to the patient regarding the next appointment. BI-RADS CATEGORY  2: Benign. Electronically Signed   By: Margarette Canada M.D.   On: 09/11/2022 08:37   Patient benefited maximally from their hospital stay and there were no complications.     Disposition: Discharge disposition: 01-Home or Self Care      Discharge Instructions     Call MD / Call 911   Complete by: As directed    If you experience chest pain or shortness of breath, CALL 911 and be transported to the hospital emergency room.  If you develope a fever above 101 F, pus (white drainage) or increased drainage or redness at the wound, or calf pain, call your surgeon's office.   Constipation Prevention   Complete by: As directed    Drink plenty of fluids.  Prune juice may be helpful.  You may use a stool softener, such as Colace (over the counter) 100 mg twice a day.  Use MiraLax (over the counter) for constipation as needed.   Diet - low sodium heart healthy   Complete by: As directed    Incentive spirometry RT   Complete by: As directed    Increase activity slowly as tolerated   Complete by: As directed    Post-operative opioid taper instructions:   Complete by: As directed    POST-OPERATIVE OPIOID TAPER INSTRUCTIONS: It is important to wean off of  your opioid medication as soon as possible. If you do not need pain medication after your surgery it is ok to stop day one. Opioids include: Codeine, Hydrocodone(Norco, Vicodin), Oxycodone(Percocet, oxycontin) and hydromorphone amongst others.  Long term and even short term use of opiods can cause: Increased pain response Dependence Constipation Depression Respiratory depression And more.  Withdrawal symptoms can include Flu like symptoms Nausea, vomiting And more Techniques to manage these symptoms Hydrate well Eat regular healthy meals Stay active Use relaxation techniques(deep breathing, meditating, yoga) Do Not substitute Alcohol to help with tapering If you have been on opioids for less than two weeks and do not have pain than it is ok to stop all together.  Plan to wean off of opioids This plan should start within one week post op of your joint replacement. Maintain the same interval or time between taking each dose and first decrease the dose.  Cut the total daily intake of opioids by one tablet each day Next start to increase the time between doses. The last dose that should be eliminated is the evening dose.          Follow-up Information     Marybelle Killings, MD Follow up in 2 week(s).   Specialty: Orthopedic Surgery Contact information: [address] [place] [address] [phone]                  Signed: Newt Minion 10/07/2022, 9:13 AM

## 2022-10-07 NOTE — Progress Notes (Signed)
PT Cancellation Note  Patient Details Name: Kimberly Jenkins MRN: [id number] DOB: 11/17/1965   Cancelled Treatment:    Reason Eval/Treat Not Completed: PT screened, no needs identified, will sign off Discussed with OT and pt is mobilizing well with no balance issues and performed stir training with OT without difficulty.  No acute PT needs identified, therefore will sign off.     Melvern Banker 10/07/2022, 9:47 AM [place]  Office [phone] 10/07/2022

## 2022-10-07 NOTE — Progress Notes (Signed)
Orthopedic Tech Progress Note Patient Details:  Kimberly Jenkins November 12, 1965 [id number] Extra soft collar delivered to patient's room  Ortho Devices Type of Ortho Device: Soft collar Ortho Device/Splint Interventions: Ordered      Kimberly Jenkins Kimberly Jenkins 10/07/2022, 7:06 AM

## 2022-10-07 NOTE — Evaluation (Signed)
Occupational Therapy Evaluation Patient Details Name: Kimberly Jenkins MRN: [id number] DOB: 09-Jul-1966 Today's Date: 10/07/2022   History of Present Illness Pt is a 57 yo female s/p C3-4, C4-5, C5-6 ACDF. PMHx: DM, fibromyalgia.   Clinical Impression   PLOF: Pt independent with SPC for mobility at home and reports being retired. Pt independent with  ADL tasks. Pt CLOF: Pt performing LB dressing with figure 4 technique (bringing ankle over knees) to reduce bending over. Pt using 2 cups at sink for rinse/spit to avoid bending. Pt managing proper hygiene without assist. Pt reported having a reacher to assist with lower body needs. Pt performing mobility in hallway and stair management with no physical assist only using rail use. Pt does not require continued OT skilled services. OT D/C at this time.     Recommendations for follow up therapy are one component of a multi-disciplinary discharge planning process, led by the attending physician.  Recommendations may be updated based on patient status, additional functional criteria and insurance authorization.   Follow Up Recommendations  No OT follow up     Assistance Recommended at Discharge Set up Supervision/Assistance  Patient can return home with the following      Functional Status Assessment  Patient has had a recent decline in their functional status and demonstrates the ability to make significant improvements in function in a reasonable and predictable amount of time.  Equipment Recommendations  None recommended by OT    Recommendations for Other Services       Precautions / Restrictions Precautions Precautions: Cervical Precaution Booklet Issued: Yes (comment) Precaution Comments: able to verbalize 4/4 precautions Required Braces or Orthoses: Cervical Brace Cervical Brace: Soft collar;At all times Restrictions Weight Bearing Restrictions: No      Mobility Bed Mobility Overal bed mobility: Independent Bed Mobility:  Rolling, Sidelying to Sit, Sit to Sidelying Rolling: Modified independent (Device/Increase time) Sidelying to sit: Modified independent (Device/Increase time)     Sit to sidelying: Modified independent (Device/Increase time) General bed mobility comments: with bed rail; plans to sleep in recliner    Transfers Overall transfer level: Modified independent Equipment used: Rolling walker (2 wheels)                      Balance Overall balance assessment: No apparent balance deficits (not formally assessed)                                         ADL either performed or assessed with clinical judgement   ADL Overall ADL's : At baseline                                       General ADL Comments: Pt performing LB dressing with figure 4 technique (bringing ankle over knees) to educe bending over. Pt using 2 cups at sink for rinse/spit to avoid bending. Pt managing proper hygiene without assist. Pt reported having a reacher to assist with lower body needs.     Vision Baseline Vision/History: 1 Wears glasses Ability to See in Adequate Light: 0 Adequate Patient Visual Report: No change from baseline Vision Assessment?: No apparent visual deficits     Perception     Praxis      Pertinent Vitals/Pain Pain Assessment Pain Assessment: 0-10 Pain Score: 4  Pain Location:  neck Pain Descriptors / Indicators: Sore Pain Intervention(s): Monitored during session, Repositioned     Hand Dominance Right   Extremity/Trunk Assessment Upper Extremity Assessment Upper Extremity Assessment: Overall WFL for tasks assessed   Lower Extremity Assessment Lower Extremity Assessment: Generalized weakness   Cervical / Trunk Assessment Cervical / Trunk Assessment: Neck Surgery   Communication Communication Communication: No difficulties   Cognition Arousal/Alertness: Awake/alert Behavior During Therapy: WFL for tasks assessed/performed Overall  Cognitive Status: Within Functional Limits for tasks assessed                                       General Comments  VSS on RA. Mild cramping on her L calf at this time. OTR encouraging ankle punps.    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) Type of Home: House Home Access: Stairs to enter [employer] of Steps: 4 Entrance Stairs-Rails: Can reach both Home Layout: One level     Bathroom Shower/Tub: Occupational psychologist: Standard Bathroom Accessibility: No   Home Equipment: Conservation officer, nature (2 wheels);Cane - single point;Shower seat;Grab bars - tub/shower;Adaptive equipment;Hand held shower head Adaptive Equipment: Reacher;Long-handled sponge        Prior Functioning/Environment Prior Level of Function : Independent/Modified Independent             Mobility Comments: use of RW and SPC ADLs Comments: I        OT Problem List: Decreased activity tolerance      OT Treatment/Interventions:      OT Goals(Current goals can be found in the care plan section) Acute Rehab OT Goals Patient Stated Goal: to go home OT Goal Formulation: All assessment and education complete, DC therapy Potential to Achieve Goals: Good  OT Frequency:      Co-evaluation              AM-PAC OT "6 Clicks" Daily Activity     Outcome Measure Help from another person eating meals?: None Help from another person taking care of personal grooming?: None Help from another person toileting, which includes using toliet, bedpan, or urinal?: None Help from another person bathing (including washing, rinsing, drying)?: A Little Help from another person to put on and taking off regular upper body clothing?: None Help  from another person to put on and taking off regular lower body clothing?: None 6 Click Score: 23   End of Session Equipment Utilized During Treatment: Rolling walker (2 wheels) Nurse Communication: Mobility status  Activity Tolerance: Patient tolerated treatment well Patient left: in bed;with call bell/phone within reach  OT Visit Diagnosis: Unsteadiness on feet (R26.81)                Time: [id number] OT Time Calculation (min): 35 min Charges:  OT General Charges $OT Visit: 1 Visit OT Evaluation $OT Eval Moderate Complexity: 1 Mod OT Treatments $Therapeutic Activity: 8-22 mins  Jefferey Pica, OTR/L Acute Rehabilitation Services Office: [phone]   Bharath Bernstein C 10/07/2022, 8:44 AM

## 2022-10-09 ENCOUNTER — Encounter (HOSPITAL_COMMUNITY): Payer: Self-pay | Admitting: Orthopaedic Surgery

## 2022-10-17 ENCOUNTER — Encounter: Payer: Self-pay | Admitting: Orthopaedic Surgery

## 2022-10-17 ENCOUNTER — Ambulatory Visit: Payer: Self-pay

## 2022-10-17 ENCOUNTER — Telehealth: Payer: Self-pay

## 2022-10-17 ENCOUNTER — Ambulatory Visit (INDEPENDENT_AMBULATORY_CARE_PROVIDER_SITE_OTHER): Payer: 59 | Admitting: Orthopaedic Surgery

## 2022-10-17 VITALS — BP 129/87 | HR 91 | Ht 61.0 in | Wt 191.0 lb

## 2022-10-17 DIAGNOSIS — Z981 Arthrodesis status: Secondary | ICD-10-CM

## 2022-10-17 NOTE — Telephone Encounter (Signed)
I called and advised.

## 2022-10-17 NOTE — Progress Notes (Deleted)
Office Visit Note  Patient: Kimberly Jenkins             Date of Birth: 1966/03/26           MRN: [id number]             PCP: Martin Majestic, FNP Referring: Martin Majestic, * Visit Date: 10/31/2022 Occupation: @GUAROCC$ @  Subjective:  No chief complaint on file.   History of Present Illness: Kimberly Jenkins is a 57 y.o. female ***     Activities of Daily Living:  Patient reports morning stiffness for *** {minute/hour:19697}.   Patient {ACTIONS;DENIES/REPORTS:21021675::"Denies"} nocturnal pain.  Difficulty dressing/grooming: {ACTIONS;DENIES/REPORTS:21021675::"Denies"} Difficulty climbing stairs: {ACTIONS;DENIES/REPORTS:21021675::"Denies"} Difficulty getting out of chair: {ACTIONS;DENIES/REPORTS:21021675::"Denies"} Difficulty using hands for taps, buttons, cutlery, and/or writing: {ACTIONS;DENIES/REPORTS:21021675::"Denies"}  No Rheumatology ROS completed.   PMFS History:  Patient Active Problem List   Diagnosis Date Noted   Protrusion of cervical intervertebral disc 07/19/2022   Herniation of cervical intervertebral disc with radiculopathy 07/19/2022   Other spondylosis with radiculopathy, cervical region 07/04/2022   Cancer of kidney ([place]) 07/11/2021   Dehydration 07/11/2021   Kidney disease 07/11/2021   Hypokalemia 05/11/2020   Costochondritis 05/10/2020   Edema 05/10/2020   Epistaxis 05/10/2020   Fibroma 05/10/2020   Hypercholesterolemia 05/10/2020   Iron deficiency anemia secondary to blood loss (chronic) 05/10/2020   Migraine, unspecified, not intractable, without status migrainosus 05/10/2020   Other chronic diseases of tonsils and adenoids 05/10/2020   Hypertrophy of tonsils 05/10/2020   Asthma 05/07/2020   Fluid overload 05/07/2020   Pain in right knee 08/28/2019   Carpal tunnel syndrome of right wrist 04/17/2019   Ulnar tunnel syndrome of right wrist 04/17/2019   Lesion of ulnar nerve, right upper limb 11/12/2018   Current mild  episode of major depressive disorder without prior episode ([place]) 12/12/2017   Abnormal weight loss 11/15/2017   Fibromyalgia 08/27/2016   History of right rotator cuff tear repair 08/27/2016   Diabetes 08/27/2016   Chronic obstructive pulmonary disease ([place]) 08/27/2016   Dyslipidemia 08/27/2016   History of renal cell carcinoma 08/27/2016   Anxiety 08/27/2016   Sleep apnea 08/27/2016   Rheumatoid arthritis of multiple sites with negative rheumatoid factor  07/18/2016   High risk medication use 07/18/2016   Other specified personal risk factors, not elsewhere classified 07/18/2016   Hand pain 07/13/2016   Foot pain 07/13/2016   Elevated sed rate 07/13/2016   Open wound of toe 05/03/2016   Ataxic gait 05/02/2016   Risk for falls 05/02/2016   Tremor 05/02/2016   Vitamin D deficiency 07/08/2015   Chest pain, unspecified 06/23/2015   Mood disorder ([place]) 06/17/2015   Essential hypertension 05/18/2015   Generalized anxiety disorder 05/12/2015   Acquired hypothyroidism 05/05/2015   Severe persistent asthma 04/30/2015   Other allergic rhinitis 04/30/2015   GERD (gastroesophageal reflux disease) 04/30/2015   Chronic kidney disease, stage 2 (mild) 03/02/2015   Obesity 03/01/2015   Chronic post-traumatic headache 02/17/2010   Depression, unspecified 02/17/2010   History of malignant neoplasm of kidney 02/17/2010   Hyperlipidemia associated with type 2 diabetes mellitus ([place]) 02/17/2010   Other amnesia 02/17/2010   Polyosteoarthritis, 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 Overview:  Normal pharmacologic MPS Formatting of this note might be different from the original. Normal pharmacologic MPS Formatting of this note might be different from the original. Formatting of this note might be different from the original. Normal pharmacologic MPS Formatting of this note might be different from the original. Form   Chronic kidney disease (CKD), stage II (mild)    Chronic obstructive pulmonary disease ([place]) 08/27/2016   Chronic post-traumatic headache 02/17/2010   Current mild episode of major depressive disorder without prior episode ([place]) 12/12/2017   Dehydration    Depression, unspecified 02/17/2010   Diabetes 08/27/2016   Dyslipidemia 08/27/2016   Edema 05/10/2020   Elevated sed rate 07/13/2016   48 on 05/2016   Epistaxis 05/10/2020   Essential hypertension 05/18/2015   Fatty liver    Fibroma 05/10/2020   Fibromyalgia 08/27/2016   First degree AV block    Fluid overload 05/07/2020   Foot pain 07/13/2016   Generalized anxiety disorder 05/12/2015   GERD (gastroesophageal reflux disease)    Hand pain 07/13/2016   High risk medication use 07/18/2016   History of hiatal hernia    History of malignant neoplasm of kidney 02/17/2010   Formatting of this note might be different from the original. Formatting of this note might be different from the original. Right partial nephrectomy 2008 Formatting of this note might be different from the original. Overview:  Right partial nephrectomy 2008   History of renal cell carcinoma 08/27/2016   Right partial nephrectomy 2008   Hypercholesterolemia 05/10/2020   Formatting of this note might be different from the original. Formatting of this note might be different from the original. 12/07/2014 - TC 151/Trig 199/HDL 47/LDL 64  Lp(a) 10 ( <30)   Hyperlipidemia associated with type 2 diabetes mellitus ([place]) 02/17/2010   Formatting of this note  might be different from the original. Overview:  12/07/2014 - TC 151/Trig 199/HDL 47/LDL 64  Lp(a) 10 ( <30)  Overview:  12/07/2014 - TC 151/Trig 199/HDL 47/LDL 64  Lp(a) 10 ( <30) Formatting of this note might be different from the original. 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   Kidney disease    Migraine, unspecified, not intractable, without status migrainosus 05/10/2020   Mood disorder ([place]) 06/17/2015   Obesity 03/01/2015   Open wound of toe 05/03/2016   Osteoarthritis    Other allergic rhinitis 04/30/2015   Other amnesia 02/17/2010   Other chronic diseases of tonsils and adenoids 05/10/2020   Other specified personal risk factors, not elsewhere classified 07/18/2016   Pain in right knee 08/28/2019   Rheumatoid arthritis ([place])    Risk for falls 05/02/2016   Severe persistent asthma 04/30/2015   Sleep apnea 08/27/2016   On CPAP, 2 L oxygen   Sleep disturbance 02/17/2010   Spinal stenosis in cervical region 02/17/2010   Tremor    Vitamin D deficiency 07/08/2015    Family History  Problem Relation Age of Onset   Asthma Mother    Lung cancer Mother    Asthma Father    Allergic rhinitis Father  COPD Father    Heart attack Father    Arthritis Father    Brain cancer Paternal Uncle    Asthma Maternal Grandmother    Brain cancer Paternal Grandmother    Breast cancer Cousin    Breast cancer Cousin    Breast cancer Cousin    Brain cancer Brother    Heart attack Brother    Past Surgical History:  Procedure Laterality Date   ANTERIOR CERVICAL DECOMP/DISCECTOMY FUSION N/A 10/06/2022   Procedure: C3-4, C4-5, C5-6 ANTERIOR CERVICAL DISCECTOMY FUSION, ALLOGRAFT, PLATE;  Surgeon: Marybelle Killings, MD;  Location: [facility];  Service: Orthopedics;  Laterality: N/A;  Needs RNFA   APPENDECTOMY  1983   CESAREAN SECTION     2  times, [place]  2009   COLONOSCOPY W/ POLYPECTOMY  2021   ELBOW SURGERY Right    ulner nerve release   ESOPHAGEAL DILATION  2021   KIDNEY SURGERY Right 2008   1/2 right kidney removed   pellet insertion  03/2021   per patient   SHOULDER SURGERY Right 2006   TONSILLECTOMY  2011   TOTAL SHOULDER ARTHROPLASTY Right    TUBAL LIGATION  1986   WRIST SURGERY Right    ulner nerve release   Social History   Social History Narrative   Not on file   Immunization History  Administered Date(s) Administered   PFIZER(Purple Top)SARS-COV-2 Vaccination 11/18/2019, 12/09/2019, 08/05/2020     Objective: Vital Signs: There were no vitals taken for this visit.   Physical Exam   Musculoskeletal Exam: ***  CDAI Exam: CDAI Score: -- Patient Global: --; Provider Global: -- Swollen: --; Tender: -- Joint Exam 10/31/2022   No joint exam has been documented for this visit   There is currently no information documented on the homunculus. Go to the Rheumatology activity and complete the homunculus joint exam.  Investigation: No additional findings.  Imaging: DG Cervical Spine 2 or 3 views  Result Date: 10/06/2022 CLINICAL DATA:  Elective surgery, C3-C6 anterior cervical discectomy and fusion EXAM: CERVICAL SPINE - 2-3 VIEW COMPARISON:  07/04/2022 Fluoroscopy time: 0 minutes 20.4 seconds Dose: 3.82 mGy Images: 3 FINDINGS: Anterior plate and screws identified at C3-C6. Bone plugs present at C3-C4 and C4-C5, with C5-C6 obscured by shoulders. No fracture or subluxation. IMPRESSION: Anterior fusion C3-C6. Electronically Signed   By: Lavonia Dana M.D.   On: 10/06/2022 12:32   DG C-Arm 1-60 Min-No Report  Result Date: 10/06/2022 Fluoroscopy was utilized by the requesting physician.  No radiographic interpretation.   DG C-Arm 1-60 Min-No Report  Result Date: 10/06/2022 Fluoroscopy was utilized by the requesting physician.  No radiographic interpretation.   DG C-Arm 1-60 Min-No  Report  Result Date: 10/06/2022 Fluoroscopy was utilized by the requesting physician.  No radiographic interpretation.   DG C-Arm 1-60 Min-No Report  Result Date: 10/06/2022 Fluoroscopy was utilized by the requesting physician.  No radiographic interpretation.    Recent Labs: Lab Results  Component Value Date   WBC 5.5 10/04/2022   HGB 13.1 10/04/2022   PLT 233 10/04/2022   NA 138 10/04/2022   K 4.0 10/04/2022   CL 104 10/04/2022   CO2 24 10/04/2022   GLUCOSE 141 (H) 10/04/2022   BUN 12 10/04/2022   CREATININE 1.00 10/04/2022   BILITOT 0.3 10/04/2022   ALKPHOS 81 10/04/2022   AST 21 10/04/2022   ALT 13 10/04/2022   PROT 6.4 (L) 10/04/2022   ALBUMIN 3.3 (L) 10/04/2022   CALCIUM  8.6 (L) 10/04/2022   GFRAA 79 11/03/2020   QFTBGOLD NEGATIVE 06/26/2017   QFTBGOLDPLUS NEGATIVE 09/29/2021     Speciality Comments: No specialty comments available.  Procedures:  No procedures performed Allergies: Methotrexate, Advair diskus [fluticasone-salmeterol], Aspirin, Ciprofloxacin, Cephalexin, and Clonidine derivatives   Assessment / Plan:     Visit Diagnoses: No diagnosis found.  Orders: No orders of the defined types were placed in this encounter.  No orders of the defined types were placed in this encounter.   Face-to-face time spent with patient was *** minutes. Greater than 50% of time was spent in counseling and coordination of care.  Follow-Up Instructions: No follow-ups on file.   Bo Merino, MD  Note - This record has been created using Editor, commissioning.  Chart creation errors have been sought, but may not always  have been located. Such creation errors do not reflect on  the standard of medical care.

## 2022-10-17 NOTE — Telephone Encounter (Signed)
Patient asking if she can start her Humira injections back?

## 2022-10-17 NOTE — Progress Notes (Signed)
Post-Op Visit Note   Patient: Kimberly Jenkins           Date of Birth: 1965-10-27           MRN: [id number] Visit Date: 10/17/2022 PCP: Martin Majestic, FNP   Assessment & Plan: Follow-up three-level cervical fusion for myelopathy cord compression and radiculopathy right upper extremity.  Right upper extremity is doing better good sensation the weakness she had is resolved she is walking better.  She had following problems which is resolved.  Incision looks good slight swelling no drainage Steri-Strips changed new collar covers given.  Patient had 1 mm dural tear IntraOp right side C3-4.  No headaches.  Reflexes are symmetrical.  She denies dysphagia no dysphonia.  Chief Complaint:  Chief Complaint  Patient presents with   Neck - Routine Post Op    10/06/2022 C3-4, C4-5 ACDF   Visit Diagnoses:  1. Status post cervical spinal fusion     Plan: Return 5 weeks lateral flexion-extension C-spine x-ray on return.  She is happy with with the resolution of her preop symptoms.  Follow-Up Instructions: No follow-ups on file.   Orders:  Orders Placed This Encounter  Procedures   XR Cervical Spine 2 or 3 views   No orders of the defined types were placed in this encounter.   Imaging: No results found.  PMFS History: Patient Active Problem List   Diagnosis Date Noted   Protrusion of cervical intervertebral disc 07/19/2022   Herniation of cervical intervertebral disc with radiculopathy 07/19/2022   Other spondylosis with radiculopathy, cervical region 07/04/2022   Cancer of kidney ([place]) 07/11/2021   Dehydration 07/11/2021   Kidney disease 07/11/2021   Hypokalemia 05/11/2020   Costochondritis 05/10/2020   Edema 05/10/2020   Epistaxis 05/10/2020   Fibroma 05/10/2020   Hypercholesterolemia 05/10/2020   Iron deficiency anemia secondary to blood loss (chronic) 05/10/2020   Migraine, unspecified, not intractable, without status migrainosus 05/10/2020   Other chronic  diseases of tonsils and adenoids 05/10/2020   Hypertrophy of tonsils 05/10/2020   Asthma 05/07/2020   Fluid overload 05/07/2020   Pain in right knee 08/28/2019   Carpal tunnel syndrome of right wrist 04/17/2019   Ulnar tunnel syndrome of right wrist 04/17/2019   Lesion of ulnar nerve, right upper limb 11/12/2018   Current mild episode of major depressive disorder without prior episode ([place]) 12/12/2017   Abnormal weight loss 11/15/2017   Fibromyalgia 08/27/2016   History of right rotator cuff tear repair 08/27/2016   Diabetes 08/27/2016   Chronic obstructive pulmonary disease ([place]) 08/27/2016   Dyslipidemia 08/27/2016   History of renal cell carcinoma 08/27/2016   Anxiety 08/27/2016   Sleep apnea 08/27/2016   Rheumatoid arthritis of multiple sites with negative rheumatoid factor  07/18/2016   High risk medication use 07/18/2016   Other specified personal risk factors, not elsewhere classified 07/18/2016   Hand pain 07/13/2016   Foot pain 07/13/2016   Elevated sed rate 07/13/2016   Open wound of toe 05/03/2016   Ataxic gait 05/02/2016   Risk for falls 05/02/2016   Tremor 05/02/2016   Vitamin D deficiency 07/08/2015   Chest pain, unspecified 06/23/2015   Mood disorder ([place]) 06/17/2015   Essential hypertension 05/18/2015   Generalized anxiety disorder 05/12/2015   Acquired hypothyroidism 05/05/2015   Severe persistent asthma 04/30/2015   Other allergic rhinitis 04/30/2015   GERD (gastroesophageal reflux disease) 04/30/2015   Chronic kidney disease, stage 2 (mild) 03/02/2015   Obesity 03/01/2015   Chronic post-traumatic  headache 02/17/2010   Depression, unspecified 02/17/2010   History of malignant neoplasm of kidney 02/17/2010   Hyperlipidemia associated with type 2 diabetes mellitus ([place]) 02/17/2010   Other amnesia 02/17/2010   Polyosteoarthritis, unspecified 02/17/2010   Sleep disturbance 02/17/2010   Spinal stenosis in cervical region 02/17/2010   Past Medical History:   Diagnosis Date   Abnormal weight loss 11/15/2017   Acquired hypothyroidism 05/05/2015   ADHD (attention deficit hyperactivity disorder)    Allergic rhinitis    Anxiety 08/27/2016   With depression   Asthma    Ataxic gait 05/02/2016   Cancer of kidney [facility])    Carpal tunnel syndrome of right wrist 04/17/2019   Chest pain, unspecified 06/23/2015   Formatting of this note might be different from the original. Overview:  Normal pharmacologic MPS Formatting of this note might be different from the original. Normal pharmacologic MPS Formatting of this note might be different from the original. Formatting of this note might be different from the original. Normal pharmacologic MPS Formatting of this note might be different from the original. Form   Chronic kidney disease (CKD), stage II (mild)    Chronic obstructive pulmonary disease ([place]) 08/27/2016   Chronic post-traumatic headache 02/17/2010   Current mild episode of major depressive disorder without prior episode ([place]) 12/12/2017   Dehydration    Depression, unspecified 02/17/2010   Diabetes 08/27/2016   Dyslipidemia 08/27/2016   Edema 05/10/2020   Elevated sed rate 07/13/2016   48 on 05/2016   Epistaxis 05/10/2020   Essential hypertension 05/18/2015   Fatty liver    Fibroma 05/10/2020   Fibromyalgia 08/27/2016   First degree AV block    Fluid overload 05/07/2020   Foot pain 07/13/2016   Generalized anxiety disorder 05/12/2015   GERD (gastroesophageal reflux disease)    Hand pain 07/13/2016   High risk medication use 07/18/2016   History of hiatal hernia    History of malignant neoplasm of kidney 02/17/2010   Formatting of this note might be different from the original. Formatting of this note might be different from the original. Right partial nephrectomy 2008 Formatting of this note might be different from the original. Overview:  Right partial nephrectomy 2008   History of renal cell carcinoma 08/27/2016   Right partial  nephrectomy 2008   Hypercholesterolemia 05/10/2020   Formatting of this note might be different from the original. Formatting of this note might be different from the original. 12/07/2014 - TC 151/Trig 199/HDL 47/LDL 64  Lp(a) 10 ( <30)   Hyperlipidemia associated with type 2 diabetes mellitus ([place]) 02/17/2010   Formatting of this note might be different from the original. Overview:  12/07/2014 - TC 151/Trig 199/HDL 47/LDL 64  Lp(a) 10 ( <30)  Overview:  12/07/2014 - TC 151/Trig 199/HDL 47/LDL 64  Lp(a) 10 ( <30) Formatting of this note might be different from the original. 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   Kidney disease    Migraine, unspecified, not intractable, without status migrainosus 05/10/2020   Mood disorder ([place]) 06/17/2015   Obesity 03/01/2015   Open wound of toe 05/03/2016   Osteoarthritis    Other allergic rhinitis 04/30/2015   Other amnesia 02/17/2010   Other chronic diseases of tonsils and adenoids 05/10/2020   Other specified personal risk factors, not elsewhere classified 07/18/2016  Pain in right knee 08/28/2019   Rheumatoid arthritis ([place])    Risk for falls 05/02/2016   Severe persistent asthma 04/30/2015   Sleep apnea 08/27/2016   On CPAP, 2 L oxygen   Sleep disturbance 02/17/2010   Spinal stenosis in cervical region 02/17/2010   Tremor    Vitamin D deficiency 07/08/2015    Family History  Problem Relation Age of Onset   Asthma Mother    Lung cancer Mother    Asthma Father    Allergic rhinitis Father    COPD Father    Heart attack Father    Arthritis Father    Brain cancer Paternal Uncle    Asthma Maternal Grandmother    Brain cancer Paternal Grandmother    Breast cancer Cousin    Breast cancer Cousin    Breast cancer Cousin    Brain cancer Brother    Heart attack Brother      Past Surgical History:  Procedure Laterality Date   ANTERIOR CERVICAL DECOMP/DISCECTOMY FUSION N/A 10/06/2022   Procedure: C3-4, C4-5, C5-6 ANTERIOR CERVICAL DISCECTOMY FUSION, ALLOGRAFT, PLATE;  Surgeon: Marybelle Killings, MD;  Location: [facility];  Service: Orthopedics;  Laterality: N/A;  Needs RNFA   APPENDECTOMY  1983   CESAREAN SECTION     2 times, [place]  2009   COLONOSCOPY W/ POLYPECTOMY  2021   ELBOW SURGERY Right    ulner nerve release   ESOPHAGEAL DILATION  2021   KIDNEY SURGERY Right 2008   1/2 right kidney removed   pellet insertion  03/2021   per patient   SHOULDER SURGERY Right 2006   TONSILLECTOMY  2011   TOTAL SHOULDER ARTHROPLASTY Right    TUBAL LIGATION  1986   WRIST SURGERY Right    ulner nerve release   Social History   Occupational History   Not on file  Tobacco Use   Smoking status: Never    Passive exposure: Past   Smokeless tobacco: Never  Vaping Use   Vaping Use: Never used  Substance and Sexual Activity   Alcohol use: No   Drug use: No   Sexual activity: Not on file

## 2022-10-19 ENCOUNTER — Ambulatory Visit (INDEPENDENT_AMBULATORY_CARE_PROVIDER_SITE_OTHER): Payer: 59 | Admitting: Allergy and Immunology

## 2022-10-19 ENCOUNTER — Encounter: Payer: Self-pay | Admitting: Allergy and Immunology

## 2022-10-19 VITALS — BP 114/74 | HR 84 | Resp 12

## 2022-10-19 DIAGNOSIS — K219 Gastro-esophageal reflux disease without esophagitis: Secondary | ICD-10-CM | POA: Diagnosis not present

## 2022-10-19 DIAGNOSIS — J3089 Other allergic rhinitis: Secondary | ICD-10-CM | POA: Diagnosis not present

## 2022-10-19 DIAGNOSIS — D849 Immunodeficiency, unspecified: Secondary | ICD-10-CM

## 2022-10-19 DIAGNOSIS — B370 Candidal stomatitis: Secondary | ICD-10-CM

## 2022-10-19 DIAGNOSIS — J4550 Severe persistent asthma, uncomplicated: Secondary | ICD-10-CM

## 2022-10-19 NOTE — Progress Notes (Signed)
[place] [facility] [place] [facility] [place]   Follow-up Note  Referring Provider: Darrol Jump, NP Primary Provider: Martin Majestic, FNP Date of Office Visit: 10/19/2022  Subjective:   Kimberly Jenkins (DOB: [birth year]) is a 56 y.o. female who returns to the [facility] on 10/19/2022 in re-evaluation of the following:  HPI: [name] returns to this clinic in evaluation of asthma, allergic rhinitis, LPR, recurrent thrush, and immunosuppression for rheumatoid arthritis.  I last saw her in this clinic 17 April 2022.  She informs me that she had to go to the urgent care center for an episode of "sinusitis and bronchitis" January 2024 and received a steroid and antibiotic.  That was her only respiratory flareup since I have seen her in this clinic.  She has really done well other than that singular event.  She continues to use an anti-IL-5 biologic agent as well as other anti-inflammatory agents for her airway and she has had very little problems involving either her upper airway or lower airway.  She thinks that her reflux is under very good control on her current plan.  On occasion she uses some nystatin for her oral cavity.  She had neck surgery 05 October 2022 and has had a good result regarding her right upper extremity radiculopathy and lower extremity walking issue.  She continues on immunosuppression for her rheumatoid arthritis.  Allergies as of 10/19/2022       Reactions   Methotrexate Diarrhea   Advair Diskus [fluticasone-salmeterol] Itching, Nausea And Vomiting   Aspirin Nausea And Vomiting   Ciprofloxacin Nausea And Vomiting   Cephalexin Nausea And Vomiting   Clonidine Derivatives Rash   Patches blistered skin        Medication List    ALIGN PO Take 1 tablet by mouth daily.   amphetamine-dextroamphetamine 15 MG tablet Commonly known as: ADDERALL Take 15 mg by mouth 2 (two) times daily.   Breo Ellipta 200-25 MCG/ACT  Aepb Generic drug: fluticasone furoate-vilanterol Inhale one puff once daily   busPIRone 15 MG tablet Commonly known as: BUSPAR Take 15 mg by mouth 3 (three) times daily.   clorazepate 3.75 MG tablet Commonly known as: TRANXENE Take 3.75 mg by mouth in the morning, at noon, and at bedtime.   cyclobenzaprine 10 MG tablet Commonly known as: FLEXERIL Take 10 mg by mouth at bedtime.   EPINEPHrine 0.3 mg/0.3 mL Soaj injection Commonly known as: EpiPen 2-Pak Use as directed for life-threatening allergic reaction.   famotidine 20 MG tablet Commonly known as: PEPCID Take 20 mg by mouth at bedtime.   FLUoxetine 40 MG capsule Commonly known as: PROZAC Take 80 mg by mouth daily.   fluticasone 50 MCG/ACT nasal spray Commonly known as: FLONASE Place 2 sprays into both nostrils daily as needed for allergies.   furosemide 40 MG tablet Commonly known as: LASIX Take 20 mg by mouth every other day.   gabapentin 600 MG tablet Commonly known as: NEURONTIN Take 600 mg by mouth 3 (three) times daily.   Humira (2 Pen) 40 MG/0.8ML Pnkt Generic drug: Adalimumab INJECT 40 MG INTO THE SKIN EVERY 14 DAYS   icosapent Ethyl 1 g capsule Commonly known as: VASCEPA Take 1 g by mouth 2 (two) times daily.   Ipratropium-Albuterol 20-100 MCG/ACT Aers respimat Commonly known as: COMBIVENT Inhale 1 puff into the lungs every 6 (six) hours as needed for wheezing.   ipratropium-albuterol 0.5-2.5 (3) MG/3ML Soln Commonly known as: DUONEB CAN USE ONE  VIAL IN NEBULIZER EVERY 4-6 HOURS AS NEEDED FOR COUGH OR WHEEZE   Lantus SoloStar 100 UNIT/ML Solostar Pen Generic drug: insulin glargine Inject 45 Units into the skin at bedtime. Hold if blood sugar is <130   leflunomide 10 MG tablet Commonly known as: ARAVA Take 2 tablets (20 mg total) by mouth daily.   levothyroxine 50 MCG tablet Commonly known as: SYNTHROID Take 50 mcg by mouth daily before breakfast.   Linzess 72 MCG capsule Generic drug:  linaclotide Take 72 mcg by mouth daily as needed (constipation).   mesalamine 1.2 g EC tablet Commonly known as: LIALDA Take 2.4 g by mouth daily.   montelukast 10 MG tablet Commonly known as: SINGULAIR Take 10 mg by mouth at bedtime.   naloxone 4 MG/0.1ML Liqd nasal spray kit Commonly known as: NARCAN Place 1 spray into the nose as directed.   Nucala 100 MG injection Generic drug: mepolizumab INJECT '100MG'$  SUBCUTANEOUSLY EVERY 4 WEEKS   nystatin 100000 UNIT/ML suspension Commonly known as: MYCOSTATIN Swish and spit with 5 mls after Breo/Spiriva use.   olopatadine 0.1 % ophthalmic solution Commonly known as: PATANOL CAN USE ONE DROP IN EACH EYE TWICE DAILY IF NEEDED FOR RED, ITCHY, WATERY EYES.   omeprazole 40 MG capsule Commonly known as: PRILOSEC Take 40 mg by mouth 2 (two) times daily.   ondansetron 8 MG tablet Commonly known as: ZOFRAN Take 8 mg by mouth every 8 (eight) hours as needed for nausea or vomiting.   Oxycodone HCl 10 MG Tabs Take 10 mg by mouth every 8 (eight) hours.   Ozempic (2 MG/DOSE) 8 MG/3ML Sopn Generic drug: Semaglutide (2 MG/DOSE) Inject 2 mg into the skin every Monday.   potassium chloride SA 20 MEQ tablet Commonly known as: KLOR-CON M Take 20 mEq by mouth 2 (two) times daily.   pramipexole 0.125 MG tablet Commonly known as: MIRAPEX Take 0.125 mg by mouth at bedtime.   pravastatin 40 MG tablet Commonly known as: PRAVACHOL Take 40 mg by mouth daily.   primidone 250 MG tablet Commonly known as: MYSOLINE Take 250 mg by mouth at bedtime.   promethazine-dextromethorphan 6.25-15 MG/5ML syrup Commonly known as: PROMETHAZINE-DM Take 5 mLs by mouth 4 (four) times daily as needed for cough.   Rexulti 2 MG Tabs tablet Generic drug: brexpiprazole Take 2 mg by mouth daily.   Spiriva Respimat 1.25 MCG/ACT Aers Generic drug: Tiotropium Bromide Monohydrate Inhale two puffs once daily What changed:  how much to take how to take this when  to take this additional instructions   topiramate 50 MG tablet Commonly known as: TOPAMAX Take 50 mg by mouth 2 (two) times daily.   triamcinolone cream 0.1 % Commonly known as: KENALOG Apply 1 application  topically daily as needed (cracks at the corners of her mouth).   Vitamin D (Ergocalciferol) 1.25 MG (50000 UNIT) Caps capsule Commonly known as: DRISDOL Take 50,000 Units by mouth every Sunday.    Past Medical History:  Diagnosis Date   Abnormal weight loss 11/15/2017   Acquired hypothyroidism 05/05/2015   ADHD (attention deficit hyperactivity disorder)    Allergic rhinitis    Anxiety 08/27/2016   With depression   Asthma    Ataxic gait 05/02/2016   Cancer of kidney [facility])    Carpal tunnel syndrome of right wrist 04/17/2019   Chest pain, unspecified 06/23/2015   Formatting of this note might be different from the original. Overview:  Normal pharmacologic MPS Formatting of this note might be different from  the original. Normal pharmacologic MPS Formatting of this note might be different from the original. Formatting of this note might be different from the original. Normal pharmacologic MPS Formatting of this note might be different from the original. Form   Chronic kidney disease (CKD), stage II (mild)    Chronic obstructive pulmonary disease ([place]) 08/27/2016   Chronic post-traumatic headache 02/17/2010   Current mild episode of major depressive disorder without prior episode ([place]) 12/12/2017   Dehydration    Depression, unspecified 02/17/2010   Diabetes 08/27/2016   Dyslipidemia 08/27/2016   Edema 05/10/2020   Elevated sed rate 07/13/2016   48 on 05/2016   Epistaxis 05/10/2020   Essential hypertension 05/18/2015   Fatty liver    Fibroma 05/10/2020   Fibromyalgia 08/27/2016   First degree AV block    Fluid overload 05/07/2020   Foot pain 07/13/2016   Generalized anxiety disorder 05/12/2015   GERD (gastroesophageal reflux disease)    Hand pain 07/13/2016   High  risk medication use 07/18/2016   History of hiatal hernia    History of malignant neoplasm of kidney 02/17/2010   Formatting of this note might be different from the original. Formatting of this note might be different from the original. Right partial nephrectomy 2008 Formatting of this note might be different from the original. Overview:  Right partial nephrectomy 2008   History of renal cell carcinoma 08/27/2016   Right partial nephrectomy 2008   Hypercholesterolemia 05/10/2020   Formatting of this note might be different from the original. Formatting of this note might be different from the original. 12/07/2014 - TC 151/Trig 199/HDL 47/LDL 64  Lp(a) 10 ( <30)   Hyperlipidemia associated with type 2 diabetes mellitus ([place]) 02/17/2010   Formatting of this note might be different from the original. Overview:  12/07/2014 - TC 151/Trig 199/HDL 47/LDL 64  Lp(a) 10 ( <30)  Overview:  12/07/2014 - TC 151/Trig 199/HDL 47/LDL 64  Lp(a) 10 ( <30) Formatting of this note might be different from the original. 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   Kidney disease    Migraine, unspecified, not intractable, without status migrainosus 05/10/2020   Mood disorder ([place]) 06/17/2015   Obesity 03/01/2015   Open wound of toe 05/03/2016   Osteoarthritis    Other allergic rhinitis 04/30/2015   Other amnesia 02/17/2010   Other chronic diseases of tonsils and adenoids 05/10/2020   Other specified personal risk factors, not elsewhere classified 07/18/2016   Pain in right knee 08/28/2019   Rheumatoid arthritis ([place])    Risk for falls 05/02/2016   Severe persistent asthma 04/30/2015   Sleep apnea 08/27/2016   On CPAP, 2 L oxygen   Sleep disturbance 02/17/2010   Spinal stenosis in cervical region 02/17/2010   Tremor    Vitamin D deficiency  07/08/2015    Past Surgical History:  Procedure Laterality Date   ANTERIOR CERVICAL DECOMP/DISCECTOMY FUSION N/A 10/06/2022   Procedure: C3-4, C4-5, C5-6 ANTERIOR CERVICAL DISCECTOMY FUSION, ALLOGRAFT, PLATE;  Surgeon: Marybelle Killings, MD;  Location: [facility];  Service: Orthopedics;  Laterality: N/A;  Needs RNFA   APPENDECTOMY  1983   CESAREAN SECTION     2 times, [place]  2009   COLONOSCOPY W/ POLYPECTOMY  2021   ELBOW SURGERY Right    ulner  nerve release   ESOPHAGEAL DILATION  2021   KIDNEY SURGERY Right 2008   1/2 right kidney removed   pellet insertion  03/2021   per patient   SHOULDER SURGERY Right 2006   TONSILLECTOMY  2011   TOTAL SHOULDER ARTHROPLASTY Right    TUBAL LIGATION  1986   WRIST SURGERY Right    ulner nerve release    Review of systems negative except as noted in HPI / PMHx or noted below:  Review of Systems  Constitutional: Negative.   HENT: Negative.    Eyes: Negative.   Respiratory: Negative.    Cardiovascular: Negative.   Gastrointestinal: Negative.   Genitourinary: Negative.   Musculoskeletal: Negative.   Skin: Negative.   Neurological: Negative.   Endo/Heme/Allergies: Negative.   Psychiatric/Behavioral: Negative.       Objective:   Vitals:   10/19/22 0824  BP: 114/74  Pulse: 84  Resp: 12  SpO2: 96%          Physical Exam Constitutional:      Appearance: She is not diaphoretic.     Comments: Soft neck brace  HENT:     Head: Normocephalic.     Right Ear: Tympanic membrane, ear canal and external ear normal.     Left Ear: Tympanic membrane, ear canal and external ear normal.     Nose: Nose normal. No mucosal edema or rhinorrhea.     Mouth/Throat:     Pharynx: Uvula midline. No oropharyngeal exudate.  Eyes:     Conjunctiva/sclera: Conjunctivae normal.  Neck:     Thyroid: No thyromegaly.     Trachea: Trachea normal. No tracheal tenderness or tracheal deviation.  Cardiovascular:     Rate and Rhythm: Normal rate  and regular rhythm.     Heart sounds: Normal heart sounds, S1 normal and S2 normal. No murmur heard. Pulmonary:     Effort: No respiratory distress.     Breath sounds: Normal breath sounds. No stridor. No wheezing or rales.  Lymphadenopathy:     Head:     Right side of head: No tonsillar adenopathy.     Left side of head: No tonsillar adenopathy.     Cervical: No cervical adenopathy.  Skin:    Findings: No erythema or rash.     Nails: There is no clubbing.  Neurological:     Mental Status: She is alert.     Diagnostics:    Spirometry was not performed secondary to previous neck surgery.  Assessment and Plan:   1. Asthma, severe persistent, well-controlled   2. Other allergic rhinitis   3. LPRD (laryngopharyngeal reflux disease)   4. Thrush   5. Immunosuppression ([place])    1. Continue Breo 200 - 1 inhalation +  Spiriva 1.25 Respimat - 2 inhalations daily   2. Continue Mepolizumab injections every 4 weeks   3. Continue Montelukast 10 mg - 1 tablet one time per day  4. Continue Flonase - 1-2 sprays each nostril 1 time per day  5. Continue Omeprazole 40 mg - 1 tablet two times per day + Pepcid 20 mg in evening  6. If needed:  A. Nystatin oral solution - 5 mls swish and spit after BREO / Spiriva use B. Duoneb / Albuterol / Combivent Respimat 2 puffs every 4-6 hours C. Zyrtec   7. Return to clinic in 6 months or earlier if problem  [name] appears to be doing very well regarding her respiratory tract and were not going to change any therapy at  this point in time and she will continue on an anti-IL-5 biologic agent as well as a collection of anti-inflammatory agents for her upper and lower airway.  As well, her reflux appears to be under very good control and she will continue on the plan noted above.  Assuming she does well I will see her back in this clinic in 6 months or earlier if there is a problem.  Allena Katz, MD Allergy / [facility]ology [place]

## 2022-10-19 NOTE — Patient Instructions (Addendum)
  1. Continue Breo 200 - 1 inhalation +  Spiriva 1.25 Respimat - 2 inhalations daily   2. Continue Mepolizumab injections every 4 weeks   3. Continue Montelukast 10 mg - 1 tablet one time per day  4. Continue Flonase - 1-2 sprays each nostril 1 time per day  5. Continue Omeprazole 40 mg - 1 tablet two times per day + Pepcid 20 mg in evening  6. If needed:  A. Nystatin oral solution - 5 mls swish and spit after BREO / Spiriva use B. Duoneb / Albuterol / Combivent Respimat 2 puffs every 4-6 hours C. Zyrtec   7. Return to clinic in 6 months or earlier if problem

## 2022-10-20 ENCOUNTER — Other Ambulatory Visit (HOSPITAL_COMMUNITY): Payer: Self-pay

## 2022-10-20 ENCOUNTER — Other Ambulatory Visit: Payer: Self-pay | Admitting: Physician Assistant

## 2022-10-20 NOTE — Telephone Encounter (Signed)
Next Visit: 10/31/2022  Last Visit: 07/25/2022  Last Fill: 07/12/2022  DX: Rheumatoid arthritis of multiple sites with negative rheumatoid factor   Current Dose per office note 07/25/2022: Humira 40 mg subcutaneous injections every 14 days   Labs: 10/04/2022 Glucose 141, Calcium 8.6, Total Protein 6.4, Albumin 3.3  TB Gold: 09/29/2021 Neg  Patient to update TB Gold at upcoming appointment on 10/31/2022   Okay to refill Humira?

## 2022-10-23 ENCOUNTER — Other Ambulatory Visit: Payer: Self-pay | Admitting: *Deleted

## 2022-10-23 ENCOUNTER — Other Ambulatory Visit (HOSPITAL_COMMUNITY): Payer: Self-pay

## 2022-10-23 ENCOUNTER — Other Ambulatory Visit: Payer: Self-pay

## 2022-10-23 ENCOUNTER — Encounter: Payer: Self-pay | Admitting: Allergy and Immunology

## 2022-10-23 MED ORDER — NUCALA 100 MG ~~LOC~~ SOLR: SUBCUTANEOUS | 11 refills | Status: DC

## 2022-10-23 MED ORDER — HUMIRA (2 PEN) 40 MG/0.8ML ~~LOC~~ AJKT
AUTO-INJECTOR | SUBCUTANEOUS | 0 refills | Status: DC
  Filled 2022-10-23: qty 2, 28d supply, fill #0

## 2022-10-24 ENCOUNTER — Other Ambulatory Visit (HOSPITAL_COMMUNITY): Payer: Self-pay

## 2022-10-31 ENCOUNTER — Ambulatory Visit: Payer: Medicare Other | Admitting: Rheumatology

## 2022-10-31 DIAGNOSIS — E785 Hyperlipidemia, unspecified: Secondary | ICD-10-CM

## 2022-10-31 DIAGNOSIS — Z79899 Other long term (current) drug therapy: Secondary | ICD-10-CM

## 2022-10-31 DIAGNOSIS — Z85528 Personal history of other malignant neoplasm of kidney: Secondary | ICD-10-CM

## 2022-10-31 DIAGNOSIS — Z9889 Other specified postprocedural states: Secondary | ICD-10-CM

## 2022-10-31 DIAGNOSIS — M5030 Other cervical disc degeneration, unspecified cervical region: Secondary | ICD-10-CM

## 2022-10-31 DIAGNOSIS — R296 Repeated falls: Secondary | ICD-10-CM

## 2022-10-31 DIAGNOSIS — E559 Vitamin D deficiency, unspecified: Secondary | ICD-10-CM

## 2022-10-31 DIAGNOSIS — J449 Chronic obstructive pulmonary disease, unspecified: Secondary | ICD-10-CM

## 2022-10-31 DIAGNOSIS — M0609 Rheumatoid arthritis without rheumatoid factor, multiple sites: Secondary | ICD-10-CM

## 2022-10-31 DIAGNOSIS — Z8639 Personal history of other endocrine, nutritional and metabolic disease: Secondary | ICD-10-CM

## 2022-10-31 DIAGNOSIS — M8589 Other specified disorders of bone density and structure, multiple sites: Secondary | ICD-10-CM

## 2022-10-31 DIAGNOSIS — F5101 Primary insomnia: Secondary | ICD-10-CM

## 2022-10-31 DIAGNOSIS — M7062 Trochanteric bursitis, left hip: Secondary | ICD-10-CM

## 2022-10-31 DIAGNOSIS — Z8719 Personal history of other diseases of the digestive system: Secondary | ICD-10-CM

## 2022-11-02 ENCOUNTER — Ambulatory Visit: Payer: 59

## 2022-11-06 ENCOUNTER — Ambulatory Visit (INDEPENDENT_AMBULATORY_CARE_PROVIDER_SITE_OTHER): Payer: 59 | Admitting: *Deleted

## 2022-11-06 DIAGNOSIS — J4550 Severe persistent asthma, uncomplicated: Secondary | ICD-10-CM

## 2022-11-06 MED ORDER — MEPOLIZUMAB 100 MG ~~LOC~~ SOLR
100.0000 mg | SUBCUTANEOUS | Status: AC
  Administered 2022-11-06 – 2024-08-14 (×20): 100 mg via SUBCUTANEOUS

## 2022-11-14 NOTE — Progress Notes (Deleted)
Office Visit Note  Patient: Kimberly Jenkins             Date of Birth: Aug 21, 1966           MRN: [id number]             PCP: Martin Majestic, FNP Referring: Martin Majestic, * Visit Date: 11/28/2022 Occupation: @GUAROCC @  Subjective:    History of Present Illness: Zuleika Cabazos is a 57 y.o. female with history of seronegative rheumatoid arthritis and osteoarthritis.  Patient is taking Humira 40 mg subcutaneous injections every 14 days (11/02/2016) and Arava 20 mg by mouth daily (12/13/2016).    CBC and CMP updated on 10/04/22. Her next lab work will be due in May and every 3 months.  TB gold negative on 09/30/21.  Discussed the importance of holding humira and arava if she develops signs or symptoms of an infection and to resume once the infection has completely cleared.    Activities of Daily Living:  Patient reports morning stiffness for *** {minute/hour:19697}.   Patient {ACTIONS;DENIES/REPORTS:21021675::"Denies"} nocturnal pain.  Difficulty dressing/grooming: {ACTIONS;DENIES/REPORTS:21021675::"Denies"} Difficulty climbing stairs: {ACTIONS;DENIES/REPORTS:21021675::"Denies"} Difficulty getting out of chair: {ACTIONS;DENIES/REPORTS:21021675::"Denies"} Difficulty using hands for taps, buttons, cutlery, and/or writing: {ACTIONS;DENIES/REPORTS:21021675::"Denies"}  No Rheumatology ROS completed.   PMFS History:  Patient Active Problem List   Diagnosis Date Noted   Protrusion of cervical intervertebral disc 07/19/2022   Herniation of cervical intervertebral disc with radiculopathy 07/19/2022   Other spondylosis with radiculopathy, cervical region 07/04/2022   Cancer of kidney ([place]) 07/11/2021   Dehydration 07/11/2021   Kidney disease 07/11/2021   Hypokalemia 05/11/2020   Costochondritis 05/10/2020   Edema 05/10/2020   Epistaxis 05/10/2020   Fibroma 05/10/2020   Hypercholesterolemia 05/10/2020   Iron deficiency anemia secondary to blood loss (chronic)  05/10/2020   Migraine, unspecified, not intractable, without status migrainosus 05/10/2020   Other chronic diseases of tonsils and adenoids 05/10/2020   Hypertrophy of tonsils 05/10/2020   Asthma 05/07/2020   Fluid overload 05/07/2020   Pain in right knee 08/28/2019   Carpal tunnel syndrome of right wrist 04/17/2019   Ulnar tunnel syndrome of right wrist 04/17/2019   Lesion of ulnar nerve, right upper limb 11/12/2018   Current mild episode of major depressive disorder without prior episode ([place]) 12/12/2017   Abnormal weight loss 11/15/2017   Fibromyalgia 08/27/2016   History of right rotator cuff tear repair 08/27/2016   Diabetes 08/27/2016   Chronic obstructive pulmonary disease ([place]) 08/27/2016   Dyslipidemia 08/27/2016   History of renal cell carcinoma 08/27/2016   Anxiety 08/27/2016   Sleep apnea 08/27/2016   Rheumatoid arthritis of multiple sites with negative rheumatoid factor  07/18/2016   High risk medication use 07/18/2016   Other specified personal risk factors, not elsewhere classified 07/18/2016   Hand pain 07/13/2016   Foot pain 07/13/2016   Elevated sed rate 07/13/2016   Open wound of toe 05/03/2016   Ataxic gait 05/02/2016   Risk for falls 05/02/2016   Tremor 05/02/2016   Vitamin D deficiency 07/08/2015   Chest pain, unspecified 06/23/2015   Mood disorder ([place]) 06/17/2015   Essential hypertension 05/18/2015   Generalized anxiety disorder 05/12/2015   Acquired hypothyroidism 05/05/2015   Severe persistent asthma 04/30/2015   Other allergic rhinitis 04/30/2015   GERD (gastroesophageal reflux disease) 04/30/2015   Chronic kidney disease, stage 2 (mild) 03/02/2015   Obesity 03/01/2015   Chronic post-traumatic headache 02/17/2010   Depression, unspecified 02/17/2010   History of malignant neoplasm of kidney 02/17/2010  Hyperlipidemia associated with type 2 diabetes mellitus ([place]) 02/17/2010   Other amnesia 02/17/2010   Polyosteoarthritis, unspecified  02/17/2010   Sleep disturbance 02/17/2010   Spinal stenosis in cervical region 02/17/2010    Past Medical History:  Diagnosis Date   Abnormal weight loss 11/15/2017   Acquired hypothyroidism 05/05/2015   ADHD (attention deficit hyperactivity disorder)    Allergic rhinitis    Anxiety 08/27/2016   With depression   Asthma    Ataxic gait 05/02/2016   Cancer of kidney [facility])    Carpal tunnel syndrome of right wrist 04/17/2019   Chest pain, unspecified 06/23/2015   Formatting of this note might be different from the original. Overview:  Normal pharmacologic MPS Formatting of this note might be different from the original. Normal pharmacologic MPS Formatting of this note might be different from the original. Formatting of this note might be different from the original. Normal pharmacologic MPS Formatting of this note might be different from the original. Form   Chronic kidney disease (CKD), stage II (mild)    Chronic obstructive pulmonary disease ([place]) 08/27/2016   Chronic post-traumatic headache 02/17/2010   Current mild episode of major depressive disorder without prior episode ([place]) 12/12/2017   Dehydration    Depression, unspecified 02/17/2010   Diabetes 08/27/2016   Dyslipidemia 08/27/2016   Edema 05/10/2020   Elevated sed rate 07/13/2016   48 on 05/2016   Epistaxis 05/10/2020   Essential hypertension 05/18/2015   Fatty liver    Fibroma 05/10/2020   Fibromyalgia 08/27/2016   First degree AV block    Fluid overload 05/07/2020   Foot pain 07/13/2016   Generalized anxiety disorder 05/12/2015   GERD (gastroesophageal reflux disease)    Hand pain 07/13/2016   High risk medication use 07/18/2016   History of hiatal hernia    History of malignant neoplasm of kidney 02/17/2010   Formatting of this note might be different from the original. Formatting of this note might be different from the original. Right partial nephrectomy 2008 Formatting of this note might be different from the  original. Overview:  Right partial nephrectomy 2008   History of renal cell carcinoma 08/27/2016   Right partial nephrectomy 2008   Hypercholesterolemia 05/10/2020   Formatting of this note might be different from the original. Formatting of this note might be different from the original. 12/07/2014 - TC 151/Trig 199/HDL 47/LDL 64  Lp(a) 10 ( <30)   Hyperlipidemia associated with type 2 diabetes mellitus ([place]) 02/17/2010   Formatting of this note might be different from the original. Overview:  12/07/2014 - TC 151/Trig 199/HDL 47/LDL 64  Lp(a) 10 ( <30)  Overview:  12/07/2014 - TC 151/Trig 199/HDL 47/LDL 64  Lp(a) 10 ( <30) Formatting of this note might be different from the original. 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   Kidney disease    Migraine, unspecified, not intractable, without status migrainosus 05/10/2020   Mood disorder ([place]) 06/17/2015   Obesity 03/01/2015   Open wound of toe 05/03/2016   Osteoarthritis    Other allergic rhinitis 04/30/2015   Other amnesia 02/17/2010   Other chronic diseases of tonsils and adenoids 05/10/2020   Other specified personal risk factors, not elsewhere classified 07/18/2016   Pain in right knee 08/28/2019   Rheumatoid arthritis ([place])    Risk for falls 05/02/2016  Severe persistent asthma 04/30/2015   Sleep apnea 08/27/2016   On CPAP, 2 L oxygen   Sleep disturbance 02/17/2010   Spinal stenosis in cervical region 02/17/2010   Tremor    Vitamin D deficiency 07/08/2015    Family History  Problem Relation Age of Onset   Asthma Mother    Lung cancer Mother    Asthma Father    Allergic rhinitis Father    COPD Father    Heart attack Father    Arthritis Father    Brain cancer Paternal Uncle    Asthma Maternal Grandmother    Brain cancer Paternal Grandmother    Breast cancer  Cousin    Breast cancer Cousin    Breast cancer Cousin    Brain cancer Brother    Heart attack Brother    Past Surgical History:  Procedure Laterality Date   ANTERIOR CERVICAL DECOMP/DISCECTOMY FUSION N/A 10/06/2022   Procedure: C3-4, C4-5, C5-6 ANTERIOR CERVICAL DISCECTOMY FUSION, ALLOGRAFT, PLATE;  Surgeon: Marybelle Killings, MD;  Location: [facility];  Service: Orthopedics;  Laterality: N/A;  Needs RNFA   APPENDECTOMY  1983   CESAREAN SECTION     2 times, [place]  2009   COLONOSCOPY W/ POLYPECTOMY  2021   ELBOW SURGERY Right    ulner nerve release   ESOPHAGEAL DILATION  2021   KIDNEY SURGERY Right 2008   1/2 right kidney removed   pellet insertion  03/2021   per patient   SHOULDER SURGERY Right 2006   TONSILLECTOMY  2011   TOTAL SHOULDER ARTHROPLASTY Right    TUBAL LIGATION  1986   WRIST SURGERY Right    ulner nerve release   Social History   Social History Narrative   Not on file   Immunization History  Administered Date(s) Administered   PFIZER(Purple Top)SARS-COV-2 Vaccination 11/18/2019, 12/09/2019, 08/05/2020     Objective: Vital Signs: There were no vitals taken for this visit.   Physical Exam Vitals and nursing note reviewed.  Constitutional:      Appearance: She is well-developed.  HENT:     Head: Normocephalic and atraumatic.  Eyes:     Conjunctiva/sclera: Conjunctivae normal.  Cardiovascular:     Rate and Rhythm: Normal rate and regular rhythm.     Heart sounds: Normal heart sounds.  Pulmonary:     Effort: Pulmonary effort is normal.     Breath sounds: Normal breath sounds.  Abdominal:     General: Bowel sounds are normal.     Palpations: Abdomen is soft.  Musculoskeletal:     Cervical back: Normal range of motion.  Lymphadenopathy:     Cervical: No cervical adenopathy.  Skin:    General: Skin is warm and dry.     Capillary Refill: Capillary refill takes less than 2 seconds.  Neurological:     Mental Status: She is alert and  oriented to person, place, and time.  Psychiatric:        Behavior: Behavior normal.      Musculoskeletal Exam: ***  CDAI Exam: CDAI Score: -- Patient Global: --; Provider Global: -- Swollen: --; Tender: -- Joint Exam 11/28/2022   No joint exam has been documented for this visit   There is currently no information documented on the homunculus. Go to the Rheumatology activity and complete the homunculus joint exam.  Investigation: No additional findings.  Imaging: No results found.  Recent Labs: Lab Results  Component Value Date   WBC 5.5 10/04/2022  HGB 13.1 10/04/2022   PLT 233 10/04/2022   NA 138 10/04/2022   K 4.0 10/04/2022   CL 104 10/04/2022   CO2 24 10/04/2022   GLUCOSE 141 (H) 10/04/2022   BUN 12 10/04/2022   CREATININE 1.00 10/04/2022   BILITOT 0.3 10/04/2022   ALKPHOS 81 10/04/2022   AST 21 10/04/2022   ALT 13 10/04/2022   PROT 6.4 (L) 10/04/2022   ALBUMIN 3.3 (L) 10/04/2022   CALCIUM 8.6 (L) 10/04/2022   GFRAA 79 11/03/2020   QFTBGOLD NEGATIVE 06/26/2017   QFTBGOLDPLUS NEGATIVE 09/29/2021    Speciality Comments: No specialty comments available.  Procedures:  No procedures performed Allergies: Methotrexate, Advair diskus [fluticasone-salmeterol], Aspirin, Ciprofloxacin, Cephalexin, and Clonidine derivatives   Assessment / Plan:     Visit Diagnoses: Rheumatoid arthritis of multiple sites with negative rheumatoid factor   High risk medication use  Fibromyalgia  History of rotator cuff surgery  Trochanteric bursitis, left hip  Primary insomnia  Positive ANA (antinuclear antibody)  Osteopenia of multiple sites  History of gastroesophageal reflux (GERD)  Dyslipidemia  History of renal cell carcinoma  History of COPD  History of diabetes mellitus  History of hypothyroidism  Elevated hemoglobin A1c  Vitamin D deficiency  Orders: No orders of the defined types were placed in this encounter.  No orders of the defined types  were placed in this encounter.   Face-to-face time spent with patient was *** minutes. Greater than 50% of time was spent in counseling and coordination of care.  Follow-Up Instructions: No follow-ups on file.   Ofilia Neas, PA-C  Note - This record has been created using Dragon software.  Chart creation errors have been sought, but may not always  have been located. Such creation errors do not reflect on  the standard of medical care.

## 2022-11-17 ENCOUNTER — Other Ambulatory Visit: Payer: Self-pay

## 2022-11-17 ENCOUNTER — Other Ambulatory Visit: Payer: Self-pay | Admitting: Physician Assistant

## 2022-11-17 ENCOUNTER — Other Ambulatory Visit (HOSPITAL_COMMUNITY): Payer: Self-pay

## 2022-11-17 MED ORDER — HUMIRA (2 PEN) 40 MG/0.8ML ~~LOC~~ AJKT
AUTO-INJECTOR | SUBCUTANEOUS | 0 refills | Status: DC
  Filled 2022-11-17: qty 2, fill #0
  Filled 2022-11-24: qty 2, 28d supply, fill #0

## 2022-11-17 NOTE — Telephone Encounter (Signed)
Next Visit: 11/28/2022   Last Visit: 07/25/2022   Last Fill: 07/12/2022   DX: Rheumatoid arthritis of multiple sites with negative rheumatoid factor    Current Dose per office note 07/25/2022: Humira 40 mg subcutaneous injections every 14 days    Labs: 10/04/2022 Glucose 141, Calcium 8.6, Total Protein 6.4, Albumin 3.3   TB Gold: 09/29/2021 Neg   Patient to update TB Gold at upcoming appointment on 11/28/2022    Okay to refill Humira?

## 2022-11-21 ENCOUNTER — Ambulatory Visit (INDEPENDENT_AMBULATORY_CARE_PROVIDER_SITE_OTHER): Payer: 59 | Admitting: Orthopaedic Surgery

## 2022-11-21 ENCOUNTER — Encounter: Payer: Self-pay | Admitting: Orthopaedic Surgery

## 2022-11-21 ENCOUNTER — Other Ambulatory Visit (INDEPENDENT_AMBULATORY_CARE_PROVIDER_SITE_OTHER): Payer: 59

## 2022-11-21 VITALS — BP 119/79 | Ht 61.0 in | Wt 191.0 lb

## 2022-11-21 DIAGNOSIS — Z981 Arthrodesis status: Secondary | ICD-10-CM | POA: Diagnosis not present

## 2022-11-21 NOTE — Progress Notes (Signed)
Post-Op Visit Note   Patient: Kimberly Jenkins           Date of Birth: September 11, 1965           MRN: [id number] Visit Date: 11/21/2022 PCP: Martin Majestic, FNP   Assessment & Plan: Follow-up 3 level cervical fusion.  Flexion-extension x-rays show no motion.  Incisions well-healed.  Good relief of preop symptoms.  States her arms and hands feel normal.  She can return in 4 months for single view  lateral cervical x-ray.  Chief Complaint:  Chief Complaint  Patient presents with   Neck - Routine Post Op, Follow-up    10/06/2022 C3-4, C4-5 ACDF   Visit Diagnoses:  1. Status post cervical spinal fusion     Plan: Return 4 months with x-rays as above.  Follow-Up Instructions: Return in about 4 months (around 03/23/2023).   Orders:  Orders Placed This Encounter  Procedures   XR Cervical Spine 2 or 3 views   No orders of the defined types were placed in this encounter.   Imaging: No results found.  PMFS History: Patient Active Problem List   Diagnosis Date Noted   Protrusion of cervical intervertebral disc 07/19/2022   Herniation of cervical intervertebral disc with radiculopathy 07/19/2022   Other spondylosis with radiculopathy, cervical region 07/04/2022   Cancer of kidney ([place]) 07/11/2021   Dehydration 07/11/2021   Kidney disease 07/11/2021   Hypokalemia 05/11/2020   Costochondritis 05/10/2020   Edema 05/10/2020   Epistaxis 05/10/2020   Fibroma 05/10/2020   Hypercholesterolemia 05/10/2020   Iron deficiency anemia secondary to blood loss (chronic) 05/10/2020   Migraine, unspecified, not intractable, without status migrainosus 05/10/2020   Other chronic diseases of tonsils and adenoids 05/10/2020   Hypertrophy of tonsils 05/10/2020   Asthma 05/07/2020   Fluid overload 05/07/2020   Pain in right knee 08/28/2019   Carpal tunnel syndrome of right wrist 04/17/2019   Ulnar tunnel syndrome of right wrist 04/17/2019   Lesion of ulnar nerve, right upper limb  11/12/2018   Current mild episode of major depressive disorder without prior episode ([place]) 12/12/2017   Abnormal weight loss 11/15/2017   Fibromyalgia 08/27/2016   History of right rotator cuff tear repair 08/27/2016   Diabetes 08/27/2016   Chronic obstructive pulmonary disease ([place]) 08/27/2016   Dyslipidemia 08/27/2016   History of renal cell carcinoma 08/27/2016   Anxiety 08/27/2016   Sleep apnea 08/27/2016   Rheumatoid arthritis of multiple sites with negative rheumatoid factor  07/18/2016   High risk medication use 07/18/2016   Other specified personal risk factors, not elsewhere classified 07/18/2016   Hand pain 07/13/2016   Foot pain 07/13/2016   Elevated sed rate 07/13/2016   Open wound of toe 05/03/2016   Ataxic gait 05/02/2016   Risk for falls 05/02/2016   Tremor 05/02/2016   Vitamin D deficiency 07/08/2015   Chest pain, unspecified 06/23/2015   Mood disorder ([place]) 06/17/2015   Essential hypertension 05/18/2015   Generalized anxiety disorder 05/12/2015   Acquired hypothyroidism 05/05/2015   Severe persistent asthma 04/30/2015   Other allergic rhinitis 04/30/2015   GERD (gastroesophageal reflux disease) 04/30/2015   Chronic kidney disease, stage 2 (mild) 03/02/2015   Obesity 03/01/2015   Chronic post-traumatic headache 02/17/2010   Depression, unspecified 02/17/2010   History of malignant neoplasm of kidney 02/17/2010   Hyperlipidemia associated with type 2 diabetes mellitus ([place]) 02/17/2010   Other amnesia 02/17/2010   Polyosteoarthritis, unspecified 02/17/2010   Sleep disturbance 02/17/2010   Spinal stenosis  in cervical region 02/17/2010   Past Medical History:  Diagnosis Date   Abnormal weight loss 11/15/2017   Acquired hypothyroidism 05/05/2015   ADHD (attention deficit hyperactivity disorder)    Allergic rhinitis    Anxiety 08/27/2016   With depression   Asthma    Ataxic gait 05/02/2016   Cancer of kidney [facility])    Carpal tunnel syndrome of right wrist  04/17/2019   Chest pain, unspecified 06/23/2015   Formatting of this note might be different from the original. Overview:  Normal pharmacologic MPS Formatting of this note might be different from the original. Normal pharmacologic MPS Formatting of this note might be different from the original. Formatting of this note might be different from the original. Normal pharmacologic MPS Formatting of this note might be different from the original. Form   Chronic kidney disease (CKD), stage II (mild)    Chronic obstructive pulmonary disease ([place]) 08/27/2016   Chronic post-traumatic headache 02/17/2010   Current mild episode of major depressive disorder without prior episode ([place]) 12/12/2017   Dehydration    Depression, unspecified 02/17/2010   Diabetes 08/27/2016   Dyslipidemia 08/27/2016   Edema 05/10/2020   Elevated sed rate 07/13/2016   48 on 05/2016   Epistaxis 05/10/2020   Essential hypertension 05/18/2015   Fatty liver    Fibroma 05/10/2020   Fibromyalgia 08/27/2016   First degree AV block    Fluid overload 05/07/2020   Foot pain 07/13/2016   Generalized anxiety disorder 05/12/2015   GERD (gastroesophageal reflux disease)    Hand pain 07/13/2016   High risk medication use 07/18/2016   History of hiatal hernia    History of malignant neoplasm of kidney 02/17/2010   Formatting of this note might be different from the original. Formatting of this note might be different from the original. Right partial nephrectomy 2008 Formatting of this note might be different from the original. Overview:  Right partial nephrectomy 2008   History of renal cell carcinoma 08/27/2016   Right partial nephrectomy 2008   Hypercholesterolemia 05/10/2020   Formatting of this note might be different from the original. Formatting of this note might be different from the original. 12/07/2014 - TC 151/Trig 199/HDL 47/LDL 64  Lp(a) 10 ( <30)   Hyperlipidemia associated with type 2 diabetes mellitus ([place]) 02/17/2010    Formatting of this note might be different from the original. Overview:  12/07/2014 - TC 151/Trig 199/HDL 47/LDL 64  Lp(a) 10 ( <30)  Overview:  12/07/2014 - TC 151/Trig 199/HDL 47/LDL 64  Lp(a) 10 ( <30) Formatting of this note might be different from the original. 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   Kidney disease    Migraine, unspecified, not intractable, without status migrainosus 05/10/2020   Mood disorder ([place]) 06/17/2015   Obesity 03/01/2015   Open wound of toe 05/03/2016   Osteoarthritis    Other allergic rhinitis 04/30/2015   Other amnesia 02/17/2010   Other chronic diseases of tonsils and adenoids 05/10/2020   Other specified personal risk factors, not elsewhere classified 07/18/2016   Pain in right knee 08/28/2019   Rheumatoid arthritis ([place])    Risk for falls 05/02/2016   Severe persistent asthma 04/30/2015   Sleep apnea 08/27/2016   On CPAP, 2 L oxygen   Sleep disturbance 02/17/2010   Spinal stenosis in cervical  region 02/17/2010   Tremor    Vitamin D deficiency 07/08/2015    Family History  Problem Relation Age of Onset   Asthma Mother    Lung cancer Mother    Asthma Father    Allergic rhinitis Father    COPD Father    Heart attack Father    Arthritis Father    Brain cancer Paternal Uncle    Asthma Maternal Grandmother    Brain cancer Paternal Grandmother    Breast cancer Cousin    Breast cancer Cousin    Breast cancer Cousin    Brain cancer Brother    Heart attack Brother     Past Surgical History:  Procedure Laterality Date   ANTERIOR CERVICAL DECOMP/DISCECTOMY FUSION N/A 10/06/2022   Procedure: C3-4, C4-5, C5-6 ANTERIOR CERVICAL DISCECTOMY FUSION, ALLOGRAFT, PLATE;  Surgeon: Marybelle Killings, MD;  Location: [facility];  Service: Orthopedics;  Laterality: N/A;  Needs RNFA   APPENDECTOMY   1983   CESAREAN SECTION     2 times, [place]  2009   COLONOSCOPY W/ POLYPECTOMY  2021   ELBOW SURGERY Right    ulner nerve release   ESOPHAGEAL DILATION  2021   KIDNEY SURGERY Right 2008   1/2 right kidney removed   pellet insertion  03/2021   per patient   SHOULDER SURGERY Right 2006   TONSILLECTOMY  2011   TOTAL SHOULDER ARTHROPLASTY Right    TUBAL LIGATION  1986   WRIST SURGERY Right    ulner nerve release   Social History   Occupational History   Not on file  Tobacco Use   Smoking status: Never    Passive exposure: Past   Smokeless tobacco: Never  Vaping Use   Vaping Use: Never used  Substance and Sexual Activity   Alcohol use: No   Drug use: No   Sexual activity: Not on file

## 2022-11-24 ENCOUNTER — Other Ambulatory Visit: Payer: Self-pay

## 2022-11-24 ENCOUNTER — Other Ambulatory Visit (HOSPITAL_COMMUNITY): Payer: Self-pay

## 2022-11-28 ENCOUNTER — Ambulatory Visit: Payer: Self-pay | Admitting: Physician Assistant

## 2022-11-28 DIAGNOSIS — Z85528 Personal history of other malignant neoplasm of kidney: Secondary | ICD-10-CM

## 2022-11-28 DIAGNOSIS — Z9889 Other specified postprocedural states: Secondary | ICD-10-CM

## 2022-11-28 DIAGNOSIS — Z8639 Personal history of other endocrine, nutritional and metabolic disease: Secondary | ICD-10-CM

## 2022-11-28 DIAGNOSIS — M7062 Trochanteric bursitis, left hip: Secondary | ICD-10-CM

## 2022-11-28 DIAGNOSIS — R768 Other specified abnormal immunological findings in serum: Secondary | ICD-10-CM

## 2022-11-28 DIAGNOSIS — R7309 Other abnormal glucose: Secondary | ICD-10-CM

## 2022-11-28 DIAGNOSIS — F5101 Primary insomnia: Secondary | ICD-10-CM

## 2022-11-28 DIAGNOSIS — Z8709 Personal history of other diseases of the respiratory system: Secondary | ICD-10-CM

## 2022-11-28 DIAGNOSIS — M797 Fibromyalgia: Secondary | ICD-10-CM

## 2022-11-28 DIAGNOSIS — E785 Hyperlipidemia, unspecified: Secondary | ICD-10-CM

## 2022-11-28 DIAGNOSIS — E559 Vitamin D deficiency, unspecified: Secondary | ICD-10-CM

## 2022-11-28 DIAGNOSIS — M8589 Other specified disorders of bone density and structure, multiple sites: Secondary | ICD-10-CM

## 2022-11-28 DIAGNOSIS — M0609 Rheumatoid arthritis without rheumatoid factor, multiple sites: Secondary | ICD-10-CM

## 2022-11-28 DIAGNOSIS — Z8719 Personal history of other diseases of the digestive system: Secondary | ICD-10-CM

## 2022-11-28 DIAGNOSIS — Z79899 Other long term (current) drug therapy: Secondary | ICD-10-CM

## 2022-11-29 ENCOUNTER — Other Ambulatory Visit: Payer: Self-pay

## 2022-12-04 ENCOUNTER — Ambulatory Visit (INDEPENDENT_AMBULATORY_CARE_PROVIDER_SITE_OTHER): Payer: 59 | Admitting: *Deleted

## 2022-12-04 DIAGNOSIS — J4550 Severe persistent asthma, uncomplicated: Secondary | ICD-10-CM | POA: Diagnosis not present

## 2022-12-07 NOTE — Progress Notes (Unsigned)
Office Visit Note  Patient: Kimberly Jenkins             Date of Birth: 10/04/1965           MRN: [id number]             PCP: Grayland Jack, FNP Referring: Grayland Jack, * Visit Date: 12/21/2022 Occupation: @GUAROCC @  Subjective:  Medication monitoring  History of Present Illness: Anabel Lykins is a 57 y.o. female with history of seronegative rheumatoid arthritis and fibromyalgia.  Patient is taking Humira 40 mg sq injections every 14 days (11/02/2016) and Arava 20 mg by mouth daily (12/13/2016).  She has been tolerating combination therapy without any side effects or injection site reactions from Humira.  She denies any signs or symptoms of a rheumatoid arthritis flare.  She denies any joint swelling at this time.  She states that her fibromyalgia has been more active recently.  She underwent a C-spine fusion performed by Dr. Ophelia Charter on 10/06/2022.  She has been more sedentary than usual recovering from surgery which she feels may have contributed to the flares.  She states that her brother is also currently living with her which has been more stressful.  She is having discomfort in both hips due to trochanteric bursitis as well as some trapezius muscle tension and tenderness. Patient reports that she has been having recurrent UTIs requiring several courses of antibiotics.  She states that the UTIs have been presenting with a foul odor of her urine.  She has not had any dysuria, urgency, or frequency.   Activities of Daily Living:  Patient reports morning stiffness for 30 minutes.   Patient Denies nocturnal pain.  Difficulty dressing/grooming: Denies Difficulty climbing stairs: Denies Difficulty getting out of chair: Reports Difficulty using hands for taps, buttons, cutlery, and/or writing: Reports  Review of Systems  Constitutional:  Positive for fatigue.  HENT:  Positive for mouth dryness. Negative for mouth sores.   Eyes:  Negative for dryness.  Respiratory:   Positive for shortness of breath.   Cardiovascular:  Negative for chest pain and palpitations.  Gastrointestinal:  Negative for blood in stool, constipation and diarrhea.  Endocrine: Negative for increased urination.  Genitourinary:  Negative for involuntary urination.  Musculoskeletal:  Positive for joint pain, joint pain, myalgias, morning stiffness, muscle tenderness and myalgias. Negative for gait problem, joint swelling and muscle weakness.  Skin:  Negative for color change, rash, hair loss and sensitivity to sunlight.  Allergic/Immunologic: Positive for susceptible to infections.  Neurological:  Negative for dizziness and headaches.  Hematological:  Negative for swollen glands.  Psychiatric/Behavioral:  Positive for depressed mood and sleep disturbance. The patient is nervous/anxious.     PMFS History:  Patient Active Problem List   Diagnosis Date Noted   Protrusion of cervical intervertebral disc 07/19/2022   Herniation of cervical intervertebral disc with radiculopathy 07/19/2022   Other spondylosis with radiculopathy, cervical region 07/04/2022   Cancer of kidney 07/11/2021   Dehydration 07/11/2021   Kidney disease 07/11/2021   Hypokalemia 05/11/2020   Costochondritis 05/10/2020   Edema 05/10/2020   Epistaxis 05/10/2020   Fibroma 05/10/2020   Hypercholesterolemia 05/10/2020   Iron deficiency anemia secondary to blood loss (chronic) 05/10/2020   Migraine, unspecified, not intractable, without status migrainosus 05/10/2020   Other chronic diseases of tonsils and adenoids 05/10/2020   Hypertrophy of tonsils 05/10/2020   Asthma 05/07/2020   Fluid overload 05/07/2020   Pain in right knee 08/28/2019   Carpal tunnel syndrome  of right wrist 04/17/2019   Ulnar tunnel syndrome of right wrist 04/17/2019   Lesion of ulnar nerve, right upper limb 11/12/2018   Current mild episode of major depressive disorder without prior episode 12/12/2017   Abnormal weight loss 11/15/2017    Fibromyalgia 08/27/2016   History of right rotator cuff tear repair 08/27/2016   Diabetes 08/27/2016   Chronic obstructive pulmonary disease 08/27/2016   Dyslipidemia 08/27/2016   History of renal cell carcinoma 08/27/2016   Anxiety 08/27/2016   Sleep apnea 08/27/2016   Rheumatoid arthritis of multiple sites with negative rheumatoid factor  07/18/2016   High risk medication use 07/18/2016   Other specified personal risk factors, not elsewhere classified 07/18/2016   Hand pain 07/13/2016   Foot pain 07/13/2016   Elevated sed rate 07/13/2016   Open wound of toe 05/03/2016   Ataxic gait 05/02/2016   Risk for falls 05/02/2016   Tremor 05/02/2016   Vitamin D deficiency 07/08/2015   Chest pain, unspecified 06/23/2015   Mood disorder 06/17/2015   Essential hypertension 05/18/2015   Generalized anxiety disorder 05/12/2015   Acquired hypothyroidism 05/05/2015   Severe persistent asthma 04/30/2015   Other allergic rhinitis 04/30/2015   GERD (gastroesophageal reflux disease) 04/30/2015   Chronic kidney disease, stage 2 (mild) 03/02/2015   Obesity 03/01/2015   Chronic post-traumatic headache 02/17/2010   Depression, unspecified 02/17/2010   History of malignant neoplasm of kidney 02/17/2010   Hyperlipidemia associated with type 2 diabetes mellitus 02/17/2010   Other amnesia 02/17/2010   Polyosteoarthritis, 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 Overview:  Normal pharmacologic MPS Formatting of  this note might be different from the original. Normal pharmacologic MPS Formatting of this note might be different from the original. Formatting of this note might be different from the original. Normal pharmacologic MPS Formatting of this note might be different from the original. Form   Chronic kidney disease (CKD), stage II (mild)    Chronic obstructive pulmonary disease 08/27/2016   Chronic post-traumatic headache 02/17/2010   Current mild episode of major depressive disorder without prior episode 12/12/2017   Dehydration    Depression, unspecified 02/17/2010   Diabetes 08/27/2016   Dyslipidemia 08/27/2016   Edema 05/10/2020   Elevated sed rate 07/13/2016   48 on 05/2016   Epistaxis 05/10/2020   Essential hypertension 05/18/2015   Fatty liver    Fibroma 05/10/2020   Fibromyalgia 08/27/2016   First degree AV block    Fluid overload 05/07/2020   Foot pain 07/13/2016   Generalized anxiety disorder 05/12/2015   GERD (gastroesophageal reflux disease)    Hand pain 07/13/2016   High risk medication use 07/18/2016   History of hiatal hernia    History of malignant neoplasm of kidney 02/17/2010   Formatting of this note might be different from the original. Formatting of this note might be different from the original. Right partial nephrectomy 2008 Formatting of this note might be different from the original. Overview:  Right partial  nephrectomy 2008   History of renal cell carcinoma 08/27/2016   Right partial nephrectomy 2008   Hypercholesterolemia 05/10/2020   Formatting of this note might be different from the original. Formatting of this note might be different from the original. 12/07/2014 - TC 151/Trig 199/HDL 47/LDL 64  Lp(a) 10 ( <16)   Hyperlipidemia associated with type 2 diabetes mellitus 02/17/2010   Formatting of this note might be different from the original. Overview:  12/07/2014 - TC 151/Trig 199/HDL 47/LDL 64  Lp(a) 10 ( <10)  Overview:  12/07/2014 - TC 151/Trig 199/HDL  47/LDL 64  Lp(a) 10 ( <30) Formatting of this note might be different from the original. 12/07/2014 - TC 151/Trig 199/HDL 47/LDL 64  Lp(a) 10 ( <96)   Hypertrophy of tonsils 05/10/2020   Hypokalemia 05/11/2020   Hypothyroidism    Iron deficiency anemia secondary to blood loss (chronic) 05/10/2020   Kidney disease    Migraine, unspecified, not intractable, without status migrainosus 05/10/2020   Mood disorder 06/17/2015   Obesity 03/01/2015   Open wound of toe 05/03/2016   Osteoarthritis    Other allergic rhinitis 04/30/2015   Other amnesia 02/17/2010   Other chronic diseases of tonsils and adenoids 05/10/2020   Other specified personal risk factors, not elsewhere classified 07/18/2016   Pain in right knee 08/28/2019   Rheumatoid arthritis    Risk for falls 05/02/2016   Severe persistent asthma 04/30/2015   Sleep apnea 08/27/2016   On CPAP, 2 L oxygen   Sleep disturbance 02/17/2010   Spinal stenosis in cervical region 02/17/2010   Tremor    Vitamin D deficiency 07/08/2015    Family History  Problem Relation Age of Onset   Asthma Mother    Lung cancer Mother    Asthma Father    Allergic rhinitis Father    COPD Father    Heart attack Father    Arthritis Father    Brain cancer Paternal Uncle    Asthma Maternal Grandmother    Brain cancer Paternal Grandmother    Breast cancer Cousin    Breast cancer Cousin    Breast cancer Cousin    Brain cancer Brother    Heart attack Brother    Past Surgical History:  Procedure Laterality Date   ANTERIOR CERVICAL DECOMP/DISCECTOMY FUSION N/A 10/06/2022   Procedure: C3-4, C4-5, C5-6 ANTERIOR CERVICAL DISCECTOMY FUSION, ALLOGRAFT, PLATE;  Surgeon: Eldred Manges, MD;  Location: [facility] OR;  Service: Orthopedics;  Laterality: N/A;  Needs RNFA   APPENDECTOMY  1983   CESAREAN SECTION     2 times, 1986 & 1983   CHOLECYSTECTOMY  2009   COLONOSCOPY W/ POLYPECTOMY  2021   ELBOW SURGERY Right    ulner nerve release   ESOPHAGEAL DILATION  2021    KIDNEY SURGERY Right 2008   1/2 right kidney removed   pellet insertion  03/2021   per patient   SHOULDER SURGERY Right 2006   TONSILLECTOMY  2011   TOTAL SHOULDER ARTHROPLASTY Right    TUBAL LIGATION  1986   WRIST SURGERY Right    ulner nerve release   Social History   Social History Narrative   Not on file   Immunization History  Administered Date(s) Administered   PFIZER(Purple Top)SARS-COV-2 Vaccination 11/18/2019, 12/09/2019, 08/05/2020     Objective: Vital Signs: BP 133/81 (BP Location: Left Arm, Patient Position: Sitting, Cuff Size: Normal)   Pulse 80   Resp 15   Ht 5' 1.5" (1.562 m)   Wt 193 lb  6.4 oz (87.7 kg)   BMI 35.95 kg/m    Physical Exam Vitals and nursing note reviewed.  Constitutional:      Appearance: She is well-developed.  HENT:     Head: Normocephalic and atraumatic.  Eyes:     Conjunctiva/sclera: Conjunctivae normal.  Cardiovascular:     Rate and Rhythm: Normal rate and regular rhythm.     Heart sounds: Normal heart sounds.  Pulmonary:     Effort: Pulmonary effort is normal.     Breath sounds: Normal breath sounds.  Abdominal:     General: Bowel sounds are normal.     Palpations: Abdomen is soft.  Musculoskeletal:     Cervical back: Normal range of motion.  Lymphadenopathy:     Cervical: No cervical adenopathy.  Skin:    General: Skin is warm and dry.     Capillary Refill: Capillary refill takes less than 2 seconds.  Neurological:     Mental Status: She is alert and oriented to person, place, and time.  Psychiatric:        Behavior: Behavior normal.      Musculoskeletal Exam: C-spine has limited range of motion.  Trapezius muscle tension and tenderness bilaterally.  Shoulder joints, elbow joints, wrist joints, MCPs, PIPs, DIPs have good range of motion with no synovitis.  Complete fist formation bilaterally.  Hip joints have good range of motion with no groin pain.  Tenderness over bilateral trochanteric bursa and along the IT band  bilaterally.  Knee joints have good range of motion with no warmth or effusion.  Ankle joints have good range of motion with no tenderness or joint swelling.  CDAI Exam: CDAI Score: -- Patient Global: 2 mm; Provider Global: 2 mm Swollen: --; Tender: -- Joint Exam 12/21/2022   No joint exam has been documented for this visit   There is currently no information documented on the homunculus. Go to the Rheumatology activity and complete the homunculus joint exam.  Investigation: No additional findings.  Imaging: No results found.  Recent Labs: Lab Results  Component Value Date   WBC 5.5 10/04/2022   HGB 13.1 10/04/2022   PLT 233 10/04/2022   NA 138 10/04/2022   K 4.0 10/04/2022   CL 104 10/04/2022   CO2 24 10/04/2022   GLUCOSE 141 (H) 10/04/2022   BUN 12 10/04/2022   CREATININE 1.00 10/04/2022   BILITOT 0.3 10/04/2022   ALKPHOS 81 10/04/2022   AST 21 10/04/2022   ALT 13 10/04/2022   PROT 6.4 (L) 10/04/2022   ALBUMIN 3.3 (L) 10/04/2022   CALCIUM 8.6 (L) 10/04/2022   GFRAA 79 11/03/2020   QFTBGOLD NEGATIVE 06/26/2017   QFTBGOLDPLUS NEGATIVE 09/29/2021    Speciality Comments: No specialty comments available.  Procedures:  No procedures performed Allergies: Methotrexate, Advair diskus [fluticasone-salmeterol], Aspirin, Ciprofloxacin, Cephalexin, and Clonidine derivatives      Assessment / Plan:     Visit Diagnoses: Rheumatoid arthritis of multiple sites with negative rheumatoid factor: She has no synovitis on examination today.  She has not had any signs or symptoms of a rheumatoid arthritis flare.  She has clinically been doing well on Humira 40 mg sq injections every 14 days and Arava 20 mg 1 tablet by mouth daily.  She is tolerating both medications without any side effects or injection site reactions from Humira.  No medication changes will be made at this time.  She was advised to notify us if she develops signs or symptoms of a flare. She will follow up  in 5 months  or sooner if needed.  High risk medication use - - Humira 40 mg sq injections every 14 days (11/02/2016), Arava 20 mg by mouth daily (12/13/2016) (inadequate response to Enbrel, side effects from MTX).  CBC and CMP updated on 10/04/22.  Orders for CBC and CMP were released today. TB gold negative on 09/29/21.  Order for TB gold released today.  Patient reports having recurrent UTIs requiring several courses of antibiotics.  During this time she has not been holding Arava and Humira.  Patient reports that the UTIs have been presenting with abnormal urine odor.  Plan to check UA and urine culture today.  Patient was advised to hold her Humira dose today until the urine culture results have come back.  Discussed the importance of holding humira and arava if she develops signs or symptoms of an infection and to resume once the infection has completely cleared.  - Plan: QuantiFERON-TB Gold Plus, CBC with Differential/Platelet, COMPLETE METABOLIC PANEL WITH GFR  Screening for tuberculosis -Order for TB gold released today.  Plan: QuantiFERON-TB Gold Plus  Abnormal urine odor - Patient has been experiencing recurrent UTIs presenting with abnormal urine odor.  She has been treated with several courses of antibiotics per patient.  During this time she has not been holding Humira and her [place].  Discussed the importance of always holding Humira and Areva during infections and to resume once the infection is completely cleared.  Plan to update UA and urine culture today.  Plan: Urinalysis, Routine w reflex microscopic, Urine Culture  Recurrent UTI -She has been experiencing recurrent UTIs.  She has not been holding Humira and Arava while taking antibiotics.  Patient was strongly encouraged to hold both medications while recovering from infections.  UA and urine culture updated today.  Plan: Urinalysis, Routine w reflex microscopic, Urine Culture  Hx of fusion of cervical spine: Patient underwent C3-C4 C4-C5 C5-C6  anterior cervical discectomy fusion on 10/06/2022 performed by Dr. Ophelia Charter.  She has been recovering well without any complications.  She had a small gap in therapy holding Humira and Arava as prescribed.  She has resumed both medications.  She is scheduled to follow back up with Dr. Ophelia Charter on 03/20/2023.  Fibromyalgia: She has been experiencing increased myofascial pain and more frequent fibromyalgia flares.  She attributes more frequent flares to being under increased stress as well as more sedentary since recovering from her surgery on 10/06/2022.  She is started walking for exercise but has had ongoing discomfort in the trochanteric bursa bilaterally.  Patient was encouraged to perform stretching exercises daily.  Discussed the importance of stress management, regular exercise, and good sleep hygiene.  Trochanteric bursitis of both hips: She has tenderness over bilateral trochanteric bursa.  Good ROM of both hip joints with no groin pain.  Encouraged patient to perform stretching exercises daily.    Primary insomnia: Discussed the importance of good sleep hygiene.  Positive ANA (antinuclear antibody): No clinical features of systemic lupus at this time.  Osteopenia of multiple sites - - DEXA updated on 12/12/21: AP spine BMD 0.883 with T score -1.5.  Due to update DEXA April 2025.  She remains on vitamin D 50,000 units once weekly.  Other medical conditions are listed as follows:   History of gastroesophageal reflux (GERD)  Dyslipidemia  History of renal cell carcinoma  History of COPD  History of diabetes mellitus  History of hypothyroidism  Elevated hemoglobin A1c  Vitamin D deficiency    Orders: Orders  Placed This Encounter  Procedures   Urine Culture   QuantiFERON-TB Gold Plus   CBC with Differential/Platelet   COMPLETE METABOLIC PANEL WITH GFR   Urinalysis, Routine w reflex microscopic   No orders of the defined types were placed in this encounter.     Follow-Up  Instructions: Return in about 3 months (around 03/22/2023).   Gearldine Bienenstock, PA-C  Note - This record has been created using Dragon software.  Chart creation errors have been sought, but may not always  have been located. Such creation errors do not reflect on  the standard of medical care.

## 2022-12-21 ENCOUNTER — Ambulatory Visit: Payer: 59 | Attending: Rheumatology | Admitting: Physician Assistant

## 2022-12-21 ENCOUNTER — Encounter: Payer: Self-pay | Admitting: Physician Assistant

## 2022-12-21 VITALS — BP 133/81 | HR 80 | Resp 15 | Ht 61.5 in | Wt 193.4 lb

## 2022-12-21 DIAGNOSIS — M7062 Trochanteric bursitis, left hip: Secondary | ICD-10-CM

## 2022-12-21 DIAGNOSIS — E559 Vitamin D deficiency, unspecified: Secondary | ICD-10-CM

## 2022-12-21 DIAGNOSIS — M8589 Other specified disorders of bone density and structure, multiple sites: Secondary | ICD-10-CM

## 2022-12-21 DIAGNOSIS — M7061 Trochanteric bursitis, right hip: Secondary | ICD-10-CM

## 2022-12-21 DIAGNOSIS — M797 Fibromyalgia: Secondary | ICD-10-CM

## 2022-12-21 DIAGNOSIS — R768 Other specified abnormal immunological findings in serum: Secondary | ICD-10-CM

## 2022-12-21 DIAGNOSIS — Z79899 Other long term (current) drug therapy: Secondary | ICD-10-CM

## 2022-12-21 DIAGNOSIS — Z111 Encounter for screening for respiratory tuberculosis: Secondary | ICD-10-CM

## 2022-12-21 DIAGNOSIS — Z8639 Personal history of other endocrine, nutritional and metabolic disease: Secondary | ICD-10-CM

## 2022-12-21 DIAGNOSIS — R7309 Other abnormal glucose: Secondary | ICD-10-CM

## 2022-12-21 DIAGNOSIS — M0609 Rheumatoid arthritis without rheumatoid factor, multiple sites: Secondary | ICD-10-CM

## 2022-12-21 DIAGNOSIS — Z85528 Personal history of other malignant neoplasm of kidney: Secondary | ICD-10-CM

## 2022-12-21 DIAGNOSIS — Z981 Arthrodesis status: Secondary | ICD-10-CM

## 2022-12-21 DIAGNOSIS — F5101 Primary insomnia: Secondary | ICD-10-CM

## 2022-12-21 DIAGNOSIS — E785 Hyperlipidemia, unspecified: Secondary | ICD-10-CM

## 2022-12-21 DIAGNOSIS — R8290 Unspecified abnormal findings in urine: Secondary | ICD-10-CM

## 2022-12-21 DIAGNOSIS — Z8709 Personal history of other diseases of the respiratory system: Secondary | ICD-10-CM

## 2022-12-21 DIAGNOSIS — N390 Urinary tract infection, site not specified: Secondary | ICD-10-CM

## 2022-12-21 DIAGNOSIS — Z8719 Personal history of other diseases of the digestive system: Secondary | ICD-10-CM

## 2022-12-21 LAB — URINALYSIS, ROUTINE W REFLEX MICROSCOPIC
Bilirubin Urine: NEGATIVE
Hgb urine dipstick: NEGATIVE

## 2022-12-21 LAB — MICROSCOPIC MESSAGE

## 2022-12-21 NOTE — Progress Notes (Signed)
Patient has trace leukocytes.  No bacteria or nitrites noted. Urine culture pending.

## 2022-12-22 LAB — URINALYSIS, ROUTINE W REFLEX MICROSCOPIC
Ketones, ur: NEGATIVE
pH: 7.5 (ref 5.0–8.0)

## 2022-12-22 NOTE — Progress Notes (Signed)
Glucose is 109.  Rest of CMP WNL.  CBC WNL.

## 2022-12-23 LAB — COMPLETE METABOLIC PANEL WITH GFR
AG Ratio: 1.6 (calc) (ref 1.0–2.5)
ALT: 12 U/L (ref 6–29)
AST: 15 U/L (ref 10–35)
Albumin: 3.9 g/dL (ref 3.6–5.1)
Alkaline phosphatase (APISO): 97 U/L (ref 37–153)
BUN: 13 mg/dL (ref 7–25)
CO2: 24 mmol/L (ref 20–32)
Calcium: 8.7 mg/dL (ref 8.6–10.4)
Chloride: 106 mmol/L (ref 98–110)
Creat: 0.92 mg/dL (ref 0.50–1.03)
Globulin: 2.4 g/dL (calc) (ref 1.9–3.7)
Glucose, Bld: 109 mg/dL — ABNORMAL HIGH (ref 65–99)
Potassium: 4.1 mmol/L (ref 3.5–5.3)
Sodium: 138 mmol/L (ref 135–146)
Total Bilirubin: 0.3 mg/dL (ref 0.2–1.2)
Total Protein: 6.3 g/dL (ref 6.1–8.1)
eGFR: 72 mL/min/{1.73_m2} (ref >=60)

## 2022-12-23 LAB — URINALYSIS, ROUTINE W REFLEX MICROSCOPIC
Bacteria, UA: NONE SEEN /HPF
Glucose, UA: NEGATIVE
Hyaline Cast: NONE SEEN /LPF
Nitrite: NEGATIVE
Protein, ur: NEGATIVE
RBC / HPF: NONE SEEN /HPF (ref 0–2)
Specific Gravity, Urine: 1.006 (ref 1.001–1.035)

## 2022-12-23 LAB — CBC WITH DIFFERENTIAL/PLATELET
Absolute Monocytes: 762 cells/uL (ref 200–950)
Basophils Absolute: 50 cells/uL (ref 0–200)
Basophils Relative: 0.8 %
Eosinophils Absolute: 50 cells/uL (ref 15–500)
Eosinophils Relative: 0.8 %
HCT: 40 % (ref 35.0–45.0)
Hemoglobin: 12.8 g/dL (ref 11.7–15.5)
Lymphs Abs: 2186 cells/uL (ref 850–3900)
MCH: 27.2 pg (ref 27.0–33.0)
MCHC: 32 g/dL (ref 32.0–36.0)
MCV: 84.9 fL (ref 80.0–100.0)
MPV: 9.8 fL (ref 7.5–12.5)
Monocytes Relative: 12.1 %
Neutro Abs: 3251 cells/uL (ref 1500–7800)
Neutrophils Relative %: 51.6 %
Platelets: 258 10*3/uL (ref 140–400)
RBC: 4.71 10*6/uL (ref 3.80–5.10)
RDW: 13.6 % (ref 11.0–15.0)
Total Lymphocyte: 34.7 %
WBC: 6.3 10*3/uL (ref 3.8–10.8)

## 2022-12-23 LAB — QUANTIFERON-TB GOLD PLUS
Mitogen-NIL: >10 IU/mL
NIL: 0.06 IU/mL
QuantiFERON-TB Gold Plus: NEGATIVE
TB1-NIL: 0 IU/mL
TB2-NIL: <0 IU/mL

## 2022-12-23 LAB — URINE CULTURE
MICRO NUMBER:: 14875314
SPECIMEN QUALITY:: ADEQUATE

## 2022-12-25 ENCOUNTER — Other Ambulatory Visit (HOSPITAL_COMMUNITY): Payer: Self-pay

## 2022-12-25 ENCOUNTER — Other Ambulatory Visit: Payer: Self-pay | Admitting: Internal Medicine

## 2022-12-25 ENCOUNTER — Other Ambulatory Visit: Payer: Self-pay

## 2022-12-25 MED ORDER — HUMIRA (2 PEN) 40 MG/0.8ML ~~LOC~~ AJKT
AUTO-INJECTOR | SUBCUTANEOUS | 0 refills | Status: DC
  Filled 2022-12-25: qty 2, 28d supply, fill #0
  Filled 2023-02-09: qty 2, 28d supply, fill #1
  Filled 2023-03-20: qty 2, 28d supply, fill #2

## 2022-12-25 NOTE — Telephone Encounter (Signed)
Last Fill: 11/17/2022  Labs: 12/21/2022 Glucose is 109. Rest of CMP WNL. CBC WNL.  TB Gold: 12/21/2022 negative    Next Visit: 03/22/2023  Last Visit: 12/21/2022  [id number] arthritis of multiple sites with negative rheumatoid factor   Current Dose per office note on 12/21/2022: Humira 40 mg sq injections every 14 days   Okay to refill Humira?

## 2022-12-25 NOTE — Progress Notes (Signed)
Urine culture is positive for Ecoli.  Please notify the patient and forward results to PCP as requested.   Please advise patient to hold humira and arava until she has completed antibiotics.

## 2022-12-27 ENCOUNTER — Other Ambulatory Visit (HOSPITAL_COMMUNITY): Payer: Self-pay

## 2022-12-27 ENCOUNTER — Encounter: Payer: Self-pay | Admitting: *Deleted

## 2022-12-30 ENCOUNTER — Other Ambulatory Visit: Payer: Self-pay | Admitting: Physician Assistant

## 2023-01-01 ENCOUNTER — Ambulatory Visit: Payer: 59

## 2023-01-01 MED ORDER — LEFLUNOMIDE 10 MG PO TABS: 20.0000 mg | ORAL_TABLET | Freq: Every day | ORAL | 0 refills | Status: DC

## 2023-01-01 NOTE — Telephone Encounter (Signed)
Last Fill: 07/25/2022   Labs: 12/21/2022 Glucose is 109. Rest of CMP WNL. CBC WNL.  Next Visit: 03/22/2023  Last Visit: 12/21/2022  DX: Rheumatoid arthritis of multiple sites with negative rheumatoid factor   Current Dose per office note 12/21/2022: Arava 20 mg 1 tablet by mouth daily   Okay to refill Arava ?

## 2023-01-23 ENCOUNTER — Ambulatory Visit (INDEPENDENT_AMBULATORY_CARE_PROVIDER_SITE_OTHER): Payer: 59 | Admitting: *Deleted

## 2023-01-23 DIAGNOSIS — J4550 Severe persistent asthma, uncomplicated: Secondary | ICD-10-CM

## 2023-02-05 ENCOUNTER — Other Ambulatory Visit (HOSPITAL_COMMUNITY): Payer: Self-pay

## 2023-02-09 ENCOUNTER — Other Ambulatory Visit (HOSPITAL_COMMUNITY): Payer: Self-pay

## 2023-02-19 ENCOUNTER — Ambulatory Visit: Payer: 59

## 2023-02-20 ENCOUNTER — Other Ambulatory Visit (HOSPITAL_COMMUNITY): Payer: Self-pay

## 2023-02-21 ENCOUNTER — Ambulatory Visit (INDEPENDENT_AMBULATORY_CARE_PROVIDER_SITE_OTHER): Payer: 59

## 2023-02-21 DIAGNOSIS — J4550 Severe persistent asthma, uncomplicated: Secondary | ICD-10-CM

## 2023-02-26 ENCOUNTER — Other Ambulatory Visit (HOSPITAL_COMMUNITY): Payer: Self-pay

## 2023-03-08 NOTE — Progress Notes (Unsigned)
Office Visit Note  Patient: Kimberly Jenkins             Date of Birth: [birth year]           MRN: [id number]             PCP: Grayland Jack, FNP Referring: Grayland Jack, * Visit Date: 03/22/2023 Occupation: @GUAROCC @  Subjective:  Soreness  History of Present Illness: Breannon Lemery is a 57 y.o. female with history of seronegative rheumatoid arthritis and fibromyalgia.  Patient remains on Humira 40 mg sq injections every 14 days (11/02/2016), Arava 20 mg by mouth daily (12/13/2016).  She is tolerating combination therapy without any side effects and has not missed any doses recently.  Her last dose of Humira was administered yesterday.  She denies any recent or recurrent infections.  She has not had any gaps in therapy recently.  Patient states that her neck is overall doing well since after surgery.  She had a follow-up visit with Dr. Ophelia Charter on 03/20/2023 at which time an x-ray was obtained.  She no longer has to follow-up with Dr. Ophelia Charter.  Patient states he is having some increased soreness in her hands which she attributes to the rainy weather.  She continues to experience intermittent myalgias and muscle tenderness due to fibromyalgia.    Activities of Daily Living:  Patient reports morning stiffness for 15 minutes.   Patient Reports nocturnal pain.  Difficulty dressing/grooming: Denies Difficulty climbing stairs: Reports Difficulty getting out of chair: Reports Difficulty using hands for taps, buttons, cutlery, and/or writing: Reports  Review of Systems  Constitutional:  Positive for fatigue.  HENT:  Positive for mouth dryness. Negative for mouth sores.   Eyes:  Negative for dryness.  Respiratory:  Negative for shortness of breath.   Cardiovascular:  Negative for chest pain and palpitations.  Gastrointestinal:  Negative for blood in stool, constipation and diarrhea.  Endocrine: Negative for increased urination.  Genitourinary:  Negative for involuntary  urination.  Musculoskeletal:  Positive for joint pain, gait problem, joint pain, myalgias, muscle weakness, morning stiffness, muscle tenderness and myalgias. Negative for joint swelling.  Skin:  Negative for color change, rash, hair loss and sensitivity to sunlight.  Allergic/Immunologic: Positive for susceptible to infections.  Neurological:  Positive for headaches. Negative for dizziness.  Hematological:  Negative for swollen glands.  Psychiatric/Behavioral:  Positive for depressed mood and sleep disturbance. The patient is nervous/anxious.     PMFS History:  Patient Active Problem List   Diagnosis Date Noted   Protrusion of cervical intervertebral disc 07/19/2022   Herniation of cervical intervertebral disc with radiculopathy 07/19/2022   Other spondylosis with radiculopathy, cervical region 07/04/2022   Cancer of kidney (HCC) 07/11/2021   Dehydration 07/11/2021   Kidney disease 07/11/2021   Hypokalemia 05/11/2020   Costochondritis 05/10/2020   Edema 05/10/2020   Epistaxis 05/10/2020   Fibroma 05/10/2020   Hypercholesterolemia 05/10/2020   Iron deficiency anemia secondary to blood loss (chronic) 05/10/2020   Migraine, unspecified, not intractable, without status migrainosus 05/10/2020   Other chronic diseases of tonsils and adenoids 05/10/2020   Hypertrophy of tonsils 05/10/2020   Asthma 05/07/2020   Fluid overload 05/07/2020   Pain in right knee 08/28/2019   Carpal tunnel syndrome of right wrist 04/17/2019   Ulnar tunnel syndrome of right wrist 04/17/2019   Lesion of ulnar nerve, right upper limb 11/12/2018   Current mild episode of major depressive disorder without prior episode (HCC) 12/12/2017   Abnormal weight loss  11/15/2017   Fibromyalgia 08/27/2016   History of right rotator cuff tear repair 08/27/2016   Diabetes 08/27/2016   Chronic obstructive pulmonary disease (HCC) 08/27/2016   Dyslipidemia 08/27/2016   History of renal cell carcinoma 08/27/2016   Anxiety  08/27/2016   Sleep apnea 08/27/2016   Rheumatoid arthritis of multiple sites with negative rheumatoid factor  07/18/2016   High risk medication use 07/18/2016   Other specified personal risk factors, not elsewhere classified 07/18/2016   Hand pain 07/13/2016   Foot pain 07/13/2016   Elevated sed rate 07/13/2016   Open wound of toe 05/03/2016   Ataxic gait 05/02/2016   Risk for falls 05/02/2016   Tremor 05/02/2016   Vitamin D deficiency 07/08/2015   Chest pain, unspecified 06/23/2015   Mood disorder (HCC) 06/17/2015   Essential hypertension 05/18/2015   Generalized anxiety disorder 05/12/2015   Acquired hypothyroidism 05/05/2015   Severe persistent asthma 04/30/2015   Other allergic rhinitis 04/30/2015   GERD (gastroesophageal reflux disease) 04/30/2015   Chronic kidney disease, stage 2 (mild) 03/02/2015   Obesity 03/01/2015   Chronic post-traumatic headache 02/17/2010   Depression, unspecified 02/17/2010   History of malignant neoplasm of kidney 02/17/2010   Hyperlipidemia associated with type 2 diabetes mellitus (HCC) 02/17/2010   Other amnesia 02/17/2010   Polyosteoarthritis, 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 Overview:  Normal pharmacologic MPS Formatting of this note might be different from the original. Normal pharmacologic MPS Formatting of this note might be different from the original. Formatting of this note might be different from the original. Normal pharmacologic MPS  Formatting of this note might be different from the original. Form   Chronic kidney disease (CKD), stage II (mild)    Chronic obstructive pulmonary disease (HCC) 08/27/2016   Chronic post-traumatic headache 02/17/2010   Current mild episode of major depressive disorder without prior episode (HCC) 12/12/2017   Dehydration    Depression, unspecified 02/17/2010   Diabetes 08/27/2016   Dyslipidemia 08/27/2016   Edema 05/10/2020   Elevated sed rate 07/13/2016   48 on 05/2016   Epistaxis 05/10/2020   Essential hypertension 05/18/2015   Fatty liver    Fibroma 05/10/2020   Fibromyalgia 08/27/2016   First degree AV block    Fluid overload 05/07/2020   Foot pain 07/13/2016   Generalized anxiety disorder 05/12/2015   GERD (gastroesophageal reflux disease)    Hand pain 07/13/2016   High risk medication use 07/18/2016   History of hiatal hernia    History of malignant neoplasm of kidney 02/17/2010   Formatting of this note might be different from the original. Formatting of this note might be different from the original. Right partial nephrectomy 2008 Formatting of this note might be different from the original. Overview:  Right partial nephrectomy 2008   History of renal cell carcinoma 08/27/2016   Right partial nephrectomy 2008   Hypercholesterolemia 05/10/2020   Formatting of this note might be different from the original. Formatting of this  note might be different from the original. 12/07/2014 - TC 151/Trig 199/HDL 47/LDL 64  Lp(a) 10 ( <13)   Hyperlipidemia associated with type 2 diabetes mellitus (HCC) 02/17/2010   Formatting of this note might be different from the original. Overview:  12/07/2014 - TC 151/Trig 199/HDL 47/LDL 64  Lp(a) 10 ( <08)  Overview:  12/07/2014 - TC 151/Trig 199/HDL 47/LDL 64  Lp(a) 10 ( <65) Formatting of this note might be different from the original. 12/07/2014 - TC 151/Trig 199/HDL 47/LDL 64  Lp(a) 10 ( <78)   Hypertrophy of tonsils 05/10/2020   Hypokalemia  05/11/2020   Hypothyroidism    Iron deficiency anemia secondary to blood loss (chronic) 05/10/2020   Kidney disease    Migraine, unspecified, not intractable, without status migrainosus 05/10/2020   Mood disorder (HCC) 06/17/2015   Obesity 03/01/2015   Open wound of toe 05/03/2016   Osteoarthritis    Other allergic rhinitis 04/30/2015   Other amnesia 02/17/2010   Other chronic diseases of tonsils and adenoids 05/10/2020   Other specified personal risk factors, not elsewhere classified 07/18/2016   Pain in right knee 08/28/2019   Rheumatoid arthritis (HCC)    Risk for falls 05/02/2016   Severe persistent asthma 04/30/2015   Sleep apnea 08/27/2016   On CPAP, 2 L oxygen   Sleep disturbance 02/17/2010   Spinal stenosis in cervical region 02/17/2010   Tremor    Vitamin D deficiency 07/08/2015    Family History  Problem Relation Age of Onset   Asthma Mother    Lung cancer Mother    Asthma Father    Allergic rhinitis Father    COPD Father    Heart attack Father    Arthritis Father    Brain cancer Paternal Uncle    Asthma Maternal Grandmother    Brain cancer Paternal Grandmother    Breast cancer Cousin    Breast cancer Cousin    Breast cancer Cousin    Brain cancer Brother    Heart attack Brother    Past Surgical History:  Procedure Laterality Date   ANTERIOR CERVICAL DECOMP/DISCECTOMY FUSION N/A 10/06/2022   Procedure: C3-4, C4-5, C5-6 ANTERIOR CERVICAL DISCECTOMY FUSION, ALLOGRAFT, PLATE;  Surgeon: Eldred Manges, MD;  Location: [facility] OR;  Service: Orthopedics;  Laterality: N/A;  Needs RNFA   APPENDECTOMY  1983   CESAREAN SECTION     2 times, 1986 & 1983   CHOLECYSTECTOMY  2009   COLONOSCOPY W/ POLYPECTOMY  2021   ELBOW SURGERY Right    ulner nerve release   ESOPHAGEAL DILATION  2021   KIDNEY SURGERY Right 2008   1/2 right kidney removed   pellet insertion  03/2021   per patient   SHOULDER SURGERY Right 2006   TONSILLECTOMY  2011   TOTAL SHOULDER ARTHROPLASTY Right     TUBAL LIGATION  1986   WRIST SURGERY Right    ulner nerve release   Social History   Social History Narrative   Not on file   Immunization History  Administered Date(s) Administered   PFIZER(Purple Top)SARS-COV-2 Vaccination 11/18/2019, 12/09/2019, 08/05/2020     Objective: Vital Signs: BP 113/73 (BP Location: Left Arm, Patient Position: Sitting, Cuff Size: Normal)   Pulse 76   Resp 15   Ht 5' 1.5" (1.562 m)   Wt 199 lb (90.3 kg)   BMI 36.99 kg/m    Physical Exam Vitals and nursing note reviewed.  Constitutional:      Appearance: She is well-developed.  HENT:  Head: Normocephalic and atraumatic.  Eyes:     Conjunctiva/sclera: Conjunctivae normal.  Cardiovascular:     Rate and Rhythm: Normal rate and regular rhythm.     Heart sounds: Normal heart sounds.  Pulmonary:     Effort: Pulmonary effort is normal.     Breath sounds: Normal breath sounds.  Abdominal:     General: Bowel sounds are normal.     Palpations: Abdomen is soft.  Musculoskeletal:     Cervical back: Normal range of motion.  Lymphadenopathy:     Cervical: No cervical adenopathy.  Skin:    General: Skin is warm and dry.     Capillary Refill: Capillary refill takes less than 2 seconds.  Neurological:     Mental Status: She is alert and oriented to person, place, and time.  Psychiatric:        Behavior: Behavior normal.      Musculoskeletal Exam: C-spine has limited range of motion.  Shoulder joints have good range of motion with no discomfort.  Elbow joints, wrist joints, MCPs, PIPs, DIPs have good range of motion with no synovitis.  Tenderness over bilateral second MCP joints.  Hip joints have good range of motion with no groin pain.  Tenderness over bilateral trochanteric bursa.  Knee joints have good range of motion with no warmth or effusion.  Ankle joints have good range of motion with no tenderness or joint swelling.  CDAI Exam: CDAI Score: 4  Patient Global: 10 / 100; Provider Global: 10 /  100 Swollen: 0 ; Tender: 2  Joint Exam 03/22/2023      Right  Left  MCP 2   Tender   Tender     Investigation: No additional findings.  Imaging: No results found.  Recent Labs: Lab Results  Component Value Date   WBC 6.3 12/21/2022   HGB 12.8 12/21/2022   PLT 258 12/21/2022   NA 138 12/21/2022   K 4.1 12/21/2022   CL 106 12/21/2022   CO2 24 12/21/2022   GLUCOSE 109 (H) 12/21/2022   BUN 13 12/21/2022   CREATININE 0.92 12/21/2022   BILITOT 0.3 12/21/2022   ALKPHOS 81 10/04/2022   AST 15 12/21/2022   ALT 12 12/21/2022   PROT 6.3 12/21/2022   ALBUMIN 3.3 (L) 10/04/2022   CALCIUM 8.7 12/21/2022   GFRAA 79 11/03/2020   QFTBGOLD NEGATIVE 06/26/2017   QFTBGOLDPLUS NEGATIVE 12/21/2022    Speciality Comments: No specialty comments available.  Procedures:  No procedures performed Allergies: Methotrexate, Advair diskus [fluticasone-salmeterol], Aspirin, Ciprofloxacin, Cephalexin, and Clonidine derivatives   Assessment / Plan:     Visit Diagnoses: Rheumatoid arthritis of multiple sites with negative rheumatoid factor  - She presents today with some increased tenderness in bilateral second MCP joints but no synovitis was noted.  She was able to make a complete fist bilaterally.  She remains on Humira 40 mg subcu days injections every 14 days and Arava 20 mg 1 tablet by mouth daily.  She is tolerating combination therapy without any side effects or injection site reactions.  She has not had any recent interruptions in therapy.  Her last dose of Humira was administered yesterday.  She has noticed some increased soreness which she attributes to the rainy weather recently.  Overall her rheumatoid arthritis remains well-controlled on the current treatment regimen.  No medication changes will be made at this time.  X-rays of both hands and feet were obtained today to assess for radiographic progression.  She was advised notify us if  she develops more frequent flares.  She will follow-up  in the office in 3 months or sooner if needed.  Plan: XR Hand 2 View Right, XR Hand 2 View Left, XR Foot 2 Views Left, XR Foot 2 Views Right  High risk medication use - Humira 40 mg sq injections every 14 days (11/02/2016), Arava 20 mg by mouth daily (12/13/2016)  (inadequate response to Enbrel, side effects from MTX).  CBC and CMP updated on 12/21/22. Orders for CBC and CMP released today.  Her next lab work will be due in October and every 3 months to monitor for drug toxicity. TB gold negative on 12/21/22.  No recent or recurrent infections.  Discussed the importance of holding humira and arava if she develops signs or symptoms of an infection and to resume once the infection has completely cleared.  - Plan: CBC with Differential/Platelet, COMPLETE METABOLIC PANEL WITH GFR  Fibromyalgia: She continues to experience intermittent myalgias and muscle tenderness due to fibromyalgia.  She has been experiencing some increased myalgias which she attributes to rainy weather.  Discussed importance of regular exercise and good sleep hygiene.  Trochanteric bursitis of both hips:  She has tenderness over bilateral trochanteric bursa.  Good ROM of both hip joints with no groin pain.  Encouraged patient to perform stretching exercises daily.  Primary insomnia: Discussed the importance of good sleep hygiene.   Positive ANA (antinuclear antibody): No clinical features of systemic lupus.   Osteopenia of multiple sites - DEXA updated on 12/12/21: AP spine BMD 0.883 with T score -1.5.  Due to update DEXA April 2025.  She remains on vitamin D 50,000 units once weekly.  Hx of fusion of cervical spine - Patient underwent C3-C4 C4-C5 C5-C6 anterior cervical discectomy fusion on 10/06/2022 performed by Dr. Ophelia Charter. Update x-rays of the C-spine on 03/20/2023.  She only has to follow-up with Dr. Ophelia Charter on an as-needed basis now.  She continues to have some stiffness but her symptoms have improved significantly since undergoing  surgery.  Other medical conditions are listed as follows:   Dyslipidemia  History of gastroesophageal reflux (GERD)  History of renal cell carcinoma  History of COPD  History of diabetes mellitus  History of hypothyroidism  Vitamin D deficiency  Orders: Orders Placed This Encounter  Procedures   XR Hand 2 View Right   XR Hand 2 View Left   XR Foot 2 Views Left   XR Foot 2 Views Right   CBC with Differential/Platelet   COMPLETE METABOLIC PANEL WITH GFR   No orders of the defined types were placed in this encounter.     Follow-Up Instructions: Return in about 3 months (around 06/22/2023) for Rheumatoid arthritis, Fibromyalgia.   Gearldine Bienenstock, PA-C  Note - This record has been created using Dragon software.  Chart creation errors have been sought, but may not always  have been located. Such creation errors do not reflect on  the standard of medical care.

## 2023-03-20 ENCOUNTER — Encounter: Payer: Self-pay | Admitting: Orthopaedic Surgery

## 2023-03-20 ENCOUNTER — Other Ambulatory Visit (HOSPITAL_COMMUNITY): Payer: Self-pay

## 2023-03-20 ENCOUNTER — Ambulatory Visit (INDEPENDENT_AMBULATORY_CARE_PROVIDER_SITE_OTHER): Payer: 59 | Admitting: Orthopaedic Surgery

## 2023-03-20 ENCOUNTER — Other Ambulatory Visit: Payer: Self-pay

## 2023-03-20 VITALS — BP 132/78 | HR 87 | Ht 61.5 in | Wt 198.0 lb

## 2023-03-20 DIAGNOSIS — Z981 Arthrodesis status: Secondary | ICD-10-CM

## 2023-03-20 NOTE — Progress Notes (Signed)
Office Visit Note   Patient: Kimberly Jenkins           Date of Birth: 05-15-1966           MRN: [id number] Visit Date: 03/20/2023              Requested by: Grayland Jack, FNP [address] [place],  [place] [address] PCP: Grayland Jack, FNP   Assessment & Plan: Visit Diagnoses:  1. Status post cervical spinal fusion     Plan: Upper graft level is now continuing to progress with healing.  We will release her from care she is happy with results of surgery and she can return on an as-needed basis.  Follow-Up Instructions: No follow-ups on file.   Orders:  Orders Placed This Encounter  Procedures   XR Cervical Spine 1 View   No orders of the defined types were placed in this encounter.     Procedures: No procedures performed   Clinical Data: No additional findings.   Subjective: Chief Complaint  Patient presents with   Neck - Follow-up    10/06/2022 C3-4, C4-5 ACDF    HPI follow-up 3 level cervical fusion she turns left easily to the right she states her neck is still little bit stiff denies radicular pain.  She has had previous ulnar nerve decompression on the elbow at the right ulnar nerve decompression at Guyon's canal in the hand and also carpal tunnel release which was done a few years for neck surgery.  Neck fusion was 10/06/2022.  Single spine x-ray demonstrates bottom 2 levels completely healed well consolidated.  Top graft has about 50% bridging across the top of the graft and the bone graft is nicely healed into the C4 endplate.  No loosening of screws.  She denies myelopathic symptoms.  She does note that when she sits in the bathtub when she takes a bath her left leg sometimes goes numb.  Review of Systems 14 point system update unchanged.   Objective: Vital Signs: BP 132/78   Pulse 87   Ht 5' 1.5" (1.562 m)   Wt 198 lb (89.8 kg)   BMI 36.81 kg/m   Physical Exam Constitutional:      Appearance: She is well-developed.   HENT:     Head: Normocephalic.     Right Ear: External ear normal.     Left Ear: External ear normal. There is no impacted cerumen.  Eyes:     Pupils: Pupils are equal, round, and reactive to light.  Neck:     Thyroid: No thyromegaly.     Trachea: No tracheal deviation.  Cardiovascular:     Rate and Rhythm: Normal rate.  Pulmonary:     Effort: Pulmonary effort is normal.  Abdominal:     Palpations: Abdomen is soft.  Musculoskeletal:     Cervical back: No rigidity.  Skin:    General: Skin is warm and dry.  Neurological:     Mental Status: She is alert and oriented to person, place, and time.  Psychiatric:        Behavior: Behavior normal.     Ortho Exam negative Spurling 70% rotation of the left 50% cervical rotation of the right.  Good biceps triceps strength normal gait no hyperreflexia.  Specialty Comments:  No specialty comments available.  Imaging: No results found.   PMFS History: Patient Active Problem List   Diagnosis Date Noted   Protrusion of cervical intervertebral disc 07/19/2022   Herniation  of cervical intervertebral disc with radiculopathy 07/19/2022   Other spondylosis with radiculopathy, cervical region 07/04/2022   Cancer of kidney (HCC) 07/11/2021   Dehydration 07/11/2021   Kidney disease 07/11/2021   Hypokalemia 05/11/2020   Costochondritis 05/10/2020   Edema 05/10/2020   Epistaxis 05/10/2020   Fibroma 05/10/2020   Hypercholesterolemia 05/10/2020   Iron deficiency anemia secondary to blood loss (chronic) 05/10/2020   Migraine, unspecified, not intractable, without status migrainosus 05/10/2020   Other chronic diseases of tonsils and adenoids 05/10/2020   Hypertrophy of tonsils 05/10/2020   Asthma 05/07/2020   Fluid overload 05/07/2020   Pain in right knee 08/28/2019   Carpal tunnel syndrome of right wrist 04/17/2019   Ulnar tunnel syndrome of right wrist 04/17/2019   Lesion of ulnar nerve, right upper limb 11/12/2018   Current mild  episode of major depressive disorder without prior episode (HCC) 12/12/2017   Abnormal weight loss 11/15/2017   Fibromyalgia 08/27/2016   History of right rotator cuff tear repair 08/27/2016   Diabetes 08/27/2016   Chronic obstructive pulmonary disease (HCC) 08/27/2016   Dyslipidemia 08/27/2016   History of renal cell carcinoma 08/27/2016   Anxiety 08/27/2016   Sleep apnea 08/27/2016   Rheumatoid arthritis of multiple sites with negative rheumatoid factor  07/18/2016   High risk medication use 07/18/2016   Other specified personal risk factors, not elsewhere classified 07/18/2016   Hand pain 07/13/2016   Foot pain 07/13/2016   Elevated sed rate 07/13/2016   Open wound of toe 05/03/2016   Ataxic gait 05/02/2016   Risk for falls 05/02/2016   Tremor 05/02/2016   Vitamin D deficiency 07/08/2015   Chest pain, unspecified 06/23/2015   Mood disorder (HCC) 06/17/2015   Essential hypertension 05/18/2015   Generalized anxiety disorder 05/12/2015   Acquired hypothyroidism 05/05/2015   Severe persistent asthma 04/30/2015   Other allergic rhinitis 04/30/2015   GERD (gastroesophageal reflux disease) 04/30/2015   Chronic kidney disease, stage 2 (mild) 03/02/2015   Obesity 03/01/2015   Chronic post-traumatic headache 02/17/2010   Depression, unspecified 02/17/2010   History of malignant neoplasm of kidney 02/17/2010   Hyperlipidemia associated with type 2 diabetes mellitus (HCC) 02/17/2010   Other amnesia 02/17/2010   Polyosteoarthritis, unspecified 02/17/2010   Sleep disturbance 02/17/2010   Spinal stenosis in cervical region 02/17/2010   Past Medical History:  Diagnosis Date   Abnormal weight loss 11/15/2017   Acquired hypothyroidism 05/05/2015   ADHD (attention deficit hyperactivity disorder)    Allergic rhinitis    Anxiety 08/27/2016   With depression   Asthma    Ataxic gait 05/02/2016   Cancer of kidney [facility])    Carpal tunnel syndrome of right wrist 04/17/2019   Chest pain,  unspecified 06/23/2015   Formatting of this note might be different from the original. Overview:  Normal pharmacologic MPS Formatting of this note might be different from the original. Normal pharmacologic MPS Formatting of this note might be different from the original. Formatting of this note might be different from the original. Normal pharmacologic MPS Formatting of this note might be different from the original. Form   Chronic kidney disease (CKD), stage II (mild)    Chronic obstructive pulmonary disease (HCC) 08/27/2016   Chronic post-traumatic headache 02/17/2010   Current mild episode of major depressive disorder without prior episode (HCC) 12/12/2017   Dehydration    Depression, unspecified 02/17/2010   Diabetes 08/27/2016   Dyslipidemia 08/27/2016   Edema 05/10/2020   Elevated sed rate 07/13/2016   48 on 05/2016  Epistaxis 05/10/2020   Essential hypertension 05/18/2015   Fatty liver    Fibroma 05/10/2020   Fibromyalgia 08/27/2016   First degree AV block    Fluid overload 05/07/2020   Foot pain 07/13/2016   Generalized anxiety disorder 05/12/2015   GERD (gastroesophageal reflux disease)    Hand pain 07/13/2016   High risk medication use 07/18/2016   History of hiatal hernia    History of malignant neoplasm of kidney 02/17/2010   Formatting of this note might be different from the original. Formatting of this note might be different from the original. Right partial nephrectomy 2008 Formatting of this note might be different from the original. Overview:  Right partial nephrectomy 2008   History of renal cell carcinoma 08/27/2016   Right partial nephrectomy 2008   Hypercholesterolemia 05/10/2020   Formatting of this note might be different from the original. Formatting of this note might be different from the original. 12/07/2014 - TC 151/Trig 199/HDL 47/LDL 64  Lp(a) 10 ( <91)   Hyperlipidemia associated with type 2 diabetes mellitus (HCC) 02/17/2010   Formatting of this note  might be different from the original. Overview:  12/07/2014 - TC 151/Trig 199/HDL 47/LDL 64  Lp(a) 10 ( <47)  Overview:  12/07/2014 - TC 151/Trig 199/HDL 47/LDL 64  Lp(a) 10 ( <82) Formatting of this note might be different from the original. 12/07/2014 - TC 151/Trig 199/HDL 47/LDL 64  Lp(a) 10 ( <95)   Hypertrophy of tonsils 05/10/2020   Hypokalemia 05/11/2020   Hypothyroidism    Iron deficiency anemia secondary to blood loss (chronic) 05/10/2020   Kidney disease    Migraine, unspecified, not intractable, without status migrainosus 05/10/2020   Mood disorder (HCC) 06/17/2015   Obesity 03/01/2015   Open wound of toe 05/03/2016   Osteoarthritis    Other allergic rhinitis 04/30/2015   Other amnesia 02/17/2010   Other chronic diseases of tonsils and adenoids 05/10/2020   Other specified personal risk factors, not elsewhere classified 07/18/2016   Pain in right knee 08/28/2019   Rheumatoid arthritis (HCC)    Risk for falls 05/02/2016   Severe persistent asthma 04/30/2015   Sleep apnea 08/27/2016   On CPAP, 2 L oxygen   Sleep disturbance 02/17/2010   Spinal stenosis in cervical region 02/17/2010   Tremor    Vitamin D deficiency 07/08/2015    Family History  Problem Relation Age of Onset   Asthma Mother    Lung cancer Mother    Asthma Father    Allergic rhinitis Father    COPD Father    Heart attack Father    Arthritis Father    Brain cancer Paternal Uncle    Asthma Maternal Grandmother    Brain cancer Paternal Grandmother    Breast cancer Cousin    Breast cancer Cousin    Breast cancer Cousin    Brain cancer Brother    Heart attack Brother     Past Surgical History:  Procedure Laterality Date   ANTERIOR CERVICAL DECOMP/DISCECTOMY FUSION N/A 10/06/2022   Procedure: C3-4, C4-5, C5-6 ANTERIOR CERVICAL DISCECTOMY FUSION, ALLOGRAFT, PLATE;  Surgeon: Eldred Manges, MD;  Location: [facility] OR;  Service: Orthopedics;  Laterality: N/A;  Needs RNFA   APPENDECTOMY  1983   CESAREAN SECTION      2 times, 1986 & 1983   CHOLECYSTECTOMY  2009   COLONOSCOPY W/ POLYPECTOMY  2021   ELBOW SURGERY Right    ulner nerve release   ESOPHAGEAL DILATION  2021   KIDNEY SURGERY  Right 2008   1/2 right kidney removed   pellet insertion  03/2021   per patient   SHOULDER SURGERY Right 2006   TONSILLECTOMY  2011   TOTAL SHOULDER ARTHROPLASTY Right    TUBAL LIGATION  1986   WRIST SURGERY Right    ulner nerve release   Social History   Occupational History   Not on file  Tobacco Use   Smoking status: Never    Passive exposure: Past   Smokeless tobacco: Never  Vaping Use   Vaping status: Never Used  Substance and Sexual Activity   Alcohol use: No   Drug use: No   Sexual activity: Not on file

## 2023-03-21 ENCOUNTER — Ambulatory Visit (INDEPENDENT_AMBULATORY_CARE_PROVIDER_SITE_OTHER): Payer: 59 | Admitting: *Deleted

## 2023-03-21 DIAGNOSIS — J4550 Severe persistent asthma, uncomplicated: Secondary | ICD-10-CM | POA: Diagnosis not present

## 2023-03-22 ENCOUNTER — Ambulatory Visit: Payer: 59

## 2023-03-22 ENCOUNTER — Encounter: Payer: Self-pay | Admitting: Physician Assistant

## 2023-03-22 ENCOUNTER — Ambulatory Visit: Payer: 59 | Attending: Physician Assistant | Admitting: Physician Assistant

## 2023-03-22 VITALS — BP 113/73 | HR 76 | Resp 15 | Ht 61.5 in | Wt 199.0 lb

## 2023-03-22 DIAGNOSIS — M0609 Rheumatoid arthritis without rheumatoid factor, multiple sites: Secondary | ICD-10-CM

## 2023-03-22 DIAGNOSIS — M7062 Trochanteric bursitis, left hip: Secondary | ICD-10-CM

## 2023-03-22 DIAGNOSIS — M79672 Pain in left foot: Secondary | ICD-10-CM | POA: Diagnosis not present

## 2023-03-22 DIAGNOSIS — Z981 Arthrodesis status: Secondary | ICD-10-CM

## 2023-03-22 DIAGNOSIS — M79642 Pain in left hand: Secondary | ICD-10-CM

## 2023-03-22 DIAGNOSIS — M79641 Pain in right hand: Secondary | ICD-10-CM | POA: Diagnosis not present

## 2023-03-22 DIAGNOSIS — Z8639 Personal history of other endocrine, nutritional and metabolic disease: Secondary | ICD-10-CM

## 2023-03-22 DIAGNOSIS — E785 Hyperlipidemia, unspecified: Secondary | ICD-10-CM

## 2023-03-22 DIAGNOSIS — M7061 Trochanteric bursitis, right hip: Secondary | ICD-10-CM | POA: Diagnosis not present

## 2023-03-22 DIAGNOSIS — Z8709 Personal history of other diseases of the respiratory system: Secondary | ICD-10-CM

## 2023-03-22 DIAGNOSIS — F5101 Primary insomnia: Secondary | ICD-10-CM

## 2023-03-22 DIAGNOSIS — Z8719 Personal history of other diseases of the digestive system: Secondary | ICD-10-CM

## 2023-03-22 DIAGNOSIS — M8589 Other specified disorders of bone density and structure, multiple sites: Secondary | ICD-10-CM

## 2023-03-22 DIAGNOSIS — Z85528 Personal history of other malignant neoplasm of kidney: Secondary | ICD-10-CM

## 2023-03-22 DIAGNOSIS — M797 Fibromyalgia: Secondary | ICD-10-CM

## 2023-03-22 DIAGNOSIS — Z79899 Other long term (current) drug therapy: Secondary | ICD-10-CM | POA: Diagnosis not present

## 2023-03-22 DIAGNOSIS — E559 Vitamin D deficiency, unspecified: Secondary | ICD-10-CM

## 2023-03-22 DIAGNOSIS — R768 Other specified abnormal immunological findings in serum: Secondary | ICD-10-CM

## 2023-03-22 DIAGNOSIS — M79671 Pain in right foot: Secondary | ICD-10-CM

## 2023-03-22 LAB — CBC WITH DIFFERENTIAL/PLATELET
Absolute Monocytes: 634 cells/uL (ref 200–950)
Basophils Absolute: 40 cells/uL (ref 0–200)
Basophils Relative: 0.6 %
Eosinophils Absolute: 53 cells/uL (ref 15–500)
Eosinophils Relative: 0.8 %
HCT: 40.5 % (ref 35.0–45.0)
Hemoglobin: 13 g/dL (ref 11.7–15.5)
Lymphs Abs: 2719 cells/uL (ref 850–3900)
MCH: 27.7 pg (ref 27.0–33.0)
MCHC: 32.1 g/dL (ref 32.0–36.0)
MCV: 86.2 fL (ref 80.0–100.0)
MPV: 10.2 fL (ref 7.5–12.5)
Monocytes Relative: 9.6 %
Neutro Abs: 3155 cells/uL (ref 1500–7800)
Platelets: 254 10*3/uL (ref 140–400)
RBC: 4.7 10*6/uL (ref 3.80–5.10)
RDW: 14.1 % (ref 11.0–15.0)
Total Lymphocyte: 41.2 %
WBC: 6.6 10*3/uL (ref 3.8–10.8)

## 2023-03-22 NOTE — Patient Instructions (Signed)

## 2023-03-22 NOTE — Progress Notes (Signed)
CBC WNl

## 2023-03-23 LAB — CBC WITH DIFFERENTIAL/PLATELET: Neutrophils Relative %: 47.8 %

## 2023-03-23 NOTE — Progress Notes (Signed)
X-rays of both feet and both hands are consistent with osteoarthritis and rheumatoid arthritis overlap.  No radiographic progressions since 2021.   Please notify the patient.

## 2023-03-23 NOTE — Progress Notes (Signed)
CMP WNL

## 2023-03-29 ENCOUNTER — Other Ambulatory Visit: Payer: Self-pay

## 2023-03-29 ENCOUNTER — Other Ambulatory Visit: Payer: Self-pay | Admitting: Physician Assistant

## 2023-03-29 NOTE — Telephone Encounter (Signed)
Last Fill: 01/01/2023  Labs: 03/22/2023 CBC and CMP WNL  Next Visit: 06/25/2023  Last Visit: 03/22/2023  DX: Rheumatoid arthritis of multiple sites with negative rheumatoid factor   Current Dose per office note 03/22/2023: Arava 20 mg by mouth daily   Okay to refill Arava ?

## 2023-04-18 ENCOUNTER — Ambulatory Visit: Payer: 59

## 2023-04-18 ENCOUNTER — Encounter: Payer: Self-pay | Admitting: Allergy and Immunology

## 2023-04-18 ENCOUNTER — Ambulatory Visit (INDEPENDENT_AMBULATORY_CARE_PROVIDER_SITE_OTHER): Payer: 59 | Admitting: Allergy and Immunology

## 2023-04-18 VITALS — BP 130/70 | HR 92 | Resp 16 | Ht 61.6 in | Wt 200.2 lb

## 2023-04-18 DIAGNOSIS — B370 Candidal stomatitis: Secondary | ICD-10-CM | POA: Diagnosis not present

## 2023-04-18 DIAGNOSIS — J4550 Severe persistent asthma, uncomplicated: Secondary | ICD-10-CM

## 2023-04-18 DIAGNOSIS — D849 Immunodeficiency, unspecified: Secondary | ICD-10-CM

## 2023-04-18 DIAGNOSIS — K219 Gastro-esophageal reflux disease without esophagitis: Secondary | ICD-10-CM | POA: Diagnosis not present

## 2023-04-18 DIAGNOSIS — J3089 Other allergic rhinitis: Secondary | ICD-10-CM | POA: Diagnosis not present

## 2023-04-18 MED ORDER — IPRATROPIUM-ALBUTEROL 0.5-2.5 (3) MG/3ML IN SOLN: RESPIRATORY_TRACT | 1 refills | Status: DC

## 2023-04-18 NOTE — Progress Notes (Unsigned)
[place] - [place] - [place] - [place] - [place]   Follow-up Note  Referring Provider: Grayland Jack, * Primary Provider: Grayland Jack, FNP Date of Office Visit: 04/18/2023  Subjective:   Kimberly Jenkins (DOB: February 06, 1966) is a 57 y.o. female who [facility] in re-evaluation of the following:  HPI: Kimberly Jenkins returns to this clinic in evaluation of asthma, allergic rhinitis, LPR, recurrent thrush, in the setting of immunosuppression for rheumatoid arthritis.  I last saw her in this clinic 19 October 2022.  Overall she thinks that her asthma is been under pretty good control.  She has gained some weight and this has affected her ability to walk and she sometimes now needs to use the short acting bronchodilator around the time she is walking but otherwise has very little issues with wheezing or coughing or shortness of breath and does not have any nocturnal bronchospastic symptoms.  It does not sound as though she required a systemic steroid to treat an exacerbation of asthma.  She has had very little problems with her upper airway so using montelukast and Flonase.  She did contract a viral respiratory tract infection a few weeks ago for which she was apparently given an antibiotic and some cough medicine but fortunately that has completely resolved.  She believes that her reflux is under very good control on her current plan of therapy.  She continues on Humira for her rheumatoid arthritis.  Allergies as of 04/18/2023       Reactions   Methotrexate Diarrhea   Advair Diskus [fluticasone-salmeterol] Itching, Nausea And Vomiting   Aspirin Nausea And Vomiting   Ciprofloxacin Nausea And Vomiting   Cephalexin Nausea And Vomiting   Clonidine Derivatives Rash   Patches blistered skin        Medication List    ALIGN PO Take 1 tablet by mouth daily.   amphetamine-dextroamphetamine 15 MG tablet Commonly known as:  ADDERALL Take 15 mg by mouth 2 (two) times daily.   Breo Ellipta 200-25 MCG/INH Aepb Generic drug: fluticasone furoate-vilanterol Inhale one puff once daily   busPIRone 15 MG tablet Commonly known as: BUSPAR Take 15 mg by mouth 3 (three) times daily.   cetirizine 10 MG tablet Commonly known as: ZYRTEC Take 10 mg by mouth daily.   clorazepate 3.75 MG tablet Commonly known as: TRANXENE Take 3.75 mg by mouth in the morning, at noon, and at bedtime.   cyclobenzaprine 10 MG tablet Commonly known as: FLEXERIL Take 10 mg by mouth at bedtime.   EPINEPHrine 0.3 mg/0.3 mL Soaj injection Commonly known as: EpiPen 2-Pak Use as directed for life-threatening allergic reaction.   famotidine 20 MG tablet Commonly known as: PEPCID Take 20 mg by mouth at bedtime.   fluconazole 150 MG tablet Commonly known as: DIFLUCAN Take by mouth.   FLUoxetine 40 MG capsule Commonly known as: PROZAC Take 80 mg by mouth daily.   fluticasone 50 MCG/ACT nasal spray Commonly known as: FLONASE Place 2 sprays into both nostrils daily as needed for allergies.   furosemide 40 MG tablet Commonly known as: LASIX Take 20 mg by mouth every other day.   gabapentin 600 MG tablet Commonly known as: NEURONTIN Take 600 mg by mouth 3 (three) times daily.   Humira (2 Pen) 40 MG/0.8ML Pnkt pen Generic drug: adalimumab INJECT 40 MG INTO THE SKIN EVERY 14 DAYS   icosapent Ethyl 1 g capsule Commonly known as: VASCEPA Take 1 g by mouth  2 (two) times daily.   Ipratropium-Albuterol 20-100 MCG/ACT Aers respimat Commonly known as: COMBIVENT Inhale 1 puff into the lungs every 6 (six) hours as needed for wheezing.   ipratropium-albuterol 0.5-2.5 (3) MG/3ML Soln Commonly known as: DUONEB CAN USE ONE VIAL IN NEBULIZER EVERY 4-6 HOURS AS NEEDED FOR COUGH OR WHEEZE   lactulose 10 GM/15ML solution Commonly known as: CHRONULAC SMARTSIG:Milliliter(s) By Mouth   Lantus SoloStar 100 UNIT/ML Solostar Pen Generic  drug: insulin glargine Inject 45 Units into the skin at bedtime. Hold if blood sugar is <130   leflunomide 10 MG tablet Commonly known as: ARAVA TAKE 2 TABLETS BY MOUTH EVERY DAY   levothyroxine 50 MCG tablet Commonly known as: SYNTHROID Take 50 mcg by mouth daily before breakfast.   Linzess 72 MCG capsule Generic drug: linaclotide Take 72 mcg by mouth daily as needed (constipation).   mesalamine 1.2 g EC tablet Commonly known as: LIALDA Take 2.4 g by mouth daily.   montelukast 10 MG tablet Commonly known as: SINGULAIR Take 10 mg by mouth at bedtime.   naloxone 4 MG/0.1ML Liqd nasal spray kit Commonly known as: NARCAN Place 1 spray into the nose as directed.   Nucala 100 MG injection Generic drug: mepolizumab INJECT 100MG  SUBCUTANEOUSLY EVERY 4 WEEKS   nystatin 100000 UNIT/ML suspension Commonly known as: MYCOSTATIN Swish and spit with 5 mls after Breo/Spiriva use.   nystatin cream Commonly known as: MYCOSTATIN Apply topically.   olopatadine 0.1 % ophthalmic solution Commonly known as: PATANOL CAN USE ONE DROP IN EACH EYE TWICE DAILY IF NEEDED FOR RED, ITCHY, WATERY EYES.   omeprazole 40 MG capsule Commonly known as: PRILOSEC Take 40 mg by mouth 2 (two) times daily.   ondansetron 8 MG tablet Commonly known as: ZOFRAN Take 8 mg by mouth every 8 (eight) hours as needed for nausea or vomiting.   Oxycodone HCl 10 MG Tabs Take 10 mg by mouth every 8 (eight) hours.   Ozempic (2 MG/DOSE) 8 MG/3ML Sopn Generic drug: Semaglutide (2 MG/DOSE) Inject 2 mg into the skin every Monday.   potassium chloride SA 20 MEQ tablet Commonly known as: KLOR-CON M Take 20 mEq by mouth 2 (two) times daily.   pramipexole 0.125 MG tablet Commonly known as: MIRAPEX Take 0.125 mg by mouth at bedtime.   pravastatin 40 MG tablet Commonly known as: PRAVACHOL Take 40 mg by mouth daily.   primidone 250 MG tablet Commonly known as: MYSOLINE Take 250 mg by mouth at bedtime.    promethazine-dextromethorphan 6.25-15 MG/5ML syrup Commonly known as: PROMETHAZINE-DM Take 5 mLs by mouth 4 (four) times daily as needed for cough.   Rexulti 3 MG Tabs Generic drug: Brexpiprazole Take 1 tablet by mouth daily.   Spiriva Respimat 1.25 MCG/ACT Aers Generic drug: Tiotropium Bromide Monohydrate Inhale two puffs once daily   topiramate 50 MG tablet Commonly known as: TOPAMAX Take 50 mg by mouth 2 (two) times daily.   triamcinolone cream 0.1 % Commonly known as: KENALOG Apply 1 application  topically daily as needed (cracks at the corners of her mouth).   Vitamin D (Ergocalciferol) 1.25 MG (50000 UNIT) Caps capsule Commonly known as: DRISDOL Take 50,000 Units by mouth every Sunday.    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 Overview:  Normal pharmacologic MPS Formatting of this note might be different from the original. Normal pharmacologic MPS Formatting of this note might be different from the original. Formatting of this note might be different from the original. Normal pharmacologic MPS Formatting of this note might be different from the original. Form   Chronic kidney disease (CKD), stage II (mild)    Chronic obstructive pulmonary disease (HCC) 08/27/2016   Chronic post-traumatic headache 02/17/2010   Current mild episode of major depressive disorder without prior episode (HCC) 12/12/2017   Dehydration    Depression, unspecified 02/17/2010   Diabetes 08/27/2016   Dyslipidemia 08/27/2016   Edema 05/10/2020   Elevated sed rate 07/13/2016   48 on 05/2016   Epistaxis  05/10/2020   Essential hypertension 05/18/2015   Fatty liver    Fibroma 05/10/2020   Fibromyalgia 08/27/2016   First degree AV block    Fluid overload 05/07/2020   Foot pain 07/13/2016   Generalized anxiety disorder 05/12/2015   GERD (gastroesophageal reflux disease)    Hand pain 07/13/2016   High risk medication use 07/18/2016   History of hiatal hernia    History of malignant neoplasm of kidney 02/17/2010   Formatting of this note might be different from the original. Formatting of this note might be different from the original. Right partial nephrectomy 2008 Formatting of this note might be different from the original. Overview:  Right partial nephrectomy 2008   History of renal cell carcinoma 08/27/2016   Right partial nephrectomy 2008   Hypercholesterolemia 05/10/2020   Formatting of this note might be different from the original. Formatting of this note might be different from the original. 12/07/2014 - TC 151/Trig 199/HDL 47/LDL 64  Lp(a) 10 ( <16)   Hyperlipidemia associated with type 2 diabetes mellitus (HCC) 02/17/2010   Formatting of this note might be different from the original. Overview:  12/07/2014 - TC 151/Trig 199/HDL 47/LDL 64  Lp(a) 10 ( <10)  Overview:  12/07/2014 - TC 151/Trig 199/HDL 47/LDL 64  Lp(a) 10 ( <96) Formatting of this note might be different from the original. 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   Kidney disease    Migraine, unspecified, not intractable, without status migrainosus 05/10/2020   Mood disorder (HCC) 06/17/2015   Obesity 03/01/2015   Open wound of toe 05/03/2016   Osteoarthritis    Other allergic rhinitis 04/30/2015   Other amnesia 02/17/2010   Other chronic diseases of tonsils and adenoids 05/10/2020   Other specified personal risk factors, not elsewhere classified  07/18/2016   Pain in right knee 08/28/2019   Rheumatoid arthritis (HCC)    Risk for falls 05/02/2016   Severe persistent asthma 04/30/2015   Sleep apnea 08/27/2016   On CPAP, 2 L oxygen   Sleep disturbance 02/17/2010   Spinal stenosis in cervical region 02/17/2010   Tremor    Vitamin D deficiency 07/08/2015    Past Surgical History:  Procedure Laterality Date   ANTERIOR CERVICAL DECOMP/DISCECTOMY FUSION N/A 10/06/2022   Procedure: C3-4, C4-5, C5-6 ANTERIOR CERVICAL DISCECTOMY FUSION, ALLOGRAFT, PLATE;  Surgeon: Eldred Manges, MD;  Location: [facility] OR;  Service: Orthopedics;  Laterality: N/A;  Needs RNFA   APPENDECTOMY  1983   CESAREAN SECTION     2 times, 1986 & 1983   CHOLECYSTECTOMY  2009   COLONOSCOPY W/ POLYPECTOMY  2021   ELBOW SURGERY Right    ulner nerve release   ESOPHAGEAL DILATION  2021   KIDNEY SURGERY Right 2008   1/2 right kidney removed   pellet insertion  03/2021   per patient   SHOULDER SURGERY Right 2006   TONSILLECTOMY  2011   TOTAL SHOULDER ARTHROPLASTY Right    TUBAL LIGATION  1986   WRIST SURGERY Right    ulner nerve release    Review of systems negative except as noted in HPI / PMHx or noted below:  Review of Systems  Constitutional: Negative.   HENT: Negative.    Eyes: Negative.   Respiratory: Negative.    Cardiovascular: Negative.   Gastrointestinal: Negative.   Genitourinary: Negative.   Musculoskeletal: Negative.   Skin: Negative.   Neurological: Negative.   Endo/Heme/Allergies: Negative.   Psychiatric/Behavioral: Negative.       Objective:   Vitals:   04/18/23 1614  BP: 130/70  Pulse: 92  Resp: 16  SpO2: 96%   Height: 5' 1.6" (156.5 cm)  Weight: 200 lb 3.2 oz (90.8 kg)   Physical Exam Constitutional:      Appearance: She is not diaphoretic.  HENT:     Head: Normocephalic.     Right Ear: Tympanic membrane, ear canal and external ear normal.     Left Ear: Tympanic membrane, ear canal and external ear normal.     Nose: Nose  normal. No mucosal edema or rhinorrhea.     Mouth/Throat:     Pharynx: Uvula midline. No oropharyngeal exudate.  Eyes:     Conjunctiva/sclera: Conjunctivae normal.  Neck:     Thyroid: No thyromegaly.     Trachea: Trachea normal. No tracheal tenderness or tracheal deviation.  Cardiovascular:     Rate and Rhythm: Normal rate and regular rhythm.     Heart sounds: Normal heart sounds, S1 normal and S2 normal. No murmur heard. Pulmonary:     Effort: No respiratory distress.     Breath sounds: Normal breath sounds. No stridor. No wheezing or rales.  Lymphadenopathy:     Head:     Right side of head: No tonsillar adenopathy.     Left side of head: No tonsillar adenopathy.     Cervical: No cervical adenopathy.  Skin:    Findings: No erythema or rash.     Nails: There is no clubbing.  Neurological:     Mental Status: She is alert.     Diagnostics:    Spirometry was performed and demonstrated an FEV1 of 1.57 at 68 % of predicted.  Assessment and Plan:   1. Asthma, severe persistent, well-controlled   2. Other allergic rhinitis   3. LPRD (laryngopharyngeal reflux disease)   4. Thrush   5. Immunosuppression (HCC)    1. Continue Breo 200 - 1 inhalation +  Spiriva 1.25 Respimat - 2 inhalations daily   2. Continue Mepolizumab injections every 4 weeks   3. Continue Montelukast 10 mg - 1 tablet one time per day  4. Continue Flonase - 1-2 sprays each nostril 1 time per day  5. Continue Omeprazole 40 mg - 1 tablet two times per day + Pepcid 20 mg in evening  6. If needed:  A. Nystatin oral solution - 5 mls swish and spit after BREO / Spiriva  use B. Duoneb / Albuterol / Combivent Respimat 2 puffs every 4-6 hours C. Zyrtec   7. Return to clinic in 6 months or earlier if problem  8. Obtain fall flu vaccine  Kimberly Jenkins appears to be doing pretty well regarding her respiratory tract disease and her reflux disease and she will continue on a collection of anti-inflammatory agents for  airway and therapy directed against reflux and if needed some therapy for rash.  I will see her back in this clinic in 6 months or earlier if there is a problem.  Laurette Schimke, MD Allergy / Immunology [facility]

## 2023-04-18 NOTE — Patient Instructions (Addendum)
  1. Continue Breo 200 - 1 inhalation +  Spiriva 1.25 Respimat - 2 inhalations daily   2. Continue Mepolizumab injections every 4 weeks   3. Continue Montelukast 10 mg - 1 tablet one time per day  4. Continue Flonase - 1-2 sprays each nostril 1 time per day  5. Continue Omeprazole 40 mg - 1 tablet two times per day + Pepcid 20 mg in evening  6. If needed:  A. Nystatin oral solution - 5 mls swish and spit after BREO / Spiriva use B. Duoneb / Albuterol / Combivent Respimat 2 puffs every 4-6 hours C. Zyrtec   7. Return to clinic in 6 months or earlier if problem  8. Obtain fall flu vaccine

## 2023-04-19 ENCOUNTER — Ambulatory Visit: Payer: 59 | Admitting: Allergy and Immunology

## 2023-04-19 ENCOUNTER — Encounter: Payer: Self-pay | Admitting: Allergy and Immunology

## 2023-04-24 ENCOUNTER — Other Ambulatory Visit (HOSPITAL_COMMUNITY): Payer: Self-pay

## 2023-04-27 ENCOUNTER — Other Ambulatory Visit: Payer: Self-pay

## 2023-04-27 ENCOUNTER — Other Ambulatory Visit: Payer: Self-pay | Admitting: Physician Assistant

## 2023-04-27 ENCOUNTER — Other Ambulatory Visit (HOSPITAL_COMMUNITY): Payer: Self-pay

## 2023-04-27 MED ORDER — HUMIRA (2 PEN) 40 MG/0.8ML ~~LOC~~ AJKT
AUTO-INJECTOR | SUBCUTANEOUS | 0 refills | Status: DC
  Filled 2023-04-27: qty 2, 28d supply, fill #0
  Filled 2023-05-28: qty 2, 28d supply, fill #1
  Filled 2023-07-03: qty 2, 28d supply, fill #2

## 2023-04-27 NOTE — Telephone Encounter (Signed)
Last Fill: 12/25/2022  Labs: 03/22/2023 CMP WNL CBC WNl   TB Gold: 12/21/2022 Neg    Next Visit: 06/25/2023  Last Visit: 03/22/2023  DX: Rheumatoid arthritis of multiple sites with negative rheumatoid factor    Current Dose per office note 03/22/2023: Humira 40 mg sq injections every 14 days   Okay to refill Humira?

## 2023-05-16 ENCOUNTER — Ambulatory Visit: Payer: 59

## 2023-05-28 ENCOUNTER — Other Ambulatory Visit (HOSPITAL_COMMUNITY): Payer: Self-pay

## 2023-05-28 NOTE — Progress Notes (Signed)
Specialty Pharmacy Refill Coordination Note  Jimesha Rising is a 57 y.o. female contacted today regarding refills of specialty medication(s) Adalimumab .  Patient requested Delivery  on 06/07/23  to verified address [address] [place] [id number]   Medication will be filled on 06/06/23.      Specialty Pharmacy Ongoing Clinical Assessment Note  Kateena Degroote is a 57 y.o. female who is being followed by the specialty pharmacy service for RxSp Rheumatoid Arthritis   Review of patient's specialty medication(s) Adalimumab  occurred today.   Patient has missed 0  doses in the last 4 weeks.   Patient did not have any additional questions or concerns.   Therapeutic benefit summary: Patient is achieving benefit   Adverse events/side effects summary: No adverse events/side effects   Patient's therapy is appropriate to : Continue    Goals      Minimize recurrence of flares     Patient is on track. Patient will maintain adherence         Follow up:  6 months

## 2023-05-30 ENCOUNTER — Ambulatory Visit: Payer: 59

## 2023-05-30 DIAGNOSIS — J4550 Severe persistent asthma, uncomplicated: Secondary | ICD-10-CM | POA: Diagnosis not present

## 2023-06-01 ENCOUNTER — Other Ambulatory Visit: Payer: Self-pay | Admitting: Allergy and Immunology

## 2023-06-11 NOTE — Progress Notes (Signed)
Office Visit Note  Patient: Kimberly Jenkins             Date of Birth: Nov 22, 1965           MRN: [id number]             PCP: Grayland Jack, FNP Referring: Grayland Jack, * Visit Date: 06/25/2023 Occupation: @GUAROCC @  Subjective:  Left trochanteric bursitis   History of Present Illness: Kimberly Jenkins is a 57 y.o. female with history of seronegative rheumatoid arthritis and fibromyalgia. She remains on Humira 40 mg sq injections every 14 days (11/02/2016) and Arava 20 mg by mouth daily (12/13/2016). She is tolerating combination therapy without any side effects.  She denies any interruptions in therapy.  She denies any recent or recurrent infections.  She denies any signs or symptoms of a rheumatoid arthritis flare.  Patient states for the past 1 month she has been experiencing pain on the lateral aspect of her left hip.  She denies any fall or injury prior to the onset of symptoms.  Patient states that the pain has been worse at night when lying on her left side.  She has tried using ice but no other topical agents.  She denies any groin pain.    Activities of Daily Living:  Patient reports morning stiffness for 1 hour.   Patient Reports nocturnal pain.  Difficulty dressing/grooming: Reports Difficulty climbing stairs: Reports Difficulty getting out of chair: Denies Difficulty using hands for taps, buttons, cutlery, and/or writing: Reports  Review of Systems  Constitutional:  Positive for fatigue.  HENT:  Positive for mouth dryness. Negative for mouth sores and nose dryness.   Eyes:  Negative for pain, visual disturbance and dryness.  Respiratory:  Positive for shortness of breath. Negative for cough, hemoptysis and difficulty breathing.   Cardiovascular:  Positive for swelling in legs/feet. Negative for chest pain, palpitations and hypertension.  Gastrointestinal:  Positive for constipation and diarrhea. Negative for blood in stool.  Endocrine: Negative  for increased urination.  Genitourinary:  Negative for painful urination.  Musculoskeletal:  Positive for joint pain, joint pain, myalgias, morning stiffness, muscle tenderness and myalgias. Negative for joint swelling and muscle weakness.  Skin:  Negative for color change, pallor, rash, hair loss, nodules/bumps, skin tightness, ulcers and sensitivity to sunlight.  Allergic/Immunologic: Positive for susceptible to infections.  Neurological:  Positive for numbness and headaches. Negative for dizziness and weakness.  Hematological:  Negative for swollen glands.  Psychiatric/Behavioral:  Positive for depressed mood and sleep disturbance. The patient is nervous/anxious.     PMFS History:  Patient Active Problem List   Diagnosis Date Noted   Protrusion of cervical intervertebral disc 07/19/2022   Herniation of cervical intervertebral disc with radiculopathy 07/19/2022   Other spondylosis with radiculopathy, cervical region 07/04/2022   Cancer of kidney (HCC) 07/11/2021   Dehydration 07/11/2021   Kidney disease 07/11/2021   Hypokalemia 05/11/2020   Costochondritis 05/10/2020   Edema 05/10/2020   Epistaxis 05/10/2020   Fibroma 05/10/2020   Hypercholesterolemia 05/10/2020   Iron deficiency anemia secondary to blood loss (chronic) 05/10/2020   Migraine, unspecified, not intractable, without status migrainosus 05/10/2020   Other chronic diseases of tonsils and adenoids 05/10/2020   Hypertrophy of tonsils 05/10/2020   Asthma 05/07/2020   Fluid overload 05/07/2020   Pain in right knee 08/28/2019   Carpal tunnel syndrome of right wrist 04/17/2019   Ulnar tunnel syndrome of right wrist 04/17/2019   Lesion of ulnar nerve, right upper limb  11/12/2018   Current mild episode of major depressive disorder without prior episode (HCC) 12/12/2017   Abnormal weight loss 11/15/2017   Fibromyalgia 08/27/2016   History of right rotator cuff tear repair 08/27/2016   Diabetes 08/27/2016   Chronic  obstructive pulmonary disease (HCC) 08/27/2016   Dyslipidemia 08/27/2016   History of renal cell carcinoma 08/27/2016   Anxiety 08/27/2016   Sleep apnea 08/27/2016   Rheumatoid arthritis of multiple sites with negative rheumatoid factor  07/18/2016   High risk medication use 07/18/2016   Other specified personal risk factors, not elsewhere classified 07/18/2016   Hand pain 07/13/2016   Foot pain 07/13/2016   Elevated sed rate 07/13/2016   Open wound of toe 05/03/2016   Ataxic gait 05/02/2016   Risk for falls 05/02/2016   Tremor 05/02/2016   Vitamin D deficiency 07/08/2015   Chest pain, unspecified 06/23/2015   Mood disorder (HCC) 06/17/2015   Essential hypertension 05/18/2015   Generalized anxiety disorder 05/12/2015   Acquired hypothyroidism 05/05/2015   Severe persistent asthma 04/30/2015   Other allergic rhinitis 04/30/2015   GERD (gastroesophageal reflux disease) 04/30/2015   Chronic kidney disease, stage 2 (mild) 03/02/2015   Obesity 03/01/2015   Chronic post-traumatic headache 02/17/2010   Depression, unspecified 02/17/2010   History of malignant neoplasm of kidney 02/17/2010   Hyperlipidemia associated with type 2 diabetes mellitus (HCC) 02/17/2010   Other amnesia 02/17/2010   Polyosteoarthritis, 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 Overview:  Normal pharmacologic MPS Formatting of this note might be different from the original. Normal pharmacologic MPS Formatting of  this note might be different from the original. Formatting of this note might be different from the original. Normal pharmacologic MPS Formatting of this note might be different from the original. Form   Chronic kidney disease (CKD), stage II (mild)    Chronic obstructive pulmonary disease (HCC) 08/27/2016   Chronic post-traumatic headache 02/17/2010   Current mild episode of major depressive disorder without prior episode (HCC) 12/12/2017   Dehydration    Depression, unspecified 02/17/2010   Diabetes 08/27/2016   Dyslipidemia 08/27/2016   Edema 05/10/2020   Elevated sed rate 07/13/2016   48 on 05/2016   Epistaxis 05/10/2020   Essential hypertension 05/18/2015   Fatty liver    Fibroma 05/10/2020   Fibromyalgia 08/27/2016   First degree AV block    Fluid overload 05/07/2020   Foot pain 07/13/2016   Generalized anxiety disorder 05/12/2015   GERD (gastroesophageal reflux disease)    Hand pain 07/13/2016   High risk medication use 07/18/2016   History of hiatal hernia    History of malignant neoplasm of kidney 02/17/2010   Formatting of this note might be different from the original. Formatting of this note might be different from the original. Right partial nephrectomy 2008 Formatting of this note might be different from the original. Overview:  Right partial nephrectomy 2008   History of renal cell carcinoma 08/27/2016   Right partial nephrectomy  2008   Hypercholesterolemia 05/10/2020   Formatting of this note might be different from the original. Formatting of this note might be different from the original. 12/07/2014 - TC 151/Trig 199/HDL 47/LDL 64  Lp(a) 10 ( <16)   Hyperlipidemia associated with type 2 diabetes mellitus (HCC) 02/17/2010   Formatting of this note might be different from the original. Overview:  12/07/2014 - TC 151/Trig 199/HDL 47/LDL 64  Lp(a) 10 ( <10)  Overview:  12/07/2014 - TC 151/Trig 199/HDL 47/LDL 64  Lp(a) 10 ( <96) Formatting of this note might be different  from the original. 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   Kidney disease    Migraine, unspecified, not intractable, without status migrainosus 05/10/2020   Mood disorder (HCC) 06/17/2015   Obesity 03/01/2015   Open wound of toe 05/03/2016   Osteoarthritis    Other allergic rhinitis 04/30/2015   Other amnesia 02/17/2010   Other chronic diseases of tonsils and adenoids 05/10/2020   Other specified personal risk factors, not elsewhere classified 07/18/2016   Pain in right knee 08/28/2019   Rheumatoid arthritis (HCC)    Risk for falls 05/02/2016   Severe persistent asthma 04/30/2015   Sleep apnea 08/27/2016   On CPAP, 2 L oxygen   Sleep disturbance 02/17/2010   Spinal stenosis in cervical region 02/17/2010   Tremor    Vitamin D deficiency 07/08/2015    Family History  Problem Relation Age of Onset   Asthma Mother    Lung cancer Mother    Asthma Father    Allergic rhinitis Father    COPD Father    Heart attack Father    Arthritis Father    Brain cancer Paternal Uncle    Asthma Maternal Grandmother    Brain cancer Paternal Grandmother    Breast cancer Cousin    Breast cancer Cousin    Breast cancer Cousin    Brain cancer Brother    Heart attack Brother    Past Surgical History:  Procedure Laterality Date   ANTERIOR CERVICAL DECOMP/DISCECTOMY FUSION N/A 10/06/2022   Procedure: C3-4, C4-5, C5-6 ANTERIOR CERVICAL DISCECTOMY FUSION, ALLOGRAFT, PLATE;  Surgeon: Eldred Manges, MD;  Location: [facility] OR;  Service: Orthopedics;  Laterality: N/A;  Needs RNFA   APPENDECTOMY  1983   CESAREAN SECTION     2 times, 1986 & 1983   CHOLECYSTECTOMY  2009   COLONOSCOPY W/ POLYPECTOMY  2021   ELBOW SURGERY Right    ulner nerve release   ESOPHAGEAL DILATION  2021   KIDNEY SURGERY Right 2008   1/2 right kidney removed    pellet insertion  03/2021   per patient   SHOULDER SURGERY Right 2006   TONSILLECTOMY  2011   TOTAL SHOULDER ARTHROPLASTY Right    TUBAL LIGATION  1986   WRIST SURGERY Right    ulner nerve release   Social History   Social History Narrative   Not on file   Immunization History  Administered Date(s) Administered   PFIZER(Purple Top)SARS-COV-2 Vaccination 11/18/2019, 12/09/2019, 08/05/2020     Objective: Vital Signs: BP 124/80 (BP Location: Left Arm, Patient Position: Sitting, Cuff Size: Normal)   Pulse 74   Resp 15   Ht 5' 1.5" (1.562 m)   Wt 207 lb 9.6 oz (94.2 kg)   BMI 38.59 kg/m  Physical Exam Vitals and nursing note reviewed.  Constitutional:      Appearance: She is well-developed.  HENT:     Head: Normocephalic and atraumatic.  Eyes:     Conjunctiva/sclera: Conjunctivae normal.  Cardiovascular:     Rate and Rhythm: Normal rate and regular rhythm.     Heart sounds: Normal heart sounds.  Pulmonary:     Effort: Pulmonary effort is normal.     Breath sounds: Normal breath sounds.  Abdominal:     General: Bowel sounds are normal.     Palpations: Abdomen is soft.  Musculoskeletal:     Cervical back: Normal range of motion.  Lymphadenopathy:     Cervical: No cervical adenopathy.  Skin:    General: Skin is warm and dry.     Capillary Refill: Capillary refill takes less than 2 seconds.  Neurological:     Mental Status: She is alert and oriented to person, place, and time.  Psychiatric:        Behavior: Behavior normal.      Musculoskeletal Exam: C-spine limited ROM with lateral rotation.  Thoracic kyphosis noted.  Shoulder joints have good ROM. Elbow joints, wrist joints, MCPs, PIPs, and DIPs good ROM with no synovitis.  Complete fist formation bilaterally.  Hip joints have good ROM with no groin pain.  Knee joints have good ROM with no warmth or effusion.  Ankle joints have good ROM with no tenderness or joint swelling. Tenderness over the left IT band and  trochanteric bursa.   CDAI Exam: CDAI Score: -- Patient Global: 10 / 100; Provider Global: 10 / 100 Swollen: --; Tender: -- Joint Exam 06/25/2023   No joint exam has been documented for this visit   There is currently no information documented on the homunculus. Go to the Rheumatology activity and complete the homunculus joint exam.  Investigation: No additional findings.  Imaging: No results found.  Recent Labs: Lab Results  Component Value Date   WBC 6.6 03/22/2023   HGB 13.0 03/22/2023   PLT 254 03/22/2023   NA 140 03/22/2023   K 4.5 03/22/2023   CL 107 03/22/2023   CO2 23 03/22/2023   GLUCOSE 97 03/22/2023   BUN 9 03/22/2023   CREATININE 0.97 03/22/2023   BILITOT 0.2 03/22/2023   ALKPHOS 81 10/04/2022   AST 19 03/22/2023   ALT 17 03/22/2023   PROT 7.0 03/22/2023   ALBUMIN 3.3 (L) 10/04/2022   CALCIUM 9.0 03/22/2023   GFRAA 79 11/03/2020   QFTBGOLD NEGATIVE 06/26/2017   QFTBGOLDPLUS NEGATIVE 12/21/2022    Speciality Comments: No specialty comments available.  Procedures:  Large Joint Inj: L greater trochanter on 06/25/2023 10:42 AM Indications: pain Details: 27 G 1.5 in needle, lateral approach  Arthrogram: No  Medications: 1.5 mL lidocaine 1 %; 40 mg triamcinolone acetonide 40 MG/ML Aspirate: 0 mL Outcome: tolerated well, no immediate complications Procedure, treatment alternatives, risks and benefits explained, specific risks discussed. Consent was given by the patient. Immediately prior to procedure a time out was called to verify the correct patient, procedure, equipment, support staff and site/side marked as required. Patient was prepped and draped in the usual sterile fashion.     Allergies: Methotrexate, Advair diskus [fluticasone-salmeterol], Aspirin, Ciprofloxacin, Cephalexin, and Clonidine derivatives   Assessment / Plan:     Visit Diagnoses: Rheumatoid arthritis of multiple sites with negative rheumatoid factor: She has no synovitis on  examination today.  She has not had any signs or symptoms of a rheumatoid arthritis flare.  She has clinically been doing well on Humira 40 mg sq injections every 14 days and Arava 20 mg 1 tablet by mouth daily.  She is tolerating combination therapy without any side effects.  She has not had any interruptions in therapy.  No medication changes will be made at this time.  She was advised to notify us if she develops signs or symptoms of a flare.  She will follow-up with the office in 3 months or sooner if needed.  High risk medication use - Humira 40 mg sq injections every 14 days (11/02/2016), Arava 20 mg by mouth daily (12/13/2016) (inadequate response to Enbrel, side effects from MTX).  CBC and CMP WNL on 03/22/23.  Orders for CBC and CMP released today.  Her next lab work will be due at the end of January and every 3 months. TB gold negative on 12/21/22.  No recent or recurrent infections. Discussed the importance of holding humira and arava if she develops signs or symptoms of an infection and to resume once the infection has completely cleared.  - Plan: CBC with Differential/Platelet, COMPLETE METABOLIC PANEL WITH GFR  Fibromyalgia: Patient continues to experience interval myalgias and muscle tenderness due to fibromyalgia.  Patient presents today with discomfort on the lateral SPECT of the left hip consistent with trochanteric bursitis.  She has been experiencing a flare for the past 1 month. She remains under the care of pain management.   Trochanteric bursitis of left hip: Patient presents today with pain on the lateral aspect of the left hip consistent with trochanter bursitis.  Her symptoms started 1 month ago with no identifiable trigger.  She has not had any recent injury or fall.  Her symptoms have been most severe at night when lying on her left side.  She has tried applying ice with minimal to no improvement.  She is not currently experiencing any groin pain.  On examination today she has  good range of motion of both hip joints with tenderness over the left trochanteric bursa and along the IT band.  Different treatment options were discussed including home exercises, physical therapy, and a trochanteric bursa cortisone injection.  Due to the chronicity and severity of her symptoms patient has requested a left trochanteric bursa cortisone injection today.  She tolerated procedure well.  Procedure note was completed above  Aftercare was discussed.  Patient was advised to monitor blood glucose closely following the cortisone injection today. She will notify us if her symptoms persist worsen.   Primary insomnia: She has been experiencing interrupted sleep at night when lying on her left side due to trochanteric bursitis.   Positive ANA (antinuclear antibody) - No clinical features of systemic lupus  Osteopenia of multiple sites - DEXA updated on 12/12/21: AP spine BMD 0.883 with T score -1.5.  Due to update DEXA April 2025.  She remains on vitamin D 50,000 units once weekly.  Hx of fusion of cervical spine - Patient underwent C3-C4 C4-C5 C5-C6 anterior cervical discectomy fusion on 10/06/2022 performed by Dr. Ophelia Charter. Limited ROM with lateral rotation.   Other medical conditions are listed as follows:   Dyslipidemia  History of gastroesophageal reflux (GERD)  History of renal cell carcinoma  History of COPD  History of diabetes mellitus  History of hypothyroidism  Vitamin D deficiency  Elevated hemoglobin A1c: Patient remains on Ozempic.  She uses insulin if needed.  According to the patient her most recent hemoglobin A1c was 5.5%.  Advised patient to monitor blood glucose closely  following the cortisone injection today.  Orders: Orders Placed This Encounter  Procedures   Large Joint Inj   CBC with Differential/Platelet   COMPLETE METABOLIC PANEL WITH GFR   No orders of the defined types were placed in this encounter.   Follow-Up Instructions: Return in about 3 months  (around 09/25/2023) for Rheumatoid arthritis, Fibromyalgia.   Gearldine Bienenstock, PA-C  Note - This record has been created using Dragon software.  Chart creation errors have been sought, but may not always  have been located. Such creation errors do not reflect on  the standard of medical care.

## 2023-06-25 ENCOUNTER — Ambulatory Visit: Payer: 59 | Attending: Physician Assistant | Admitting: Physician Assistant

## 2023-06-25 ENCOUNTER — Encounter: Payer: Self-pay | Admitting: Physician Assistant

## 2023-06-25 VITALS — BP 124/80 | HR 74 | Resp 15 | Ht 61.5 in | Wt 207.6 lb

## 2023-06-25 DIAGNOSIS — M0609 Rheumatoid arthritis without rheumatoid factor, multiple sites: Secondary | ICD-10-CM

## 2023-06-25 DIAGNOSIS — Z79899 Other long term (current) drug therapy: Secondary | ICD-10-CM | POA: Diagnosis not present

## 2023-06-25 DIAGNOSIS — M7062 Trochanteric bursitis, left hip: Secondary | ICD-10-CM | POA: Diagnosis not present

## 2023-06-25 DIAGNOSIS — F5101 Primary insomnia: Secondary | ICD-10-CM

## 2023-06-25 DIAGNOSIS — E785 Hyperlipidemia, unspecified: Secondary | ICD-10-CM

## 2023-06-25 DIAGNOSIS — Z8709 Personal history of other diseases of the respiratory system: Secondary | ICD-10-CM

## 2023-06-25 DIAGNOSIS — Z85528 Personal history of other malignant neoplasm of kidney: Secondary | ICD-10-CM

## 2023-06-25 DIAGNOSIS — M8589 Other specified disorders of bone density and structure, multiple sites: Secondary | ICD-10-CM

## 2023-06-25 DIAGNOSIS — Z8639 Personal history of other endocrine, nutritional and metabolic disease: Secondary | ICD-10-CM

## 2023-06-25 DIAGNOSIS — M7061 Trochanteric bursitis, right hip: Secondary | ICD-10-CM

## 2023-06-25 DIAGNOSIS — E559 Vitamin D deficiency, unspecified: Secondary | ICD-10-CM

## 2023-06-25 DIAGNOSIS — Z981 Arthrodesis status: Secondary | ICD-10-CM

## 2023-06-25 DIAGNOSIS — M797 Fibromyalgia: Secondary | ICD-10-CM

## 2023-06-25 DIAGNOSIS — R768 Other specified abnormal immunological findings in serum: Secondary | ICD-10-CM

## 2023-06-25 DIAGNOSIS — Z8719 Personal history of other diseases of the digestive system: Secondary | ICD-10-CM

## 2023-06-25 DIAGNOSIS — R7309 Other abnormal glucose: Secondary | ICD-10-CM

## 2023-06-25 MED ORDER — LIDOCAINE HCL 1 % IJ SOLN
1.5000 mL | INTRAMUSCULAR | Status: AC | PRN
Start: 2023-06-25 — End: 2023-06-25
  Administered 2023-06-25: 1.5 mL

## 2023-06-25 MED ORDER — TRIAMCINOLONE ACETONIDE 40 MG/ML IJ SUSP
40.0000 mg | INTRAMUSCULAR | Status: AC | PRN
Start: 2023-06-25 — End: 2023-06-25
  Administered 2023-06-25: 40 mg via INTRA_ARTICULAR

## 2023-06-25 NOTE — Progress Notes (Signed)
CBC WNL

## 2023-06-25 NOTE — Patient Instructions (Addendum)
Standing Labs We placed an order today for your standing lab work.   Please have your standing labs drawn in January and every 3 months   Please have your labs drawn 2 weeks prior to your appointment so that the provider can discuss your lab results at your appointment, if possible.  Please note that you may see your imaging and lab results in MyChart before we have reviewed them. We will contact you once all results are reviewed. Please allow our office up to 72 hours to thoroughly review all of the results before contacting the office for clarification of your results.  WALK-IN LAB HOURS  Monday through Thursday from 8:00 am -12:30 pm and 1:00 pm-5:00 pm and Friday from 8:00 am-12:00 pm.  Patients with office visits requiring labs will be seen before walk-in labs.  You may encounter longer than normal wait times. Please allow additional time. Wait times may be shorter on  Monday and Thursday afternoons.  We do not book appointments for walk-in labs. We appreciate your patience and understanding with our staff.   Labs are drawn by [facility]. Please bring your co-pay at the time of your lab draw.  You may receive a bill from [facility] for your lab work.  Please note if you are on Hydroxychloroquine and and an order has been placed for a Hydroxychloroquine level,  you will need to have it drawn 4 hours or more after your last dose.  If you wish to have your labs drawn at another location, please call the office 24 hours in advance so we can fax the orders.  The office is located at [address], [place], [place] [address]   If you have any questions regarding directions or hours of operation,  please call [phone].   As a reminder, please drink plenty of water prior to coming for your lab work. Thanks!    Iliotibial Band Syndrome Rehab Ask your health care provider which exercises are safe for you. Do exercises exactly as told by your provider and adjust them as told. It's  normal to feel mild stretching, pulling, tightness, or discomfort as you do these exercises. Stop right away if you feel sudden pain or your pain gets a lot worse. Do not begin these exercises until told by your provider. Stretching and range-of-motion exercises These exercises warm up your muscles and joints. They also improve the movement and flexibility of your hip and pelvis. Quadriceps stretch, prone  Lie face down (prone) on a firm surface like a bed or padded floor. Bend your left / right knee. Reach back to hold your ankle or pant leg. If you can't reach your ankle or pant leg, use a belt looped around your foot and grab the belt instead. Gently pull your heel toward your butt. Your knee should not slide out to the side. You should feel a stretch in the front of your thigh and knee, also called the quadriceps. Hold this position for __________ seconds. Repeat __________ times. Complete this exercise __________ times a day. Iliotibial band stretch The iliotibial band is a strip of tissue that runs along the outside of your hip down to your knee. Lie on your side with your left / right leg on top. Bend both knees and grab your left / right ankle. Stretch out your bottom arm to help you balance. Slowly bring your top knee back so your thigh goes behind your back. Slowly lower your top leg toward the floor until you feel  a gentle stretch on the outside of your left / right hip and thigh. If you don't feel a stretch and your knee won't go farther, place the heel of your other foot on top of your knee and pull your knee down toward the floor with your foot. Hold this position for __________ seconds. Repeat __________ times. Complete this exercise __________ times a day. Strengthening exercises These exercises build strength and endurance in your hip and pelvis. Endurance means your muscles can keep working even when they're tired. Straight leg raises, side-lying This exercise strengthens the  muscles that rotate the leg at the hip and move it away from your body. These muscles are called hip abductors. Lie on your side with your left / right leg on top. Lie so your head, shoulder, hip, and knee line up. You can bend your bottom knee to help you balance. Roll your hips slightly forward so they're stacked directly over each other. Your left / right knee should face forward. Tense the muscles in your outer thigh and hip. Lift your top leg 4-6 inches (10-15 cm) off the ground. Hold this position for __________ seconds. Slowly lower your leg back down to the starting position. Let your muscles fully relax before doing this exercise again. Repeat __________ times. Complete this exercise __________ times a day. Leg raises, prone This exercise strengthens the muscles that move the hips backward. These muscles are called hip extensors. Lie face down (prone) on your bed or a firm surface. You can put a pillow under your hips for comfort and to support your lower back. Bend your left / right knee so your foot points straight up toward the ceiling. Keep the other leg straight and behind you. Squeeze your butt muscles. Lift your left / right thigh off the firm surface. Do not let your back arch. Tense your thigh muscle as hard as you can without having more knee pain. Hold this position for __________ seconds. Slowly lower your leg to the starting position. Allow your leg to relax all the way. Repeat __________ times. Complete this exercise __________ times a day. Hip hike  Stand sideways on a bottom step. Place your feet so that your left / right leg is on the step, and the other foot is hanging off the side. If you need support for balance, hold onto a railing or wall. Keep your knees straight and your abdomen square, meaning your hips are level. Then, lift your left / right hip up toward the ceiling. Slowly let your leg that's hanging off the step lower towards the floor. Your foot should get  closer to the ground. Do not lean or bend your knees during this movement. Repeat __________ times. Complete this exercise __________ times a day. This information is not intended to replace advice given to you by your health care provider. Make sure you discuss any questions you have with your health care provider. Document Revised: 10/27/2022 Document Reviewed: 10/27/2022 Elsevier Patient Education  2024 [employer]   Hip Bursitis Rehab Ask your health care provider which exercises are safe for you. Do exercises exactly as told by your health care provider and adjust them as directed. It is normal to feel mild stretching, pulling, tightness, or discomfort as you do these exercises. Stop right away if you feel sudden pain or your pain gets worse. Do not begin these exercises until told by your health care provider. Stretching exercise This exercise warms up your muscles and joints and improves the movement  and flexibility of your hip. This exercise also helps to relieve pain and stiffness. Iliotibial band stretch An iliotibial band is a strong band of muscle tissue that runs from the outer side of your hip to the outer side of your thigh and knee. Lie on your side with your left / right leg in the top position. Bend your left / right knee and grab your ankle. Stretch out your bottom arm to help you balance. Slowly bring your knee back so your thigh is slightly behind your body. Slowly lower your knee toward the floor until you feel a gentle stretch on the outside of your left / right thigh. If you do not feel a stretch and your knee will not lower more toward the floor, place the heel of your other foot on top of your knee and pull your knee down toward the floor with your foot. Hold this position for __________ seconds. Slowly return to the starting position. Repeat __________ times. Complete this exercise __________ times a day. Strengthening exercises These exercises build strength and  endurance in your hip and pelvis. Endurance is the ability to use your muscles for a long time, even after they get tired. Bridge This exercise strengthens the muscles that move your thigh backward (hip extensors). Lie on your back on a firm surface with your knees bent and your feet flat on the floor. Tighten your buttocks muscles and lift your buttocks off the floor until your trunk is level with your thighs. Do not arch your back. You should feel the muscles working in your buttocks and the back of your thighs. If you do not feel these muscles, slide your feet 1-2 inches (2.5-5 cm) farther away from your buttocks. If this exercise is too easy, try doing it with your arms crossed over your chest. Hold this position for __________ seconds. Slowly lower your hips to the starting position. Let your muscles relax completely after each repetition. Repeat __________ times. Complete this exercise __________ times a day. Squats This exercise strengthens the muscles in front of your thigh and knee (quadriceps). Stand in front of a table, with your feet and knees pointing straight ahead. You may rest your hands on the table for balance but not for support. Slowly bend your knees and lower your hips like you are going to sit in a chair. Keep your weight over your heels, not over your toes. Keep your lower legs upright so they are parallel with the table legs. Do not let your hips go lower than your knees. Do not bend lower than told by your health care provider. If your hip pain increases, do not bend as low. Hold the squat position for __________ seconds. Slowly push with your legs to return to standing. Do not use your hands to pull yourself to standing. Repeat __________ times. Complete this exercise __________ times a day. Hip hike  Stand sideways on a bottom step. Stand on your left / right leg with your other foot unsupported next to the step. You can hold on to the railing or wall for balance  if needed. Keep your knees straight and your torso square. Then lift your left / right hip up toward the ceiling. Hold this position for __________ seconds. Slowly let your left / right hip lower toward the floor, past the starting position. Your foot should get closer to the floor. Do not lean or bend your knees. Repeat __________ times. Complete this exercise __________ times a day. Single leg stand  This exercise increases your balance. Without shoes, stand near a railing or in a doorway. You may hold on to the railing or door frame as needed for balance. Squeeze your left / right buttock muscles, then lift up your other foot. Do not let your left / right hip push out to the side. It is helpful to stand in front of a mirror for this exercise so you can watch your hip. Hold this position for __________ seconds. Repeat __________ times. Complete this exercise __________ times a day. This information is not intended to replace advice given to you by your health care provider. Make sure you discuss any questions you have with your health care provider. Document Revised: 07/27/2021 Document Reviewed: 07/27/2021 Elsevier Patient Education  2024 [employer].

## 2023-06-26 LAB — CBC WITH DIFFERENTIAL/PLATELET
Absolute Lymphocytes: 2388 {cells}/uL (ref 850–3900)
Absolute Monocytes: 822 {cells}/uL (ref 200–950)
Basophils Absolute: 48 {cells}/uL (ref 0–200)
Basophils Relative: 0.8 %
Eosinophils Absolute: 72 {cells}/uL (ref 15–500)
Eosinophils Relative: 1.2 %
HCT: 39.7 % (ref 35.0–45.0)
Hemoglobin: 12.5 g/dL (ref 11.7–15.5)
MCH: 27.7 pg (ref 27.0–33.0)
MCHC: 31.5 g/dL — ABNORMAL LOW (ref 32.0–36.0)
MCV: 88 fL (ref 80.0–100.0)
MPV: 10.4 fL (ref 7.5–12.5)
Monocytes Relative: 13.7 %
Neutro Abs: 2670 {cells}/uL (ref 1500–7800)
Neutrophils Relative %: 44.5 %
Platelets: 245 10*3/uL (ref 140–400)
RBC: 4.51 10*6/uL (ref 3.80–5.10)
RDW: 13.9 % (ref 11.0–15.0)
Total Lymphocyte: 39.8 %
WBC: 6 10*3/uL (ref 3.8–10.8)

## 2023-06-26 LAB — COMPLETE METABOLIC PANEL WITH GFR
AG Ratio: 1.2 (calc) (ref 1.0–2.5)
ALT: 13 U/L (ref 6–29)
AST: 12 U/L (ref 10–35)
Albumin: 3.8 g/dL (ref 3.6–5.1)
Alkaline phosphatase (APISO): 123 U/L (ref 37–153)
BUN/Creatinine Ratio: 8 (calc) (ref 6–22)
BUN: 8 mg/dL (ref 7–25)
CO2: 24 mmol/L (ref 20–32)
Calcium: 8.6 mg/dL (ref 8.6–10.4)
Chloride: 105 mmol/L (ref 98–110)
Creat: 1.05 mg/dL — ABNORMAL HIGH (ref 0.50–1.03)
Globulin: 3.1 g/dL (ref 1.9–3.7)
Glucose, Bld: 159 mg/dL — ABNORMAL HIGH (ref 65–99)
Potassium: 4.3 mmol/L (ref 3.5–5.3)
Sodium: 138 mmol/L (ref 135–146)
Total Bilirubin: 0.4 mg/dL (ref 0.2–1.2)
Total Protein: 6.9 g/dL (ref 6.1–8.1)
eGFR: 61 mL/min/{1.73_m2} (ref >=60)

## 2023-06-26 NOTE — Progress Notes (Signed)
Glucose is 159.  Creatinine is borderline elevated-1.05--avoid the use of NSAIDs and increase water intake. Rest of CMP WNL

## 2023-06-27 ENCOUNTER — Ambulatory Visit (INDEPENDENT_AMBULATORY_CARE_PROVIDER_SITE_OTHER): Payer: 59 | Admitting: *Deleted

## 2023-06-27 DIAGNOSIS — J4550 Severe persistent asthma, uncomplicated: Secondary | ICD-10-CM

## 2023-06-28 ENCOUNTER — Encounter: Payer: Self-pay | Admitting: *Deleted

## 2023-07-03 ENCOUNTER — Other Ambulatory Visit (HOSPITAL_COMMUNITY): Payer: Self-pay | Admitting: Pharmacy Technician

## 2023-07-03 ENCOUNTER — Other Ambulatory Visit (HOSPITAL_COMMUNITY): Payer: Self-pay

## 2023-07-03 ENCOUNTER — Other Ambulatory Visit: Payer: Self-pay

## 2023-07-03 NOTE — Progress Notes (Signed)
Specialty Pharmacy Refill Coordination Note  Kimberly Jenkins is a 57 y.o. female contacted today regarding refills of specialty medication(s) Adalimumab   Patient requested Delivery   Delivery date: 07/12/23   Verified address: [address] [place]   Medication will be filled on 07/11/23.

## 2023-07-25 ENCOUNTER — Ambulatory Visit (INDEPENDENT_AMBULATORY_CARE_PROVIDER_SITE_OTHER): Payer: 59 | Admitting: *Deleted

## 2023-07-25 DIAGNOSIS — J4550 Severe persistent asthma, uncomplicated: Secondary | ICD-10-CM | POA: Diagnosis not present

## 2023-07-28 ENCOUNTER — Other Ambulatory Visit: Payer: Self-pay | Admitting: Allergy and Immunology

## 2023-08-06 ENCOUNTER — Other Ambulatory Visit: Payer: Self-pay | Admitting: Physician Assistant

## 2023-08-06 ENCOUNTER — Other Ambulatory Visit: Payer: Self-pay

## 2023-08-06 MED ORDER — HUMIRA (2 PEN) 40 MG/0.8ML ~~LOC~~ AJKT
AUTO-INJECTOR | SUBCUTANEOUS | 0 refills | Status: DC
  Filled 2023-08-06: qty 2, 28d supply, fill #0
  Filled 2023-09-11: qty 2, 28d supply, fill #1
  Filled 2023-10-17: qty 2, 28d supply, fill #2

## 2023-08-06 NOTE — Progress Notes (Signed)
Specialty Pharmacy Refill Coordination Note  Kimberly Jenkins is a 57 y.o. female contacted today regarding refills of specialty medication(s) Adalimumab   Patient requested Delivery   Delivery date: 08/16/23   Verified address: [address] [place] [address]   Medication will be filled on 08/15/23.   *pending refill request-- call if any delays*

## 2023-08-06 NOTE — Telephone Encounter (Signed)
Last Fill: 04/27/2023  Labs: 06/25/2023 Glucose is 159. Creatinine is borderline elevated-1.05 Rest of CMP WNL   TB Gold: 12/21/2022 Neg    Next Visit: 09/25/2023  Last Visit: 06/25/2023  DX: Rheumatoid arthritis of multiple sites with negative rheumatoid factor   Current Dose per office note 06/25/2023: Humira 40 mg sq injections every 14 days   Okay to refill Humira?

## 2023-08-15 ENCOUNTER — Other Ambulatory Visit: Payer: Self-pay

## 2023-08-20 ENCOUNTER — Ambulatory Visit (INDEPENDENT_AMBULATORY_CARE_PROVIDER_SITE_OTHER): Payer: 59 | Admitting: *Deleted

## 2023-08-20 DIAGNOSIS — J4550 Severe persistent asthma, uncomplicated: Secondary | ICD-10-CM

## 2023-09-01 ENCOUNTER — Other Ambulatory Visit: Payer: Self-pay | Admitting: Physician Assistant

## 2023-09-03 NOTE — Telephone Encounter (Signed)
 Last Fill: 03/29/2023  Labs: 06/25/2023 Glucose is 159. Creatinine is borderline elevated-1.05 Rest of CMP WNL   Next Visit: 09/25/2023  Last Visit: 06/25/2023  DX:  Rheumatoid arthritis of multiple sites with negative rheumatoid factor   Current Dose per office note 06/25/2023: Arava  20 mg by mouth daily   Okay to refill Arava  ?

## 2023-09-11 ENCOUNTER — Other Ambulatory Visit: Payer: Self-pay

## 2023-09-11 NOTE — Progress Notes (Signed)
 Specialty Pharmacy Refill Coordination Note  Kimberly Jenkins is a 58 y.o. female contacted today regarding refills of specialty medication(s) Adalimumab  (Humira  (2 Pen))   Patient requested Delivery   Delivery date: 09/19/23   Verified address: [address] [place] [address]   Medication will be filled on 01.21.25.

## 2023-09-11 NOTE — Progress Notes (Unsigned)
Office Visit Note  Patient: Kimberly Jenkins             Date of Birth: Apr 03, 1966           MRN: [id number]             PCP: Grayland Jack, FNP Referring: Grayland Jack, * Visit Date: 09/25/2023 Occupation: @GUAROCC @  Subjective:  Medication monitoring   History of Present Illness: Kimberly Jenkins is a 58 y.o. female with history of seronegative rheumatoid arthritis and osteoarthritis.  Patient remains on Humira 40 mg sq injections every 14 days (11/02/2016) and Arava 20 mg by mouth daily (12/13/2016).  She is tolerating combination p.o. without any side effects.  Patient reports that she was due for her last Humira injection on Friday but forgot to administer dose that she plans on administering today.  Patient states that she is having some increased tenderness and stiffness in her hands since she is overdue for her Humira injection.  Overall she continues to find Humira to be effective at managing her symptoms.  She denies any joint swelling at this time.  She denies any upcoming surgeries.  She presents today with some increased discomfort on the lateral aspect of both hips.  She denies any groin pain currently. She denies any recurrent infections.    Activities of Daily Living:  Patient reports morning stiffness for 1 hour.   Patient Denies nocturnal pain.  Difficulty dressing/grooming: Reports Difficulty climbing stairs: Reports Difficulty getting out of chair: Reports Difficulty using hands for taps, buttons, cutlery, and/or writing: Reports  Review of Systems  Constitutional:  Positive for fatigue.  HENT:  Positive for mouth sores and mouth dryness. Negative for nose dryness.        Nose sore  Eyes:  Negative for pain and dryness.  Respiratory:  Negative for difficulty breathing.   Cardiovascular:  Negative for palpitations.  Gastrointestinal:  Positive for constipation. Negative for blood in stool and diarrhea.  Endocrine: Negative for increased  urination.  Genitourinary:  Negative for painful urination and involuntary urination.  Musculoskeletal:  Positive for joint pain, gait problem, joint pain, myalgias, morning stiffness, muscle tenderness and myalgias. Negative for joint swelling and muscle weakness.  Skin:  Negative for color change, rash, hair loss and sensitivity to sunlight.  Allergic/Immunologic: Positive for susceptible to infections.  Neurological:  Positive for numbness and headaches. Negative for dizziness.  Hematological:  Negative for swollen glands.  Psychiatric/Behavioral:  Positive for depressed mood and sleep disturbance. The patient is nervous/anxious.     PMFS History:  Patient Active Problem List   Diagnosis Date Noted   Protrusion of cervical intervertebral disc 07/19/2022   Herniation of cervical intervertebral disc with radiculopathy 07/19/2022   Other spondylosis with radiculopathy, cervical region 07/04/2022   Cancer of kidney (HCC) 07/11/2021   Dehydration 07/11/2021   Kidney disease 07/11/2021   Hypokalemia 05/11/2020   Costochondritis 05/10/2020   Edema 05/10/2020   Epistaxis 05/10/2020   Fibroma 05/10/2020   Hypercholesterolemia 05/10/2020   Iron deficiency anemia secondary to blood loss (chronic) 05/10/2020   Migraine, unspecified, not intractable, without status migrainosus 05/10/2020   Other chronic diseases of tonsils and adenoids 05/10/2020   Hypertrophy of tonsils 05/10/2020   Asthma 05/07/2020   Fluid overload 05/07/2020   Pain in right knee 08/28/2019   Carpal tunnel syndrome of right wrist 04/17/2019   Ulnar tunnel syndrome of right wrist 04/17/2019   Lesion of ulnar nerve, right upper limb 11/12/2018  Current mild episode of major depressive disorder without prior episode (HCC) 12/12/2017   Abnormal weight loss 11/15/2017   Fibromyalgia 08/27/2016   History of right rotator cuff tear repair 08/27/2016   Diabetes 08/27/2016   Chronic obstructive pulmonary disease (HCC)  08/27/2016   Dyslipidemia 08/27/2016   History of renal cell carcinoma 08/27/2016   Anxiety 08/27/2016   Sleep apnea 08/27/2016   Rheumatoid arthritis of multiple sites with negative rheumatoid factor  07/18/2016   High risk medication use 07/18/2016   Other specified personal risk factors, not elsewhere classified 07/18/2016   Hand pain 07/13/2016   Foot pain 07/13/2016   Elevated sed rate 07/13/2016   Open wound of toe 05/03/2016   Ataxic gait 05/02/2016   Risk for falls 05/02/2016   Tremor 05/02/2016   Vitamin D deficiency 07/08/2015   Chest pain, unspecified 06/23/2015   Mood disorder (HCC) 06/17/2015   Essential hypertension 05/18/2015   Generalized anxiety disorder 05/12/2015   Acquired hypothyroidism 05/05/2015   Severe persistent asthma 04/30/2015   Other allergic rhinitis 04/30/2015   GERD (gastroesophageal reflux disease) 04/30/2015   Chronic kidney disease, stage 2 (mild) 03/02/2015   Obesity 03/01/2015   Chronic post-traumatic headache 02/17/2010   Depression, unspecified 02/17/2010   History of malignant neoplasm of kidney 02/17/2010   Hyperlipidemia associated with type 2 diabetes mellitus (HCC) 02/17/2010   Other amnesia 02/17/2010   Polyosteoarthritis, unspecified 02/17/2010   Sleep disturbance 02/17/2010   Spinal stenosis in cervical region 02/17/2010    Past Medical History:  Diagnosis Date   Abnormal weight loss 11/15/2017   Acquired hypothyroidism 05/05/2015   ADHD (attention deficit hyperactivity disorder)    Allergic rhinitis    Anxiety 08/27/2016   With depression   Asthma    Ataxic gait 05/02/2016   Cancer of kidney [facility])    Carpal tunnel syndrome of right wrist 04/17/2019   Chest pain, unspecified 06/23/2015   Formatting of this note might be different from the original. Overview:  Normal pharmacologic MPS Formatting of this note might be different from the original. Normal pharmacologic MPS Formatting of this note might be different from the  original. Formatting of this note might be different from the original. Normal pharmacologic MPS Formatting of this note might be different from the original. Form   Chronic kidney disease (CKD), stage II (mild)    Chronic obstructive pulmonary disease (HCC) 08/27/2016   Chronic post-traumatic headache 02/17/2010   Current mild episode of major depressive disorder without prior episode (HCC) 12/12/2017   Dehydration    Depression, unspecified 02/17/2010   Diabetes 08/27/2016   Dyslipidemia 08/27/2016   Edema 05/10/2020   Elevated sed rate 07/13/2016   48 on 05/2016   Epistaxis 05/10/2020   Essential hypertension 05/18/2015   Fatty liver    Fibroma 05/10/2020   Fibromyalgia 08/27/2016   First degree AV block    Fluid overload 05/07/2020   Foot pain 07/13/2016   Generalized anxiety disorder 05/12/2015   GERD (gastroesophageal reflux disease)    Hand pain 07/13/2016   High risk medication use 07/18/2016   History of hiatal hernia    History of malignant neoplasm of kidney 02/17/2010   Formatting of this note might be different from the original. Formatting of this note might be different from the original. Right partial nephrectomy 2008 Formatting of this note might be different from the original. Overview:  Right partial nephrectomy 2008   History of renal cell carcinoma 08/27/2016   Right partial nephrectomy 2008  Hypercholesterolemia 05/10/2020   Formatting of this note might be different from the original. Formatting of this note might be different from the original. 12/07/2014 - TC 151/Trig 199/HDL 47/LDL 64  Lp(a) 10 ( <69)   Hyperlipidemia associated with type 2 diabetes mellitus (HCC) 02/17/2010   Formatting of this note might be different from the original. Overview:  12/07/2014 - TC 151/Trig 199/HDL 47/LDL 64  Lp(a) 10 ( <62)  Overview:  12/07/2014 - TC 151/Trig 199/HDL 47/LDL 64  Lp(a) 10 ( <95) Formatting of this note might be different from the original. 12/07/2014 - TC  151/Trig 199/HDL 47/LDL 64  Lp(a) 10 ( <28)   Hypertrophy of tonsils 05/10/2020   Hypokalemia 05/11/2020   Hypothyroidism    Iron deficiency anemia secondary to blood loss (chronic) 05/10/2020   Kidney disease    Migraine, unspecified, not intractable, without status migrainosus 05/10/2020   Mood disorder (HCC) 06/17/2015   Obesity 03/01/2015   Open wound of toe 05/03/2016   Osteoarthritis    Other allergic rhinitis 04/30/2015   Other amnesia 02/17/2010   Other chronic diseases of tonsils and adenoids 05/10/2020   Other specified personal risk factors, not elsewhere classified 07/18/2016   Pain in right knee 08/28/2019   Rheumatoid arthritis (HCC)    Risk for falls 05/02/2016   Severe persistent asthma 04/30/2015   Sleep apnea 08/27/2016   On CPAP, 2 L oxygen   Sleep disturbance 02/17/2010   Spinal stenosis in cervical region 02/17/2010   Tremor    Vitamin D deficiency 07/08/2015    Family History  Problem Relation Age of Onset   Asthma Mother    Lung cancer Mother    Asthma Father    Allergic rhinitis Father    COPD Father    Heart attack Father    Arthritis Father    Brain cancer Paternal Uncle    Asthma Maternal Grandmother    Brain cancer Paternal Grandmother    Breast cancer Cousin    Breast cancer Cousin    Breast cancer Cousin    Brain cancer Brother    Heart attack Brother    Past Surgical History:  Procedure Laterality Date   ANTERIOR CERVICAL DECOMP/DISCECTOMY FUSION N/A 10/06/2022   Procedure: C3-4, C4-5, C5-6 ANTERIOR CERVICAL DISCECTOMY FUSION, ALLOGRAFT, PLATE;  Surgeon: Eldred Manges, MD;  Location: [facility] OR;  Service: Orthopedics;  Laterality: N/A;  Needs RNFA   APPENDECTOMY  1983   CESAREAN SECTION     2 times, 1986 & 1983   CHOLECYSTECTOMY  2009   COLONOSCOPY W/ POLYPECTOMY  2021   ELBOW SURGERY Right    ulner nerve release   ESOPHAGEAL DILATION  2021   KIDNEY SURGERY Right 2008   1/2 right kidney removed   pellet insertion  03/2021   per  patient   SHOULDER SURGERY Right 2006   TONSILLECTOMY  2011   TOTAL SHOULDER ARTHROPLASTY Right    TUBAL LIGATION  1986   WRIST SURGERY Right    ulner nerve release   Social History   Social History Narrative   Not on file   Immunization History  Administered Date(s) Administered   PFIZER(Purple Top)SARS-COV-2 Vaccination 11/18/2019, 12/09/2019, 08/05/2020     Objective: Vital Signs: BP 134/84 (BP Location: Left Arm, Patient Position: Sitting, Cuff Size: Normal)   Pulse 88   Resp 14   Ht 5' 1.5" (1.562 m)   Wt 192 lb 3.2 oz (87.2 kg)   BMI 35.73 kg/m    Physical Exam Vitals  and nursing note reviewed.  Constitutional:      Appearance: She is well-developed.  HENT:     Head: Normocephalic and atraumatic.  Eyes:     Conjunctiva/sclera: Conjunctivae normal.  Cardiovascular:     Rate and Rhythm: Normal rate and regular rhythm.     Heart sounds: Normal heart sounds.  Pulmonary:     Effort: Pulmonary effort is normal.     Breath sounds: Normal breath sounds.  Abdominal:     General: Bowel sounds are normal.     Palpations: Abdomen is soft.  Musculoskeletal:     Cervical back: Normal range of motion.  Skin:    General: Skin is warm and dry.     Capillary Refill: Capillary refill takes less than 2 seconds.  Neurological:     Mental Status: She is alert and oriented to person, place, and time.  Psychiatric:        Behavior: Behavior normal.      Musculoskeletal Exam: C-spine has limited range of motion with lateral rotation.  Shoulder joints have slightly limited range of motion with internal rotation.  Elbow joints, wrist joints, MCPs, PIPs, DIPs have good range of motion with no synovitis.  Complete fist formation bilaterally.  PIP and DIP thickening noted.  Hip joints have good range of motion with no groin pain.  Tenderness over bilateral trochanteric bursa.  Knee joints have good range of motion no warmth or effusion.  Ankle joints have good range of motion with no  tenderness or joint swelling.  CDAI Exam: CDAI Score: -- Patient Global: 30 / 100; Provider Global: 30 / 100 Swollen: --; Tender: -- Joint Exam 09/25/2023   No joint exam has been documented for this visit   There is currently no information documented on the homunculus. Go to the Rheumatology activity and complete the homunculus joint exam.  Investigation: No additional findings.  Imaging: No results found.  Recent Labs: Lab Results  Component Value Date   WBC 6.0 06/25/2023   HGB 12.5 06/25/2023   PLT 245 06/25/2023   NA 138 06/25/2023   K 4.3 06/25/2023   CL 105 06/25/2023   CO2 24 06/25/2023   GLUCOSE 159 (H) 06/25/2023   BUN 8 06/25/2023   CREATININE 1.05 (H) 06/25/2023   BILITOT 0.4 06/25/2023   ALKPHOS 81 10/04/2022   AST 12 06/25/2023   ALT 13 06/25/2023   PROT 6.9 06/25/2023   ALBUMIN 3.3 (L) 10/04/2022   CALCIUM 8.6 06/25/2023   GFRAA 79 11/03/2020   QFTBGOLD NEGATIVE 06/26/2017   QFTBGOLDPLUS NEGATIVE 12/21/2022    Speciality Comments: No specialty comments available.  Procedures:  No procedures performed Allergies: Methotrexate, Advair diskus [fluticasone-salmeterol], Aspirin, Ciprofloxacin, Cephalexin, and Clonidine derivatives     Assessment / Plan:     Visit Diagnoses: Rheumatoid arthritis of multiple sites with negative rheumatoid factor: She has no synovitis on examination today.  She has not had any signs or symptoms of a rheumatoid arthritis flare.  She has clinically been doing well on Humira 40 mg subcutaneous injections every 14 days and Arava 20 mg daily.  She is tolerating combination therapy without any side effects.  Patient was due for her last injection of Humira on Friday but forgot to administer the dose that she plans on taking Humira today.  She has noticed some increased tenderness and stiffness in her hands since she is overdue for her Humira injection.  No active inflammation was noted.  No medication changes will be made at this  time.  She was advised to notify us if she develops signs or symptoms of a flare.  She will follow-up in the office in 5 months or sooner if needed.  High risk medication use - Humira 40 mg sq injections every 14 days (11/02/2016), Arava 20 mg by mouth daily (12/13/2016) (inadequate response to Enbrel, side effects from MTX).  CBC and CMP updated on 06/25/23. Orders for CBC and CMP released today.  Her next lab work will be due in April and every 3 months to monitor for drug toxicity. TB gold negative on 12/21/22.  Future order for TB gold placed today. No recent or recurrent infections.  Discussed the importance of holding humira and arava if she develops signs or symptoms of an infection and to resume once the infection has completely cleared.  - Plan: CBC with Differential/Platelet, COMPLETE METABOLIC PANEL WITH GFR, QuantiFERON-TB Gold Plus  Screening for tuberculosis -Future order for TB gold placed today.  Plan: QuantiFERON-TB Gold Plus  Fibromyalgia: Patient continues to experience intermittent myalgias and muscle tenderness due to fibromyalgia.  She takes Flexeril 10 mg at bedtime for insomnia and muscle spasms.  Discussed the importance of regular exercise and good sleep hygiene.  Trochanteric bursitis of both hips: Patient presents today with increased discomfort on the lateral aspect of both hips consistent with trochanteric bursitis.  Encouraged the patient to perform stretching exercises on a daily basis.  Primary insomnia: She takes Flexeril 10 mg at bedtime.  Positive ANA (antinuclear antibody): No clinical features of systemic lupus.   Osteopenia of multiple sites - DEXA updated on 12/12/21: AP spine BMD 0.883 with T score -1.5.  She is taking vitamin D 50,000 units once weekly.  Due to update DEXA April 2025.  Hx of fusion of cervical spine - Underwent C3-C4 C4-C5 C5-C6 anterior cervical discectomy fusion on 10/06/2022 performed by Dr. Ophelia Charter. Limited ROM with lateral rotation.    Other medical conditions are listed as follows:   Dyslipidemia  History of gastroesophageal reflux (GERD)  History of renal cell carcinoma  History of COPD  History of diabetes mellitus  History of hypothyroidism  Vitamin D deficiency  Elevated hemoglobin A1c  Trochanteric bursitis of both hips  Chronic obstructive pulmonary disease, unspecified COPD type (HCC)   Orders: Orders Placed This Encounter  Procedures   CBC with Differential/Platelet   COMPLETE METABOLIC PANEL WITH GFR   QuantiFERON-TB Gold Plus   No orders of the defined types were placed in this encounter.     Follow-Up Instructions: Return in about 5 months (around 02/23/2024) for Rheumatoid arthritis, Osteoarthritis.   Gearldine Bienenstock, PA-C  Note - This record has been created using Dragon software.  Chart creation errors have been sought, but may not always  have been located. Such creation errors do not reflect on  the standard of medical care.

## 2023-09-17 ENCOUNTER — Ambulatory Visit: Payer: 59

## 2023-09-18 ENCOUNTER — Other Ambulatory Visit: Payer: Self-pay

## 2023-09-25 ENCOUNTER — Ambulatory Visit: Payer: 59 | Attending: Physician Assistant | Admitting: Physician Assistant

## 2023-09-25 ENCOUNTER — Encounter: Payer: Self-pay | Admitting: Physician Assistant

## 2023-09-25 ENCOUNTER — Other Ambulatory Visit: Payer: Self-pay

## 2023-09-25 VITALS — BP 134/84 | HR 88 | Resp 14 | Ht 61.5 in | Wt 192.2 lb

## 2023-09-25 DIAGNOSIS — Z111 Encounter for screening for respiratory tuberculosis: Secondary | ICD-10-CM

## 2023-09-25 DIAGNOSIS — M7062 Trochanteric bursitis, left hip: Secondary | ICD-10-CM | POA: Diagnosis not present

## 2023-09-25 DIAGNOSIS — Z8719 Personal history of other diseases of the digestive system: Secondary | ICD-10-CM

## 2023-09-25 DIAGNOSIS — M0609 Rheumatoid arthritis without rheumatoid factor, multiple sites: Secondary | ICD-10-CM | POA: Diagnosis not present

## 2023-09-25 DIAGNOSIS — Z79899 Other long term (current) drug therapy: Secondary | ICD-10-CM | POA: Diagnosis not present

## 2023-09-25 DIAGNOSIS — M8589 Other specified disorders of bone density and structure, multiple sites: Secondary | ICD-10-CM

## 2023-09-25 DIAGNOSIS — R7309 Other abnormal glucose: Secondary | ICD-10-CM

## 2023-09-25 DIAGNOSIS — M797 Fibromyalgia: Secondary | ICD-10-CM | POA: Diagnosis not present

## 2023-09-25 DIAGNOSIS — Z981 Arthrodesis status: Secondary | ICD-10-CM

## 2023-09-25 DIAGNOSIS — Z8709 Personal history of other diseases of the respiratory system: Secondary | ICD-10-CM

## 2023-09-25 DIAGNOSIS — Z8639 Personal history of other endocrine, nutritional and metabolic disease: Secondary | ICD-10-CM

## 2023-09-25 DIAGNOSIS — E559 Vitamin D deficiency, unspecified: Secondary | ICD-10-CM

## 2023-09-25 DIAGNOSIS — F5101 Primary insomnia: Secondary | ICD-10-CM

## 2023-09-25 DIAGNOSIS — J449 Chronic obstructive pulmonary disease, unspecified: Secondary | ICD-10-CM

## 2023-09-25 DIAGNOSIS — R768 Other specified abnormal immunological findings in serum: Secondary | ICD-10-CM

## 2023-09-25 DIAGNOSIS — Z85528 Personal history of other malignant neoplasm of kidney: Secondary | ICD-10-CM

## 2023-09-25 DIAGNOSIS — M7061 Trochanteric bursitis, right hip: Secondary | ICD-10-CM

## 2023-09-25 DIAGNOSIS — E785 Hyperlipidemia, unspecified: Secondary | ICD-10-CM

## 2023-09-25 MED ORDER — LEFLUNOMIDE 20 MG PO TABS: 20.0000 mg | ORAL_TABLET | Freq: Every day | ORAL | 0 refills | Status: AC

## 2023-09-25 NOTE — Progress Notes (Signed)
CBC WNL

## 2023-09-25 NOTE — Telephone Encounter (Signed)
Please review and sign pended "no print" rx to reflect arava 20mg  1 tablet by mouth daily. Ranae Plumber was last refilled and dispensed on 09/03/2023 and for next refill, we can refill the 20mg  tablets. Thanks!

## 2023-09-26 ENCOUNTER — Other Ambulatory Visit: Payer: Self-pay | Admitting: Allergy and Immunology

## 2023-09-26 LAB — CBC WITH DIFFERENTIAL/PLATELET
Absolute Lymphocytes: 1532 {cells}/uL (ref 850–3900)
Absolute Monocytes: 653 {cells}/uL (ref 200–950)
Basophils Absolute: 41 {cells}/uL (ref 0–200)
Basophils Relative: 0.9 %
Eosinophils Absolute: 41 {cells}/uL (ref 15–500)
Eosinophils Relative: 0.9 %
HCT: 41.8 % (ref 35.0–45.0)
Hemoglobin: 12.9 g/dL (ref 11.7–15.5)
MCH: 27.2 pg (ref 27.0–33.0)
MCHC: 30.9 g/dL — ABNORMAL LOW (ref 32.0–36.0)
MCV: 88 fL (ref 80.0–100.0)
MPV: 10.5 fL (ref 7.5–12.5)
Monocytes Relative: 14.2 %
Neutro Abs: 2332 {cells}/uL (ref 1500–7800)
Neutrophils Relative %: 50.7 %
Platelets: 250 10*3/uL (ref 140–400)
RBC: 4.75 10*6/uL (ref 3.80–5.10)
RDW: 14.3 % (ref 11.0–15.0)
Total Lymphocyte: 33.3 %
WBC: 4.6 10*3/uL (ref 3.8–10.8)

## 2023-09-26 LAB — COMPLETE METABOLIC PANEL WITH GFR
AG Ratio: 1.4 (calc) (ref 1.0–2.5)
ALT: 14 U/L (ref 6–29)
AST: 16 U/L (ref 10–35)
Albumin: 4 g/dL (ref 3.6–5.1)
Alkaline phosphatase (APISO): 91 U/L (ref 37–153)
BUN/Creatinine Ratio: 10 (calc) (ref 6–22)
BUN: 10 mg/dL (ref 7–25)
CO2: 24 mmol/L (ref 20–32)
Calcium: 8.9 mg/dL (ref 8.6–10.4)
Chloride: 108 mmol/L (ref 98–110)
Creat: 1.05 mg/dL — ABNORMAL HIGH (ref 0.50–1.03)
Globulin: 2.8 g/dL (ref 1.9–3.7)
Glucose, Bld: 111 mg/dL — ABNORMAL HIGH (ref 65–99)
Potassium: 4.3 mmol/L (ref 3.5–5.3)
Sodium: 139 mmol/L (ref 135–146)
Total Bilirubin: 0.4 mg/dL (ref 0.2–1.2)
Total Protein: 6.8 g/dL (ref 6.1–8.1)
eGFR: 61 mL/min/{1.73_m2} (ref >=60)

## 2023-09-26 NOTE — Progress Notes (Signed)
Glucose is 111. Creatinine is borderline elevated but stable-1.05. rest of CMP WNL

## 2023-09-27 ENCOUNTER — Other Ambulatory Visit: Payer: Self-pay | Admitting: Allergy and Immunology

## 2023-09-27 ENCOUNTER — Encounter: Payer: Self-pay | Admitting: *Deleted

## 2023-10-17 ENCOUNTER — Other Ambulatory Visit: Payer: Self-pay

## 2023-10-17 ENCOUNTER — Other Ambulatory Visit: Payer: Self-pay | Admitting: Pharmacy Technician

## 2023-10-17 NOTE — Progress Notes (Signed)
Specialty Pharmacy Refill Coordination Note  Kimberly Jenkins is a 57 y.o. female contacted today regarding refills of specialty medication(s) Adalimumab (Humira (2 Pen))   Patient requested Delivery   Delivery date: 10/23/23   Verified address: [address]  [place]   Medication will be filled on 10/22/23.

## 2023-10-18 ENCOUNTER — Ambulatory Visit: Payer: 59 | Admitting: Allergy and Immunology

## 2023-10-22 ENCOUNTER — Ambulatory Visit (INDEPENDENT_AMBULATORY_CARE_PROVIDER_SITE_OTHER): Payer: 59 | Admitting: *Deleted

## 2023-10-22 DIAGNOSIS — J4550 Severe persistent asthma, uncomplicated: Secondary | ICD-10-CM

## 2023-10-28 ENCOUNTER — Other Ambulatory Visit: Payer: Self-pay | Admitting: Allergy and Immunology

## 2023-11-07 ENCOUNTER — Encounter: Payer: Self-pay | Admitting: Allergy and Immunology

## 2023-11-07 ENCOUNTER — Ambulatory Visit (INDEPENDENT_AMBULATORY_CARE_PROVIDER_SITE_OTHER): Payer: 59 | Admitting: Allergy and Immunology

## 2023-11-07 VITALS — BP 108/68 | HR 76 | Resp 16

## 2023-11-07 DIAGNOSIS — J3089 Other allergic rhinitis: Secondary | ICD-10-CM

## 2023-11-07 DIAGNOSIS — B370 Candidal stomatitis: Secondary | ICD-10-CM

## 2023-11-07 DIAGNOSIS — J4550 Severe persistent asthma, uncomplicated: Secondary | ICD-10-CM

## 2023-11-07 DIAGNOSIS — K219 Gastro-esophageal reflux disease without esophagitis: Secondary | ICD-10-CM | POA: Diagnosis not present

## 2023-11-07 DIAGNOSIS — D849 Immunodeficiency, unspecified: Secondary | ICD-10-CM

## 2023-11-07 MED ORDER — FLUTICASONE PROPIONATE 50 MCG/ACT NA SUSP: NASAL | 5 refills | Status: DC

## 2023-11-07 NOTE — Progress Notes (Unsigned)
 [place] - [place] - [place] - [place]   Follow-up Note  Referring Provider: Grayland Jack, * Primary Provider: Grayland Jack, FNP Date of Office Visit: 11/07/2023  Subjective:   Kimberly Jenkins (DOB: 06-Apr-1966) is a 58 y.o. female who [facility] in re-evaluation of the following:  HPI: Kimberly Jenkins returns to this clinic in evaluation of asthma, allergic rhinitis, LPR, thrush, immunosuppression for rheumatoid arthritis.  I last saw in this clinic 18 April 2023.  She is actually done very well since her last visit without the need for systemic steroid or antibiotic for any type of airway issue while she continues on a collection of anti-inflammatory agents for her airway that includes mepolizumab injections.  She still uses a short acting bronchodilator around the time of exercise.  She believes that her reflux is under very good control on an aggressive plan directed against reflux disease which includes a proton pump inhibitor and H2 receptor blocker.  She has not been having much problems with thrush while using nystatin after her inhalers.  She continues on Humira.  She did obtain this years flu vaccine.  Allergies as of 11/07/2023       Reactions   Methotrexate Diarrhea   Advair Diskus [fluticasone-salmeterol] Itching, Nausea And Vomiting   Aspirin Nausea And Vomiting   Ciprofloxacin Nausea And Vomiting   Cephalexin Nausea And Vomiting   Clonidine Derivatives Rash   Patches blistered skin        Medication List    ALIGN PO Take 1 tablet by mouth daily.   amphetamine-dextroamphetamine 15 MG tablet Commonly known as: ADDERALL Take 15 mg by mouth 2 (two) times daily.   Breo Ellipta 200-25 MCG/INH Aepb Generic drug: fluticasone furoate-vilanterol Inhale one puff once daily   busPIRone 15 MG tablet Commonly known as: BUSPAR Take 15 mg by mouth 3 (three) times daily.   cetirizine  10 MG tablet Commonly known as: ZYRTEC Take 10 mg by mouth daily.   clorazepate 3.75 MG tablet Commonly known as: TRANXENE Take 3.75 mg by mouth in the morning, at noon, and at bedtime.   cyclobenzaprine 10 MG tablet Commonly known as: FLEXERIL Take 10 mg by mouth at bedtime.   EPINEPHrine 0.3 mg/0.3 mL Soaj injection Commonly known as: EpiPen 2-Pak Use as directed for life-threatening allergic reaction.   famotidine 20 MG tablet Commonly known as: PEPCID Take 20 mg by mouth at bedtime.   fluconazole 150 MG tablet Commonly known as: DIFLUCAN Take by mouth.   FLUoxetine 40 MG capsule Commonly known as: PROZAC Take 80 mg by mouth daily.   fluticasone 50 MCG/ACT nasal spray Commonly known as: FLONASE Place 2 sprays into both nostrils daily as needed for allergies.   furosemide 40 MG tablet Commonly known as: LASIX Take 20 mg by mouth every other day.   gabapentin 600 MG tablet Commonly known as: NEURONTIN Take 600 mg by mouth 3 (three) times daily.   Humira (2 Pen) 40 MG/0.8ML Ajkt pen Generic drug: adalimumab INJECT 40 MG INTO THE SKIN EVERY 14 DAYS   icosapent Ethyl 1 g capsule Commonly known as: VASCEPA Take 1 g by mouth 2 (two) times daily.   Ipratropium-Albuterol 20-100 MCG/ACT Aers respimat Commonly known as: COMBIVENT Inhale 1 puff into the lungs every 6 (six) hours as needed for wheezing.   ipratropium-albuterol 0.5-2.5 (3) MG/3ML Soln Commonly known as: DUONEB CAN USE ONE VIAL IN NEBULIZER EVERY 4-6 HOURS AS NEEDED  FOR COUGH OR WHEEZE   lactulose 10 GM/15ML solution Commonly known as: CHRONULAC as needed.   Lantus SoloStar 100 UNIT/ML Solostar Pen Generic drug: insulin glargine Inject 45 Units into the skin at bedtime. Hold if blood sugar is <130   leflunomide 20 MG tablet Commonly known as: ARAVA Take 1 tablet (20 mg total) by mouth daily.   levothyroxine 50 MCG tablet Commonly known as: SYNTHROID Take 50 mcg by mouth daily before  breakfast.   Linzess 72 MCG capsule Generic drug: linaclotide Take 72 mcg by mouth daily as needed (constipation).   mesalamine 1.2 g EC tablet Commonly known as: LIALDA Take 2.4 g by mouth daily.   montelukast 10 MG tablet Commonly known as: SINGULAIR Take 10 mg by mouth at bedtime.   Mounjaro 7.5 MG/0.5ML Pen Generic drug: tirzepatide SMARTSIG:1 pre-filled pen syringe SUB-Q Once a Week   naloxone 4 MG/0.1ML Liqd nasal spray kit Commonly known as: NARCAN Place 1 spray into the nose as directed.   Nucala 100 MG injection Generic drug: mepolizumab INJECT 100MG  SUBCUTANEOUSLY  EVERY 4 WEEKS   nystatin 100000 UNIT/ML suspension Commonly known as: MYCOSTATIN Swish and spit with 5 mls after Breo/Spiriva use.   nystatin cream Commonly known as: MYCOSTATIN Apply topically.   olopatadine 0.1 % ophthalmic solution Commonly known as: PATANOL CAN USE ONE DROP IN EACH EYE TWICE DAILY IF NEEDED FOR RED, ITCHY, WATERY EYES.   omeprazole 40 MG capsule Commonly known as: PRILOSEC Take 40 mg by mouth 2 (two) times daily.   ondansetron 8 MG tablet Commonly known as: ZOFRAN Take 8 mg by mouth every 8 (eight) hours as needed for nausea or vomiting.   Oxycodone HCl 10 MG Tabs Take 10 mg by mouth every 8 (eight) hours.   potassium chloride SA 20 MEQ tablet Commonly known as: KLOR-CON M Take 20 mEq by mouth 2 (two) times daily.   pramipexole 0.125 MG tablet Commonly known as: MIRAPEX Take 0.125 mg by mouth at bedtime.   pravastatin 40 MG tablet Commonly known as: PRAVACHOL Take 40 mg by mouth daily.   primidone 250 MG tablet Commonly known as: MYSOLINE Take 250 mg by mouth at bedtime.   promethazine-dextromethorphan 6.25-15 MG/5ML syrup Commonly known as: PROMETHAZINE-DM Take 5 mLs by mouth 4 (four) times daily as needed for cough.   Rexulti 3 MG Tabs Generic drug: Brexpiprazole Take 1 tablet by mouth daily.   Spiriva Respimat 1.25 MCG/ACT Aers Generic drug:  Tiotropium Bromide Monohydrate Inhale two puffs once daily   topiramate 50 MG tablet Commonly known as: TOPAMAX Take 50 mg by mouth 2 (two) times daily.   triamcinolone cream 0.1 % Commonly known as: KENALOG Apply 1 application  topically daily as needed (cracks at the corners of her mouth).   Vitamin D (Ergocalciferol) 1.25 MG (50000 UNIT) Caps capsule Commonly known as: DRISDOL Take 50,000 Units by mouth every Sunday.    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 Overview:  Normal pharmacologic MPS Formatting of this note might be different from the original. Normal pharmacologic MPS Formatting of  this note might be different from the original. Formatting of this note might be different from the original. Normal pharmacologic MPS Formatting of this note might be different from the original. Form   Chronic kidney disease (CKD), stage II (mild)    Chronic obstructive pulmonary disease (HCC) 08/27/2016   Chronic post-traumatic headache 02/17/2010   Current mild episode of major depressive disorder without prior episode (HCC) 12/12/2017   Dehydration    Depression, unspecified 02/17/2010   Diabetes 08/27/2016   Dyslipidemia 08/27/2016   Edema 05/10/2020   Elevated sed rate 07/13/2016   48 on 05/2016   Epistaxis 05/10/2020   Essential hypertension 05/18/2015   Fatty liver    Fibroma 05/10/2020   Fibromyalgia 08/27/2016   First degree AV block    Fluid overload 05/07/2020   Foot pain 07/13/2016   Generalized anxiety disorder 05/12/2015   GERD (gastroesophageal reflux disease)    Hand  pain 07/13/2016   High risk medication use 07/18/2016   History of hiatal hernia    History of malignant neoplasm of kidney 02/17/2010   Formatting of this note might be different from the original. Formatting of this note might be different from the original. Right partial nephrectomy 2008 Formatting of this note might be different from the original. Overview:  Right partial nephrectomy 2008   History of renal cell carcinoma 08/27/2016   Right partial nephrectomy 2008   Hypercholesterolemia 05/10/2020   Formatting of this note might be different from the original. Formatting of this note might be different from the original. 12/07/2014 - TC 151/Trig 199/HDL 47/LDL 64  Lp(a) 10 ( <16)   Hyperlipidemia associated with type 2 diabetes mellitus (HCC) 02/17/2010   Formatting of this note might be different from the original. Overview:  12/07/2014 - TC 151/Trig 199/HDL 47/LDL 64  Lp(a) 10 ( <10)  Overview:  12/07/2014 - TC 151/Trig 199/HDL 47/LDL 64  Lp(a) 10 ( <96) Formatting of this note might be different from the original. 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   Kidney disease    Migraine, unspecified, not intractable, without status migrainosus 05/10/2020   Mood disorder (HCC) 06/17/2015   Obesity 03/01/2015   Open wound of toe 05/03/2016   Osteoarthritis    Other allergic rhinitis 04/30/2015   Other amnesia 02/17/2010   Other chronic diseases of tonsils and adenoids 05/10/2020   Other specified personal risk factors, not elsewhere classified 07/18/2016   Pain in right knee 08/28/2019   Rheumatoid arthritis (HCC)    Risk for falls 05/02/2016   Severe persistent asthma 04/30/2015   Sleep apnea 08/27/2016   On CPAP, 2 L oxygen   Sleep disturbance 02/17/2010   Spinal stenosis in cervical region 02/17/2010   Tremor     Vitamin D deficiency 07/08/2015    Past Surgical History:  Procedure Laterality Date   ANTERIOR CERVICAL DECOMP/DISCECTOMY FUSION N/A 10/06/2022   Procedure: C3-4, C4-5, C5-6 ANTERIOR CERVICAL DISCECTOMY FUSION, ALLOGRAFT, PLATE;  Surgeon: Eldred Manges, MD;  Location: [facility] OR;  Service: Orthopedics;  Laterality: N/A;  Needs RNFA   APPENDECTOMY  1983   CESAREAN SECTION     2 times, 1986 & 1983   CHOLECYSTECTOMY  2009   COLONOSCOPY W/ POLYPECTOMY  2021   ELBOW SURGERY Right    ulner nerve release   ESOPHAGEAL DILATION  2021   KIDNEY SURGERY Right 2008   1/2 right kidney removed   pellet insertion  03/2021   per patient   SHOULDER SURGERY Right 2006   TONSILLECTOMY  2011   TOTAL SHOULDER ARTHROPLASTY Right    TUBAL LIGATION  1986   WRIST SURGERY Right    ulner nerve release    Review of systems negative except as noted in HPI / PMHx or noted below:  Review of Systems  Constitutional: Negative.   HENT: Negative.    Eyes: Negative.   Respiratory: Negative.    Cardiovascular: Negative.   Gastrointestinal: Negative.   Genitourinary: Negative.   Musculoskeletal: Negative.   Skin: Negative.   Neurological: Negative.   Endo/Heme/Allergies: Negative.   Psychiatric/Behavioral: Negative.       Objective:   Vitals:   11/07/23 1102  BP: 108/68  Pulse: 76  Resp: 16  SpO2: 97%          Physical Exam Constitutional:      Appearance: She is not diaphoretic.  HENT:     Head: Normocephalic.     Right Ear: Tympanic membrane, ear canal and external ear normal.     Left Ear: Tympanic membrane, ear canal and external ear normal.     Nose: Nose normal. No mucosal edema or rhinorrhea.     Mouth/Throat:     Pharynx: Uvula midline. No oropharyngeal exudate.  Eyes:     Conjunctiva/sclera: Conjunctivae normal.  Neck:     Thyroid: No thyromegaly.     Trachea: Trachea normal. No tracheal tenderness or tracheal deviation.  Cardiovascular:     Rate and Rhythm: Normal rate and  regular rhythm.     Heart sounds: Normal heart sounds, S1 normal and S2 normal. No murmur heard. Pulmonary:     Effort: No respiratory distress.     Breath sounds: Normal breath sounds. No stridor. No wheezing or rales.  Lymphadenopathy:     Head:     Right side of head: No tonsillar adenopathy.     Left side of head: No tonsillar adenopathy.     Cervical: No cervical adenopathy.  Skin:    Findings: No erythema or rash.     Nails: There is no clubbing.  Neurological:     Mental Status: She is alert.     Diagnostics: Spirometry was performed and demonstrated an FEV1 of 1.50 at 65 % of predicted.  Assessment and Plan:   1. Asthma, severe persistent, well-controlled   2. Other allergic rhinitis   3. LPRD (laryngopharyngeal reflux disease)   4. Thrush   5. Immunosuppression (HCC)    1. Continue Breo 200 - 1 inhalation +  Spiriva 1.25 Respimat - 2 inhalations daily   2. Continue Mepolizumab injections every 4 weeks   3. Continue Montelukast 10 mg - 1 tablet one time per day  4. Continue Flonase - 1-2 sprays each nostril 1 time per day  5. Continue Omeprazole 40 mg - 1 tablet two times per day + Pepcid 20 mg in evening  6. If needed:  A. Nystatin oral solution - 5 mls swish and spit after BREO / Spiriva use B. Duoneb / Albuterol / Combivent Respimat 2 puffs every 4-6 hours C. Zyrtec   7. Return to clinic in 6 months or earlier if problem  8.  Influenza = Tamiflu.  COVID = Paxlovid.  Kimberly Jenkins is doing very well regarding her airway and her reflux on her current plan of using anti-inflammatory agents for airway including mepolizumab  injections and continuing to aggressively treat her reflux with a proton pump inhibitor and H2 receptor blocker.  He has a very good understanding of her disease state and how medications work.  She will remain on the plan noted above and I will see her back in this clinic in 6 months or earlier if there is a problem while utilizing this plan.    Laurette Schimke, MD Allergy / Immunology [facility]

## 2023-11-07 NOTE — Patient Instructions (Signed)
  1. Continue Breo 200 - 1 inhalation +  Spiriva 1.25 Respimat - 2 inhalations daily   2. Continue Mepolizumab injections every 4 weeks   3. Continue Montelukast 10 mg - 1 tablet one time per day  4. Continue Flonase - 1-2 sprays each nostril 1 time per day  5. Continue Omeprazole 40 mg - 1 tablet two times per day + Pepcid 20 mg in evening  6. If needed:  A. Nystatin oral solution - 5 mls swish and spit after BREO / Spiriva use B. Duoneb / Albuterol / Combivent Respimat 2 puffs every 4-6 hours C. Zyrtec   7. Return to clinic in 6 months or earlier if problem  8.  Influenza = Tamiflu.  COVID = Paxlovid.

## 2023-11-08 ENCOUNTER — Other Ambulatory Visit: Payer: Self-pay

## 2023-11-08 ENCOUNTER — Other Ambulatory Visit: Payer: Self-pay | Admitting: Rheumatology

## 2023-11-08 ENCOUNTER — Other Ambulatory Visit (HOSPITAL_COMMUNITY): Payer: Self-pay

## 2023-11-08 ENCOUNTER — Encounter: Payer: Self-pay | Admitting: Allergy and Immunology

## 2023-11-08 MED ORDER — HUMIRA (2 PEN) 40 MG/0.8ML ~~LOC~~ AJKT
AUTO-INJECTOR | SUBCUTANEOUS | 0 refills | Status: DC
  Filled 2023-11-08: qty 2, 28d supply, fill #0
  Filled 2023-12-13 (×2): qty 2, 28d supply, fill #1
  Filled 2024-01-22: qty 2, 28d supply, fill #2

## 2023-11-08 NOTE — Progress Notes (Signed)
 Specialty Pharmacy Refill Coordination Note  Kimberly Jenkins is a 58 y.o. female contacted today regarding refills of specialty medication(s) Adalimumab (Humira (2 Pen))   Patient requested Delivery   Delivery date: 11/20/23   Verified address: [address]  [place] [address]   Medication will be filled on 11/19/23. This fill date is pending response to refill request from provider. Patient is aware and if they have not received fill by intended date they must follow up with pharmacy.

## 2023-11-08 NOTE — Telephone Encounter (Signed)
 Last Fill: 08/06/2023  Labs: 09/25/2023 CBC WNL. Glucose is 111. Creatinine is borderline elevated but stable-1.05. rest of CMP WNL   TB Gold: 12/21/2022 Negative   Next Visit: 02/25/2024  Last Visit: 09/25/2023  [id number] arthritis of multiple sites with negative rheumatoid factor   Current Dose per office note 09/25/2023: Humira 40 mg sq injections every 14 days   Okay to refill Humira?

## 2023-11-08 NOTE — Progress Notes (Signed)
 Specialty Pharmacy Ongoing Clinical Assessment Note  Kimberly Jenkins is a 58 y.o. female who is being followed by the specialty pharmacy service for RxSp Rheumatoid Arthritis   Patient's specialty medication(s) reviewed today: Adalimumab (Humira (2 Pen))   Missed doses in the last 4 weeks: 0   Patient/Caregiver did not have any additional questions or concerns.   Therapeutic benefit summary: Patient is achieving benefit   Adverse events/side effects summary: No adverse events/side effects   Patient's therapy is appropriate to: Continue    Goals Addressed             This Visit's Progress    Minimize recurrence of flares   On track    Patient is on track. Patient will maintain adherence         Follow up:  6 months  Servando Snare Specialty Pharmacist

## 2023-11-16 IMAGING — MG DIGITAL DIAGNOSTIC BILAT W/ TOMO W/ CAD
6 of 10 series · 6 of 30 positions shown · non-contrast
Comparison: Previous exam(s).

CLINICAL DATA: Twelve month follow-up of a right breast asymmetry.

EXAM:
DIGITAL DIAGNOSTIC BILATERAL MAMMOGRAM WITH TOMOSYNTHESIS AND CAD
TECHNIQUE: Bilateral digital diagnostic mammography and breast tomosynthesis
was performed. The images were evaluated with computer-aided
detection.

[L CC synth-2D]
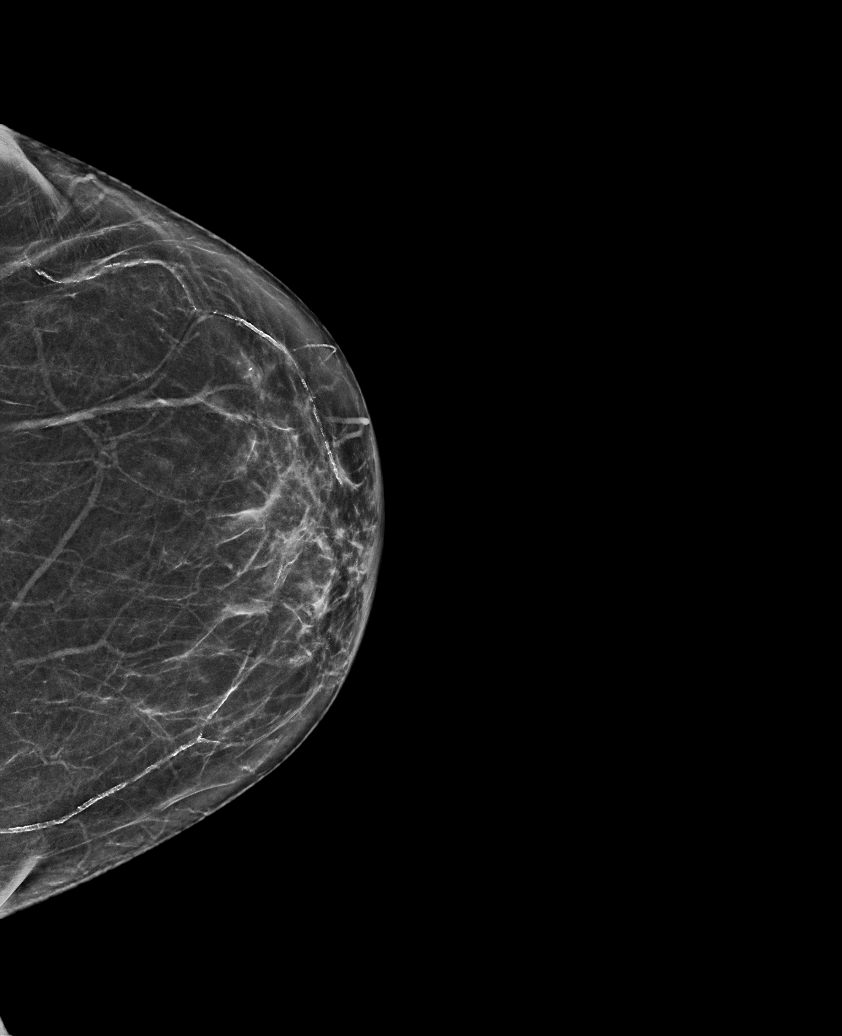

[R CC synth-2D]
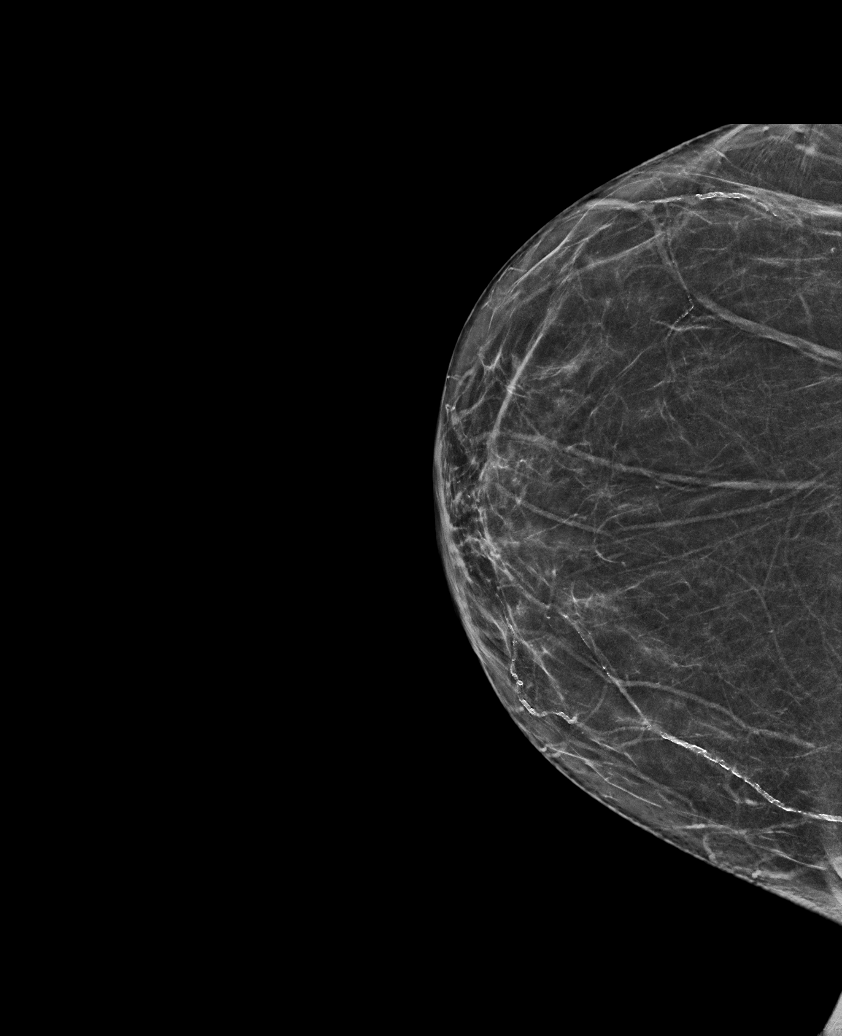

[L MLO synth-2D]
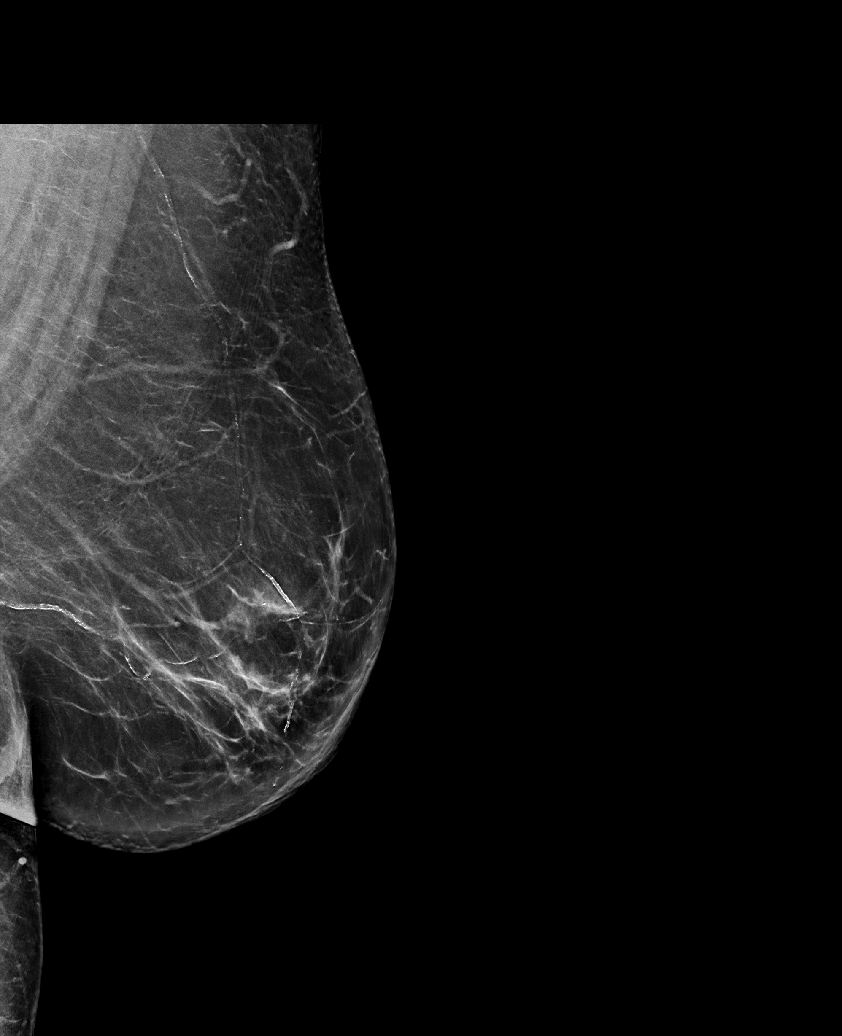

[R MLO synth-2D (1 of 2)]
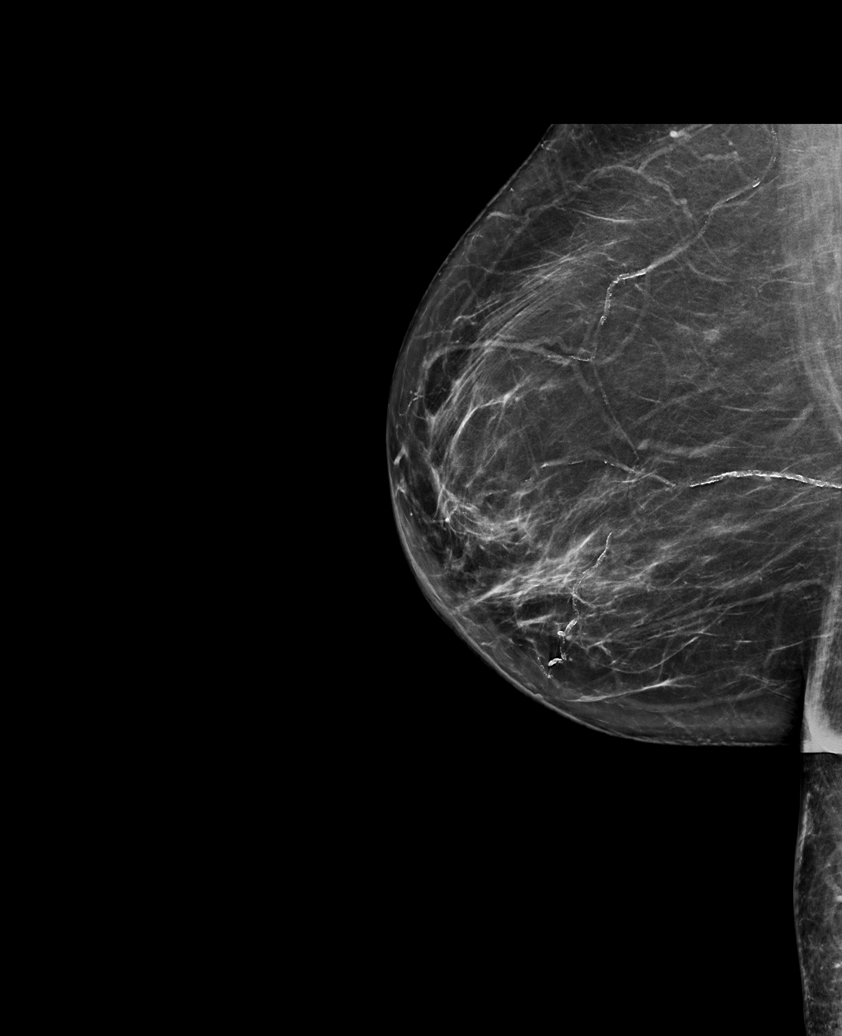

[R MLO synth-2D (2 of 2)]
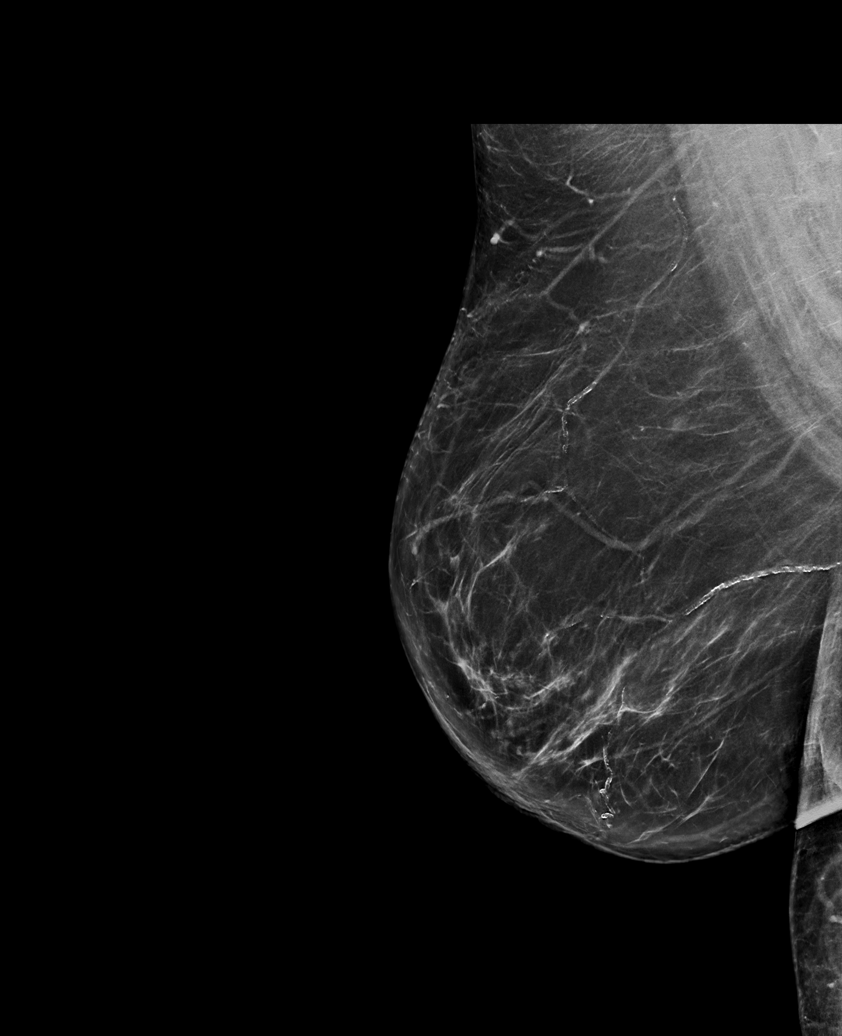

[L CC tomo · tomo slice 33/64.0]
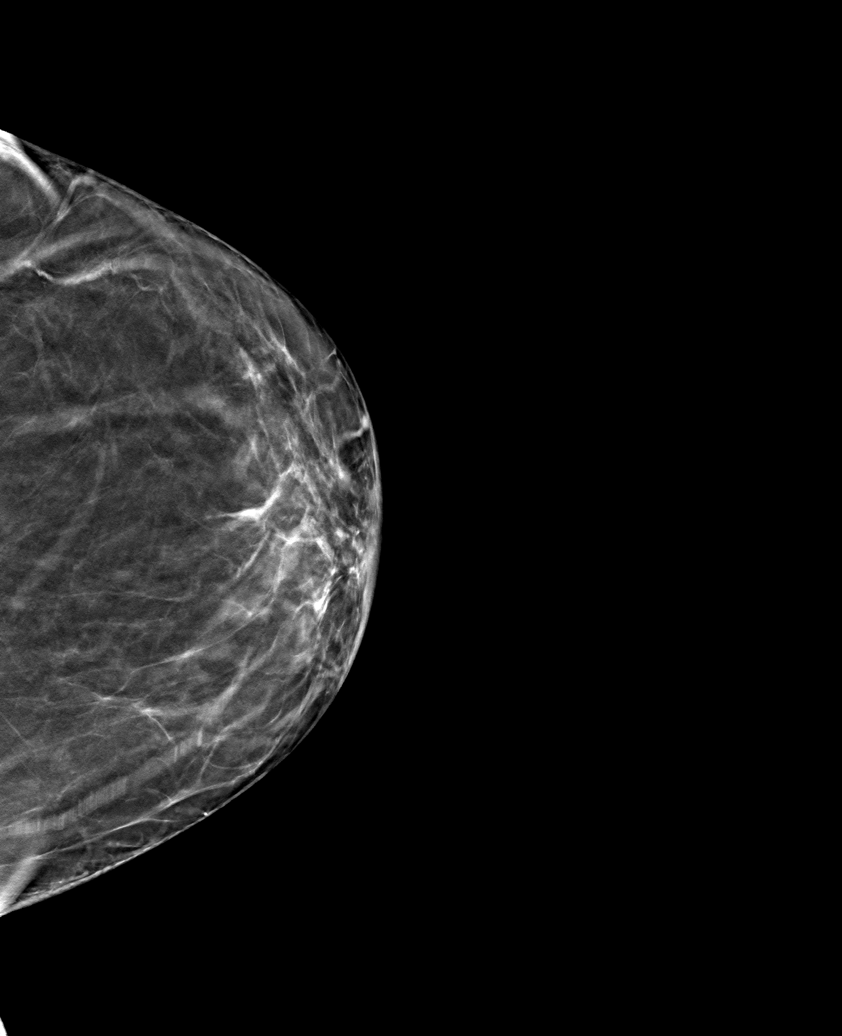

[6 of 30 positions shown; findings below may reference images not displayed]

ACR Breast Density Category b: There are scattered areas of
fibroglandular density.
FINDINGS: The right breast asymmetry is unchanged and probably benign. No new
or suspicious findings in either breast.
IMPRESSION: Stable probably benign right breast asymmetry. No other suspicious
findings in either breast.

RECOMMENDATION:
Recommend 12 month follow-up mammography of the probably benign
right breast asymmetry.

I have discussed the findings and recommendations with the patient.
If applicable, a reminder letter will be sent to the patient
regarding the next appointment.

BI-RADS CATEGORY  3: Probably benign.

## 2023-11-19 ENCOUNTER — Ambulatory Visit: Payer: 59

## 2023-11-26 ENCOUNTER — Ambulatory Visit (INDEPENDENT_AMBULATORY_CARE_PROVIDER_SITE_OTHER): Payer: Self-pay | Admitting: *Deleted

## 2023-11-26 DIAGNOSIS — J4550 Severe persistent asthma, uncomplicated: Secondary | ICD-10-CM | POA: Diagnosis not present

## 2023-12-11 ENCOUNTER — Other Ambulatory Visit: Payer: Self-pay

## 2023-12-13 ENCOUNTER — Other Ambulatory Visit (HOSPITAL_COMMUNITY): Payer: Self-pay

## 2023-12-13 ENCOUNTER — Other Ambulatory Visit: Payer: Self-pay

## 2023-12-13 NOTE — Progress Notes (Signed)
 Specialty Pharmacy Refill Coordination Note  Kimberly Jenkins is a 58 y.o. female contacted today regarding refills of specialty medication(s) Humira.  Patient requested (Patient-Rptd) Delivery   Delivery date: (Patient-Rptd) 12/17/23   Verified address: (Patient-Rptd) [address]  [place] [address]   Medication will be filled on 12/17/23. New delivery date is 12/18/23. Patient has been notified.

## 2023-12-17 ENCOUNTER — Other Ambulatory Visit: Payer: Self-pay

## 2023-12-20 ENCOUNTER — Other Ambulatory Visit (HOSPITAL_COMMUNITY): Payer: Self-pay

## 2023-12-24 ENCOUNTER — Ambulatory Visit

## 2023-12-25 ENCOUNTER — Ambulatory Visit (INDEPENDENT_AMBULATORY_CARE_PROVIDER_SITE_OTHER)

## 2023-12-25 DIAGNOSIS — J4550 Severe persistent asthma, uncomplicated: Secondary | ICD-10-CM

## 2024-01-15 ENCOUNTER — Other Ambulatory Visit: Payer: Self-pay | Admitting: *Deleted

## 2024-01-15 MED ORDER — NUCALA 40 MG/0.4ML ~~LOC~~ SOSY: 100.0000 mg | PREFILLED_SYRINGE | SUBCUTANEOUS | 11 refills | Status: DC

## 2024-01-16 ENCOUNTER — Other Ambulatory Visit (HOSPITAL_COMMUNITY): Payer: Self-pay

## 2024-01-18 ENCOUNTER — Other Ambulatory Visit (HOSPITAL_COMMUNITY): Payer: Self-pay

## 2024-01-22 ENCOUNTER — Other Ambulatory Visit: Payer: Self-pay

## 2024-01-22 ENCOUNTER — Ambulatory Visit (INDEPENDENT_AMBULATORY_CARE_PROVIDER_SITE_OTHER)

## 2024-01-22 DIAGNOSIS — J4550 Severe persistent asthma, uncomplicated: Secondary | ICD-10-CM

## 2024-01-22 NOTE — Progress Notes (Signed)
 Specialty Pharmacy Refill Coordination Note  Kimberly Jenkins is a 58 y.o. female contacted today regarding refills of specialty medication(s) Adalimumab  (Humira  (2 Pen))   Patient requested Delivery   Delivery date: 01/25/24   Verified address: [address] [place] [address]   Medication will be filled on 05.29.25.

## 2024-01-24 ENCOUNTER — Other Ambulatory Visit: Payer: Self-pay

## 2024-01-30 ENCOUNTER — Other Ambulatory Visit: Payer: Self-pay | Admitting: *Deleted

## 2024-01-30 MED ORDER — NUCALA 100 MG/ML ~~LOC~~ SOSY: 100.0000 mg | PREFILLED_SYRINGE | SUBCUTANEOUS | 11 refills | Status: AC

## 2024-01-31 ENCOUNTER — Other Ambulatory Visit: Payer: Self-pay

## 2024-02-11 NOTE — Progress Notes (Deleted)
 Office Visit Note  Patient: Kimberly Jenkins             Date of Birth: Dec 29, 1965           MRN: [id number]             PCP: Trudy Wanda MATSU, FNP Referring: Trudy Wanda MATSU, * Visit Date: 02/25/2024 Occupation: @GUAROCC @  Subjective:    History of Present Illness: Kimberly Jenkins is a 58 y.o. female with history of seronegative rheumatoid arthritis and fibromyalgia.  Patient remains on  Humira  40 mg sq injections every 14 days (11/02/2016), Arava  20 mg by mouth daily (12/13/2016)   CBC and CMP updated on 12/05/23.  Lipid panel  TB gold negative on  Discussed the importance of holding humira  if she develops signs or symptoms of an infection and to resume once the infection has completely cleared.   Activities of Daily Living:  Patient reports morning stiffness for *** {minute/hour:19697}.   Patient {ACTIONS;DENIES/REPORTS:21021675::Denies} nocturnal pain.  Difficulty dressing/grooming: {ACTIONS;DENIES/REPORTS:21021675::Denies} Difficulty climbing stairs: {ACTIONS;DENIES/REPORTS:21021675::Denies} Difficulty getting out of chair: {ACTIONS;DENIES/REPORTS:21021675::Denies} Difficulty using hands for taps, buttons, cutlery, and/or writing: {ACTIONS;DENIES/REPORTS:21021675::Denies}  No Rheumatology ROS completed.   PMFS History:  Patient Active Problem List   Diagnosis Date Noted   Protrusion of cervical intervertebral disc 07/19/2022   Herniation of cervical intervertebral disc with radiculopathy 07/19/2022   Other spondylosis with radiculopathy, cervical region 07/04/2022   Cancer of kidney (HCC) 07/11/2021   Dehydration 07/11/2021   Kidney disease 07/11/2021   Hypokalemia 05/11/2020   Costochondritis 05/10/2020   Edema 05/10/2020   Epistaxis 05/10/2020   Fibroma 05/10/2020   Hypercholesterolemia 05/10/2020   Iron deficiency anemia secondary to blood loss (chronic) 05/10/2020   Migraine, unspecified, not intractable, without status migrainosus  05/10/2020   Other chronic diseases of tonsils and adenoids 05/10/2020   Hypertrophy of tonsils 05/10/2020   Asthma 05/07/2020   Fluid overload 05/07/2020   Pain in right knee 08/28/2019   Carpal tunnel syndrome of right wrist 04/17/2019   Ulnar tunnel syndrome of right wrist 04/17/2019   Lesion of ulnar nerve, right upper limb 11/12/2018   Current mild episode of major depressive disorder without prior episode (HCC) 12/12/2017   Abnormal weight loss 11/15/2017   Fibromyalgia 08/27/2016   History of right rotator cuff tear repair 08/27/2016   Diabetes 08/27/2016   Chronic obstructive pulmonary disease (HCC) 08/27/2016   Dyslipidemia 08/27/2016   History of renal cell carcinoma 08/27/2016   Anxiety 08/27/2016   Sleep apnea 08/27/2016   Rheumatoid arthritis of multiple sites with negative rheumatoid factor  07/18/2016   High risk medication use 07/18/2016   Other specified personal risk factors, not elsewhere classified 07/18/2016   Hand pain 07/13/2016   Foot pain 07/13/2016   Elevated sed rate 07/13/2016   Open wound of toe 05/03/2016   Ataxic gait 05/02/2016   Risk for falls 05/02/2016   Tremor 05/02/2016   Vitamin D  deficiency 07/08/2015   Chest pain, unspecified 06/23/2015   Mood disorder (HCC) 06/17/2015   Essential hypertension 05/18/2015   Generalized anxiety disorder 05/12/2015   Acquired hypothyroidism 05/05/2015   Severe persistent asthma 04/30/2015   Other allergic rhinitis 04/30/2015   GERD (gastroesophageal reflux disease) 04/30/2015   Chronic kidney disease, stage 2 (mild) 03/02/2015   Obesity 03/01/2015   Chronic post-traumatic headache 02/17/2010   Depression, unspecified 02/17/2010   History of malignant neoplasm of kidney 02/17/2010   Hyperlipidemia associated with type 2 diabetes mellitus (HCC) 02/17/2010   Other amnesia  02/17/2010   Polyosteoarthritis, 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 Overview:  Normal pharmacologic MPS Formatting of this note might be different from the original. Normal pharmacologic MPS Formatting of this note might be different from the original. Formatting of this note might be different from the original. Normal pharmacologic MPS Formatting of this note might be different from the original. Form   Chronic kidney disease (CKD), stage II (mild)    Chronic obstructive pulmonary disease (HCC) 08/27/2016   Chronic post-traumatic headache 02/17/2010   Current mild episode of major depressive disorder without prior episode (HCC) 12/12/2017   Dehydration    Depression, unspecified 02/17/2010   Diabetes 08/27/2016   Dyslipidemia 08/27/2016   Edema 05/10/2020   Elevated sed rate 07/13/2016   48 on 05/2016   Epistaxis 05/10/2020   Essential hypertension 05/18/2015   Fatty liver    Fibroma 05/10/2020   Fibromyalgia 08/27/2016   First degree AV block    Fluid overload 05/07/2020   Foot pain 07/13/2016   Generalized anxiety disorder 05/12/2015   GERD (gastroesophageal reflux disease)    Hand pain 07/13/2016   High risk medication use 07/18/2016   History of hiatal hernia    History of malignant neoplasm of kidney 02/17/2010   Formatting of this note might be different from the original. Formatting of this note might be different from the original. Right partial nephrectomy 2008 Formatting of this note might be different from the original. Overview:  Right partial nephrectomy 2008   History of renal cell  carcinoma 08/27/2016   Right partial nephrectomy 2008   Hypercholesterolemia 05/10/2020   Formatting of this note might be different from the original. Formatting of this note might be different from the original. 12/07/2014 - TC 151/Trig 199/HDL 47/LDL 64  Lp(a) 10 ( <69)   Hyperlipidemia associated with type 2 diabetes mellitus (HCC) 02/17/2010   Formatting of this note might be different from the original. Overview:  12/07/2014 - TC 151/Trig 199/HDL 47/LDL 64  Lp(a) 10 ( <69)  Overview:  12/07/2014 - TC 151/Trig 199/HDL 47/LDL 64  Lp(a) 10 ( <69) Formatting of this note might be different from the original. 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   Kidney disease    Migraine, unspecified, not intractable, without status migrainosus 05/10/2020   Mood disorder (HCC) 06/17/2015   Obesity 03/01/2015   Open wound of toe 05/03/2016   Osteoarthritis    Other allergic rhinitis 04/30/2015   Other amnesia 02/17/2010   Other chronic diseases of tonsils and adenoids 05/10/2020   Other specified personal risk factors, not elsewhere classified 07/18/2016   Pain in right knee 08/28/2019   Rheumatoid arthritis (HCC)    Risk for falls 05/02/2016   Severe persistent asthma 04/30/2015   Sleep apnea 08/27/2016  On CPAP, 2 L oxygen   Sleep disturbance 02/17/2010   Spinal stenosis in cervical region 02/17/2010   Tremor    Vitamin D  deficiency 07/08/2015    Family History  Problem Relation Age of Onset   Asthma Mother    Lung cancer Mother    Asthma Father    Allergic rhinitis Father    COPD Father    Heart attack Father    Arthritis Father    Brain cancer Paternal Uncle    Asthma Maternal Grandmother    Brain cancer Paternal Grandmother    Breast cancer Cousin    Breast cancer Cousin    Breast cancer Cousin    Brain cancer  Brother    Heart attack Brother    Past Surgical History:  Procedure Laterality Date   ANTERIOR CERVICAL DECOMP/DISCECTOMY FUSION N/A 10/06/2022   Procedure: C3-4, C4-5, C5-6 ANTERIOR CERVICAL DISCECTOMY FUSION, ALLOGRAFT, PLATE;  Surgeon: Barbarann Oneil BROCKS, MD;  Location: [facility] OR;  Service: Orthopedics;  Laterality: N/A;  Needs RNFA   APPENDECTOMY  1983   CESAREAN SECTION     2 times, 1986 & 1983   CHOLECYSTECTOMY  2009   COLONOSCOPY W/ POLYPECTOMY  2021   ELBOW SURGERY Right    ulner nerve release   ESOPHAGEAL DILATION  2021   KIDNEY SURGERY Right 2008   1/2 right kidney removed   pellet insertion  03/2021   per patient   SHOULDER SURGERY Right 2006   TONSILLECTOMY  2011   TOTAL SHOULDER ARTHROPLASTY Right    TUBAL LIGATION  1986   WRIST SURGERY Right    ulner nerve release   Social History   Social History Narrative   Not on file   Immunization History  Administered Date(s) Administered   PFIZER(Purple Top)SARS-COV-2 Vaccination 11/18/2019, 12/09/2019, 08/05/2020     Objective: Vital Signs: There were no vitals taken for this visit.   Physical Exam Vitals and nursing note reviewed.  Constitutional:      Appearance: She is well-developed.  HENT:     Head: Normocephalic and atraumatic.   Eyes:     Conjunctiva/sclera: Conjunctivae normal.    Cardiovascular:     Rate and Rhythm: Normal rate and regular rhythm.     Heart sounds: Normal heart sounds.  Pulmonary:     Effort: Pulmonary effort is normal.     Breath sounds: Normal breath sounds.  Abdominal:     General: Bowel sounds are normal.     Palpations: Abdomen is soft.   Musculoskeletal:     Cervical back: Normal range of motion.  Lymphadenopathy:     Cervical: No cervical adenopathy.   Skin:    General: Skin is warm and dry.     Capillary Refill: Capillary refill takes less than 2 seconds.   Neurological:     Mental Status: She is alert and oriented to person, place, and time.   Psychiatric:         Behavior: Behavior normal.      Musculoskeletal Exam: ***  CDAI Exam: CDAI Score: -- Patient Global: --; Provider Global: -- Swollen: --; Tender: -- Joint Exam 02/25/2024   No joint exam has been documented for this visit   There is currently no information documented on the homunculus. Go to the Rheumatology activity and complete the homunculus joint exam.  Investigation: No additional findings.  Imaging: No results found.  Recent Labs: Lab Results  Component Value Date   WBC 4.6 09/25/2023   HGB 12.9 09/25/2023  PLT 250 09/25/2023   NA 139 09/25/2023   K 4.3 09/25/2023   CL 108 09/25/2023   CO2 24 09/25/2023   GLUCOSE 111 (H) 09/25/2023   BUN 10 09/25/2023   CREATININE 1.05 (H) 09/25/2023   BILITOT 0.4 09/25/2023   ALKPHOS 81 10/04/2022   AST 16 09/25/2023   ALT 14 09/25/2023   PROT 6.8 09/25/2023   ALBUMIN 3.3 (L) 10/04/2022   CALCIUM 8.9 09/25/2023   GFRAA 79 11/03/2020   QFTBGOLD NEGATIVE 06/26/2017   QFTBGOLDPLUS NEGATIVE 12/21/2022    Speciality Comments: No specialty comments available.  Procedures:  No procedures performed Allergies: Methotrexate , Advair diskus [fluticasone -salmeterol], Aspirin, Ciprofloxacin, Cephalexin, and Clonidine derivatives   Assessment / Plan:     Visit Diagnoses: Rheumatoid arthritis of multiple sites with negative rheumatoid factor   High risk medication use  Fibromyalgia  Trochanteric bursitis of left hip  Primary insomnia  Positive ANA (antinuclear antibody)  Osteopenia of multiple sites  Hx of fusion of cervical spine  Dyslipidemia  History of gastroesophageal reflux (GERD)  History of renal cell carcinoma  History of COPD  History of diabetes mellitus  History of hypothyroidism  Vitamin D  deficiency  Orders: No orders of the defined types were placed in this encounter.  No orders of the defined types were placed in this encounter.   Face-to-face time spent with patient was *** minutes.  Greater than 50% of time was spent in counseling and coordination of care.  Follow-Up Instructions: No follow-ups on file.   Waddell CHRISTELLA Craze, PA-C  Note - This record has been created using Dragon software.  Chart creation errors have been sought, but may not always  have been located. Such creation errors do not reflect on  the standard of medical care.

## 2024-02-15 ENCOUNTER — Other Ambulatory Visit: Payer: Self-pay

## 2024-02-15 ENCOUNTER — Other Ambulatory Visit: Payer: Self-pay | Admitting: Physician Assistant

## 2024-02-15 MED ORDER — HUMIRA (2 PEN) 40 MG/0.8ML ~~LOC~~ AJKT
AUTO-INJECTOR | SUBCUTANEOUS | 0 refills | Status: DC
Start: 2024-02-15 — End: 2024-03-24
  Filled 2024-02-15: qty 2, fill #0
  Filled 2024-02-18: qty 2, 28d supply, fill #0

## 2024-02-15 NOTE — Telephone Encounter (Signed)
 Last Fill: 11/08/2023  Labs: 12/05/2023 Glucose 137, Creat. 1.08, GFR 59  TB Gold: 12/21/2022 Neg    Next Visit: 02/25/2024  Last Visit: 09/25/2023  [id number] arthritis of multiple sites with negative rheumatoid factor   Current Dose per office note 09/25/2023: Humira  40 mg sq injections every 14 days   Patient to update labs to include TB Gold at upcoming appointment on 02/25/2024  Okay to refill Humira ?

## 2024-02-18 ENCOUNTER — Other Ambulatory Visit: Payer: Self-pay

## 2024-02-18 ENCOUNTER — Other Ambulatory Visit: Payer: Self-pay | Admitting: Pharmacy Technician

## 2024-02-18 NOTE — Progress Notes (Signed)
 Specialty Pharmacy Refill Coordination Note  Kimberly Jenkins is a 58 y.o. female contacted today regarding refills of specialty medication(s) Adalimumab  (Humira  (2 Pen))   Patient requested Delivery   Delivery date: 03/04/24   Verified address: [address] [place]   Medication will be filled on 03/03/24.

## 2024-02-19 ENCOUNTER — Ambulatory Visit

## 2024-02-19 DIAGNOSIS — J4550 Severe persistent asthma, uncomplicated: Secondary | ICD-10-CM

## 2024-02-25 ENCOUNTER — Ambulatory Visit: Payer: 59 | Admitting: Physician Assistant

## 2024-02-25 DIAGNOSIS — R768 Other specified abnormal immunological findings in serum: Secondary | ICD-10-CM

## 2024-02-25 DIAGNOSIS — Z981 Arthrodesis status: Secondary | ICD-10-CM

## 2024-02-25 DIAGNOSIS — M7062 Trochanteric bursitis, left hip: Secondary | ICD-10-CM

## 2024-02-25 DIAGNOSIS — Z8709 Personal history of other diseases of the respiratory system: Secondary | ICD-10-CM

## 2024-02-25 DIAGNOSIS — M8589 Other specified disorders of bone density and structure, multiple sites: Secondary | ICD-10-CM

## 2024-02-25 DIAGNOSIS — Z79899 Other long term (current) drug therapy: Secondary | ICD-10-CM

## 2024-02-25 DIAGNOSIS — Z85528 Personal history of other malignant neoplasm of kidney: Secondary | ICD-10-CM

## 2024-02-25 DIAGNOSIS — E785 Hyperlipidemia, unspecified: Secondary | ICD-10-CM

## 2024-02-25 DIAGNOSIS — Z8639 Personal history of other endocrine, nutritional and metabolic disease: Secondary | ICD-10-CM

## 2024-02-25 DIAGNOSIS — M797 Fibromyalgia: Secondary | ICD-10-CM

## 2024-02-25 DIAGNOSIS — M0609 Rheumatoid arthritis without rheumatoid factor, multiple sites: Secondary | ICD-10-CM

## 2024-02-25 DIAGNOSIS — Z8719 Personal history of other diseases of the digestive system: Secondary | ICD-10-CM

## 2024-02-25 DIAGNOSIS — E559 Vitamin D deficiency, unspecified: Secondary | ICD-10-CM

## 2024-02-25 DIAGNOSIS — F5101 Primary insomnia: Secondary | ICD-10-CM

## 2024-02-26 NOTE — Progress Notes (Unsigned)
 Office Visit Note  Patient: Kimberly Jenkins             Date of Birth: [birth year]           MRN: [id number]             PCP: Trudy Wanda MATSU, FNP Referring: Trudy Wanda MATSU, * Visit Date: 02/28/2024 Occupation: @GUAROCC @  Subjective:  Left trochanteric bursitis   History of Present Illness: Kimberly Jenkins is a 58 y.o. female with history of seronegative rheumatoid arthritis and fibromyalgia.  Patient remains on  Humira  40 mg sq injections every 14 days (11/02/2016) and Arava  20 mg by mouth daily (12/13/2016).  She is tolerating combination therapy without any side effects.  She denies any recent gaps in therapy.  She denies any signs or symptoms of a rheumatoid arthritis flare.  She denies any joint swelling at this time.  Patient states that her neck has been doing well.  She has occasional discomfort in her knee joints especially when climbing steps.  Patient presents today with ongoing discomfort in the left hip consistent with left trochanter bursitis.  She has been experiencing significant nocturnal pain causing interrupted sleep at night.  Patient requested a left trochanteric bursa cortisone injection today. She denies any recent or recurrent infections.  She denies any new medical conditions.    Activities of Daily Living:  Patient reports morning stiffness for 30-40 minutes.   Patient Reports nocturnal pain.  Difficulty dressing/grooming: Denies Difficulty climbing stairs: Reports Difficulty getting out of chair: Reports Difficulty using hands for taps, buttons, cutlery, and/or writing: Reports  Review of Systems  Constitutional:  Positive for fatigue.  HENT:  Positive for mouth dryness. Negative for mouth sores.   Eyes:  Positive for itching. Negative for dryness.  Respiratory:  Negative for shortness of breath.   Cardiovascular:  Negative for chest pain and palpitations.  Gastrointestinal:  Negative for blood in stool, constipation and diarrhea.   Endocrine: Negative for increased urination.  Genitourinary:  Positive for involuntary urination.  Musculoskeletal:  Positive for joint pain, gait problem, joint pain, myalgias, morning stiffness, muscle tenderness and myalgias. Negative for joint swelling and muscle weakness.  Skin:  Negative for color change, rash, hair loss and sensitivity to sunlight.  Allergic/Immunologic: Positive for susceptible to infections.  Neurological:  Positive for headaches. Negative for dizziness.  Hematological:  Negative for swollen glands.  Psychiatric/Behavioral:  Positive for sleep disturbance. Negative for depressed mood. The patient is nervous/anxious.     PMFS History:  Patient Active Problem List   Diagnosis Date Noted   Protrusion of cervical intervertebral disc 07/19/2022   Herniation of cervical intervertebral disc with radiculopathy 07/19/2022   Other spondylosis with radiculopathy, cervical region 07/04/2022   Cancer of kidney (HCC) 07/11/2021   Dehydration 07/11/2021   Kidney disease 07/11/2021   Hypokalemia 05/11/2020   Costochondritis 05/10/2020   Edema 05/10/2020   Epistaxis 05/10/2020   Fibroma 05/10/2020   Hypercholesterolemia 05/10/2020   Iron deficiency anemia secondary to blood loss (chronic) 05/10/2020   Migraine, unspecified, not intractable, without status migrainosus 05/10/2020   Other chronic diseases of tonsils and adenoids 05/10/2020   Hypertrophy of tonsils 05/10/2020   Asthma 05/07/2020   Fluid overload 05/07/2020   Pain in right knee 08/28/2019   Carpal tunnel syndrome of right wrist 04/17/2019   Ulnar tunnel syndrome of right wrist 04/17/2019   Lesion of ulnar nerve, right upper limb 11/12/2018   Current mild episode of major depressive disorder without prior  episode (HCC) 12/12/2017   Abnormal weight loss 11/15/2017   Fibromyalgia 08/27/2016   History of right rotator cuff tear repair 08/27/2016   Diabetes 08/27/2016   Chronic obstructive pulmonary disease  (HCC) 08/27/2016   Dyslipidemia 08/27/2016   History of renal cell carcinoma 08/27/2016   Anxiety 08/27/2016   Sleep apnea 08/27/2016   Rheumatoid arthritis of multiple sites with negative rheumatoid factor  07/18/2016   High risk medication use 07/18/2016   Other specified personal risk factors, not elsewhere classified 07/18/2016   Hand pain 07/13/2016   Foot pain 07/13/2016   Elevated sed rate 07/13/2016   Open wound of toe 05/03/2016   Ataxic gait 05/02/2016   Risk for falls 05/02/2016   Tremor 05/02/2016   Vitamin D  deficiency 07/08/2015   Chest pain, unspecified 06/23/2015   Mood disorder (HCC) 06/17/2015   Essential hypertension 05/18/2015   Generalized anxiety disorder 05/12/2015   Acquired hypothyroidism 05/05/2015   Severe persistent asthma 04/30/2015   Other allergic rhinitis 04/30/2015   GERD (gastroesophageal reflux disease) 04/30/2015   Chronic kidney disease, stage 2 (mild) 03/02/2015   Obesity 03/01/2015   Chronic post-traumatic headache 02/17/2010   Depression, unspecified 02/17/2010   History of malignant neoplasm of kidney 02/17/2010   Hyperlipidemia associated with type 2 diabetes mellitus (HCC) 02/17/2010   Other amnesia 02/17/2010   Polyosteoarthritis, 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 Overview:  Normal pharmacologic MPS Formatting of this note might be different from the original. Normal pharmacologic MPS Formatting of this note might be different from  the original. Formatting of this note might be different from the original. Normal pharmacologic MPS Formatting of this note might be different from the original. Form   Chronic kidney disease (CKD), stage II (mild)    Chronic obstructive pulmonary disease (HCC) 08/27/2016   Chronic post-traumatic headache 02/17/2010   Current mild episode of major depressive disorder without prior episode (HCC) 12/12/2017   Dehydration    Depression, unspecified 02/17/2010   Diabetes 08/27/2016   Dyslipidemia 08/27/2016   Edema 05/10/2020   Elevated sed rate 07/13/2016   48 on 05/2016   Epistaxis 05/10/2020   Essential hypertension 05/18/2015   Fatty liver    Fibroma 05/10/2020   Fibromyalgia 08/27/2016   First degree AV block    Fluid overload 05/07/2020   Foot pain 07/13/2016   Generalized anxiety disorder 05/12/2015   GERD (gastroesophageal reflux disease)    Hand pain 07/13/2016   High risk medication use 07/18/2016   History of hiatal hernia    History of malignant neoplasm of kidney 02/17/2010   Formatting of this note might be different from the original. Formatting of this note might be different from the original. Right partial nephrectomy 2008 Formatting of this note might be different from the original. Overview:  Right partial nephrectomy 2008   History of renal cell carcinoma 08/27/2016   Right partial nephrectomy 2008   Hypercholesterolemia 05/10/2020   Formatting of this note might  be different from the original. Formatting of this note might be different from the original. 12/07/2014 - TC 151/Trig 199/HDL 47/LDL 64  Lp(a) 10 ( <69)   Hyperlipidemia associated with type 2 diabetes mellitus (HCC) 02/17/2010   Formatting of this note might be different from the original. Overview:  12/07/2014 - TC 151/Trig 199/HDL 47/LDL 64  Lp(a) 10 ( <69)  Overview:  12/07/2014 - TC 151/Trig 199/HDL 47/LDL 64  Lp(a) 10 ( <69) Formatting of this note might be different from the original. 12/07/2014 - TC  151/Trig 199/HDL 47/LDL 64  Lp(a) 10 ( <69)   Hypertrophy of tonsils 05/10/2020   Hypokalemia 05/11/2020   Hypothyroidism    Iron deficiency anemia secondary to blood loss (chronic) 05/10/2020   Kidney disease    Migraine, unspecified, not intractable, without status migrainosus 05/10/2020   Mood disorder (HCC) 06/17/2015   Obesity 03/01/2015   Open wound of toe 05/03/2016   Osteoarthritis    Other allergic rhinitis 04/30/2015   Other amnesia 02/17/2010   Other chronic diseases of tonsils and adenoids 05/10/2020   Other specified personal risk factors, not elsewhere classified 07/18/2016   Pain in right knee 08/28/2019   Rheumatoid arthritis (HCC)    Risk for falls 05/02/2016   Severe persistent asthma 04/30/2015   Sleep apnea 08/27/2016   On CPAP, 2 L oxygen   Sleep disturbance 02/17/2010   Spinal stenosis in cervical region 02/17/2010   Tremor    Vitamin D  deficiency 07/08/2015    Family History  Problem Relation Age of Onset   Asthma Mother    Lung cancer Mother    Asthma Father    Allergic rhinitis Father    COPD Father    Heart attack Father    Arthritis Father    Brain cancer Paternal Uncle    Asthma Maternal Grandmother    Brain cancer Paternal Grandmother    Breast cancer Cousin    Breast cancer Cousin    Breast cancer Cousin    Brain cancer Brother    Heart attack Brother    Past Surgical History:  Procedure Laterality Date   ANTERIOR CERVICAL DECOMP/DISCECTOMY FUSION N/A 10/06/2022   Procedure: C3-4, C4-5, C5-6 ANTERIOR CERVICAL DISCECTOMY FUSION, ALLOGRAFT, PLATE;  Surgeon: Barbarann Oneil BROCKS, MD;  Location: [facility] OR;  Service: Orthopedics;  Laterality: N/A;  Needs RNFA   APPENDECTOMY  1983   CESAREAN SECTION     2 times, 1986 & 1983   CHOLECYSTECTOMY  2009   COLONOSCOPY W/ POLYPECTOMY  2021   ELBOW SURGERY Right    ulner nerve release   ESOPHAGEAL DILATION  2021   KIDNEY SURGERY Right 2008   1/2 right kidney removed   pellet insertion  03/2021   per  patient   SHOULDER SURGERY Right 2006   TONSILLECTOMY  2011   TOTAL SHOULDER ARTHROPLASTY Right    TUBAL LIGATION  1986   WRIST SURGERY Right    ulner nerve release   Social History   Social History Narrative   Not on file   Immunization History  Administered Date(s) Administered   PFIZER(Purple Top)SARS-COV-2 Vaccination 11/18/2019, 12/09/2019, 08/05/2020     Objective: Vital Signs: BP 118/74 (BP Location: Right Arm, Patient Position: Sitting, Cuff Size: Normal)   Pulse 73   Resp 18   Ht 5' 1.5 (1.562 m)   Wt 210 lb 3.2 oz (95.3 kg)   BMI 39.07 kg/m    Physical Exam Vitals and nursing note reviewed.  Constitutional:  Appearance: She is well-developed.  HENT:     Head: Normocephalic and atraumatic.  Eyes:     Conjunctiva/sclera: Conjunctivae normal.  Cardiovascular:     Rate and Rhythm: Normal rate and regular rhythm.     Heart sounds: Normal heart sounds.  Pulmonary:     Effort: Pulmonary effort is normal.     Breath sounds: Normal breath sounds.  Abdominal:     General: Bowel sounds are normal.     Palpations: Abdomen is soft.  Musculoskeletal:     Cervical back: Normal range of motion.  Lymphadenopathy:     Cervical: No cervical adenopathy.  Skin:    General: Skin is warm and dry.     Capillary Refill: Capillary refill takes less than 2 seconds.  Neurological:     Mental Status: She is alert and oriented to person, place, and time.  Psychiatric:        Behavior: Behavior normal.      Musculoskeletal Exam: C-spine has limited ROM with lateral rotation.  Shoulder joints, elbow joints, wrist joints, MCPs, PIPs, and DIPs good ROM with no synovitis.  Complete fist formation bilaterally.  Hip joints have good ROM with no groin pain.  Knee joints have good ROM with no tenderness or joint swelling.  Ankle joints have good ROM with no tenderness or joint swelling. Tenderness over the left trochanteric bursa.    CDAI Exam: CDAI Score: -- Patient Global: --;  Provider Global: -- Swollen: --; Tender: -- Joint Exam 02/28/2024   No joint exam has been documented for this visit   There is currently no information documented on the homunculus. Go to the Rheumatology activity and complete the homunculus joint exam.  Investigation: No additional findings.  Imaging: No results found.  Recent Labs: Lab Results  Component Value Date   WBC 4.6 09/25/2023   HGB 12.9 09/25/2023   PLT 250 09/25/2023   NA 139 09/25/2023   K 4.3 09/25/2023   CL 108 09/25/2023   CO2 24 09/25/2023   GLUCOSE 111 (H) 09/25/2023   BUN 10 09/25/2023   CREATININE 1.05 (H) 09/25/2023   BILITOT 0.4 09/25/2023   ALKPHOS 81 10/04/2022   AST 16 09/25/2023   ALT 14 09/25/2023   PROT 6.8 09/25/2023   ALBUMIN 3.3 (L) 10/04/2022   CALCIUM 8.9 09/25/2023   GFRAA 79 11/03/2020   QFTBGOLD NEGATIVE 06/26/2017   QFTBGOLDPLUS NEGATIVE 12/21/2022    Speciality Comments: No specialty comments available.  Procedures:  Large Joint Inj: L greater trochanter on 02/28/2024 12:42 PM Indications: pain Details: 27 G 1.5 in needle, lateral approach  Arthrogram: No  Medications: 1.5 mL lidocaine  1 %; 40 mg triamcinolone  acetonide 40 MG/ML Aspirate: 0 mL Outcome: tolerated well, no immediate complications Procedure, treatment alternatives, risks and benefits explained, specific risks discussed. Consent was given by the patient. Immediately prior to procedure a time out was called to verify the correct patient, procedure, equipment, support staff and site/side marked as required. Patient was prepped and draped in the usual sterile fashion.     Allergies: Methotrexate , Advair diskus [fluticasone -salmeterol], Aspirin, Ciprofloxacin, Cephalexin, and Clonidine derivatives   Assessment / Plan:     Visit Diagnoses: Rheumatoid arthritis of multiple sites with negative rheumatoid factor : She has no joint tenderness or synovitis on examination today.  She has not had any signs or symptoms of  a rheumatoid arthritis flare.  She has clinically been doing well on Humira  40 mg subcutaneous injections every 14 days and Arava  20 mg  1 tablet by mouth daily.  She is tolerating combination therapy without any side effects and has not had any recent gaps in therapy.  No recent or recurrent infections.  No medication changes will be made at this time.  She was advised to notify us  if she develops signs or symptoms of a flare.  She will follow up in 5 months or sooner if needed.   High risk medication use - Humira  40 mg sq injections every 14 days (11/02/2016), Arava  20 mg by mouth daily (12/13/2016) (inadequate response to Enbrel , side effects from MTX).  CBC and CMP updated on 12/05/23. Orders for CBC and CMP released today.  Her next lab work will be due in October and every 3 months.  Lipid panel order released today.  TB gold negative on 12/21/22. Order for TB gold released today.  No recent or recurrent infections. Discussed the importance of holding humira  if she develops signs or symptoms of an infection and to resume once the infection has completely cleared.  - Plan: CBC with Differential/Platelet, Comprehensive metabolic panel with GFR, Lipid panel, QuantiFERON-TB Gold Plus  Screening for tuberculosis -Order for TB gold released today.  Plan: QuantiFERON-TB Gold Plus  Fibromyalgia: She has intermittent myalgias and muscle tenderness due to fibromyalgia.  She remains under the care of pain management.  Trochanteric bursitis of left hip: Patient presents today with ongoing discomfort in the left hip consistent with left trochanteric bursitis. No recent injury or fall. She has been experiencing nocturnal pain when laying on her side at night.  She has had interrupted sleep at night due to nocturnal pain.  Offered referral to physical therapy but she would like to proceed with a left trochanteric bursa cortisone injection.  She tolerated the procedure well.  Procedure note was completed above.   Aftercare was discussed.  She is advised to notify us  if her symptoms persist or worsen.  She is advised to monitor blood pressure and blood sugar closely following the cortisone injection today.  Primary insomnia: She has been experiencing interrupted sleep at night due to nocturnal pain in the left hip.  Positive ANA (antinuclear antibody): No clinical features of systemic lupus.   Osteopenia of multiple sites - DEXA updated on 12/12/21: AP spine BMD 0.883 with T score -1.5.  She is taking vitamin D  50,000 units once weekly.  Due to update DEXA April 2025--Future order for DEXA placed today.  Hx of fusion of cervical spine - Underwent C3-C4 C4-C5 C5-C6 anterior cervical discectomy fusion on 10/06/2022 performed by Dr. Barbarann. Limited ROM with lateral rotation.  No symptoms of radiculopathy at this time.  Dyslipidemia -Lipid panel obtained today plan: Lipid panel  Other medical conditions are listed as follows:  History of gastroesophageal reflux (GERD)  History of renal cell carcinoma  History of COPD  History of diabetes mellitus  History of hypothyroidism  Vitamin D  deficiency  Chronic obstructive pulmonary disease, unspecified COPD type (HCC)  Orders: Orders Placed This Encounter  Procedures   Large Joint Inj   CBC with Differential/Platelet   Comprehensive metabolic panel with GFR   Lipid panel   QuantiFERON-TB Gold Plus   No orders of the defined types were placed in this encounter.    Follow-Up Instructions: Return in about 5 months (around 07/30/2024) for Rheumatoid arthritis, Fibromyalgia.   Waddell CHRISTELLA Craze, PA-C  Note - This record has been created using Dragon software.  Chart creation errors have been sought, but may not always  have been located.  Such creation errors do not reflect on  the standard of medical care.

## 2024-02-28 ENCOUNTER — Ambulatory Visit: Attending: Physician Assistant | Admitting: Physician Assistant

## 2024-02-28 ENCOUNTER — Encounter: Payer: Self-pay | Admitting: Physician Assistant

## 2024-02-28 ENCOUNTER — Telehealth: Payer: Self-pay | Admitting: Rheumatology

## 2024-02-28 VITALS — BP 118/74 | HR 73 | Resp 18 | Ht 61.5 in | Wt 210.2 lb

## 2024-02-28 DIAGNOSIS — Z8639 Personal history of other endocrine, nutritional and metabolic disease: Secondary | ICD-10-CM

## 2024-02-28 DIAGNOSIS — M0609 Rheumatoid arthritis without rheumatoid factor, multiple sites: Secondary | ICD-10-CM

## 2024-02-28 DIAGNOSIS — M7061 Trochanteric bursitis, right hip: Secondary | ICD-10-CM

## 2024-02-28 DIAGNOSIS — Z111 Encounter for screening for respiratory tuberculosis: Secondary | ICD-10-CM | POA: Diagnosis not present

## 2024-02-28 DIAGNOSIS — F5101 Primary insomnia: Secondary | ICD-10-CM

## 2024-02-28 DIAGNOSIS — Z981 Arthrodesis status: Secondary | ICD-10-CM

## 2024-02-28 DIAGNOSIS — E559 Vitamin D deficiency, unspecified: Secondary | ICD-10-CM

## 2024-02-28 DIAGNOSIS — M7062 Trochanteric bursitis, left hip: Secondary | ICD-10-CM

## 2024-02-28 DIAGNOSIS — Z85528 Personal history of other malignant neoplasm of kidney: Secondary | ICD-10-CM

## 2024-02-28 DIAGNOSIS — Z8719 Personal history of other diseases of the digestive system: Secondary | ICD-10-CM

## 2024-02-28 DIAGNOSIS — R768 Other specified abnormal immunological findings in serum: Secondary | ICD-10-CM

## 2024-02-28 DIAGNOSIS — E785 Hyperlipidemia, unspecified: Secondary | ICD-10-CM

## 2024-02-28 DIAGNOSIS — Z8709 Personal history of other diseases of the respiratory system: Secondary | ICD-10-CM

## 2024-02-28 DIAGNOSIS — M797 Fibromyalgia: Secondary | ICD-10-CM | POA: Diagnosis not present

## 2024-02-28 DIAGNOSIS — Z79899 Other long term (current) drug therapy: Secondary | ICD-10-CM | POA: Diagnosis not present

## 2024-02-28 DIAGNOSIS — J449 Chronic obstructive pulmonary disease, unspecified: Secondary | ICD-10-CM

## 2024-02-28 DIAGNOSIS — M8589 Other specified disorders of bone density and structure, multiple sites: Secondary | ICD-10-CM

## 2024-02-28 MED ORDER — LIDOCAINE HCL 1 % IJ SOLN
1.5000 mL | INTRAMUSCULAR | Status: AC | PRN
  Administered 2024-02-28: 1.5 mL

## 2024-02-28 MED ORDER — TRIAMCINOLONE ACETONIDE 40 MG/ML IJ SUSP
40.0000 mg | INTRAMUSCULAR | Status: AC | PRN
  Administered 2024-02-28: 40 mg via INTRA_ARTICULAR

## 2024-02-28 NOTE — Telephone Encounter (Signed)
 Attempted to contact the patient and left message to advise patient her previous DEXA scan was done at [facility].

## 2024-02-28 NOTE — Telephone Encounter (Signed)
 Patient states Kimberly Jenkins is referring her for a dexa scan and cannot remember where she had her last scan in 2023.  Patient requested a return call.

## 2024-02-28 NOTE — Patient Instructions (Signed)
 Standing Labs We placed an order today for your standing lab work.   Please have your standing labs drawn in October and every 3 months   Please have your labs drawn 2 weeks prior to your appointment so that the provider can discuss your lab results at your appointment, if possible.  Please note that you may see your imaging and lab results in MyChart before we have reviewed them. We will contact you once all results are reviewed. Please allow our office up to 72 hours to thoroughly review all of the results before contacting the office for clarification of your results.  WALK-IN LAB HOURS  Monday through Thursday from 8:00 am -12:30 pm and 1:00 pm-4:30 pm and Friday from 8:00 am-12:00 pm.  Patients with office visits requiring labs will be seen before walk-in labs.  You may encounter longer than normal wait times. Please allow additional time. Wait times may be shorter on  Monday and Thursday afternoons.  We do not book appointments for walk-in labs. We appreciate your patience and understanding with our staff.   Labs are drawn by [facility]. Please bring your co-pay at the time of your lab draw.  You may receive a bill from [facility] for your lab work.  Please note if you are on Hydroxychloroquine and and an order has been placed for a Hydroxychloroquine level,  you will need to have it drawn 4 hours or more after your last dose.  If you wish to have your labs drawn at another location, please call the office 24 hours in advance so we can fax the orders.  The office is located at [address], [place], [place] [address]   If you have any questions regarding directions or hours of operation,  please call [phone].   As a reminder, please drink plenty of water  prior to coming for your lab work. Thanks!

## 2024-03-02 ENCOUNTER — Ambulatory Visit: Payer: Self-pay | Admitting: Physician Assistant

## 2024-03-02 NOTE — Progress Notes (Signed)
 CBC WNL Creatinine remains elevated-1.17 and GFR is low-53.  Avoid the use of NSAIDs. Rest of CMP WNL Triglycerides elevated-166. Rest of lipid panel WNL. Please forward results to PCP

## 2024-03-03 LAB — CBC WITH DIFFERENTIAL/PLATELET
Absolute Lymphocytes: 2873 {cells}/uL (ref 850–3900)
Absolute Monocytes: 735 {cells}/uL (ref 200–950)
Basophils Absolute: 38 {cells}/uL (ref 0–200)
Basophils Relative: 0.5 %
Eosinophils Absolute: 53 {cells}/uL (ref 15–500)
Eosinophils Relative: 0.7 %
HCT: 38.1 % (ref 35.0–45.0)
Hemoglobin: 12.1 g/dL (ref 11.7–15.5)
MCH: 27.9 pg (ref 27.0–33.0)
MCHC: 31.8 g/dL — ABNORMAL LOW (ref 32.0–36.0)
MCV: 87.8 fL (ref 80.0–100.0)
MPV: 10.5 fL (ref 7.5–12.5)
Monocytes Relative: 9.8 %
Neutro Abs: 3803 {cells}/uL (ref 1500–7800)
Neutrophils Relative %: 50.7 %
Platelets: 230 Thousand/uL (ref 140–400)
RBC: 4.34 Million/uL (ref 3.80–5.10)
RDW: 14.3 % (ref 11.0–15.0)
Total Lymphocyte: 38.3 %
WBC: 7.5 Thousand/uL (ref 3.8–10.8)

## 2024-03-03 LAB — QUANTIFERON-TB GOLD PLUS
Mitogen-NIL: 9.74 [IU]/mL
NIL: 0.01 [IU]/mL
QuantiFERON-TB Gold Plus: NEGATIVE
TB1-NIL: 0 [IU]/mL
TB2-NIL: 0 [IU]/mL

## 2024-03-03 LAB — COMPREHENSIVE METABOLIC PANEL WITH GFR
AG Ratio: 1.4 (calc) (ref 1.0–2.5)
ALT: 14 U/L (ref 6–29)
AST: 13 U/L (ref 10–35)
Albumin: 4 g/dL (ref 3.6–5.1)
Alkaline phosphatase (APISO): 109 U/L (ref 37–153)
BUN/Creatinine Ratio: 12 (calc) (ref 6–22)
BUN: 14 mg/dL (ref 7–25)
CO2: 24 mmol/L (ref 20–32)
Calcium: 8.6 mg/dL (ref 8.6–10.4)
Chloride: 106 mmol/L (ref 98–110)
Creat: 1.17 mg/dL — ABNORMAL HIGH (ref 0.50–1.05)
Globulin: 2.9 g/dL (ref 1.9–3.7)
Glucose, Bld: 119 mg/dL (ref 65–139)
Potassium: 3.9 mmol/L (ref 3.5–5.3)
Sodium: 139 mmol/L (ref 135–146)
Total Bilirubin: 0.3 mg/dL (ref 0.2–1.2)
Total Protein: 6.9 g/dL (ref 6.1–8.1)
eGFR: 53 mL/min/1.73m2 — ABNORMAL LOW (ref >=60)

## 2024-03-03 LAB — LIPID PANEL
Cholesterol: 171 mg/dL (ref <200)
HDL: 54 mg/dL (ref >=50)
LDL Cholesterol (Calc): 91 mg/dL
Non-HDL Cholesterol (Calc): 117 mg/dL (ref <130)
Total CHOL/HDL Ratio: 3.2 (calc) (ref <5.0)
Triglycerides: 166 mg/dL — ABNORMAL HIGH (ref <150)

## 2024-03-03 NOTE — Progress Notes (Signed)
 TB gold negative

## 2024-03-05 ENCOUNTER — Other Ambulatory Visit: Payer: Self-pay | Admitting: Family Medicine

## 2024-03-05 ENCOUNTER — Encounter: Payer: Self-pay | Admitting: *Deleted

## 2024-03-05 DIAGNOSIS — Z1231 Encounter for screening mammogram for malignant neoplasm of breast: Secondary | ICD-10-CM

## 2024-03-18 ENCOUNTER — Ambulatory Visit

## 2024-03-20 ENCOUNTER — Ambulatory Visit (INDEPENDENT_AMBULATORY_CARE_PROVIDER_SITE_OTHER): Admitting: *Deleted

## 2024-03-20 DIAGNOSIS — J4550 Severe persistent asthma, uncomplicated: Secondary | ICD-10-CM

## 2024-03-24 ENCOUNTER — Other Ambulatory Visit: Payer: Self-pay

## 2024-03-24 ENCOUNTER — Other Ambulatory Visit: Payer: Self-pay | Admitting: Rheumatology

## 2024-03-24 MED ORDER — HUMIRA (2 PEN) 40 MG/0.8ML ~~LOC~~ AJKT
AUTO-INJECTOR | SUBCUTANEOUS | 0 refills | Status: DC
  Filled 2024-03-24: qty 6, fill #0
  Filled 2024-03-27: qty 2, 28d supply, fill #0
  Filled 2024-04-29: qty 2, 28d supply, fill #1
  Filled 2024-06-02: qty 2, 28d supply, fill #2

## 2024-03-24 NOTE — Telephone Encounter (Signed)
 Last Fill: 02/15/2024  Labs: 02/28/2024 CBC WNL Creatinine remains elevated-1.17 and GFR is low-53.  Avoid the use of NSAIDs. Rest of CMP WNL Triglycerides elevated-166. Rest of lipid panel WNL. Please forward results to PCP  TB Gold: 03/03/2024 TB Gold Negative    Next Visit: 07/30/2024  Last Visit: 02/28/2024  DX: Rheumatoid arthritis of multiple sites with negative rheumatoid factor   Current Dose per office note 02/28/2024: Humira  40 mg sq injections every 14 days   Okay to refill Humira ?

## 2024-03-25 LAB — HM DEXA SCAN

## 2024-03-26 ENCOUNTER — Ambulatory Visit
Admission: RE | Admit: 2024-03-26 | Discharge: 2024-03-26 | Disposition: A | Discharge status: Home / Self Care | Source: Ambulatory Visit | Attending: Family Medicine | Admitting: Family Medicine

## 2024-03-26 DIAGNOSIS — Z1231 Encounter for screening mammogram for malignant neoplasm of breast: Secondary | ICD-10-CM

## 2024-03-27 ENCOUNTER — Other Ambulatory Visit: Payer: Self-pay

## 2024-03-27 ENCOUNTER — Other Ambulatory Visit (HOSPITAL_COMMUNITY): Payer: Self-pay

## 2024-03-27 NOTE — Progress Notes (Signed)
 Specialty Pharmacy Refill Coordination Note  Spoke with Kimberly Jenkins is a 58 y.o. female contacted today regarding refills of specialty medication(s) Adalimumab  (Humira  (2 Pen))  Doses on hand: 1 for 03/28/24  Injection date: 04/11/24   Patient requested: Delivery   Delivery date: 04/04/24   Verified address: [address] [place] [address]  Medication will be filled on 04/03/24.

## 2024-04-03 ENCOUNTER — Telehealth: Payer: Self-pay

## 2024-04-03 NOTE — Telephone Encounter (Signed)
 Received DEXA results from [facility].  Date of Scan: 03/25/2024  Lowest T-score:-1.8  BMD:0.654  Lowest site measured:Right Femoral Neck  DX: Osteopenia  Significant changes in BMD and site measured (5% and above):N/A  Current Regimen:Vitamin D   Recommendation:Discuss at follow up   Reviewed by:Dr. Dolphus  Next Appointment:  07/30/2024

## 2024-04-14 NOTE — Telephone Encounter (Signed)
 Attempted to contact the patient and left a message to call the office back. Per DEXA results note on 04/03/2024, planned to discuss results at the patient's follow up.

## 2024-04-15 ENCOUNTER — Encounter: Payer: Self-pay | Admitting: Physician Assistant

## 2024-04-17 ENCOUNTER — Ambulatory Visit

## 2024-04-21 ENCOUNTER — Ambulatory Visit

## 2024-04-22 ENCOUNTER — Ambulatory Visit (INDEPENDENT_AMBULATORY_CARE_PROVIDER_SITE_OTHER)

## 2024-04-22 DIAGNOSIS — J4550 Severe persistent asthma, uncomplicated: Secondary | ICD-10-CM | POA: Diagnosis not present

## 2024-04-24 ENCOUNTER — Other Ambulatory Visit: Payer: Self-pay

## 2024-04-29 ENCOUNTER — Other Ambulatory Visit: Payer: Self-pay

## 2024-04-29 ENCOUNTER — Other Ambulatory Visit (HOSPITAL_COMMUNITY): Payer: Self-pay

## 2024-04-29 NOTE — Progress Notes (Signed)
 Specialty Pharmacy Refill Coordination Note  Spoke with Kimberly Jenkins is a 58 y.o. female contacted today regarding refills of specialty medication(s) Adalimumab  (Humira  (2 Pen))  Doses on hand: 1 for 05/02/24  Injection date: 05/16/24   Patient requested: Delivery   Delivery date: 05/09/24   Verified address: [address] [place] [address]  Medication will be filled on 05/08/24.

## 2024-05-01 ENCOUNTER — Other Ambulatory Visit: Payer: Self-pay

## 2024-05-02 ENCOUNTER — Other Ambulatory Visit: Payer: Self-pay | Admitting: Allergy and Immunology

## 2024-05-07 ENCOUNTER — Ambulatory Visit: Admitting: Allergy and Immunology

## 2024-05-08 ENCOUNTER — Other Ambulatory Visit: Payer: Self-pay

## 2024-05-13 ENCOUNTER — Other Ambulatory Visit (HOSPITAL_COMMUNITY): Payer: Self-pay

## 2024-05-14 ENCOUNTER — Ambulatory Visit (INDEPENDENT_AMBULATORY_CARE_PROVIDER_SITE_OTHER): Admitting: Allergy and Immunology

## 2024-05-14 ENCOUNTER — Encounter: Payer: Self-pay | Admitting: Allergy and Immunology

## 2024-05-14 VITALS — BP 122/70 | HR 80 | Resp 16

## 2024-05-14 DIAGNOSIS — J4550 Severe persistent asthma, uncomplicated: Secondary | ICD-10-CM | POA: Diagnosis not present

## 2024-05-14 DIAGNOSIS — D849 Immunodeficiency, unspecified: Secondary | ICD-10-CM

## 2024-05-14 DIAGNOSIS — K219 Gastro-esophageal reflux disease without esophagitis: Secondary | ICD-10-CM | POA: Diagnosis not present

## 2024-05-14 DIAGNOSIS — B370 Candidal stomatitis: Secondary | ICD-10-CM | POA: Diagnosis not present

## 2024-05-14 DIAGNOSIS — J3089 Other allergic rhinitis: Secondary | ICD-10-CM

## 2024-05-14 MED ORDER — SPIRIVA RESPIMAT 1.25 MCG/ACT IN AERS: INHALATION_SPRAY | RESPIRATORY_TRACT | 5 refills | Status: AC

## 2024-05-14 MED ORDER — IPRATROPIUM-ALBUTEROL 20-100 MCG/ACT IN AERS: 1.0000 | INHALATION_SPRAY | Freq: Four times a day (QID) | RESPIRATORY_TRACT | 1 refills | Status: AC | PRN

## 2024-05-14 NOTE — Patient Instructions (Signed)
  1. Continue Breo 200 - 1 inhalation +  Spiriva  1.25 Respimat - 2 inhalations daily   2. Continue Mepolizumab  injections every 4 weeks   3. Continue Montelukast  10 mg - 1 tablet one time per day  4. Continue Flonase  - 1-2 sprays each nostril 1 time per day  5. Continue Omeprazole 40 mg - 1 tablet two times per day + Pepcid  20 mg in evening  6. If needed:  A. Nystatin  oral solution - 5 mls swish and spit after BREO / Spiriva  use B. Duoneb / Albuterol  / Combivent Respimat 2 puffs every 4-6 hours C. Zyrtec   7. Return to clinic in 6 months or earlier if problem  8.  Influenza = Tamiflu.  COVID = Paxlovid.  9. Plan for fall flu vaccine and RSV vaccine

## 2024-05-14 NOTE — Progress Notes (Unsigned)
  - [place] - [place] - [place] - [place]   Follow-up Note  Referring Provider: Trudy Wanda MATSU, * Primary Provider: Neu, Ellen Louisa, NP Date of Office Visit: 05/14/2024  Subjective:   Kimberly Jenkins (DOB: 01-31-1966) is a 58 y.o. female who [facility] in re-evaluation of the following:  HPI: Hisayo returns to this clinic in evaluation of asthma, allergic rhinitis, LPR, recurrent thrush, immunosuppression with her marrow for rheumatoid arthritis.  I last saw her in this clinic 07 November 2023.  She believes that her asthma is under very good control.  Her use of a short acting bronchodilator only revolves around exercise.  Otherwise, she does not use it in her rescue mode.  She has had very little problems with her upper airways.  She continues to use anti-inflammatory agents for airway including the use of anti-IL-5 biologic agent on a consistent basis.  She has not required a systemic steroid or an antibiotic for any type of airway issue.  She believes that her reflux has been under very good control while using omeprazole and famotidine .  She is now using an oral treatment for osteoporosis.  Allergies as of 05/14/2024       Reactions   Methotrexate  Diarrhea   Advair Diskus [fluticasone -salmeterol] Itching, Nausea And Vomiting   Aspirin Nausea And Vomiting   Ciprofloxacin Nausea And Vomiting   Cephalexin Nausea And Vomiting   Clonidine Derivatives Rash   Patches blistered skin        Medication List    ALIGN PO Take 1 tablet by mouth daily.   ammonium lactate 12 % cream Commonly known as: AMLACTIN Apply topically.   amphetamine -dextroamphetamine  15 MG tablet Commonly known as: ADDERALL Take 15 mg by mouth 2 (two) times daily.   Breo Ellipta  200-25 MCG/INH Aepb Generic drug: fluticasone  furoate-vilanterol Inhale one puff once daily   busPIRone  15 MG tablet Commonly known as: BUSPAR  Take  15 mg by mouth 3 (three) times daily.   butalbital-acetaminophen -caffeine 50-325-40 MG tablet Commonly known as: FIORICET Take 1 tablet by mouth.   cetirizine 10 MG tablet Commonly known as: ZYRTEC Take 10 mg by mouth daily.   clorazepate  3.75 MG tablet Commonly known as: TRANXENE  Take 3.75 mg by mouth in the morning, at noon, and at bedtime.   cyclobenzaprine  10 MG tablet Commonly known as: FLEXERIL  Take 10 mg by mouth at bedtime.   Enbrel  SureClick 50 MG/ML injection Generic drug: etanercept  Inject 50 mg into the skin.   Enulose 10 GM/15ML Soln Generic drug: lactulose (encephalopathy) as needed.   EPINEPHrine  0.3 mg/0.3 mL Soaj injection Commonly known as: EpiPen  2-Pak Use as directed for life-threatening allergic reaction.   famotidine  20 MG tablet Commonly known as: PEPCID  Take 20 mg by mouth at bedtime.   fluconazole  150 MG tablet Commonly known as: DIFLUCAN  Take by mouth.   FLUoxetine  40 MG capsule Commonly known as: PROZAC  Take 80 mg by mouth daily.   fluticasone  50 MCG/ACT nasal spray Commonly known as: FLONASE  USE ONE TO TWO SPRAYS IN EACH NOSTRIL ONE DAILY AS DIRECTED.   FOSAMAX PO Take by mouth.   FreeStyle Libre 3 Plus Sensor Misc once a week.   furosemide  40 MG tablet Commonly known as: LASIX  Take 20 mg by mouth every other day.   gabapentin  600 MG tablet Commonly known as: NEURONTIN  Take 600 mg by mouth 3 (three) times daily.   Humira  (2 Pen) 40 MG/0.8ML Ajkt pen Generic drug:  adalimumab  INJECT 40 MG INTO THE SKIN EVERY 14 DAYS   icosapent  Ethyl 1 g capsule Commonly known as: VASCEPA  Take 1 g by mouth 2 (two) times daily.   Ipratropium-Albuterol  20-100 MCG/ACT Aers respimat Commonly known as: COMBIVENT Inhale 1 puff into the lungs every 6 (six) hours as needed for wheezing.   ipratropium-albuterol  0.5-2.5 (3) MG/3ML Soln Commonly known as: DUONEB CAN USE ONE VIAL IN NEBULIZER EVERY 4-6 HOURS AS NEEDED FOR COUGH OR WHEEZE    lactulose 10 GM/15ML solution Commonly known as: CHRONULAC as needed.   Lantus  SoloStar 100 UNIT/ML Solostar Pen Generic drug: insulin  glargine Inject 45 Units into the skin at bedtime. Hold if blood sugar is <130   leflunomide  20 MG tablet Commonly known as: ARAVA  Take 1 tablet (20 mg total) by mouth daily.   levothyroxine  50 MCG tablet Commonly known as: SYNTHROID  Take 50 mcg by mouth daily before breakfast.   Linzess  72 MCG capsule Generic drug: linaclotide  Take 72 mcg by mouth daily as needed (constipation).   mesalamine  1.2 g EC tablet Commonly known as: LIALDA  Take 2.4 g by mouth daily.   montelukast  10 MG tablet Commonly known as: SINGULAIR  Take 10 mg by mouth at bedtime.   Mounjaro 7.5 MG/0.5ML Pen Generic drug: tirzepatide SMARTSIG:1 pre-filled pen syringe SUB-Q Once a Week   naloxone  4 MG/0.1ML Liqd nasal spray kit Commonly known as: NARCAN  Place 1 spray into the nose as directed.   Nucala  100 MG/ML Sosy Generic drug: mepolizumab  Inject 100 mg into the skin every 28 (twenty-eight) days.   nystatin  100000 UNIT/ML suspension Commonly known as: MYCOSTATIN  Swish and spit with 5 mls after Breo/Spiriva  use.   nystatin  cream Commonly known as: MYCOSTATIN  Apply topically.   olopatadine  0.1 % ophthalmic solution Commonly known as: PATANOL CAN USE ONE DROP IN EACH EYE TWICE DAILY IF NEEDED FOR RED, ITCHY, WATERY EYES.   omeprazole 40 MG capsule Commonly known as: PRILOSEC Take 40 mg by mouth 2 (two) times daily.   ondansetron  8 MG tablet Commonly known as: ZOFRAN  Take 8 mg by mouth every 8 (eight) hours as needed for nausea or vomiting.   Oxycodone  HCl 10 MG Tabs Take 10 mg by mouth every 8 (eight) hours.   potassium chloride  SA 20 MEQ tablet Commonly known as: KLOR-CON  M Take 20 mEq by mouth 2 (two) times daily.   pramipexole  0.125 MG tablet Commonly known as: MIRAPEX  Take 0.125 mg by mouth at bedtime.   pravastatin  40 MG tablet Commonly  known as: PRAVACHOL  Take 40 mg by mouth daily.   primidone  250 MG tablet Commonly known as: MYSOLINE  Take 250 mg by mouth at bedtime.   promethazine -dextromethorphan 6.25-15 MG/5ML syrup Commonly known as: PROMETHAZINE -DM Take 5 mLs by mouth 4 (four) times daily as needed for cough.   Rexulti  3 MG Tabs Generic drug: Brexpiprazole  Take 1 tablet by mouth daily.   Spiriva  Respimat 1.25 MCG/ACT Aers Generic drug: Tiotropium Bromide Monohydrate  Inhale two puffs once daily   topiramate  50 MG tablet Commonly known as: TOPAMAX  Take 50 mg by mouth 2 (two) times daily.   triamcinolone  cream 0.1 % Commonly known as: KENALOG  Apply 1 application  topically daily as needed (cracks at the corners of her mouth).   Vitamin D  (Ergocalciferol ) 1.25 MG (50000 UNIT) Caps capsule Commonly known as: DRISDOL  Take 50,000 Units by mouth every Sunday.    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 Overview:  Normal pharmacologic MPS Formatting of this note might be different from the original. Normal pharmacologic MPS Formatting of this note might be different from the original. Formatting of this note might be different from the original. Normal pharmacologic MPS Formatting of this note might be different from the original. Form   Chronic kidney disease (CKD), stage II (mild)    Chronic obstructive pulmonary disease (HCC) 08/27/2016   Chronic post-traumatic headache 02/17/2010   Current mild episode of major depressive disorder without prior episode (HCC) 12/12/2017   Dehydration    Depression, unspecified  02/17/2010   Diabetes 08/27/2016   Dyslipidemia 08/27/2016   Edema 05/10/2020   Elevated sed rate 07/13/2016   48 on 05/2016   Epistaxis 05/10/2020   Essential hypertension 05/18/2015   Fatty liver    Fibroma 05/10/2020   Fibromyalgia 08/27/2016   First degree AV block    Fluid overload 05/07/2020   Foot pain 07/13/2016   Generalized anxiety disorder 05/12/2015   GERD (gastroesophageal reflux disease)    Hand pain 07/13/2016   High risk medication use 07/18/2016   History of hiatal hernia    History of malignant neoplasm of kidney 02/17/2010   Formatting of this note might be different from the original. Formatting of this note might be different from the original. Right partial nephrectomy 2008 Formatting of this note might be different from the original. Overview:  Right partial nephrectomy 2008   History of renal cell carcinoma 08/27/2016   Right partial nephrectomy 2008   Hypercholesterolemia 05/10/2020   Formatting of this note might be different from the original. Formatting of this note might be different from the original. 12/07/2014 - TC 151/Trig 199/HDL 47/LDL 64  Lp(a) 10 ( <69)   Hyperlipidemia associated with type 2 diabetes mellitus (HCC) 02/17/2010   Formatting of this note might be different from the original. Overview:  12/07/2014 - TC 151/Trig 199/HDL 47/LDL 64  Lp(a) 10 ( <69)  Overview:  12/07/2014 - TC 151/Trig 199/HDL 47/LDL 64  Lp(a) 10 ( <69) Formatting of this note might be different from the original. 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   Kidney disease    Migraine, unspecified, not intractable, without status migrainosus 05/10/2020   Mood disorder (HCC) 06/17/2015   Obesity 03/01/2015   Open wound of toe 05/03/2016   Osteoarthritis    Other allergic rhinitis 04/30/2015   Other amnesia  02/17/2010   Other chronic diseases of tonsils and adenoids 05/10/2020   Other specified personal risk factors, not elsewhere classified 07/18/2016   Pain in right knee 08/28/2019   Rheumatoid arthritis (HCC)    Risk for falls 05/02/2016   Severe persistent asthma 04/30/2015   Sleep apnea 08/27/2016   On CPAP, 2 L oxygen   Sleep disturbance 02/17/2010   Spinal stenosis in cervical region 02/17/2010   Tremor    Vitamin D  deficiency 07/08/2015    Past Surgical History:  Procedure Laterality Date   ANTERIOR CERVICAL DECOMP/DISCECTOMY  FUSION N/A 10/06/2022   Procedure: C3-4, C4-5, C5-6 ANTERIOR CERVICAL DISCECTOMY FUSION, ALLOGRAFT, PLATE;  Surgeon: Barbarann Oneil BROCKS, MD;  Location: [facility] OR;  Service: Orthopedics;  Laterality: N/A;  Needs RNFA   APPENDECTOMY  1983   CESAREAN SECTION     2 times, 1986 & 1983   CHOLECYSTECTOMY  2009   COLONOSCOPY W/ POLYPECTOMY  2021   ELBOW SURGERY Right    ulner nerve release   ESOPHAGEAL DILATION  2021   KIDNEY SURGERY Right 2008   1/2 right kidney removed   pellet insertion  03/2021   per patient   SHOULDER SURGERY Right 2006   TONSILLECTOMY  2011   TOTAL SHOULDER ARTHROPLASTY Right    TUBAL LIGATION  1986   WRIST SURGERY Right    ulner nerve release    Review of systems negative except as noted in HPI / PMHx or noted below:  Review of Systems  Constitutional: Negative.   HENT: Negative.    Eyes: Negative.   Respiratory: Negative.    Cardiovascular: Negative.   Gastrointestinal: Negative.   Genitourinary: Negative.   Musculoskeletal: Negative.   Skin: Negative.   Neurological: Negative.   Endo/Heme/Allergies: Negative.   Psychiatric/Behavioral: Negative.       Objective:   Vitals:   05/14/24 1001  BP: 122/70  Pulse: 80  Resp: 16  SpO2: 99%          Physical Exam Constitutional:      Appearance: She is not diaphoretic.  HENT:     Head: Normocephalic.     Right Ear: Tympanic membrane, ear canal and external ear normal.      Left Ear: Tympanic membrane, ear canal and external ear normal.     Nose: Nose normal. No mucosal edema or rhinorrhea.     Mouth/Throat:     Pharynx: Uvula midline. No oropharyngeal exudate.  Eyes:     Conjunctiva/sclera: Conjunctivae normal.  Neck:     Thyroid: No thyromegaly.     Trachea: Trachea normal. No tracheal tenderness or tracheal deviation.  Cardiovascular:     Rate and Rhythm: Normal rate and regular rhythm.     Heart sounds: Normal heart sounds, S1 normal and S2 normal. No murmur heard. Pulmonary:     Effort: No respiratory distress.     Breath sounds: Normal breath sounds. No stridor. No wheezing or rales.  Lymphadenopathy:     Head:     Right side of head: No tonsillar adenopathy.     Left side of head: No tonsillar adenopathy.     Cervical: No cervical adenopathy.  Skin:    Findings: No erythema or rash.     Nails: There is no clubbing.  Neurological:     Mental Status: She is alert.     Diagnostics: Spirometry was performed and demonstrated an FEV1 of 1.13 at 49 % of predicted.  Assessment and Plan:   1. Asthma, severe persistent, well-controlled   2. Other allergic rhinitis   3. LPRD (laryngopharyngeal reflux disease)   4. Thrush   5. Immunosuppression (HCC)    1. Continue Breo 200 - 1 inhalation +  Spiriva  1.25 Respimat - 2 inhalations daily   2. Continue Mepolizumab  injections every 4 weeks   3. Continue Montelukast  10 mg - 1 tablet one time per day  4. Continue Flonase  - 1-2 sprays each nostril 1 time per day  5. Continue Omeprazole 40 mg - 1 tablet two times per day + Pepcid  20 mg in evening  6. If needed:  A. Nystatin  oral solution - 5 mls swish and spit after BREO / Spiriva  use B. Duoneb / Albuterol  / Combivent Respimat 2 puffs every 4-6 hours C. Zyrtec   7. Return to clinic in 6 months or earlier if problem  8.  Influenza = Tamiflu.  COVID = Paxlovid.  9. Plan for fall flu vaccine and RSV vaccine  Overall [name] appears to be doing  pretty well on a large collection of therapy directed against respiratory tract inflammation including use of mepolizumab .  And her reflux appears to be under good control while using a combination of proton pump inhibitor and H2 receptor blocker.  Assuming she continues to do well with this plan I will see her back in this clinic in 6 months or earlier if there is a problem.   Camellia Denis, MD Allergy / Immunology [facility]

## 2024-05-15 ENCOUNTER — Encounter: Payer: Self-pay | Admitting: Allergy and Immunology

## 2024-05-20 ENCOUNTER — Ambulatory Visit

## 2024-05-21 ENCOUNTER — Ambulatory Visit

## 2024-05-21 DIAGNOSIS — J4550 Severe persistent asthma, uncomplicated: Secondary | ICD-10-CM | POA: Diagnosis not present

## 2024-05-30 ENCOUNTER — Encounter (INDEPENDENT_AMBULATORY_CARE_PROVIDER_SITE_OTHER): Payer: Self-pay

## 2024-06-02 ENCOUNTER — Other Ambulatory Visit: Payer: Self-pay

## 2024-06-02 ENCOUNTER — Other Ambulatory Visit (HOSPITAL_COMMUNITY): Payer: Self-pay

## 2024-06-02 NOTE — Progress Notes (Signed)
 Specialty Pharmacy Refill Coordination Note  MyChart Questionnaire Submission  Kimberly Jenkins is a 58 y.o. female contacted today regarding refills of specialty medication(s) Humira .  Doses on hand: 1 for 06/02/24  Injection date: 06/16/24  Patient requested: Delivery   Delivery date: 06/12/24  Verified address: [address] [place] [address]  Medication will be filled on 06/11/24.

## 2024-06-11 ENCOUNTER — Other Ambulatory Visit: Payer: Self-pay

## 2024-06-18 ENCOUNTER — Ambulatory Visit

## 2024-06-18 DIAGNOSIS — J4550 Severe persistent asthma, uncomplicated: Secondary | ICD-10-CM | POA: Diagnosis not present

## 2024-07-02 ENCOUNTER — Other Ambulatory Visit: Payer: Self-pay

## 2024-07-02 ENCOUNTER — Other Ambulatory Visit (HOSPITAL_COMMUNITY): Payer: Self-pay

## 2024-07-02 ENCOUNTER — Other Ambulatory Visit: Payer: Self-pay | Admitting: Physician Assistant

## 2024-07-02 MED ORDER — HUMIRA (2 PEN) 40 MG/0.8ML ~~LOC~~ AJKT
AUTO-INJECTOR | SUBCUTANEOUS | 0 refills | Status: AC
  Filled 2024-07-02: qty 6, fill #0
  Filled 2024-07-04: qty 2, 28d supply, fill #0
  Filled 2024-08-04: qty 2, 28d supply, fill #1
  Filled 2024-09-01: qty 2, 28d supply, fill #2

## 2024-07-02 NOTE — Telephone Encounter (Signed)
 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

## 2024-07-04 ENCOUNTER — Other Ambulatory Visit: Payer: Self-pay

## 2024-07-04 ENCOUNTER — Other Ambulatory Visit (HOSPITAL_COMMUNITY): Payer: Self-pay

## 2024-07-04 NOTE — Progress Notes (Signed)
 Specialty Pharmacy Refill Coordination Note  Kimberly Jenkins is a 58 y.o. female contacted today regarding refills of specialty medication(s) Adalimumab  (Humira  (2 Pen))   Patient requested Delivery   Delivery date: 07/15/24   Verified address: [address] [place] [address]   Medication will be filled on: 07/14/24

## 2024-07-14 ENCOUNTER — Other Ambulatory Visit: Payer: Self-pay

## 2024-07-16 ENCOUNTER — Ambulatory Visit

## 2024-07-16 NOTE — Progress Notes (Deleted)
 Office Visit Note  Patient: Kimberly Jenkins             Date of Birth: [birth year]           MRN: [id number]             PCP: Servando Leeroy Ground, NP Referring: Trudy Wanda MATSU, * Visit Date: 07/30/2024 Occupation: Data Unavailable  Subjective:    History of Present Illness: Kimberly Jenkins is a 58 y.o. female with history of seronegative rheumatoid arthritis and fibromyalgia.  Patient remains on Humira  40 mg sq injections every 14 days (11/02/2016) and Arava  20 mg by mouth daily (12/13/2016).    CBC and CMP updated on 06/02/24. Her next lab work will be due in January and every 3 months.   TB gold negative on 02/28/24.  Discussed the importance of holding humira  and arava  if she develops signs or symptoms of an infection and to resume once the infection has completely cleared.      Activities of Daily Living:  Patient reports morning stiffness for *** {minute/hour:19697}.   Patient {ACTIONS;DENIES/REPORTS:21021675::Denies} nocturnal pain.  Difficulty dressing/grooming: {ACTIONS;DENIES/REPORTS:21021675::Denies} Difficulty climbing stairs: {ACTIONS;DENIES/REPORTS:21021675::Denies} Difficulty getting out of chair: {ACTIONS;DENIES/REPORTS:21021675::Denies} Difficulty using hands for taps, buttons, cutlery, and/or writing: {ACTIONS;DENIES/REPORTS:21021675::Denies}  No Rheumatology ROS completed.   PMFS History:  Patient Active Problem List   Diagnosis Date Noted   Protrusion of cervical intervertebral disc 07/19/2022   Herniation of cervical intervertebral disc with radiculopathy 07/19/2022   Other spondylosis with radiculopathy, cervical region 07/04/2022   Cancer of kidney (HCC) 07/11/2021   Dehydration 07/11/2021   Kidney disease 07/11/2021   Hypokalemia 05/11/2020   Costochondritis 05/10/2020   Edema 05/10/2020   Epistaxis 05/10/2020   Fibroma 05/10/2020   Hypercholesterolemia 05/10/2020   Iron deficiency anemia secondary to blood loss (chronic)  05/10/2020   Migraine, unspecified, not intractable, without status migrainosus 05/10/2020   Other chronic diseases of tonsils and adenoids 05/10/2020   Hypertrophy of tonsils 05/10/2020   Asthma 05/07/2020   Fluid overload 05/07/2020   Pain in right knee 08/28/2019   Carpal tunnel syndrome of right wrist 04/17/2019   Ulnar tunnel syndrome of right wrist 04/17/2019   Lesion of ulnar nerve, right upper limb 11/12/2018   Current mild episode of major depressive disorder without prior episode 12/12/2017   Abnormal weight loss 11/15/2017   Fibromyalgia 08/27/2016   History of right rotator cuff tear repair 08/27/2016   Diabetes 08/27/2016   Chronic obstructive pulmonary disease (HCC) 08/27/2016   Dyslipidemia 08/27/2016   History of renal cell carcinoma 08/27/2016   Anxiety 08/27/2016   Sleep apnea 08/27/2016   Rheumatoid arthritis of multiple sites with negative rheumatoid factor  07/18/2016   High risk medication use 07/18/2016   Other specified personal risk factors, not elsewhere classified 07/18/2016   Hand pain 07/13/2016   Foot pain 07/13/2016   Elevated sed rate 07/13/2016   Open wound of toe 05/03/2016   Ataxic gait 05/02/2016   Risk for falls 05/02/2016   Tremor 05/02/2016   Vitamin D  deficiency 07/08/2015   Chest pain, unspecified 06/23/2015   Mood disorder 06/17/2015   Essential hypertension 05/18/2015   Generalized anxiety disorder 05/12/2015   Acquired hypothyroidism 05/05/2015   Severe persistent asthma (HCC) 04/30/2015   Other allergic rhinitis 04/30/2015   GERD (gastroesophageal reflux disease) 04/30/2015   Chronic kidney disease, stage 2 (mild) 03/02/2015   Obesity 03/01/2015   Chronic post-traumatic headache 02/17/2010   Depression, unspecified 02/17/2010   History of malignant neoplasm  of kidney 02/17/2010   Hyperlipidemia associated with type 2 diabetes mellitus (HCC) 02/17/2010   Other amnesia 02/17/2010   Polyosteoarthritis, 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 Overview:  Normal pharmacologic MPS Formatting of this note might be different from the original. Normal pharmacologic MPS Formatting of this note might be different from the original. Formatting of this note might be different from the original. Normal pharmacologic MPS Formatting of this note might be different from the original. Form   Chronic kidney disease (CKD), stage II (mild)    Chronic obstructive pulmonary disease (HCC) 08/27/2016   Chronic post-traumatic headache 02/17/2010   Current mild episode of major depressive disorder without prior episode 12/12/2017   Dehydration    Depression, unspecified 02/17/2010   Diabetes 08/27/2016   Dyslipidemia 08/27/2016   Edema 05/10/2020   Elevated sed rate 07/13/2016   48 on 05/2016   Epistaxis 05/10/2020   Essential hypertension 05/18/2015   Fatty liver    Fibroma 05/10/2020   Fibromyalgia 08/27/2016   First degree AV block    Fluid overload 05/07/2020   Foot pain 07/13/2016   Generalized anxiety disorder 05/12/2015   GERD (gastroesophageal reflux disease)    Hand pain 07/13/2016   High risk medication use 07/18/2016   History of hiatal hernia    History of malignant neoplasm of kidney 02/17/2010   Formatting of this note might be different from the original. Formatting of this note might be different from the original. Right partial nephrectomy 2008 Formatting of this note might be different from the original. Overview:   Right partial nephrectomy 2008   History of renal cell carcinoma 08/27/2016   Right partial nephrectomy 2008   Hypercholesterolemia 05/10/2020   Formatting of this note might be different from the original. Formatting of this note might be different from the original. 12/07/2014 - TC 151/Trig 199/HDL 47/LDL 64  Lp(a) 10 ( <69)   Hyperlipidemia associated with type 2 diabetes mellitus (HCC) 02/17/2010   Formatting of this note might be different from the original. Overview:  12/07/2014 - TC 151/Trig 199/HDL 47/LDL 64  Lp(a) 10 ( <69)  Overview:  12/07/2014 - TC 151/Trig 199/HDL 47/LDL 64  Lp(a) 10 ( <69) Formatting of this note might be different from the original. 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   Kidney disease    Migraine, unspecified, not intractable, without status migrainosus 05/10/2020   Mood disorder 06/17/2015   Obesity 03/01/2015   Open wound of toe 05/03/2016   Osteoarthritis    Other allergic rhinitis 04/30/2015   Other amnesia 02/17/2010   Other chronic diseases of tonsils and adenoids 05/10/2020   Other specified personal risk factors, not elsewhere classified 07/18/2016   Pain in right knee 08/28/2019   Rheumatoid arthritis (HCC)    Risk  for falls 05/02/2016   Severe persistent asthma 04/30/2015   Sleep apnea 08/27/2016   On CPAP, 2 L oxygen   Sleep disturbance 02/17/2010   Spinal stenosis in cervical region 02/17/2010   Tremor    Vitamin D  deficiency 07/08/2015    Family History  Problem Relation Age of Onset   Asthma Mother    Lung cancer Mother    Asthma Father    Allergic rhinitis Father    COPD Father    Heart attack Father    Arthritis Father    Brain cancer Paternal Uncle    Asthma Maternal Grandmother    Brain cancer Paternal Grandmother    Breast cancer Cousin    Breast cancer  Cousin    Breast cancer Cousin    Brain cancer Brother    Heart attack Brother    Past Surgical History:  Procedure Laterality Date   ANTERIOR CERVICAL DECOMP/DISCECTOMY FUSION N/A 10/06/2022   Procedure: C3-4, C4-5, C5-6 ANTERIOR CERVICAL DISCECTOMY FUSION, ALLOGRAFT, PLATE;  Surgeon: Barbarann Oneil BROCKS, MD;  Location: [facility] OR;  Service: Orthopedics;  Laterality: N/A;  Needs RNFA   APPENDECTOMY  1983   CESAREAN SECTION     2 times, 1986 & 1983   CHOLECYSTECTOMY  2009   COLONOSCOPY W/ POLYPECTOMY  2021   ELBOW SURGERY Right    ulner nerve release   ESOPHAGEAL DILATION  2021   KIDNEY SURGERY Right 2008   1/2 right kidney removed   pellet insertion  03/2021   per patient   SHOULDER SURGERY Right 2006   TONSILLECTOMY  2011   TOTAL SHOULDER ARTHROPLASTY Right    TUBAL LIGATION  1986   WRIST SURGERY Right    ulner nerve release   Social History   Tobacco Use   Smoking status: Never    Passive exposure: Past   Smokeless tobacco: Never  Vaping Use   Vaping status: Never Used  Substance Use Topics   Alcohol use: No   Drug use: No   Social History   Social History Narrative   Not on file     Immunization History  Administered Date(s) Administered   PFIZER(Purple Top)SARS-COV-2 Vaccination 11/18/2019, 12/09/2019, 08/05/2020     Objective: Vital Signs: There were no vitals taken for this visit.   Physical Exam Vitals and nursing note reviewed.  Constitutional:      Appearance: She is well-developed.  HENT:     Head: Normocephalic and atraumatic.  Eyes:     Conjunctiva/sclera: Conjunctivae normal.  Cardiovascular:     Rate and Rhythm: Normal rate and regular rhythm.     Heart sounds: Normal heart sounds.  Pulmonary:     Effort: Pulmonary effort is normal.     Breath sounds: Normal breath sounds.  Abdominal:     General: Bowel sounds are normal.     Palpations: Abdomen is soft.  Musculoskeletal:     Cervical back: Normal range of motion.  Lymphadenopathy:      Cervical: No cervical adenopathy.  Skin:    General: Skin is warm and dry.     Capillary Refill: Capillary refill takes less than 2 seconds.  Neurological:     Mental Status: She is alert and oriented to person, place, and time.  Psychiatric:        Behavior: Behavior normal.      Musculoskeletal Exam: ***  CDAI Exam: CDAI Score: -- Patient Global: --; Provider Global: -- Swollen: --; Tender: -- Joint Exam 07/30/2024   No joint exam  has been documented for this visit   There is currently no information documented on the homunculus. Go to the Rheumatology activity and complete the homunculus joint exam.  Investigation: No additional findings.  Imaging: No results found.  Recent Labs: Lab Results  Component Value Date   WBC 7.5 02/28/2024   HGB 12.1 02/28/2024   PLT 230 02/28/2024   NA 139 02/28/2024   K 3.9 02/28/2024   CL 106 02/28/2024   CO2 24 02/28/2024   GLUCOSE 119 02/28/2024   BUN 14 02/28/2024   CREATININE 1.17 (H) 02/28/2024   BILITOT 0.3 02/28/2024   ALKPHOS 81 10/04/2022   AST 13 02/28/2024   ALT 14 02/28/2024   PROT 6.9 02/28/2024   ALBUMIN 3.3 (L) 10/04/2022   CALCIUM 8.6 02/28/2024   GFRAA 79 11/03/2020   QFTBGOLD NEGATIVE 06/26/2017   QFTBGOLDPLUS NEGATIVE 02/28/2024    Speciality Comments: No specialty comments available.  Procedures:  No procedures performed Allergies: Methotrexate , Advair diskus [fluticasone -salmeterol], Aspirin, Ciprofloxacin, Cephalexin, and Clonidine derivatives   Assessment / Plan:     Visit Diagnoses: Rheumatoid arthritis of multiple sites with negative rheumatoid factor   High risk medication use  Fibromyalgia  Trochanteric bursitis of left hip  Primary insomnia  Positive ANA (antinuclear antibody)  Osteopenia of multiple sites  Hx of fusion of cervical spine  History of gastroesophageal reflux (GERD)  History of renal cell carcinoma  History of COPD  History of diabetes mellitus  History of  hypothyroidism  Vitamin D  deficiency  Dyslipidemia  Orders: No orders of the defined types were placed in this encounter.  No orders of the defined types were placed in this encounter.   Face-to-face time spent with patient was *** minutes. Greater than 50% of time was spent in counseling and coordination of care.  Follow-Up Instructions: No follow-ups on file.   Waddell CHRISTELLA Craze, PA-C  Note - This record has been created using Dragon software.  Chart creation errors have been sought, but may not always  have been located. Such creation errors do not reflect on  the standard of medical care.

## 2024-07-30 ENCOUNTER — Ambulatory Visit: Admitting: Physician Assistant

## 2024-07-30 DIAGNOSIS — Z8639 Personal history of other endocrine, nutritional and metabolic disease: Secondary | ICD-10-CM

## 2024-07-30 DIAGNOSIS — F5101 Primary insomnia: Secondary | ICD-10-CM

## 2024-07-30 DIAGNOSIS — Z8719 Personal history of other diseases of the digestive system: Secondary | ICD-10-CM

## 2024-07-30 DIAGNOSIS — Z8709 Personal history of other diseases of the respiratory system: Secondary | ICD-10-CM

## 2024-07-30 DIAGNOSIS — M8589 Other specified disorders of bone density and structure, multiple sites: Secondary | ICD-10-CM

## 2024-07-30 DIAGNOSIS — R7689 Other specified abnormal immunological findings in serum: Secondary | ICD-10-CM

## 2024-07-30 DIAGNOSIS — M7062 Trochanteric bursitis, left hip: Secondary | ICD-10-CM

## 2024-07-30 DIAGNOSIS — E785 Hyperlipidemia, unspecified: Secondary | ICD-10-CM

## 2024-07-30 DIAGNOSIS — Z981 Arthrodesis status: Secondary | ICD-10-CM

## 2024-07-30 DIAGNOSIS — E559 Vitamin D deficiency, unspecified: Secondary | ICD-10-CM

## 2024-07-30 DIAGNOSIS — M797 Fibromyalgia: Secondary | ICD-10-CM

## 2024-07-30 DIAGNOSIS — Z85528 Personal history of other malignant neoplasm of kidney: Secondary | ICD-10-CM

## 2024-07-30 DIAGNOSIS — M0609 Rheumatoid arthritis without rheumatoid factor, multiple sites: Secondary | ICD-10-CM

## 2024-07-30 DIAGNOSIS — Z79899 Other long term (current) drug therapy: Secondary | ICD-10-CM

## 2024-07-30 NOTE — Progress Notes (Deleted)
 Office Visit Note  Patient: Kimberly Jenkins             Date of Birth: 11-09-1965           MRN: [id number]             PCP: Servando Leeroy Ground, NP Referring: Servando Leeroy Ground, NP Visit Date: 07/31/2024 Occupation: Data Unavailable  Subjective:  No chief complaint on file.   History of Present Illness: Kimberly Jenkins is a 58 y.o. female ***     Activities of Daily Living:  Patient reports morning stiffness for *** {minute/hour:19697}.   Patient {ACTIONS;DENIES/REPORTS:21021675::Denies} nocturnal pain.  Difficulty dressing/grooming: {ACTIONS;DENIES/REPORTS:21021675::Denies} Difficulty climbing stairs: {ACTIONS;DENIES/REPORTS:21021675::Denies} Difficulty getting out of chair: {ACTIONS;DENIES/REPORTS:21021675::Denies} Difficulty using hands for taps, buttons, cutlery, and/or writing: {ACTIONS;DENIES/REPORTS:21021675::Denies}  No Rheumatology ROS completed.   PMFS History:  Patient Active Problem List   Diagnosis Date Noted   Protrusion of cervical intervertebral disc 07/19/2022   Herniation of cervical intervertebral disc with radiculopathy 07/19/2022   Other spondylosis with radiculopathy, cervical region 07/04/2022   Cancer of kidney (HCC) 07/11/2021   Dehydration 07/11/2021   Kidney disease 07/11/2021   Hypokalemia 05/11/2020   Costochondritis 05/10/2020   Edema 05/10/2020   Epistaxis 05/10/2020   Fibroma 05/10/2020   Hypercholesterolemia 05/10/2020   Iron deficiency anemia secondary to blood loss (chronic) 05/10/2020   Migraine, unspecified, not intractable, without status migrainosus 05/10/2020   Other chronic diseases of tonsils and adenoids 05/10/2020   Hypertrophy of tonsils 05/10/2020   Asthma 05/07/2020   Fluid overload 05/07/2020   Pain in right knee 08/28/2019   Carpal tunnel syndrome of right wrist 04/17/2019   Ulnar tunnel syndrome of right wrist 04/17/2019   Lesion of ulnar nerve, right upper limb 11/12/2018   Current mild  episode of major depressive disorder without prior episode 12/12/2017   Abnormal weight loss 11/15/2017   Fibromyalgia 08/27/2016   History of right rotator cuff tear repair 08/27/2016   Diabetes 08/27/2016   Chronic obstructive pulmonary disease (HCC) 08/27/2016   Dyslipidemia 08/27/2016   History of renal cell carcinoma 08/27/2016   Anxiety 08/27/2016   Sleep apnea 08/27/2016   Rheumatoid arthritis of multiple sites with negative rheumatoid factor  07/18/2016   High risk medication use 07/18/2016   Other specified personal risk factors, not elsewhere classified 07/18/2016   Hand pain 07/13/2016   Foot pain 07/13/2016   Elevated sed rate 07/13/2016   Open wound of toe 05/03/2016   Ataxic gait 05/02/2016   Risk for falls 05/02/2016   Tremor 05/02/2016   Vitamin D  deficiency 07/08/2015   Chest pain, unspecified 06/23/2015   Mood disorder 06/17/2015   Essential hypertension 05/18/2015   Generalized anxiety disorder 05/12/2015   Acquired hypothyroidism 05/05/2015   Severe persistent asthma (HCC) 04/30/2015   Other allergic rhinitis 04/30/2015   GERD (gastroesophageal reflux disease) 04/30/2015   Chronic kidney disease, stage 2 (mild) 03/02/2015   Obesity 03/01/2015   Chronic post-traumatic headache 02/17/2010   Depression, unspecified 02/17/2010   History of malignant neoplasm of kidney 02/17/2010   Hyperlipidemia associated with type 2 diabetes mellitus (HCC) 02/17/2010   Other amnesia 02/17/2010   Polyosteoarthritis, 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 Overview:  Normal pharmacologic MPS Formatting of this note might be different from the original. Normal pharmacologic MPS Formatting of this note might be different from the original. Formatting of this note might be different from the original. Normal pharmacologic MPS Formatting of this note might be different from the original. Form   Chronic kidney disease (CKD), stage II (mild)    Chronic obstructive pulmonary disease (HCC) 08/27/2016   Chronic post-traumatic headache 02/17/2010   Current mild episode of major depressive disorder without prior episode 12/12/2017   Dehydration    Depression, unspecified 02/17/2010   Diabetes 08/27/2016   Dyslipidemia 08/27/2016   Edema 05/10/2020   Elevated sed rate 07/13/2016   48 on 05/2016   Epistaxis 05/10/2020   Essential hypertension 05/18/2015   Fatty liver    Fibroma 05/10/2020   Fibromyalgia 08/27/2016   First degree AV block    Fluid overload 05/07/2020   Foot pain 07/13/2016   Generalized anxiety disorder 05/12/2015   GERD (gastroesophageal reflux disease)    Hand pain 07/13/2016   High risk medication use 07/18/2016   History of hiatal hernia    History of malignant neoplasm of kidney 02/17/2010   Formatting of this note might be different from the original. Formatting of this note might be different from the original. Right partial nephrectomy 2008 Formatting of this note might be different from the original. Overview:  Right partial nephrectomy 2008   History of renal cell carcinoma 08/27/2016   Right partial nephrectomy 2008   Hypercholesterolemia 05/10/2020   Formatting of this note might be different from the original. Formatting of this note might be different from the original. 12/07/2014 - TC 151/Trig 199/HDL 47/LDL 64  Lp(a) 10 ( <69)   Hyperlipidemia associated with type 2 diabetes mellitus (HCC) 02/17/2010   Formatting of this note might  be different from the original. Overview:  12/07/2014 - TC 151/Trig 199/HDL 47/LDL 64  Lp(a) 10 ( <69)  Overview:  12/07/2014 - TC 151/Trig 199/HDL 47/LDL 64  Lp(a) 10 ( <69) Formatting of this note might be different from the original. 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   Kidney disease    Migraine, unspecified, not intractable, without status migrainosus 05/10/2020   Mood disorder 06/17/2015   Obesity 03/01/2015   Open wound of toe 05/03/2016   Osteoarthritis    Other allergic rhinitis 04/30/2015   Other amnesia 02/17/2010   Other chronic diseases of tonsils and adenoids 05/10/2020   Other specified personal risk factors, not elsewhere classified 07/18/2016   Pain in right knee 08/28/2019   Rheumatoid arthritis (HCC)    Risk for falls 05/02/2016   Severe persistent asthma 04/30/2015   Sleep apnea 08/27/2016   On CPAP, 2 L oxygen   Sleep disturbance 02/17/2010   Spinal stenosis in cervical region 02/17/2010   Tremor    Vitamin D  deficiency 07/08/2015    Family History  Problem Relation Age of Onset   Asthma Mother    Lung cancer Mother    Asthma Father    Allergic rhinitis Father    COPD  Father    Heart attack Father    Arthritis Father    Brain cancer Paternal Uncle    Asthma Maternal Grandmother    Brain cancer Paternal Grandmother    Breast cancer Cousin    Breast cancer Cousin    Breast cancer Cousin    Brain cancer Brother    Heart attack Brother    Past Surgical History:  Procedure Laterality Date   ANTERIOR CERVICAL DECOMP/DISCECTOMY FUSION N/A 10/06/2022   Procedure: C3-4, C4-5, C5-6 ANTERIOR CERVICAL DISCECTOMY FUSION, ALLOGRAFT, PLATE;  Surgeon: Barbarann Oneil BROCKS, MD;  Location: [facility] OR;  Service: Orthopedics;  Laterality: N/A;  Needs RNFA   APPENDECTOMY  1983   CESAREAN SECTION     2 times, 1986  & 1983   CHOLECYSTECTOMY  2009   COLONOSCOPY W/ POLYPECTOMY  2021   ELBOW SURGERY Right    ulner nerve release   ESOPHAGEAL DILATION  2021   KIDNEY SURGERY Right 2008   1/2 right kidney removed   pellet insertion  03/2021   per patient   SHOULDER SURGERY Right 2006   TONSILLECTOMY  2011   TOTAL SHOULDER ARTHROPLASTY Right    TUBAL LIGATION  1986   WRIST SURGERY Right    ulner nerve release   Social History   Tobacco Use   Smoking status: Never    Passive exposure: Past   Smokeless tobacco: Never  Vaping Use   Vaping status: Never Used  Substance Use Topics   Alcohol use: No   Drug use: No   Social History   Social History Narrative   Not on file     Immunization History  Administered Date(s) Administered   PFIZER(Purple Top)SARS-COV-2 Vaccination 11/18/2019, 12/09/2019, 08/05/2020     Objective: Vital Signs: There were no vitals taken for this visit.   Physical Exam   Musculoskeletal Exam: ***  CDAI Exam: CDAI Score: -- Patient Global: --; Provider Global: -- Swollen: --; Tender: -- Joint Exam 07/31/2024   No joint exam has been documented for this visit   There is currently no information documented on the homunculus. Go to the Rheumatology activity and complete the homunculus joint exam.  Investigation: No additional findings.  Imaging: No results found.  Recent Labs: Lab Results  Component Value Date   WBC 7.5 02/28/2024   HGB 12.1 02/28/2024   PLT 230 02/28/2024   NA 139 02/28/2024   K 3.9 02/28/2024   CL 106 02/28/2024   CO2 24 02/28/2024   GLUCOSE 119 02/28/2024   BUN 14 02/28/2024   CREATININE 1.17 (H) 02/28/2024   BILITOT 0.3 02/28/2024   ALKPHOS 81 10/04/2022   AST 13 02/28/2024   ALT 14 02/28/2024   PROT 6.9 02/28/2024   ALBUMIN 3.3 (L) 10/04/2022   CALCIUM 8.6 02/28/2024   GFRAA 79 11/03/2020   QFTBGOLD NEGATIVE 06/26/2017   QFTBGOLDPLUS NEGATIVE 02/28/2024    Speciality Comments: No specialty comments  available.  Procedures:  No procedures performed Allergies: Methotrexate , Advair diskus [fluticasone -salmeterol], Aspirin, Ciprofloxacin, Cephalexin, and Clonidine derivatives   Assessment / Plan:     Visit Diagnoses: No diagnosis found.  Orders: No orders of the defined types were placed in this encounter.  No orders of the defined types were placed in this encounter.   Face-to-face time spent with patient was *** minutes. Greater than 50% of time was spent in counseling and coordination of care.  Follow-Up Instructions: No follow-ups on file.   Daved BROCKS Gavel, CMA  Note - This record has  been created using [employer].  Chart creation errors have been sought, but may not always  have been located. Such creation errors do not reflect on  the standard of medical care.

## 2024-07-31 ENCOUNTER — Ambulatory Visit: Attending: Rheumatology | Admitting: Rheumatology

## 2024-07-31 DIAGNOSIS — J449 Chronic obstructive pulmonary disease, unspecified: Secondary | ICD-10-CM

## 2024-07-31 DIAGNOSIS — E785 Hyperlipidemia, unspecified: Secondary | ICD-10-CM

## 2024-07-31 DIAGNOSIS — Z8709 Personal history of other diseases of the respiratory system: Secondary | ICD-10-CM

## 2024-07-31 DIAGNOSIS — M0609 Rheumatoid arthritis without rheumatoid factor, multiple sites: Secondary | ICD-10-CM

## 2024-07-31 DIAGNOSIS — Z8719 Personal history of other diseases of the digestive system: Secondary | ICD-10-CM

## 2024-07-31 DIAGNOSIS — M8589 Other specified disorders of bone density and structure, multiple sites: Secondary | ICD-10-CM

## 2024-07-31 DIAGNOSIS — R7689 Other specified abnormal immunological findings in serum: Secondary | ICD-10-CM

## 2024-07-31 DIAGNOSIS — Z85528 Personal history of other malignant neoplasm of kidney: Secondary | ICD-10-CM

## 2024-07-31 DIAGNOSIS — F5101 Primary insomnia: Secondary | ICD-10-CM

## 2024-07-31 DIAGNOSIS — Z981 Arthrodesis status: Secondary | ICD-10-CM

## 2024-07-31 DIAGNOSIS — E559 Vitamin D deficiency, unspecified: Secondary | ICD-10-CM

## 2024-07-31 DIAGNOSIS — M797 Fibromyalgia: Secondary | ICD-10-CM

## 2024-07-31 DIAGNOSIS — Z8639 Personal history of other endocrine, nutritional and metabolic disease: Secondary | ICD-10-CM

## 2024-07-31 DIAGNOSIS — M7062 Trochanteric bursitis, left hip: Secondary | ICD-10-CM

## 2024-07-31 DIAGNOSIS — Z79899 Other long term (current) drug therapy: Secondary | ICD-10-CM

## 2024-08-04 ENCOUNTER — Other Ambulatory Visit: Payer: Self-pay

## 2024-08-05 ENCOUNTER — Other Ambulatory Visit: Payer: Self-pay

## 2024-08-06 ENCOUNTER — Other Ambulatory Visit (HOSPITAL_COMMUNITY): Payer: Self-pay

## 2024-08-07 ENCOUNTER — Other Ambulatory Visit: Payer: Self-pay | Admitting: Pharmacist

## 2024-08-07 ENCOUNTER — Other Ambulatory Visit: Payer: Self-pay

## 2024-08-07 NOTE — Progress Notes (Signed)
 Specialty Pharmacy Refill Coordination Note  Kimberly Jenkins is a 58 y.o. female contacted today regarding refills of specialty medication(s) Adalimumab  (Humira  (2 Pen))   Patient requested Delivery   Delivery date: 08/12/24   Verified address: [address]  [place] [address]   Medication will be filled on: 08/11/24

## 2024-08-07 NOTE — Progress Notes (Signed)
 Specialty Pharmacy Ongoing Clinical Assessment Note  Kimberly Jenkins is a 58 y.o. female who is being followed by the specialty pharmacy service for RxSp Rheumatoid Arthritis   Patient's specialty medication(s) reviewed today: Adalimumab  (Humira  (2 Pen))   Missed doses in the last 4 weeks: 0   Patient/Caregiver did not have any additional questions or concerns.   Therapeutic benefit summary: Patient is achieving benefit   Adverse events/side effects summary: No adverse events/side effects   Patient's therapy is appropriate to: Continue    Goals Addressed             This Visit's Progress    Minimize recurrence of flares   On track    Patient is on track. Patient will maintain adherence         Follow up: 12 months  Lyle LELON Chalk Specialty Pharmacist

## 2024-08-11 ENCOUNTER — Other Ambulatory Visit: Payer: Self-pay

## 2024-08-14 ENCOUNTER — Ambulatory Visit: Admitting: *Deleted

## 2024-08-14 DIAGNOSIS — J4550 Severe persistent asthma, uncomplicated: Secondary | ICD-10-CM

## 2024-08-26 ENCOUNTER — Other Ambulatory Visit (HOSPITAL_COMMUNITY): Payer: Self-pay

## 2024-09-01 ENCOUNTER — Other Ambulatory Visit: Payer: Self-pay

## 2024-09-03 ENCOUNTER — Other Ambulatory Visit (HOSPITAL_COMMUNITY): Payer: Self-pay

## 2024-09-03 ENCOUNTER — Other Ambulatory Visit: Payer: Self-pay

## 2024-09-03 NOTE — Progress Notes (Signed)
 Specialty Pharmacy Refill Coordination Note  Aubery Date is a 59 y.o. female contacted today regarding refills of specialty medication(s) Adalimumab  (Humira  (2 Pen))   Patient requested Delivery   Delivery date: 09/16/24   Verified address: [address]  [place] [address]   Medication will be filled on: 09/15/24

## 2024-09-11 ENCOUNTER — Ambulatory Visit

## 2024-09-25 ENCOUNTER — Telehealth: Payer: Self-pay

## 2024-09-25 ENCOUNTER — Other Ambulatory Visit (HOSPITAL_COMMUNITY): Payer: Self-pay

## 2024-09-25 DIAGNOSIS — Z79899 Other long term (current) drug therapy: Secondary | ICD-10-CM

## 2024-09-25 NOTE — Telephone Encounter (Addendum)
 Attempted to submit a Prior Authorization request to [facility] MEDICAID for HUMIRA  via CoverMyMeds, however questionnaire indicates that pt MUST transition to formulary alternative. Will change request to biosimilar adalimumab -aaty (unbranded Yuflyma ).  Key: [id number]

## 2024-09-25 NOTE — Telephone Encounter (Signed)
 Received notification from [facility] regarding a prior authorization for Adalimumab -aaty. Authorization has been APPROVED from 08/28/2024 to 08/27/2025. Approval letter sent to scan center.  Per test claim, copay for 28 days supply is $0.00  Patient can continue to fill through [facility]: [phone]   Authorization # [id number]

## 2024-09-25 NOTE — Telephone Encounter (Signed)
 Attempted to contact patient and left message to advise patient to call the office.   Patient needs labs and an appointment (encourage patient to keep appointment, she had no shows on 07/30/2024 and 07/31/2024, will not be able to reschedule if patient cancels or no shows).

## 2024-09-26 NOTE — Telephone Encounter (Signed)
 Attempted to contact the patient and left message for patient to call the office to schedule a follow up appointment and to advise she is due for labs.

## 2024-09-29 NOTE — Telephone Encounter (Signed)
 Attempted to contact the patient and left message for patient to call the office to schedule a follow up appointment and to advise she is due for labs.

## 2024-11-12 ENCOUNTER — Ambulatory Visit: Admitting: Allergy and Immunology
